# Patient Record
Sex: Female | Born: 1960 | Race: White | Hispanic: No | Smoking: Former smoker
Health system: Southern US, Community
[De-identification: ages and names within clinical notes are randomized; demographics above are authoritative.]

## PROBLEM LIST (undated history)

## (undated) DIAGNOSIS — R490 Dysphonia: Secondary | ICD-10-CM

## (undated) DIAGNOSIS — M1990 Unspecified osteoarthritis, unspecified site: Secondary | ICD-10-CM

## (undated) DIAGNOSIS — T7840XA Allergy, unspecified, initial encounter: Secondary | ICD-10-CM

## (undated) DIAGNOSIS — E119 Type 2 diabetes mellitus without complications: Secondary | ICD-10-CM

## (undated) DIAGNOSIS — I2510 Atherosclerotic heart disease of native coronary artery without angina pectoris: Secondary | ICD-10-CM

## (undated) DIAGNOSIS — C801 Malignant (primary) neoplasm, unspecified: Secondary | ICD-10-CM

## (undated) DIAGNOSIS — Z972 Presence of dental prosthetic device (complete) (partial): Secondary | ICD-10-CM

## (undated) DIAGNOSIS — M75111 Incomplete rotator cuff tear or rupture of right shoulder, not specified as traumatic: Secondary | ICD-10-CM

## (undated) DIAGNOSIS — I10 Essential (primary) hypertension: Secondary | ICD-10-CM

## (undated) DIAGNOSIS — H269 Unspecified cataract: Secondary | ICD-10-CM

## (undated) HISTORY — DX: Allergy, unspecified, initial encounter: T78.40XA

## (undated) HISTORY — PX: BREAST BIOPSY: SHX20

## (undated) HISTORY — DX: Type 2 diabetes mellitus without complications: E11.9

---
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
## 1979-11-02 HISTORY — PX: TUBAL LIGATION: SHX77

## 1992-11-01 HISTORY — PX: BUNIONECTOMY: SHX129

## 2002-09-26 ENCOUNTER — Other Ambulatory Visit: Admission: RE | Admit: 2002-09-26 | Discharge: 2002-09-26 | Payer: Self-pay | Admitting: Obstetrics and Gynecology

## 2002-11-01 HISTORY — PX: VAGINAL HYSTERECTOMY: SUR661

## 2003-03-05 ENCOUNTER — Observation Stay (HOSPITAL_COMMUNITY): Admission: RE | Admit: 2003-03-05 | Discharge: 2003-03-06 | Payer: Self-pay | Admitting: Obstetrics and Gynecology

## 2003-03-05 ENCOUNTER — Encounter (INDEPENDENT_AMBULATORY_CARE_PROVIDER_SITE_OTHER): Payer: Self-pay | Admitting: Specialist

## 2003-03-05 HISTORY — PX: TOTAL VAGINAL HYSTERECTOMY: SHX2548

## 2003-03-05 HISTORY — PX: PUBOVAGINAL SLING: SHX1035

## 2003-05-29 ENCOUNTER — Encounter: Admission: RE | Admit: 2003-05-29 | Discharge: 2003-05-29 | Payer: Self-pay | Admitting: General Surgery

## 2003-05-29 ENCOUNTER — Encounter (HOSPITAL_BASED_OUTPATIENT_CLINIC_OR_DEPARTMENT_OTHER): Payer: Self-pay | Admitting: General Surgery

## 2003-05-29 ENCOUNTER — Encounter (INDEPENDENT_AMBULATORY_CARE_PROVIDER_SITE_OTHER): Payer: Self-pay | Admitting: *Deleted

## 2006-07-21 ENCOUNTER — Other Ambulatory Visit: Admission: RE | Admit: 2006-07-21 | Discharge: 2006-07-21 | Payer: Self-pay | Admitting: Obstetrics and Gynecology

## 2008-05-22 ENCOUNTER — Ambulatory Visit (HOSPITAL_COMMUNITY): Admission: RE | Admit: 2008-05-22 | Discharge: 2008-05-22 | Payer: Self-pay | Admitting: Family Medicine

## 2008-11-08 ENCOUNTER — Ambulatory Visit (HOSPITAL_COMMUNITY): Admission: RE | Admit: 2008-11-08 | Discharge: 2008-11-08 | Payer: Self-pay | Admitting: Obstetrics and Gynecology

## 2009-09-02 ENCOUNTER — Emergency Department (HOSPITAL_COMMUNITY): Admission: EM | Admit: 2009-09-02 | Discharge: 2009-09-02 | Payer: Self-pay | Admitting: Family Medicine

## 2009-11-17 ENCOUNTER — Ambulatory Visit (HOSPITAL_COMMUNITY): Admission: RE | Admit: 2009-11-17 | Discharge: 2009-11-17 | Payer: Self-pay | Admitting: Obstetrics and Gynecology

## 2010-08-01 ENCOUNTER — Ambulatory Visit (HOSPITAL_COMMUNITY): Admission: RE | Admit: 2010-08-01 | Discharge: 2010-08-01 | Payer: Self-pay | Admitting: Family Medicine

## 2010-11-24 ENCOUNTER — Other Ambulatory Visit (HOSPITAL_COMMUNITY): Payer: Self-pay

## 2010-11-24 ENCOUNTER — Other Ambulatory Visit (HOSPITAL_COMMUNITY): Payer: Self-pay | Admitting: Family Medicine

## 2010-11-24 DIAGNOSIS — Z139 Encounter for screening, unspecified: Secondary | ICD-10-CM

## 2010-11-24 DIAGNOSIS — Z1231 Encounter for screening mammogram for malignant neoplasm of breast: Secondary | ICD-10-CM

## 2010-12-11 ENCOUNTER — Emergency Department (HOSPITAL_COMMUNITY)
Admission: EM | Admit: 2010-12-11 | Discharge: 2010-12-12 | Disposition: A | Discharge status: Home / Self Care | Payer: Self-pay | Attending: Emergency Medicine | Admitting: Emergency Medicine

## 2010-12-11 DIAGNOSIS — L0231 Cutaneous abscess of buttock: Secondary | ICD-10-CM | POA: Insufficient documentation

## 2010-12-11 DIAGNOSIS — I10 Essential (primary) hypertension: Secondary | ICD-10-CM | POA: Insufficient documentation

## 2010-12-11 DIAGNOSIS — M129 Arthropathy, unspecified: Secondary | ICD-10-CM | POA: Insufficient documentation

## 2010-12-16 ENCOUNTER — Ambulatory Visit (HOSPITAL_COMMUNITY)
Admission: RE | Admit: 2010-12-16 | Discharge: 2010-12-16 | Disposition: A | Discharge status: Home / Self Care | Payer: Self-pay | Source: Ambulatory Visit | Attending: Family Medicine | Admitting: Family Medicine

## 2010-12-16 DIAGNOSIS — Z1231 Encounter for screening mammogram for malignant neoplasm of breast: Secondary | ICD-10-CM | POA: Insufficient documentation

## 2011-03-19 NOTE — H&P (Signed)
NAME:  Sonya Walton, Sonya Walton                        ACCOUNT NO.:  [id number]   MEDICAL RECORD NO.:  [id number]                   PATIENT TYPE:  AMB   LOCATION:  SDC                                  FACILITY:  [facility]   PHYSICIAN:  Hal Morales, M.D.             DATE OF BIRTH:  04-May-1961   DATE OF ADMISSION:  03/05/2003  DATE OF DISCHARGE:                                HISTORY & PHYSICAL   HISTORY OF PRESENT ILLNESS:  The patient is a 50 year old Native American  female, para 2-0-0-2, with a history of uterine fibroids, who presents for a  total vaginal hysterectomy and placement of a pubovaginal sling because of  menorrhagia, cystocele, and stress urinary incontinence.  For over four  years, the patient states that her menstrual flow has range from 8-12 days  in which she uses a Super Plus Tampon every hour.  She admits to menstrual  cramping, which she rates as a 5/10 on a 10-point scale, but states this is  relieved quite well with Extra Strength Tylenol.  She denies any  dyspareunia, back pain, nausea, vomiting, diarrhea, or fever during these  episodes.  In November of 2003, the patient was found to have a normal TSH  and in December of 2003 a normal endometrial biopsy.  Pelvic ultrasound in  December of 2003 showed an increase in her fundal fibroid size to 4.3 x 4.6  x 5.1 cm.  Otherwise the scan was within normal limits.  In addition,  approximately three years ago the patient noticed leaking of urine whenever  she coughed, sneezed, or laughed.  Discussion was held regarding management  options for this condition to include bladder neck suspension.  The patient  was seen by a urologist, Lindaann Slough, M.D., on February 06, 2003, who  concurred with her diagnosis of a grade 2 cystocele and the probable benefit  from bladder neck suspension.  Consequently the patient has consented to  undergo total vaginal hysterectomy and bladder neck suspension using a  pubovaginal sling.   PAST  OBSTETRICAL HISTORY:  Gravida 2, para 2-0-0-2.   PAST GYNECOLOGICAL HISTORY:  Menarche at 50 years old.  For menstrual cycle,  see the history of present illness.  The patient uses bilateral tubal  ligation as a method of contraception.  The patient has a remote history of  Trichomonas and a remote history of an abnormal Pap smear which upon being  repeated has continued to be normal.  Her last normal Pap smear was in  November of 2003.  The patient had a mammogram in December of 2003 which  showed a left axillary node which appeared mildly reactive.  The patient is  scheduled for follow-up in six months.   PAST MEDICAL HISTORY:  Positive for anemia and bronchitis.   FAMILY HISTORY:  The patient had a sister who developed breast cancer in her  42s.  Asthma, lupus, hypertension, migraines, and noninsulin-dependent  diabetes.   PAST SURGICAL HISTORY:  1. The patient had removal of a right ear cyst in 1974.  2. Tonsillectomy in 1977.  3. Bilateral bunionectomy with removal of a bone spur in 1994.  4. Bilateral tubal ligation in 1981.   She denies any history of blood transfusions or difficulty with anesthesia.   SOCIAL HISTORY:  The patient is married and she works for [employer].   HABITS:  The patient smokes one packs of cigarettes per day.  She denies any  use of alcohol.   CURRENT MEDICATIONS:  None.   ALLERGIES:  None known, however, the patient reports that she is very  sensitive to [facility] which causes an anxiety reaction along with  palpitations.   REVIEW OF SYSTEMS:  As per history of present illness, otherwise negative.   PHYSICAL EXAMINATION:  VITAL SIGNS:  Blood pressure 120/80.  WEIGHT:  160 pounds.  HEIGHT:  5 feet 2 inches tall.  NECK:  Supple.  The patient does have what appears to be diffuse enlargement  of her thyroid without nodularity.  There is no cervical adenopathy.  HEART:  Regular rate and rhythm.  There is no murmur.  LUNGS:  Clear to  auscultation.  There are no wheezing, rhonchi, or rales.  BACK:  No CVA tenderness.  ABDOMEN:  Bowel sounds are present.  It is soft and nontender.  There is no  organomegaly.  EXTREMITIES:  Without cyanosis, clubbing, or edema.  PELVIC:  EG and BUS are within normal limits.  Vagina with a small  cystocele.  The cervix is without tenderness or lesions.  The uterus is 10-  12 weeks size without tenderness.  Adnexa with no tenderness or masses.  The  rectovaginal exam confirms.   IMPRESSION:  1. Fibroid uterus.  2. Menorrhagia.  3. Cystocele.  4. Stress urinary incontinence.   DISPOSITION:  A discussion was held with the patient regarding the medical  and surgical management options available for her presenting symptoms.  After review of these options, the patient has chosen definitive therapy for  her menorrhagia in the form of total vaginal hysterectomy.  She also wishes  to undergo the placement of a pubovaginal sling as a means of managing her  stress urinary incontinence.  The patient understands the implications for  her procedure along with it's risks, which include, but are not limited to  reaction to anesthesia, damage to adjacent organs, bleeding, infection, and  the possibility of having to remove her uterus through an abdominal  incision.  The patient has consented to undergo a total vaginal hysterectomy  with the possibility of a total abdominal hysterectomy along with placement  of a pubovaginal sling at [facility] on Mar 05, 2003, at  9:15 a.m.     Elmira J. Adline Peals.                    Hal Morales, M.D.    EJP/MEDQ  D:  02/28/2003  T:  02/28/2003  Job:  [id number]

## 2011-03-19 NOTE — Op Note (Signed)
NAMEMARALYN, Sonya Walton                        ACCOUNT NO.:  [id number]   MEDICAL RECORD NO.:  [id number]                   PATIENT TYPE:  OBV   LOCATION:  9326                                 FACILITY:  [facility]   PHYSICIAN:  Lindaann Slough, M.D.               DATE OF BIRTH:  Jul 02, 1961   DATE OF PROCEDURE:  03/05/2003  DATE OF DISCHARGE:                                 OPERATIVE REPORT   PREOPERATIVE DIAGNOSES:  Stress incontinence.   POSTOPERATIVE DIAGNOSES:  Stress incontinence.   PROCEDURE:  SPARC procedure.   SURGEON:  Lindaann Slough, M.D.   ANESTHESIA:  General.   INDICATIONS:  The patient is a 50 year old female who has been complaining  of stress urinary incontinence for the past three or four years.  The  incontinence has been worse in the past several months.  She was found on  examination to have a moderate cystocele.  She is scheduled for vaginal  hysterectomy and she was advised to have a pubovaginal sling.  The risks,  benefits, and alternatives to the procedure were discussed with the patient  and she understands and is agreeable.   PROCEDURE:  Under general anesthesia patient was prepped and draped and  placed in the dorsal lithotomy position.  A vaginal hysterectomy was  completed by Hal Morales, M.D..  Then, the vaginal mucosa was  infiltrated with 1% lidocaine with epinephrine.  A 2 cm incision was then  made about 1 cm from the urethral meatus.  The incision was carried down to  the subcutaneous tissues and the vaginal mucosa was separated from the  periurethral tissues.  Then, two small incisions were made in the suprapubic  area slightly above symphysis pubis.  Each incision was made about 2 cm from  the midline.  Then, the [facility] needle was passed through the right suprapubic  incision and the needle was passed behind the symphysis pubis while hugging  the symphysis into the vaginal incision.  The same procedure was done on the  left side.  Foley  catheter that was previously placed in the bladder was  then removed.  A number 22 cystoscope was inserted in the bladder.  The  bladder mucosa is normal.  There is no stone or tumor in the bladder.  The  ureteral orifices are in normal position and shape with clear efflux.  There  is no evidence of bladder injury with SPARC needles.  The cystoscope was  then removed.  The Foley catheter was then reinserted in the bladder and  mesh was attached to the [facility] needles and pulled through the suprapubic  incision.  The ends of mesh were then cut and the plastic sheath that was  covering the mesh was removed.  A needle holder was then passed between the  urethra in the mesh.  The Foley catheter was then removed.  A cystoscope was  then reinserted in the bladder.  There is no evidence of erosion of the mesh  into the bladder.  The cystoscope was then removed.  The mesh was then cut  at the skin level with the needle holder still in position between the  urethra and the mesh.  Then the vaginal incision was closed with number 2-0  Vicryl and the suprapubic incisions were  closed with 4-0 Monocryl.  The Foley catheter was then reinserted in the  bladder.  A vaginal packing with Estrace cream was placed in the vagina.   The patient tolerated procedure well and left the OR in satisfactory  condition to postanesthesia care unit.                                               Lindaann Slough, M.D.    MN/MEDQ  D:  03/05/2003  T:  03/05/2003  Job:  [id number]   cc:   Hal Morales, M.D.  [address], [address]  [place]  [place] [address]  Fax: [phone]

## 2011-03-19 NOTE — Op Note (Signed)
Sonya Walton, Sonya Walton                        ACCOUNT NO.:  [id number]   MEDICAL RECORD NO.:  [id number]                   PATIENT TYPE:  OBV   LOCATION:  9326                                 [facility]ILITY[facility]   PHYSICIAN:  Hal Morales, M.D.             DATE OF BIRTH:  July 15, 1961   DATE OF PROCEDURE:  03/05/2003  DATE OF DISCHARGE:                                 OPERATIVE REPORT   PREOPERATIVE DIAGNOSES:  1. Uterine fibroids.  2. Menorrhagia with resultant anemia.  3. Asymptomatic cystocele.  4. Stress urinary incontinence.   POSTOPERATIVE DIAGNOSES:  1. Uterine fibroids.  2. Menorrhagia with resultant anemia.  3. Asymptomatic cystocele.  4. Stress urinary incontinence.   OPERATION:  Total vaginal hysterectomy with placement of pubovaginal sling.   SURGEON:  Hal Morales, M.D.   FIRST ASSISTANT:  Elmira J. Lowell Guitar, P.A. for the total vaginal hysterectomy  and Lindaann Slough, M.D. for the pubovaginal sling.   ANESTHESIA:  General orotracheal.   ESTIMATED BLOOD LOSS:  250 mL for vaginal hysterectomy.   FINDINGS:  The uterus was enlarged to approximately 12 weeks size and  weighed 256 g.  There was a small cystocele.  The tubes and ovaries appeared  within normal limits except for interruption for tubal sterilization.   PROCEDURE:  The patient was taken to the operating room after appropriate  identification and placed on the operating table.  After the attainment of  adequate general anesthesia she was placed in the lithotomy position.  The  abdomen, perineum, and vagina were prepped with multiple layers of Betadine  and a Foley catheter inserted into the bladder and connected to straight  drainage.  The perineum was draped as a sterile field.  A weighted speculum  was placed in the posterior vagina and a Lahey tenaculum placed on the  anterior lip of the cervix and on the posterior lip of the cervix.  The  cervical vaginal mucosa was injected with a dilute  solution of Pitressin and  circumscribed.  The anterior vaginal mucosa was bluntly dissected off the  anterior cervix and the posterior peritoneum entered sharply.  The  uterosacral ligaments on the right and left side were clamped, cut, and  suture ligated.  The paracervical tissues and cardinal ligaments were  clamped, cut, and suture ligated.  The anterior peritoneum was entered.  Uterine arteries were clamped, cut, and suture ligated.  Morcellation of the  uterus was undertaken because of the inability to invert the uterus and this  was done by a coring technique with the removal of a large portion of the  myometrial tissue as well as a large uterine fibroid approximately 5 cm.  Once the morcellation had been completed the uterus was inverted and the  upper pedicles clamped, cut, and suture ligated in two separate portions on  the right and left side.  The upper portion of the uterus was then removed  from the operative field.  Hemostasis was achieved with figure-of-eight  sutures on the right and left side in the pelvic side walls.  A McCall  culdoplasty suture was then placed in the posterior cul-de-sac incorporating  the two uterosacral ligaments and the intervening posterior peritoneum with  that suture held.  The vaginal cuff was then closed using the uterosacral  ligament sutures which had been held as the angled sutures placing them  anteriorly and posteriorly at the vaginal angle on the right and left side  and then tying them down on either side.  The remainder of the vaginal cuff  was closed in a single layer using figure-of-eight sutures to incorporate  the anterior vaginal mucosa, anterior peritoneum, posterior peritoneum, and  posterior vaginal mucosa in a figure-of-eight fashion.  Once the entire  vaginal cuff had been closed hemostasis was noted to be adequate and the  McCall culdoplasty suture was tied down.  All sutures used in this procedure  were 0 Vicryl.  Once  hemostasis was noted Lindaann Slough, M.D. entered the  operating room to perform the pubovaginal sling and that will be dictated  under a separate operative report.  It is expected that the patient will  have a 2 inch packing moistened with Estrace cream once that procedure is  complete.   SPECIMENS:  Morcellated uterus and cervix.                                               Hal Morales, M.D.    VPH/MEDQ  D:  03/05/2003  T:  03/05/2003  Job:  [id number]

## 2011-06-16 ENCOUNTER — Other Ambulatory Visit (HOSPITAL_COMMUNITY): Payer: Self-pay | Admitting: Family Medicine

## 2011-06-16 ENCOUNTER — Ambulatory Visit (HOSPITAL_COMMUNITY)
Admission: RE | Admit: 2011-06-16 | Discharge: 2011-06-16 | Disposition: A | Discharge status: Home / Self Care | Payer: Self-pay | Source: Ambulatory Visit | Attending: Family Medicine | Admitting: Family Medicine

## 2011-06-16 DIAGNOSIS — R0602 Shortness of breath: Secondary | ICD-10-CM | POA: Insufficient documentation

## 2011-06-16 DIAGNOSIS — R52 Pain, unspecified: Secondary | ICD-10-CM

## 2011-06-16 DIAGNOSIS — F17200 Nicotine dependence, unspecified, uncomplicated: Secondary | ICD-10-CM | POA: Insufficient documentation

## 2011-06-16 DIAGNOSIS — R079 Chest pain, unspecified: Secondary | ICD-10-CM | POA: Insufficient documentation

## 2011-07-02 ENCOUNTER — Inpatient Hospital Stay (INDEPENDENT_AMBULATORY_CARE_PROVIDER_SITE_OTHER)
Admission: RE | Admit: 2011-07-02 | Discharge: 2011-07-02 | Disposition: A | Discharge status: Home / Self Care | Payer: Self-pay | Source: Ambulatory Visit | Attending: Family Medicine | Admitting: Family Medicine

## 2011-07-02 DIAGNOSIS — J40 Bronchitis, not specified as acute or chronic: Secondary | ICD-10-CM

## 2011-07-13 ENCOUNTER — Other Ambulatory Visit (HOSPITAL_COMMUNITY): Payer: Self-pay | Admitting: Family Medicine

## 2011-07-13 ENCOUNTER — Ambulatory Visit (HOSPITAL_COMMUNITY)
Admission: RE | Admit: 2011-07-13 | Discharge: 2011-07-13 | Disposition: A | Discharge status: Home / Self Care | Payer: Self-pay | Source: Ambulatory Visit | Attending: Family Medicine | Admitting: Family Medicine

## 2011-07-13 DIAGNOSIS — R52 Pain, unspecified: Secondary | ICD-10-CM

## 2011-07-13 DIAGNOSIS — M7730 Calcaneal spur, unspecified foot: Secondary | ICD-10-CM | POA: Insufficient documentation

## 2011-07-13 DIAGNOSIS — M25579 Pain in unspecified ankle and joints of unspecified foot: Secondary | ICD-10-CM | POA: Insufficient documentation

## 2011-10-15 ENCOUNTER — Telehealth (HOSPITAL_COMMUNITY): Payer: Self-pay | Admitting: *Deleted

## 2011-10-16 ENCOUNTER — Emergency Department (INDEPENDENT_AMBULATORY_CARE_PROVIDER_SITE_OTHER)
Admission: EM | Admit: 2011-10-16 | Discharge: 2011-10-16 | Disposition: A | Discharge status: Home / Self Care | Payer: Self-pay | Source: Home / Self Care | Attending: Family Medicine | Admitting: Family Medicine

## 2011-10-16 ENCOUNTER — Encounter: Payer: Self-pay | Admitting: Emergency Medicine

## 2011-10-16 DIAGNOSIS — J069 Acute upper respiratory infection, unspecified: Secondary | ICD-10-CM

## 2011-10-16 DIAGNOSIS — J0100 Acute maxillary sinusitis, unspecified: Secondary | ICD-10-CM

## 2011-10-16 HISTORY — DX: Unspecified osteoarthritis, unspecified site: M19.90

## 2011-10-16 HISTORY — DX: Essential (primary) hypertension: I10

## 2011-10-16 MED ORDER — AMOXICILLIN-POT CLAVULANATE 875-125 MG PO TABS: 1.0000 | ORAL_TABLET | Freq: Two times a day (BID) | ORAL | Status: AC

## 2011-10-16 MED ORDER — FEXOFENADINE-PSEUDOEPHED ER 180-240 MG PO TB24: 1.0000 | ORAL_TABLET | Freq: Every day | ORAL | Status: DC

## 2011-10-16 MED ORDER — FLUTICASONE PROPIONATE 50 MCG/ACT NA SUSP: 2.0000 | Freq: Every day | NASAL | Status: DC

## 2011-10-16 NOTE — ED Provider Notes (Signed)
History     CSN: [id number] Arrival date & time: 10/16/2011  9:30 AM   First MD Initiated Contact with Patient 10/16/11 1021      Chief Complaint  Patient presents with  . URI  . Sinusitis    (Consider location/radiation/quality/duration/timing/severity/associated sxs/prior treatment) Patient is a 50 y.o. female presenting with sinusitis. The history is provided by the patient.  Sinusitis  This is a new problem. The current episode started more than 2 days ago. The problem has been gradually worsening. There has been no fever. Associated symptoms include congestion, sinus pressure, sore throat and cough.    Past Medical History  Diagnosis Date  . Bronchitis   . Hypertension   . Arthritis     Past Surgical History  Procedure Date  . Partial hysterectomy     Family History  Problem Relation Age of Onset  . Hypertension Other   . Diabetes Other   . Asthma Other     History  Substance Use Topics  . Smoking status: Current Everyday Smoker  . Smokeless tobacco: Not on file  . Alcohol Use: No    OB History    Grav Para Term Preterm Abortions TAB SAB Ect Mult Living                  Review of Systems  Constitutional: Positive for fatigue. Negative for fever.  HENT: Positive for congestion, sore throat and sinus pressure.   Respiratory: Positive for cough.   Musculoskeletal: Positive for arthralgias. Negative for joint swelling.  All other systems reviewed and are negative.    Allergies  Review of patient's allergies indicates no known allergies.  Home Medications   Current Outpatient Rx  Name Route Sig Dispense Refill  . ATENOLOL-CHLORTHALIDONE 50-25 MG PO TABS Oral Take 1 tablet by mouth daily.      Marland Kitchen VITAMIN D 1000 UNITS PO TABS Oral Take 2,000 Units by mouth daily.        BP 127/83  Pulse 78  Temp(Src) 97.8 F (36.6 C) (Tympanic)  Resp 20  SpO2 98%  Physical Exam  Nursing note and vitals reviewed. Constitutional: She is oriented to person,  place, and time. She appears well-developed and well-nourished. No distress.  HENT:  Head: Normocephalic and atraumatic.  Right Ear: Tympanic membrane, external ear and ear canal normal.  Left Ear: Tympanic membrane, external ear and ear canal normal.  Nose: Mucosal edema and rhinorrhea present. Right sinus exhibits maxillary sinus tenderness. Left sinus exhibits maxillary sinus tenderness.  Mouth/Throat: Oropharynx is clear and moist. No oral lesions. No oropharyngeal exudate.  Eyes: Right eye exhibits no discharge. Left eye exhibits no discharge. No scleral icterus.  Neck: Neck supple.  Cardiovascular: Normal rate, regular rhythm and normal heart sounds.   Pulmonary/Chest: Effort normal and breath sounds normal. She has no wheezes. She has no rales.  Lymphadenopathy:    She has no cervical adenopathy.  Neurological: She is alert and oriented to person, place, and time.  Skin: Skin is warm and dry.    ED Course  Procedures (including critical care time)  Labs Reviewed - No data to display No results found.   No diagnosis found.    MDM          Hassan Rowan, MD 10/16/11 2127

## 2011-10-16 NOTE — ED Notes (Signed)
Pt here with uri/sinusitis sx that started Wednesday with sore throat,runny nose and has progressed with constant dry cough,and feeling fatigue.no fevers reported.pt ahs hx bronchitis.sats 98% r/a

## 2011-11-16 ENCOUNTER — Other Ambulatory Visit (HOSPITAL_COMMUNITY): Payer: Self-pay | Admitting: Physician Assistant

## 2011-11-16 DIAGNOSIS — Z1231 Encounter for screening mammogram for malignant neoplasm of breast: Secondary | ICD-10-CM

## 2011-12-20 ENCOUNTER — Ambulatory Visit (HOSPITAL_COMMUNITY): Payer: Self-pay

## 2011-12-31 ENCOUNTER — Encounter (HOSPITAL_COMMUNITY): Payer: Self-pay | Admitting: *Deleted

## 2011-12-31 ENCOUNTER — Other Ambulatory Visit: Payer: Self-pay

## 2011-12-31 ENCOUNTER — Observation Stay (HOSPITAL_COMMUNITY)
Admission: EM | Admit: 2011-12-31 | Discharge: 2012-01-01 | Disposition: A | Discharge status: Home / Self Care | Payer: Self-pay | Attending: Internal Medicine | Admitting: Internal Medicine

## 2011-12-31 ENCOUNTER — Emergency Department (HOSPITAL_COMMUNITY): Payer: Self-pay

## 2011-12-31 DIAGNOSIS — R7309 Other abnormal glucose: Secondary | ICD-10-CM | POA: Insufficient documentation

## 2011-12-31 DIAGNOSIS — R079 Chest pain, unspecified: Principal | ICD-10-CM | POA: Insufficient documentation

## 2011-12-31 DIAGNOSIS — M129 Arthropathy, unspecified: Secondary | ICD-10-CM | POA: Insufficient documentation

## 2011-12-31 DIAGNOSIS — Z87891 Personal history of nicotine dependence: Secondary | ICD-10-CM | POA: Diagnosis present

## 2011-12-31 DIAGNOSIS — E876 Hypokalemia: Secondary | ICD-10-CM | POA: Insufficient documentation

## 2011-12-31 DIAGNOSIS — F17200 Nicotine dependence, unspecified, uncomplicated: Secondary | ICD-10-CM | POA: Insufficient documentation

## 2011-12-31 DIAGNOSIS — M1990 Unspecified osteoarthritis, unspecified site: Secondary | ICD-10-CM | POA: Diagnosis present

## 2011-12-31 DIAGNOSIS — I10 Essential (primary) hypertension: Secondary | ICD-10-CM | POA: Insufficient documentation

## 2011-12-31 LAB — CARDIAC PANEL(CRET KIN+CKTOT+MB+TROPI)
CK, MB: 3.3 ng/mL (ref 0.3–4.0)
CK, MB: 3.1 ng/mL (ref 0.3–4.0)
Relative Index: 2.5 (ref 0.0–2.5)
Relative Index: 2.5 (ref 0.0–2.5)
Total CK: 132 U/L (ref 7–177)
Total CK: 122 U/L (ref 7–177)
Troponin I: <0.3 ng/mL (ref <0.30)
Troponin I: <0.3 ng/mL (ref <0.30)

## 2011-12-31 LAB — CBC
HCT: 45 % (ref 36.0–46.0)
HCT: 43.3 % (ref 36.0–46.0)
Hemoglobin: 16 g/dL — ABNORMAL HIGH (ref 12.0–15.0)
Hemoglobin: 15 g/dL (ref 12.0–15.0)
MCH: 30.7 pg (ref 26.0–34.0)
MCH: 30.1 pg (ref 26.0–34.0)
MCHC: 35.6 g/dL (ref 30.0–36.0)
MCHC: 34.6 g/dL (ref 30.0–36.0)
MCV: 86.2 fL (ref 78.0–100.0)
MCV: 86.8 fL (ref 78.0–100.0)
Platelets: 323 10*3/uL (ref 150–400)
Platelets: 324 10*3/uL (ref 150–400)
RBC: 5.22 MIL/uL — ABNORMAL HIGH (ref 3.87–5.11)
RBC: 4.99 MIL/uL (ref 3.87–5.11)
RDW: 14.5 % (ref 11.5–15.5)
RDW: 14.7 % (ref 11.5–15.5)
WBC: 10.7 10*3/uL — ABNORMAL HIGH (ref 4.0–10.5)
WBC: 10.6 10*3/uL — ABNORMAL HIGH (ref 4.0–10.5)

## 2011-12-31 LAB — POCT I-STAT, CHEM 8
BUN: 9 mg/dL (ref 6–23)
Calcium, Ion: 1.15 mmol/L (ref 1.12–1.32)
Chloride: 103 mEq/L (ref 96–112)
Creatinine, Ser: 0.8 mg/dL (ref 0.50–1.10)
Glucose, Bld: 138 mg/dL — ABNORMAL HIGH (ref 70–99)
HCT: 50 % — ABNORMAL HIGH (ref 36.0–46.0)
Hemoglobin: 17 g/dL — ABNORMAL HIGH (ref 12.0–15.0)
Potassium: 3.1 mEq/L — ABNORMAL LOW (ref 3.5–5.1)
Sodium: 141 mEq/L (ref 135–145)
TCO2: 26 mmol/L (ref 0–100)

## 2011-12-31 LAB — COMPREHENSIVE METABOLIC PANEL
ALT: 45 U/L — ABNORMAL HIGH (ref 0–35)
AST: 32 U/L (ref 0–37)
Albumin: 3.7 g/dL (ref 3.5–5.2)
Alkaline Phosphatase: 82 U/L (ref 39–117)
BUN: 10 mg/dL (ref 6–23)
CO2: 25 mEq/L (ref 19–32)
Calcium: 9.8 mg/dL (ref 8.4–10.5)
Chloride: 102 mEq/L (ref 96–112)
Creatinine, Ser: 0.67 mg/dL (ref 0.50–1.10)
GFR calc Af Amer: >90 mL/min (ref >90)
GFR calc non Af Amer: >90 mL/min (ref >90)
Glucose, Bld: 140 mg/dL — ABNORMAL HIGH (ref 70–99)
Potassium: 3.2 mEq/L — ABNORMAL LOW (ref 3.5–5.1)
Sodium: 138 mEq/L (ref 135–145)
Total Bilirubin: 0.3 mg/dL (ref 0.3–1.2)
Total Protein: 7.3 g/dL (ref 6.0–8.3)

## 2011-12-31 LAB — DIFFERENTIAL
Basophils Absolute: 0 10*3/uL (ref 0.0–0.1)
Basophils Relative: 0 % (ref 0–1)
Eosinophils Absolute: 0.2 10*3/uL (ref 0.0–0.7)
Eosinophils Relative: 2 % (ref 0–5)
Lymphocytes Relative: 32 % (ref 12–46)
Lymphs Abs: 3.4 10*3/uL (ref 0.7–4.0)
Monocytes Absolute: 0.8 10*3/uL (ref 0.1–1.0)
Monocytes Relative: 8 % (ref 3–12)
Neutro Abs: 6.3 10*3/uL (ref 1.7–7.7)
Neutrophils Relative %: 58 % (ref 43–77)

## 2011-12-31 LAB — CK TOTAL AND CKMB (NOT AT ARMC)
CK, MB: 3.6 ng/mL (ref 0.3–4.0)
Relative Index: 2.3 (ref 0.0–2.5)
Total CK: 155 U/L (ref 7–177)

## 2011-12-31 LAB — PROTIME-INR
INR: 0.96 (ref 0.00–1.49)
Prothrombin Time: 13 seconds (ref 11.6–15.2)

## 2011-12-31 LAB — CREATININE, SERUM
Creatinine, Ser: 0.71 mg/dL (ref 0.50–1.10)
GFR calc Af Amer: >90 mL/min (ref >90)
GFR calc non Af Amer: >90 mL/min (ref >90)

## 2011-12-31 LAB — APTT: aPTT: 31 seconds (ref 24–37)

## 2011-12-31 LAB — POCT I-STAT TROPONIN I: Troponin i, poc: 0 ng/mL (ref 0.00–0.08)

## 2011-12-31 MED ORDER — ASPIRIN EC 325 MG PO TBEC
325.0000 mg | DELAYED_RELEASE_TABLET | Freq: Every day | ORAL | Status: DC
  Administered 2011-12-31: 325 mg via ORAL
  Filled 2011-12-31 (×2): qty 1

## 2011-12-31 MED ORDER — ONDANSETRON HCL 4 MG/2ML IJ SOLN: 4.0000 mg | Freq: Four times a day (QID) | INTRAMUSCULAR | Status: DC | PRN

## 2011-12-31 MED ORDER — ONDANSETRON HCL 4 MG PO TABS: 4.0000 mg | ORAL_TABLET | Freq: Four times a day (QID) | ORAL | Status: DC | PRN

## 2011-12-31 MED ORDER — NITROGLYCERIN 0.4 MG SL SUBL
0.4000 mg | SUBLINGUAL_TABLET | SUBLINGUAL | Status: DC | PRN
  Administered 2011-12-31: 0.4 mg via SUBLINGUAL
  Filled 2011-12-31: qty 25

## 2011-12-31 MED ORDER — ACETAMINOPHEN 325 MG PO TABS
650.0000 mg | ORAL_TABLET | Freq: Four times a day (QID) | ORAL | Status: DC | PRN
  Administered 2011-12-31 – 2012-01-01 (×3): 650 mg via ORAL
  Filled 2011-12-31 (×3): qty 2

## 2011-12-31 MED ORDER — ENOXAPARIN SODIUM 40 MG/0.4ML ~~LOC~~ SOLN
40.0000 mg | SUBCUTANEOUS | Status: DC
  Administered 2011-12-31: 40 mg via SUBCUTANEOUS
  Filled 2011-12-31 (×2): qty 0.4

## 2011-12-31 MED ORDER — ASPIRIN 81 MG PO CHEW
324.0000 mg | CHEWABLE_TABLET | Freq: Once | ORAL | Status: AC
  Administered 2011-12-31: 324 mg via ORAL
  Filled 2011-12-31: qty 4

## 2011-12-31 MED ORDER — SODIUM CHLORIDE 0.9 % IJ SOLN
3.0000 mL | Freq: Two times a day (BID) | INTRAMUSCULAR | Status: DC
  Administered 2011-12-31: 3 mL via INTRAVENOUS

## 2011-12-31 MED ORDER — ADULT MULTIVITAMIN W/MINERALS CH
1.0000 | ORAL_TABLET | Freq: Every day | ORAL | Status: DC
  Administered 2011-12-31: 1 via ORAL
  Filled 2011-12-31 (×2): qty 1

## 2011-12-31 MED ORDER — PANTOPRAZOLE SODIUM 40 MG PO TBEC
40.0000 mg | DELAYED_RELEASE_TABLET | Freq: Every day | ORAL | Status: DC
  Filled 2011-12-31: qty 1

## 2011-12-31 MED ORDER — ATENOLOL-CHLORTHALIDONE 50-25 MG PO TABS: 1.0000 | ORAL_TABLET | Freq: Every day | ORAL | Status: DC

## 2011-12-31 MED ORDER — POTASSIUM CHLORIDE CRYS ER 20 MEQ PO TBCR
20.0000 meq | EXTENDED_RELEASE_TABLET | Freq: Two times a day (BID) | ORAL | Status: DC
  Administered 2011-12-31 (×2): 20 meq via ORAL
  Filled 2011-12-31 (×4): qty 1

## 2011-12-31 MED ORDER — HYDROCHLOROTHIAZIDE 25 MG PO TABS
25.0000 mg | ORAL_TABLET | Freq: Every day | ORAL | Status: DC
  Administered 2011-12-31: 25 mg via ORAL
  Filled 2011-12-31 (×2): qty 1

## 2011-12-31 MED ORDER — SODIUM CHLORIDE 0.9 % IV SOLN
Freq: Once | INTRAVENOUS | Status: AC
  Administered 2011-12-31: 04:00:00 via INTRAVENOUS

## 2011-12-31 MED ORDER — SODIUM CHLORIDE 0.9 % IJ SOLN: 3.0000 mL | Freq: Two times a day (BID) | INTRAMUSCULAR | Status: DC

## 2011-12-31 MED ORDER — ATENOLOL 50 MG PO TABS
50.0000 mg | ORAL_TABLET | Freq: Every day | ORAL | Status: DC
  Administered 2011-12-31: 50 mg via ORAL
  Filled 2011-12-31 (×2): qty 1

## 2011-12-31 MED ORDER — ACETAMINOPHEN 650 MG RE SUPP: 650.0000 mg | Freq: Four times a day (QID) | RECTAL | Status: DC | PRN

## 2011-12-31 NOTE — H&P (Signed)
Sonya Walton is an 51 y.o. female.   PCP - [facility]. Chief Complaint: Chest pain. HPI: Here for history of hypertension ongoing tobacco abuse and arthritis has experienced a sudden onset of chest pain last midnight while sleeping which woke her from sleep. Pain is retrosternal radiating a little to the left. Patient's chest pain improved after she received nitroglycerin sublingual. Patient denies any associated shortness of breath or exertional symptoms. She states she's been having recent of chest pain off and on for a few days now. Denies any cough phlegm or fever chills. The origins and 7... if it is/are negative EKG shows nonspecific changes with RBBB. Patient at this time is chest pain-free and will be admitted for further workup.  Past Medical History  Diagnosis Date  . Bronchitis   . Hypertension   . Arthritis     Past Surgical History  Procedure Date  . Partial hysterectomy   . Abdominal hysterectomy     Family History  Problem Relation Age of Onset  . Hypertension Other   . Diabetes Other   . Asthma Other   . Liver cancer Father   . Diabetes type II Sister   . Diabetes type II Brother    Social History:  reports that she has been smoking.  She does not have any smokeless tobacco history on file. She reports that she does not drink alcohol or use illicit drugs.  Allergies: No Known Allergies  Medications Prior to Admission  Medication Dose Route Frequency Provider Last Rate Last Dose  . 0.9 %  sodium chloride infusion   Intravenous Once Vida Roller, MD 75 mL/hr at 12/31/11 0407    . aspirin chewable tablet 324 mg  324 mg Oral Once Vida Roller, MD   324 mg at 12/31/11 0415  . nitroGLYCERIN (NITROSTAT) SL tablet 0.4 mg  0.4 mg Sublingual Q5 Min x 3 PRN Vida Roller, MD   0.4 mg at 12/31/11 0415   Medications Prior to Admission  Medication Sig Dispense Refill  . atenolol-chlorthalidone (TENORETIC) 50-25 MG per tablet Take 1 tablet by mouth daily.           Results for orders placed during the hospital encounter of 12/31/11 (from the past 48 hour(s))  CBC     Status: Abnormal   Collection Time   12/31/11  3:31 AM      Component Value Range Comment   WBC 10.7 (*) 4.0 - 10.5 (K/uL)    RBC 5.22 (*) 3.87 - 5.11 (MIL/uL)    Hemoglobin 16.0 (*) 12.0 - 15.0 (g/dL)    HCT 16.1  09.6 - 04.5 (%)    MCV 86.2  78.0 - 100.0 (fL)    MCH 30.7  26.0 - 34.0 (pg)    MCHC 35.6  30.0 - 36.0 (g/dL)    RDW 40.9  81.1 - 91.4 (%)    Platelets 323  150 - 400 (K/uL)   DIFFERENTIAL     Status: Normal   Collection Time   12/31/11  3:31 AM      Component Value Range Comment   Neutrophils Relative 58  43 - 77 (%)    Neutro Abs 6.3  1.7 - 7.7 (K/uL)    Lymphocytes Relative 32  12 - 46 (%)    Lymphs Abs 3.4  0.7 - 4.0 (K/uL)    Monocytes Relative 8  3 - 12 (%)    Monocytes Absolute 0.8  0.1 - 1.0 (K/uL)  Eosinophils Relative 2  0 - 5 (%)    Eosinophils Absolute 0.2  0.0 - 0.7 (K/uL)    Basophils Relative 0  0 - 1 (%)    Basophils Absolute 0.0  0.0 - 0.1 (K/uL)   COMPREHENSIVE METABOLIC PANEL     Status: Abnormal   Collection Time   12/31/11  3:31 AM      Component Value Range Comment   Sodium 138  135 - 145 (mEq/L)    Potassium 3.2 (*) 3.5 - 5.1 (mEq/L)    Chloride 102  96 - 112 (mEq/L)    CO2 25  19 - 32 (mEq/L)    Glucose, Bld 140 (*) 70 - 99 (mg/dL)    BUN 10  6 - 23 (mg/dL)    Creatinine, Ser 1.61  0.50 - 1.10 (mg/dL)    Calcium 9.8  8.4 - 10.5 (mg/dL)    Total Protein 7.3  6.0 - 8.3 (g/dL)    Albumin 3.7  3.5 - 5.2 (g/dL)    AST 32  0 - 37 (U/L)    ALT 45 (*) 0 - 35 (U/L)    Alkaline Phosphatase 82  39 - 117 (U/L)    Total Bilirubin 0.3  0.3 - 1.2 (mg/dL)    GFR calc non Af Amer >90  >90 (mL/min)    GFR calc Af Amer >90  >90 (mL/min)   CK TOTAL AND CKMB     Status: Normal   Collection Time   12/31/11  3:32 AM      Component Value Range Comment   Total CK 155  7 - 177 (U/L)    CK, MB 3.6  0.3 - 4.0 (ng/mL)    Relative Index 2.3  0.0 - 2.5     POCT I-STAT TROPONIN I     Status: Normal   Collection Time   12/31/11  3:47 AM      Component Value Range Comment   Troponin i, poc 0.00  0.00 - 0.08 (ng/mL)    Comment 3            APTT     Status: Normal   Collection Time   12/31/11  4:06 AM      Component Value Range Comment   aPTT 31  24 - 37 (seconds)   PROTIME-INR     Status: Normal   Collection Time   12/31/11  4:06 AM      Component Value Range Comment   Prothrombin Time 13.0  11.6 - 15.2 (seconds)    INR 0.96  0.00 - 1.49    POCT I-STAT, CHEM 8     Status: Abnormal   Collection Time   12/31/11  4:12 AM      Component Value Range Comment   Sodium 141  135 - 145 (mEq/L)    Potassium 3.1 (*) 3.5 - 5.1 (mEq/L)    Chloride 103  96 - 112 (mEq/L)    BUN 9  6 - 23 (mg/dL)    Creatinine, Ser 0.96  0.50 - 1.10 (mg/dL)    Glucose, Bld 045 (*) 70 - 99 (mg/dL)    Calcium, Ion 4.09  1.12 - 1.32 (mmol/L)    TCO2 26  0 - 100 (mmol/L)    Hemoglobin 17.0 (*) 12.0 - 15.0 (g/dL)    HCT 81.1 (*) 91.4 - 46.0 (%)    Dg Chest Port 1 View  12/31/2011  *RADIOLOGY REPORT*  Clinical Data: Left-sided chest pain and rib pain.  PORTABLE  CHEST - 1 VIEW  Comparison: Chest radiograph performed 06/16/2011  Findings: The lungs are well-aerated and clear.  There is no evidence of focal opacification, pleural effusion or pneumothorax.  The cardiomediastinal silhouette is within normal limits.  No acute osseous abnormalities are seen.  IMPRESSION: No acute cardiopulmonary process seen.  Original Report Authenticated By: Tonia Ghent, M.D.    Review of Systems  Constitutional: Negative.   HENT: Negative.   Eyes: Negative.   Respiratory: Negative.   Cardiovascular: Positive for chest pain.  Gastrointestinal: Negative.   Genitourinary: Negative.   Musculoskeletal: Negative.   Skin: Negative.   Neurological: Negative.   Endo/Heme/Allergies: Negative.   Psychiatric/Behavioral: Negative.     Blood pressure 116/81, pulse 70, temperature 97.7 F (36.5 C),  temperature source Oral, resp. rate 11, SpO2 97.00%. Physical Exam  Constitutional: She is oriented to person, place, and time. She appears well-developed and well-nourished. No distress.  HENT:  Head: Normocephalic and atraumatic.  Right Ear: External ear normal.  Left Ear: External ear normal.  Nose: Nose normal.  Mouth/Throat: Oropharynx is clear and moist. No oropharyngeal exudate.  Eyes: Conjunctivae are normal. Pupils are equal, round, and reactive to light. Right eye exhibits no discharge. Left eye exhibits no discharge. No scleral icterus.  Neck: Normal range of motion. Neck supple.  Cardiovascular: Normal rate, regular rhythm and normal heart sounds.   Respiratory: Effort normal and breath sounds normal. No respiratory distress. She has no wheezes. She has no rales.  GI: Soft. Bowel sounds are normal. She exhibits no distension. There is no tenderness. There is no rebound.  Musculoskeletal: Normal range of motion. She exhibits no edema and no tenderness.  Neurological: She is alert and oriented to person, place, and time.       Moves all extremities.  Skin: Skin is warm and dry. No rash noted. She is not diaphoretic. No erythema.  Psychiatric: Her behavior is normal.     Assessment/Plan #1. Chest pain to rule out ACS - cycle cardiac markers and continued aspirin and nitroglycerin. Check 2-D echo. #2. History of hypertension - continue present medications. #3. Ongoing tobacco abuse - tobacco cessation counseling requested. #4. Arthritis.  CODE STATUS - full code.  Eduard Clos. 12/31/2011, 6:08 AM

## 2011-12-31 NOTE — Progress Notes (Signed)
Utilization review complete 

## 2011-12-31 NOTE — Progress Notes (Signed)
Subjective:  Still with mild chest pain. Objective: Filed Vitals:   12/31/11 4098 12/31/11 0633 12/31/11 0719 12/31/11 1028  BP: 123/78 123/78 128/85   Pulse:   64   Temp: 97.8 F (36.6 C) 97.8 F (36.6 C) 97.8 F (36.6 C)   TempSrc: Oral Oral Oral   Resp: 15 14 18    Height:    5\' 2"  (1.575 m)  Weight:    86.7 kg (191 lb 2.2 oz)  SpO2: 98% 99% 96%    Weight change:  No intake or output data in the 24 hours ending 12/31/11 1325  General: Alert, awake, oriented x3, in no acute distress.  HEENT: No bruits, no goiter.  Heart: Regular rate and rhythm, without murmurs, rubs, gallops.  Lungs: good air movement CTA B/L. Tender to palpation on left side chest. Abdomen: Soft, nontender, nondistended, positive bowel sounds.  Neuro: Grossly intact, nonfocal.   Lab Results:  Basename 12/31/11 0827 12/31/11 0412 12/31/11 0331  NA -- 141 138  K -- 3.1* 3.2*  CL -- 103 102  CO2 -- -- 25  GLUCOSE -- 138* 140*  BUN -- 9 10  CREATININE 0.71 0.80 0.67  CALCIUM -- -- 9.8  MG -- -- --  PHOS -- -- --    Basename 12/31/11 0331  AST 32  ALT 45*  ALKPHOS 82  BILITOT 0.3  PROT 7.3  ALBUMIN 3.7   No results found for this basename: LIPASE:2,AMYLASE:2 in the last 72 hours  Basename 12/31/11 0827 12/31/11 0412 12/31/11 0331  WBC 10.6* -- 10.7*  NEUTROABS -- -- 6.3  HGB 15.0 17.0* --  HCT 43.3 50.0* --  MCV 86.8 -- 86.2  PLT 324 -- 323    Basename 12/31/11 0824 12/31/11 0332  CKTOTAL 132 155  CKMB 3.3 3.6  CKMBINDEX -- --  TROPONINI <0.30 --   No components found with this basename: POCBNP:3 No results found for this basename: DDIMER:2 in the last 72 hours No results found for this basename: HGBA1C:2 in the last 72 hours No results found for this basename: CHOL:2,HDL:2,LDLCALC:2,TRIG:2,CHOLHDL:2,LDLDIRECT:2 in the last 72 hours No results found for this basename: TSH,T4TOTAL,FREET3,T3FREE,THYROIDAB in the last 72 hours No results found for this basename:  VITAMINB12:2,FOLATE:2,FERRITIN:2,TIBC:2,IRON:2,RETICCTPCT:2 in the last 72 hours  Micro Results: No results found for this or any previous visit (from the past 240 hour(s)).  Studies/Results: Dg Chest Port 1 View  12/31/2011  *RADIOLOGY REPORT*  Clinical Data: Left-sided chest pain and rib pain.  PORTABLE CHEST - 1 VIEW  Comparison: Chest radiograph performed 06/16/2011  Findings: The lungs are well-aerated and clear.  There is no evidence of focal opacification, pleural effusion or pneumothorax.  The cardiomediastinal silhouette is within normal limits.  No acute osseous abnormalities are seen.  IMPRESSION: No acute cardiopulmonary process seen.  Original Report Authenticated By: Tonia Ghent, M.D.    Medications: I have reviewed the patient's current medications.  Assessment and plan: Principal Problem:  *Chest pain: Echo pending. One set of cardiac enzymes negative. ECG SR, incomplete RBBB, no T wave inversions. Home in am if cardiac markers continue to be negative stress test as an outpatient.  2. HTN (hypertension) Controlled continue current treatment.  3.Tobacco abuse Counseling.  4. Arthritis Stable.      LOS: 0 days   Marinda Elk M.D. Pager: [phone] [facility] 12/31/2011, 1:25 PM

## 2011-12-31 NOTE — ED Provider Notes (Signed)
History     CSN: [id number]  Arrival date & time 12/31/11  0303   First MD Initiated Contact with Patient 12/31/11 [phone]      Chief Complaint  Patient presents with  . Chest Pain    (Consider location/radiation/quality/duration/timing/severity/associated sxs/prior treatment) HPI Comments: 51 year old female with a history of chest pain that awoke her from sleep approximately 12:30 AM. She states that it is her left lower chest under her left breast, has a feeling of significant heaviness and pressure in her chest. She did have some burping earlier but currently is not having any but has persistent pressure. She denies nausea or shortness of breath. The symptoms are persistent, nothing makes this better or worse. She denies having any cardiac history but has never been worked up for heart disease. She does admit to having hypertension and does smoke cigarettes. She did not take any aspirin prior to arrival.  Patient is a 51 y.o. female presenting with chest pain. The history is provided by the patient.  Chest Pain     Past Medical History  Diagnosis Date  . Bronchitis   . Hypertension   . Arthritis     Past Surgical History  Procedure Date  . Partial hysterectomy     Family History  Problem Relation Age of Onset  . Hypertension Other   . Diabetes Other   . Asthma Other     History  Substance Use Topics  . Smoking status: Current Everyday Smoker  . Smokeless tobacco: Not on file  . Alcohol Use: No    OB History    Grav Para Term Preterm Abortions TAB SAB Ect Mult Living                  Review of Systems  Cardiovascular: Positive for chest pain.  All other systems reviewed and are negative.    Allergies  Review of patient's allergies indicates no known allergies.  Home Medications   Current Outpatient Rx  Name Route Sig Dispense Refill  . ATENOLOL-CHLORTHALIDONE 50-25 MG PO TABS Oral Take 1 tablet by mouth daily.      Marland Kitchen VITAMIN D3 2000 UNITS PO TABS  Oral Take 2,000 Units by mouth every morning.    Marland Kitchen DICLOFENAC SODIUM 75 MG PO TBEC Oral Take 75 mg by mouth 2 (two) times daily.    . ADULT MULTIVITAMIN W/MINERALS CH Oral Take 1 tablet by mouth daily.      BP 127/77  Pulse 73  Temp(Src) 98.1 F (36.7 C) (Oral)  Resp 16  SpO2 97%  Physical Exam  Nursing note and vitals reviewed. Constitutional: She appears well-developed and well-nourished. No distress.  HENT:  Head: Normocephalic and atraumatic.  Mouth/Throat: Oropharynx is clear and moist. No oropharyngeal exudate.  Eyes: Conjunctivae and EOM are normal. Pupils are equal, round, and reactive to light. Right eye exhibits no discharge. Left eye exhibits no discharge. No scleral icterus.  Neck: Normal range of motion. Neck supple. No JVD present. No thyromegaly present.  Cardiovascular: Normal rate, regular rhythm, normal heart sounds and intact distal pulses.  Exam reveals no gallop and no friction rub.   No murmur heard. Pulmonary/Chest: Effort normal and breath sounds normal. No respiratory distress. She has no wheezes. She has no rales.  Abdominal: Soft. Bowel sounds are normal. She exhibits no distension and no mass. There is no tenderness.  Musculoskeletal: Normal range of motion. She exhibits no edema and no tenderness.  Lymphadenopathy:    She has no cervical adenopathy.  Neurological: She is alert. Coordination normal.  Skin: Skin is warm and dry. No rash noted. No erythema.  Psychiatric: She has a normal mood and affect. Her behavior is normal.    ED Course  Procedures (including critical care time)    Labs Reviewed  CBC - Abnormal; Notable for the following:    WBC 10.7 (*)    RBC 5.22 (*)    Hemoglobin 16.0 (*)    All other components within normal limits  COMPREHENSIVE METABOLIC PANEL - Abnormal; Notable for the following:    Potassium 3.2 (*)    Glucose, Bld 140 (*)    ALT 45 (*)    All other components within normal limits  POCT I-STAT, CHEM 8 - Abnormal;  Notable for the following:    Potassium 3.1 (*)    Glucose, Bld 138 (*)    Hemoglobin 17.0 (*)    HCT 50.0 (*)    All other components within normal limits  DIFFERENTIAL  CK TOTAL AND CKMB  POCT I-STAT TROPONIN I  APTT  PROTIME-INR   No results found.   1. Chest pain       MDM  EKG was nonspecific abnormalities, patient has continuing pressure in her chest and has mild hypertension with 150/92. We'll proceed with laboratory workup, aspirin, chest x-ray.  ED ECG REPORT   Date: 12/31/2011   Rate: 79  Rhythm: normal sinus rhythm  QRS Axis: normal  Intervals: normal  ST/T Wave abnormalities: nonspecific ST changes  Conduction Disutrbances:nonspecific intraventricular conduction delay  Narrative Interpretation:   Old EKG Reviewed: none available  Patient reevaluated, states that her pain went completely away with nitroglycerin sublingual. Vital signs have improved, patient is now pain-free and laboratory evaluation reviewed.  This shows slightly hypokalemic, slight hyperglycemia, otherwise no significant findings.  Care discussed with the [facility] who will admit the patient.  ASA given on arrival  Vida Roller, MD 12/31/11 [phone]

## 2011-12-31 NOTE — ED Notes (Signed)
The pt woke up last pm with a sudden sharp pain inher lt lower chest just under her lt  Breast with lt sided radiation.  She has been burpig also.

## 2011-12-31 NOTE — Consult Note (Signed)
Pt is a 1 ppd smoker who is in action stage and wants to quit. Recommended 21 mg patch x 6 weeks, 14 mg patch x 2 weeks and 7 mg patch x 2 weeks. Discussed and wrote down patch use instructions for the pt. Referred to 1-800 quit now for f/u and support. Discussed oral fixation substitutes, second hand smoke and in home smoking policy. Reviewed and gave pt Written education/contact information.

## 2012-01-01 LAB — CARDIAC PANEL(CRET KIN+CKTOT+MB+TROPI)
CK, MB: 2.7 ng/mL (ref 0.3–4.0)
Relative Index: 2.7 — ABNORMAL HIGH (ref 0.0–2.5)
Total CK: 100 U/L (ref 7–177)
Troponin I: <0.3 ng/mL (ref <0.30)

## 2012-01-01 MED ORDER — PANTOPRAZOLE SODIUM 40 MG PO TBEC: 40.0000 mg | DELAYED_RELEASE_TABLET | Freq: Every day | ORAL | Status: DC

## 2012-01-01 NOTE — Consult Note (Signed)
Admit date: 12/31/2011 Referring Physician  Dr. Robb Matar Primary Physician Dow Adolph, MD, MD Primary Cardiologist  None Reason for Consultation  evaluation of chest pain  HPI: 52 year old female with obesity, tobacco use, prior history of hypertension, bronchitis with no prior history of cardiovascular disease, mother with coronary artery disease diagnosed in her 27s, who presented to the emergency room with left-sided underneath the left breast chest pain described as a sharp/pressure lasting approximately 1-1/2 hours. She has noted, she bends over for instance she has this sharp left-sided under the breast chest pain that is in similar location to what she is experiencing last night. She also felt as though this could be GERD related or gas related after she ate her dinner. Nonetheless, all of her cardiac markers are normal, chest x-ray is unremarkable, EKG shows incomplete right bundle branch block with no ST segment changes, and she is currently chest pain-free. She's not having any dyspnea on exertion other than mild degree that she has likely secondary to smoking and obesity. She is never had a stress test before.    PMH:   Past Medical History  Diagnosis Date  . Bronchitis   . Hypertension   . Arthritis     PSH:   Past Surgical History  Procedure Date  . Partial hysterectomy   . Abdominal hysterectomy    Allergies:  Review of patient's allergies indicates no known allergies. Prior to Admit Meds:   Prescriptions prior to admission  Medication Sig Dispense Refill  . atenolol-chlorthalidone (TENORETIC) 50-25 MG per tablet Take 1 tablet by mouth daily.        . Cholecalciferol (VITAMIN D3) 2000 UNITS TABS Take 2,000 Units by mouth every morning.      . diclofenac (VOLTAREN) 75 MG EC tablet Take 75 mg by mouth 2 (two) times daily.      . Multiple Vitamin (MULITIVITAMIN WITH MINERALS) TABS Take 1 tablet by mouth daily.       Fam HX:    Family History  Problem Relation Age of  Onset  . Hypertension Other   . Diabetes Other   . Asthma Other   . Liver cancer Father   . Diabetes type II Sister   . Diabetes type II Brother    Social HX:    History   Social History  . Marital Status: Legally Separated    Spouse Name: N/A    Number of Children: N/A  . Years of Education: N/A   Occupational History  . Not on file.   Social History Main Topics  . Smoking status: Current Everyday Smoker  . Smokeless tobacco: Not on file  . Alcohol Use: No  . Drug Use: No  . Sexually Active:    Other Topics Concern  . Not on file   Social History Narrative  . No narrative on file     ROS:  All 11 ROS were addressed and are negative except what is stated in the HPI  Physical Exam: Blood pressure 118/76, pulse 75, temperature 98.7 F (37.1 C), temperature source Oral, resp. rate 18, height 5\' 2"  (1.575 m), weight 86.7 kg (191 lb 2.2 oz), SpO2 97.00%.    General: Well developed, well nourished, in no acute distress Head: Eyes PERRLA, No xanthomas.   Normal cephalic and atramatic  Lungs:   Clear bilaterally to auscultation and percussion. Normal respiratory effort. No wheezes, no rales. Heart:   HRRR S1 S2 Pulses are 2+ & equal. No murmurs.  No carotid bruit. No JVD.  No abdominal bruits. No femoral bruits. Abdomen: Bowel sounds are positive, abdomen soft and non-tender without masses. No hepatosplenomegaly. Msk:  Back normal, normal gait. Normal strength and tone for age. Extremities:   No clubbing, cyanosis or edema.  DP +1 Neuro: Alert and oriented X 3, non-focal, MAE x 4 GU: Deferred Rectal: Deferred Psych:  Good affect, responds appropriately    Labs:   Lab Results  Component Value Date   WBC 10.6* 12/31/2011   HGB 15.0 12/31/2011   HCT 43.3 12/31/2011   MCV 86.8 12/31/2011   PLT 324 12/31/2011    Lab 12/31/11 0827 12/31/11 0412 12/31/11 0331  NA -- 141 --  K -- 3.1* --  CL -- 103 --  CO2 -- -- 25  BUN -- 9 --  CREATININE 0.71 -- --  CALCIUM  -- -- 9.8  PROT -- -- 7.3  BILITOT -- -- 0.3  ALKPHOS -- -- 82  ALT -- -- 45*  AST -- -- 32  GLUCOSE -- 138* --   No results found for this basename: PTT   Lab Results  Component Value Date   INR 0.96 12/31/2011   Lab Results  Component Value Date   CKTOTAL 100 12/31/2011   CKMB 2.7 12/31/2011   TROPONINI <0.30 12/31/2011        Radiology:  Dg Chest Port 1 View  12/31/2011  *RADIOLOGY REPORT*  Clinical Data: Left-sided chest pain and rib pain.  PORTABLE CHEST - 1 VIEW  Comparison: Chest radiograph performed 06/16/2011  Findings: The lungs are well-aerated and clear.  There is no evidence of focal opacification, pleural effusion or pneumothorax.  The cardiomediastinal silhouette is within normal limits.  No acute osseous abnormalities are seen.  IMPRESSION: No acute cardiopulmonary process seen.  Original Report Authenticated By: Tonia Ghent, M.D.   Personally viewed.  EKG:  IRBBB, NSR, No st changes Personally viewed.   ASSESSMENT/PLAN:   51 year old with obesity, tobacco use, mother with coronary artery disease in her 66s, hypertension with atypical chest pain.  Chest pain-atypical and likely musculoskeletal in origin or perhaps GERD. Nonetheless given her cardiac risk factors I do believe that further restrict occasion with a stress test is very reasonable. I will have our office call her to get this set up. This will likely be done at [facility]. EKG unremarkable, enzymes unremarkable. Overall reassuring. I feel comfortable allowing her to go home. If symptoms worsen or become more worrisome she knows to seek medical attention. Continue with aggressive risk factor modification including tobacco cessation, weight loss. Blood pressure control is of utmost importance.  Donato Schultz, MD  01/01/2012  8:51 AM

## 2012-01-01 NOTE — Discharge Summary (Signed)
Admit date: 12/31/2011 Discharge date: 01/01/2012  Primary Care Physician:  Dow Adolph, MD, MD   Discharge Diagnoses:   Active Hospital Problems  Diagnoses Date Noted   . Tobacco abuse 12/31/2011   . Arthritis 12/31/2011     Resolved Hospital Problems  Diagnoses Date Noted Date Resolved  . Chest pain 12/31/2011 01/01/2012  . HTN (hypertension) 12/31/2011 01/01/2012     DISCHARGE MEDICATION: Medication List  As of 01/01/2012  8:36 AM   TAKE these medications         atenolol-chlorthalidone 50-25 MG per tablet   Commonly known as: TENORETIC   Take 1 tablet by mouth daily.      diclofenac 75 MG EC tablet   Commonly known as: VOLTAREN   Take 75 mg by mouth 2 (two) times daily.      mulitivitamin with minerals Tabs   Take 1 tablet by mouth daily.      pantoprazole 40 MG tablet   Commonly known as: PROTONIX   Take 1 tablet (40 mg total) by mouth daily at 12 noon.      Vitamin D3 2000 UNITS Tabs   Take 2,000 Units by mouth every morning.              Consults:     SIGNIFICANT DIAGNOSTIC STUDIES:  Dg Chest Port 1 View  12/31/2011  *RADIOLOGY REPORT*  Clinical Data: Left-sided chest pain and rib pain.  PORTABLE CHEST - 1 VIEW  Comparison: Chest radiograph performed 06/16/2011  Findings: The lungs are well-aerated and clear.  There is no evidence of focal opacification, pleural effusion or pneumothorax.  The cardiomediastinal silhouette is within normal limits.  No acute osseous abnormalities are seen.  IMPRESSION: No acute cardiopulmonary process seen.  Original Report Authenticated By: Tonia Ghent, M.D.     No results found for this or any previous visit (from the past 240 hour(s)).  BRIEF ADMITTING H & P:  Here for history of hypertension ongoing tobacco abuse and arthritis has experienced a sudden onset of chest pain last midnight while sleeping which woke her from sleep. Pain is retrosternal radiating a little to the left. Patient's chest pain improved after  she received nitroglycerin sublingual. Patient denies any associated shortness of breath or exertional symptoms. She states she's been having recent of chest pain off and on for a few days now. Denies any cough phlegm or fever chills. The origins and 7... if it is/are negative EKG shows nonspecific changes with RBBB. Patient at this time is chest pain-free and will be admitted for further workup.  Active Hospital Problems  Diagnoses Date Noted   . Tobacco abuse: counseling 12/31/2011   . Arthritis: diclofenac 12/31/2011     Resolved Hospital Problems  Diagnoses Date Noted Date Resolved  . Chest pain: Resolved, tender to palpation. Cardiac markers negative times 3. ECK no sign of ischemia. Pain resolved. She is taking diclofenac. Started protonix empirically. No hisotry of GERD. [facility] test [facility] an outpatient with [facility]. 12/31/2011 01/01/2012  . HTN (hypertension): Controlled.  01/01/2012     Disposition and Follow-up:  -will be called with stress test as an outpatient. Discharge Orders    Future Appointments: Provider: Department: Dept Phone: Center:   01/11/2012 5:20 PM [facility] [phone]     Future Orders Please Complete By Expires   Diet - low sodium heart healthy      Increase activity slowly        Follow-up Information    Follow up with [facility],  MD in 2 weeks.          DISCHARGE EXAM:  General: Alert, awake, oriented x3, in no acute distress.  HEENT: No bruits, no goiter.  Heart: Regular rate and rhythm, without murmurs, rubs, gallops.  Lungs: good air movement CTA B/L. No tenderness.  Abdomen: Soft, nontender, nondistended, positive bowel sounds.  Neuro: Grossly intact, nonfocal.   Blood pressure 118/76, pulse 75, temperature 98.7 F (37.1 C), temperature source Oral, resp. rate 18, height 5\' 2"  (1.575 m), weight 86.7 kg (191 lb 2.2 oz), SpO2 97.00%.   Basename 12/31/11 0827 12/31/11 0412 12/31/11 0331  NA -- 141 138  K -- 3.1* 3.2*  CL  -- 103 102  CO2 -- -- 25  GLUCOSE -- 138* 140*  BUN -- 9 10  CREATININE 0.71 0.80 --  CALCIUM -- -- 9.8  MG -- -- --  PHOS -- -- --    Basename 12/31/11 0331  AST 32  ALT 45*  ALKPHOS 82  BILITOT 0.3  PROT 7.3  ALBUMIN 3.7   No results found for this basename: LIPASE:2,AMYLASE:2 in the last 72 hours  Basename 12/31/11 0827 12/31/11 0412 12/31/11 0331  WBC 10.6* -- 10.7*  NEUTROABS -- -- 6.3  HGB 15.0 17.0* --  HCT 43.3 50.0* --  MCV 86.8 -- 86.2  PLT 324 -- 323    Signed: Marinda Elk M.D. 01/01/2012, 8:36 AM

## 2012-01-01 NOTE — Progress Notes (Signed)
01/01/2012 0930 Nursing Note:  Discharge AVS form, called in RX and medications due today given and explained to patient and family. Follow up appointments discussed.  D/c iv . D/c tele. D/c home per orders.

## 2012-01-03 NOTE — Progress Notes (Signed)
   CARE MANAGEMENT NOTE 01/03/2012  Patient:  Sonya Walton, Sonya Walton   Account Number:  [id number]  Date Initiated:  01/03/2012  Documentation initiated by:  Letha Cape  Subjective/Objective Assessment:   dx chest pain     Action/Plan:   continue progression of care   Anticipated DC Date:  01/01/2012   Anticipated DC Plan:  HOME/SELF CARE      DC Planning Services  CM consult      Choice offered to / List presented to:             Status of service:  Completed, signed off Medicare Important Message given?   (If response is "NO", the following Medicare IM given date fields will be blank) Date Medicare IM given:   Date Additional Medicare IM given:    Discharge Disposition:  HOME/SELF CARE  Per UR Regulation:    Comments:

## 2012-01-06 ENCOUNTER — Other Ambulatory Visit (HOSPITAL_COMMUNITY): Payer: Self-pay | Admitting: Cardiology

## 2012-01-11 ENCOUNTER — Encounter (HOSPITAL_COMMUNITY)
Admission: RE | Admit: 2012-01-11 | Discharge: 2012-01-11 | Disposition: A | Discharge status: Home / Self Care | Payer: Self-pay | Source: Ambulatory Visit | Attending: Cardiology | Admitting: Cardiology

## 2012-01-11 ENCOUNTER — Ambulatory Visit (HOSPITAL_COMMUNITY)
Admission: RE | Admit: 2012-01-11 | Discharge: 2012-01-11 | Disposition: A | Discharge status: Home / Self Care | Payer: Self-pay | Source: Ambulatory Visit | Attending: Cardiology | Admitting: Cardiology

## 2012-01-11 ENCOUNTER — Ambulatory Visit (HOSPITAL_COMMUNITY)
Admission: RE | Admit: 2012-01-11 | Discharge: 2012-01-11 | Disposition: A | Discharge status: Home / Self Care | Payer: Self-pay | Source: Ambulatory Visit | Attending: Physician Assistant | Admitting: Physician Assistant

## 2012-01-11 DIAGNOSIS — Z1231 Encounter for screening mammogram for malignant neoplasm of breast: Secondary | ICD-10-CM | POA: Insufficient documentation

## 2012-01-11 DIAGNOSIS — R079 Chest pain, unspecified: Secondary | ICD-10-CM | POA: Insufficient documentation

## 2012-01-11 MED ORDER — REGADENOSON 0.4 MG/5ML IV SOLN
0.4000 mg | Freq: Once | INTRAVENOUS | Status: AC
  Administered 2012-01-11: 0.4 mg via INTRAVENOUS

## 2012-01-11 MED ORDER — REGADENOSON 0.4 MG/5ML IV SOLN
INTRAVENOUS | Status: AC
  Filled 2012-01-11: qty 5

## 2012-01-11 MED ORDER — TECHNETIUM TC 99M TETROFOSMIN IV KIT
10.0000 | PACK | Freq: Once | INTRAVENOUS | Status: AC | PRN
  Administered 2012-01-11: 10 via INTRAVENOUS

## 2012-01-11 MED ORDER — TECHNETIUM TC 99M TETROFOSMIN IV KIT
30.0000 | PACK | Freq: Once | INTRAVENOUS | Status: AC | PRN
  Administered 2012-01-11: 30 via INTRAVENOUS

## 2012-01-13 ENCOUNTER — Other Ambulatory Visit: Payer: Self-pay | Admitting: Physician Assistant

## 2012-01-13 DIAGNOSIS — R928 Other abnormal and inconclusive findings on diagnostic imaging of breast: Secondary | ICD-10-CM

## 2012-01-17 ENCOUNTER — Encounter (HOSPITAL_COMMUNITY): Payer: Self-pay | Admitting: Certified Registered"

## 2012-01-17 ENCOUNTER — Encounter (HOSPITAL_COMMUNITY): Payer: Self-pay

## 2012-01-17 ENCOUNTER — Emergency Department (HOSPITAL_COMMUNITY): Payer: Self-pay | Admitting: Certified Registered"

## 2012-01-17 ENCOUNTER — Ambulatory Visit (HOSPITAL_COMMUNITY)
Admission: EM | Admit: 2012-01-17 | Discharge: 2012-01-18 | Disposition: A | Discharge status: Home / Self Care | Payer: Self-pay | Attending: Surgery | Admitting: Surgery

## 2012-01-17 ENCOUNTER — Encounter (HOSPITAL_COMMUNITY): Payer: Self-pay | Admitting: *Deleted

## 2012-01-17 ENCOUNTER — Emergency Department (HOSPITAL_COMMUNITY): Payer: Self-pay

## 2012-01-17 ENCOUNTER — Encounter (HOSPITAL_COMMUNITY)
Admission: EM | Disposition: A | Discharge status: Home / Self Care | Payer: Self-pay | Source: Home / Self Care | Attending: Emergency Medicine

## 2012-01-17 DIAGNOSIS — K819 Cholecystitis, unspecified: Secondary | ICD-10-CM

## 2012-01-17 DIAGNOSIS — K8000 Calculus of gallbladder with acute cholecystitis without obstruction: Secondary | ICD-10-CM

## 2012-01-17 DIAGNOSIS — I10 Essential (primary) hypertension: Secondary | ICD-10-CM | POA: Insufficient documentation

## 2012-01-17 DIAGNOSIS — M129 Arthropathy, unspecified: Secondary | ICD-10-CM | POA: Insufficient documentation

## 2012-01-17 DIAGNOSIS — R1011 Right upper quadrant pain: Secondary | ICD-10-CM

## 2012-01-17 HISTORY — PX: CHOLECYSTECTOMY: SHX55

## 2012-01-17 LAB — LIPASE, BLOOD: Lipase: 31 U/L (ref 11–59)

## 2012-01-17 LAB — COMPREHENSIVE METABOLIC PANEL
ALT: 71 U/L — ABNORMAL HIGH (ref 0–35)
AST: 34 U/L (ref 0–37)
Albumin: 4.1 g/dL (ref 3.5–5.2)
Alkaline Phosphatase: 92 U/L (ref 39–117)
BUN: 13 mg/dL (ref 6–23)
CO2: 29 mEq/L (ref 19–32)
Calcium: 9.7 mg/dL (ref 8.4–10.5)
Chloride: 100 mEq/L (ref 96–112)
Creatinine, Ser: 0.62 mg/dL (ref 0.50–1.10)
GFR calc Af Amer: >90 mL/min (ref >90)
GFR calc non Af Amer: >90 mL/min (ref >90)
Glucose, Bld: 123 mg/dL — ABNORMAL HIGH (ref 70–99)
Potassium: 3.5 mEq/L (ref 3.5–5.1)
Sodium: 139 mEq/L (ref 135–145)
Total Bilirubin: 0.4 mg/dL (ref 0.3–1.2)
Total Protein: 7.8 g/dL (ref 6.0–8.3)

## 2012-01-17 LAB — DIFFERENTIAL
Basophils Absolute: 0 10*3/uL (ref 0.0–0.1)
Basophils Relative: 0 % (ref 0–1)
Eosinophils Absolute: 0 10*3/uL (ref 0.0–0.7)
Eosinophils Relative: 0 % (ref 0–5)
Lymphocytes Relative: 15 % (ref 12–46)
Lymphs Abs: 2.5 10*3/uL (ref 0.7–4.0)
Monocytes Absolute: 1 10*3/uL (ref 0.1–1.0)
Monocytes Relative: 6 % (ref 3–12)
Neutro Abs: 13.3 10*3/uL — ABNORMAL HIGH (ref 1.7–7.7)
Neutrophils Relative %: 79 % — ABNORMAL HIGH (ref 43–77)

## 2012-01-17 LAB — CBC
HCT: 45.4 % (ref 36.0–46.0)
Hemoglobin: 15.9 g/dL — ABNORMAL HIGH (ref 12.0–15.0)
MCH: 30.1 pg (ref 26.0–34.0)
MCHC: 35 g/dL (ref 30.0–36.0)
MCV: 85.8 fL (ref 78.0–100.0)
Platelets: 364 10*3/uL (ref 150–400)
RBC: 5.29 MIL/uL — ABNORMAL HIGH (ref 3.87–5.11)
RDW: 14.3 % (ref 11.5–15.5)
WBC: 17 10*3/uL — ABNORMAL HIGH (ref 4.0–10.5)

## 2012-01-17 SURGERY — LAPAROSCOPIC CHOLECYSTECTOMY
Anesthesia: General | Site: Abdomen | Wound class: Clean Contaminated

## 2012-01-17 MED ORDER — ONDANSETRON HCL 4 MG/2ML IJ SOLN
4.0000 mg | Freq: Four times a day (QID) | INTRAMUSCULAR | Status: DC | PRN
  Administered 2012-01-18: 4 mg via INTRAVENOUS
  Filled 2012-01-17: qty 2

## 2012-01-17 MED ORDER — HYDROMORPHONE HCL PF 1 MG/ML IJ SOLN
1.0000 mg | Freq: Once | INTRAMUSCULAR | Status: AC
  Administered 2012-01-17: 1 mg via INTRAVENOUS
  Filled 2012-01-17: qty 1

## 2012-01-17 MED ORDER — FENTANYL CITRATE 0.05 MG/ML IJ SOLN
50.0000 ug | Freq: Once | INTRAMUSCULAR | Status: AC
  Administered 2012-01-17: 50 ug via INTRAVENOUS
  Filled 2012-01-17: qty 2

## 2012-01-17 MED ORDER — LACTATED RINGERS IV SOLN
INTRAVENOUS | Status: DC
  Administered 2012-01-17: 14:00:00 via INTRAVENOUS

## 2012-01-17 MED ORDER — MORPHINE SULFATE 2 MG/ML IJ SOLN: 0.0500 mg/kg | INTRAMUSCULAR | Status: DC | PRN

## 2012-01-17 MED ORDER — HYDROMORPHONE HCL PF 1 MG/ML IJ SOLN
2.0000 mg | INTRAMUSCULAR | Status: DC | PRN
  Administered 2012-01-17: 0.5 mg via INTRAVENOUS
  Administered 2012-01-18: 2 mg via INTRAVENOUS
  Administered 2012-01-18: 1 mg via INTRAVENOUS
  Filled 2012-01-17: qty 2
  Filled 2012-01-17: qty 1

## 2012-01-17 MED ORDER — ATENOLOL-CHLORTHALIDONE 50-25 MG PO TABS: 1.0000 | ORAL_TABLET | Freq: Every day | ORAL | Status: DC

## 2012-01-17 MED ORDER — IBUPROFEN 600 MG PO TABS
600.0000 mg | ORAL_TABLET | Freq: Four times a day (QID) | ORAL | Status: DC | PRN
  Filled 2012-01-17: qty 1

## 2012-01-17 MED ORDER — ENOXAPARIN SODIUM 40 MG/0.4ML ~~LOC~~ SOLN
40.0000 mg | SUBCUTANEOUS | Status: DC
  Filled 2012-01-17: qty 0.4

## 2012-01-17 MED ORDER — CHLORTHALIDONE 25 MG PO TABS
25.0000 mg | ORAL_TABLET | Freq: Every day | ORAL | Status: DC
  Administered 2012-01-18: 25 mg via ORAL
  Filled 2012-01-17: qty 1

## 2012-01-17 MED ORDER — SODIUM CHLORIDE 0.9 % IV SOLN
1.0000 g | INTRAVENOUS | Status: DC
  Filled 2012-01-17: qty 1

## 2012-01-17 MED ORDER — SODIUM CHLORIDE 0.9 % IV BOLUS (SEPSIS)
1000.0000 mL | Freq: Once | INTRAVENOUS | Status: AC
  Administered 2012-01-17: 1000 mL via INTRAVENOUS

## 2012-01-17 MED ORDER — PROPOFOL 10 MG/ML IV EMUL
INTRAVENOUS | Status: DC | PRN
  Administered 2012-01-17: 130 mg via INTRAVENOUS

## 2012-01-17 MED ORDER — ERTAPENEM SODIUM 1 G IJ SOLR
1.0000 g | Freq: Once | INTRAMUSCULAR | Status: DC
  Filled 2012-01-17: qty 1

## 2012-01-17 MED ORDER — SUCCINYLCHOLINE CHLORIDE 20 MG/ML IJ SOLN
INTRAMUSCULAR | Status: DC | PRN
  Administered 2012-01-17: 100 mg via INTRAVENOUS

## 2012-01-17 MED ORDER — ATENOLOL 50 MG PO TABS
50.0000 mg | ORAL_TABLET | Freq: Every day | ORAL | Status: DC
  Administered 2012-01-18: 50 mg via ORAL
  Filled 2012-01-17: qty 1

## 2012-01-17 MED ORDER — SODIUM CHLORIDE 0.9 % IV SOLN: 1.0000 g | INTRAVENOUS | Status: DC

## 2012-01-17 MED ORDER — OXYCODONE-ACETAMINOPHEN 5-325 MG PO TABS
1.0000 | ORAL_TABLET | ORAL | Status: DC | PRN
  Filled 2012-01-17: qty 2

## 2012-01-17 MED ORDER — HYDROMORPHONE HCL PF 1 MG/ML IJ SOLN
0.2500 mg | INTRAMUSCULAR | Status: DC | PRN
  Administered 2012-01-17 (×2): 0.5 mg via INTRAVENOUS

## 2012-01-17 MED ORDER — BUPIVACAINE-EPINEPHRINE 0.25% -1:200000 IJ SOLN
INTRAMUSCULAR | Status: DC | PRN
  Administered 2012-01-17: 20 mL

## 2012-01-17 MED ORDER — POTASSIUM CHLORIDE IN NACL 20-0.9 MEQ/L-% IV SOLN
INTRAVENOUS | Status: DC
  Administered 2012-01-17 – 2012-01-18 (×2): via INTRAVENOUS
  Filled 2012-01-17 (×6): qty 1000

## 2012-01-17 MED ORDER — NEOSTIGMINE METHYLSULFATE 1 MG/ML IJ SOLN
INTRAMUSCULAR | Status: DC | PRN
  Administered 2012-01-17: 3 mg via INTRAVENOUS

## 2012-01-17 MED ORDER — ONDANSETRON HCL 4 MG/2ML IJ SOLN
4.0000 mg | Freq: Once | INTRAMUSCULAR | Status: AC
  Administered 2012-01-17: 4 mg via INTRAVENOUS
  Filled 2012-01-17: qty 2

## 2012-01-17 MED ORDER — SODIUM CHLORIDE 0.9 % IR SOLN
Status: DC | PRN
  Administered 2012-01-17: 1000 mL

## 2012-01-17 MED ORDER — SODIUM CHLORIDE 0.9 % IV SOLN
1.0000 g | INTRAVENOUS | Status: DC | PRN
  Administered 2012-01-17: 1 g via INTRAVENOUS

## 2012-01-17 MED ORDER — FENTANYL CITRATE 0.05 MG/ML IJ SOLN
INTRAMUSCULAR | Status: DC | PRN
  Administered 2012-01-17: 150 ug via INTRAVENOUS
  Administered 2012-01-17: 100 ug via INTRAVENOUS

## 2012-01-17 MED ORDER — ONDANSETRON HCL 4 MG PO TABS: 4.0000 mg | ORAL_TABLET | Freq: Four times a day (QID) | ORAL | Status: DC | PRN

## 2012-01-17 MED ORDER — GLYCOPYRROLATE 0.2 MG/ML IJ SOLN
INTRAMUSCULAR | Status: DC | PRN
  Administered 2012-01-17: .5 mg via INTRAVENOUS

## 2012-01-17 MED ORDER — MIDAZOLAM HCL 5 MG/5ML IJ SOLN
INTRAMUSCULAR | Status: DC | PRN
  Administered 2012-01-17: 2 mg via INTRAVENOUS

## 2012-01-17 MED ORDER — ROCURONIUM BROMIDE 100 MG/10ML IV SOLN
INTRAVENOUS | Status: DC | PRN
  Administered 2012-01-17: 25 mg via INTRAVENOUS

## 2012-01-17 MED ORDER — ONDANSETRON HCL 4 MG/2ML IJ SOLN: 4.0000 mg | Freq: Once | INTRAMUSCULAR | Status: DC | PRN

## 2012-01-17 MED ORDER — 0.9 % SODIUM CHLORIDE (POUR BTL) OPTIME
TOPICAL | Status: DC | PRN
  Administered 2012-01-17: 1000 mL

## 2012-01-17 MED ORDER — HYDROMORPHONE HCL PF 1 MG/ML IJ SOLN
INTRAMUSCULAR | Status: AC
  Administered 2012-01-17: 0.5 mg via INTRAVENOUS
  Filled 2012-01-17: qty 1

## 2012-01-17 MED ORDER — ONDANSETRON HCL 4 MG/2ML IJ SOLN
INTRAMUSCULAR | Status: DC | PRN
  Administered 2012-01-17: 4 mg via INTRAVENOUS

## 2012-01-17 SURGICAL SUPPLY — 42 items
APL SKNCLS STERI-STRIP NONHPOA (GAUZE/BANDAGES/DRESSINGS) ×2
APPLIER CLIP 5 13 M/L LIGAMAX5 (MISCELLANEOUS) ×3
APR CLP MED LRG 5 ANG JAW (MISCELLANEOUS) ×2
BAG SPEC RTRVL LRG 6X4 10 (ENDOMECHANICALS) ×2
BANDAGE ADHESIVE 1X3 (GAUZE/BANDAGES/DRESSINGS) ×12 IMPLANT
BENZOIN TINCTURE PRP APPL 2/3 (GAUZE/BANDAGES/DRESSINGS) ×3 IMPLANT
CANISTER SUCTION 2500CC (MISCELLANEOUS) ×3 IMPLANT
CHLORAPREP W/TINT 26ML (MISCELLANEOUS) ×3 IMPLANT
CLIP APPLIE 5 13 M/L LIGAMAX5 (MISCELLANEOUS) ×2 IMPLANT
CLOTH BEACON ORANGE TIMEOUT ST (SAFETY) ×3 IMPLANT
COVER MAYO STAND STRL (DRAPES) IMPLANT
COVER SURGICAL LIGHT HANDLE (MISCELLANEOUS) ×3 IMPLANT
DECANTER SPIKE VIAL GLASS SM (MISCELLANEOUS) ×3 IMPLANT
DRAPE C-ARM 42X72 X-RAY (DRAPES) IMPLANT
ELECT REM PT RETURN 9FT ADLT (ELECTROSURGICAL) ×3
ELECTRODE REM PT RTRN 9FT ADLT (ELECTROSURGICAL) ×2 IMPLANT
GLOVE BIO SURGEON STRL SZ7.5 (GLOVE) ×2 IMPLANT
GLOVE BIOGEL PI IND STRL 7.5 (GLOVE) ×2 IMPLANT
GLOVE BIOGEL PI IND STRL 8 (GLOVE) ×1 IMPLANT
GLOVE BIOGEL PI INDICATOR 7.5 (GLOVE) ×2
GLOVE BIOGEL PI INDICATOR 8 (GLOVE) ×1
GLOVE ECLIPSE 7.0 STRL STRAW (GLOVE) ×2 IMPLANT
GLOVE ECLIPSE 7.5 STRL STRAW (GLOVE) ×2 IMPLANT
GLOVE SURG SIGNA 7.5 PF LTX (GLOVE) ×3 IMPLANT
GOWN PREVENTION PLUS XLARGE (GOWN DISPOSABLE) ×3 IMPLANT
GOWN STRL NON-REIN LRG LVL3 (GOWN DISPOSABLE) ×7 IMPLANT
KIT BASIN OR (CUSTOM PROCEDURE TRAY) ×3 IMPLANT
KIT ROOM TURNOVER OR (KITS) ×3 IMPLANT
NS IRRIG 1000ML POUR BTL (IV SOLUTION) ×3 IMPLANT
PAD ARMBOARD 7.5X6 YLW CONV (MISCELLANEOUS) ×3 IMPLANT
POUCH SPECIMEN RETRIEVAL 10MM (ENDOMECHANICALS) ×2 IMPLANT
SCISSORS LAP 5X35 DISP (ENDOMECHANICALS) ×2 IMPLANT
SET CHOLANGIOGRAPH 5 50 .035 (SET/KITS/TRAYS/PACK) IMPLANT
SET IRRIG TUBING LAPAROSCOPIC (IRRIGATION / IRRIGATOR) ×3 IMPLANT
SLEEVE ENDOPATH XCEL 5M (ENDOMECHANICALS) ×6 IMPLANT
SPECIMEN JAR SMALL (MISCELLANEOUS) ×3 IMPLANT
SUT MON AB 4-0 PC3 18 (SUTURE) ×3 IMPLANT
TOWEL OR 17X24 6PK STRL BLUE (TOWEL DISPOSABLE) ×3 IMPLANT
TOWEL OR 17X26 10 PK STRL BLUE (TOWEL DISPOSABLE) ×3 IMPLANT
TRAY LAPAROSCOPIC (CUSTOM PROCEDURE TRAY) ×3 IMPLANT
TROCAR XCEL BLUNT TIP 100MML (ENDOMECHANICALS) ×3 IMPLANT
TROCAR XCEL NON-BLD 5MMX100MML (ENDOMECHANICALS) ×3 IMPLANT

## 2012-01-17 NOTE — ED Provider Notes (Signed)
Medical screening examination/treatment/procedure(s) were conducted as a shared visit with non-physician practitioner(s) and myself.  I personally evaluated the patient during the encounter  Flint Melter, MD 01/17/12 1723

## 2012-01-17 NOTE — H&P (Signed)
Sonya Walton is an 51 y.o. female.   Chief Complaint: right upper quadrant abdominal pain nausea and vomiting HPI: this is a 51 year old female who presents with a one-day history of right upper quadrant abdominal pain, nausea, and vomiting. The pain is described as sharp and severe. It radiates through to the back. She is otherwise without complaints. She has had mild intermittent attacks in the past. She currently has no other complaints.  Past Medical History  Diagnosis Date  . Bronchitis   . Hypertension   . Arthritis     Past Surgical History  Procedure Date  . Partial hysterectomy   . Abdominal hysterectomy     Family History  Problem Relation Age of Onset  . Hypertension Other   . Diabetes Other   . Asthma Other   . Liver cancer Father   . Diabetes type II Sister   . Diabetes type II Brother    Social History:  reports that she has been smoking.  She does not have any smokeless tobacco history on file. She reports that she does not drink alcohol or use illicit drugs.  Allergies: No Known Allergies  Medications Prior to Admission  Medication Dose Route Frequency Provider Last Rate Last Dose  . fentaNYL (SUBLIMAZE) injection 50 mcg  50 mcg Intravenous Once Rise Patience, PA   50 mcg at 01/17/12 1610  . fentaNYL (SUBLIMAZE) injection 50 mcg  50 mcg Intravenous Once Rise Patience, PA   50 mcg at 01/17/12 1057  . HYDROmorphone (DILAUDID) injection 1 mg  1 mg Intravenous Once Rise Patience, PA   1 mg at 01/17/12 1304  . ondansetron (ZOFRAN) injection 4 mg  4 mg Intravenous Once Rise Patience, PA   4 mg at 01/17/12 0850  . sodium chloride 0.9 % bolus 1,000 mL  1,000 mL Intravenous Once Rise Patience, PA   1,000 mL at 01/17/12 0847  . DISCONTD: regadenoson (LEXISCAN) 0.4 MG/5ML injection SOLN            Medications Prior to Admission  Medication Sig Dispense Refill  . atenolol-chlorthalidone (TENORETIC) 50-25 MG per tablet Take 1 tablet by mouth daily.        . Cholecalciferol  (VITAMIN D3) 2000 UNITS TABS Take 2,000 Units by mouth every morning.      . diclofenac (VOLTAREN) 75 MG EC tablet Take 75 mg by mouth 2 (two) times daily.      . Multiple Vitamin (MULITIVITAMIN WITH MINERALS) TABS Take 1 tablet by mouth daily.        Results for orders placed during the hospital encounter of 01/17/12 (from the past 48 hour(s))  CBC     Status: Abnormal   Collection Time   01/17/12  8:25 AM      Component Value Range Comment   WBC 17.0 (*) 4.0 - 10.5 (K/uL)    RBC 5.29 (*) 3.87 - 5.11 (MIL/uL)    Hemoglobin 15.9 (*) 12.0 - 15.0 (g/dL)    HCT 96.0  45.4 - 09.8 (%)    MCV 85.8  78.0 - 100.0 (fL)    MCH 30.1  26.0 - 34.0 (pg)    MCHC 35.0  30.0 - 36.0 (g/dL)    RDW 11.9  14.7 - 82.9 (%)    Platelets 364  150 - 400 (K/uL)   DIFFERENTIAL     Status: Abnormal   Collection Time   01/17/12  8:25 AM      Component Value Range Comment  Neutrophils Relative 79 (*) 43 - 77 (%)    Neutro Abs 13.3 (*) 1.7 - 7.7 (K/uL)    Lymphocytes Relative 15  12 - 46 (%)    Lymphs Abs 2.5  0.7 - 4.0 (K/uL)    Monocytes Relative 6  3 - 12 (%)    Monocytes Absolute 1.0  0.1 - 1.0 (K/uL)    Eosinophils Relative 0  0 - 5 (%)    Eosinophils Absolute 0.0  0.0 - 0.7 (K/uL)    Basophils Relative 0  0 - 1 (%)    Basophils Absolute 0.0  0.0 - 0.1 (K/uL)   COMPREHENSIVE METABOLIC PANEL     Status: Abnormal   Collection Time   01/17/12  8:25 AM      Component Value Range Comment   Sodium 139  135 - 145 (mEq/L)    Potassium 3.5  3.5 - 5.1 (mEq/L)    Chloride 100  96 - 112 (mEq/L)    CO2 29  19 - 32 (mEq/L)    Glucose, Bld 123 (*) 70 - 99 (mg/dL)    BUN 13  6 - 23 (mg/dL)    Creatinine, Ser 1.61  0.50 - 1.10 (mg/dL)    Calcium 9.7  8.4 - 10.5 (mg/dL)    Total Protein 7.8  6.0 - 8.3 (g/dL)    Albumin 4.1  3.5 - 5.2 (g/dL)    AST 34  0 - 37 (U/L)    ALT 71 (*) 0 - 35 (U/L)    Alkaline Phosphatase 92  39 - 117 (U/L)    Total Bilirubin 0.4  0.3 - 1.2 (mg/dL)    GFR calc non Af Amer >90  >90  (mL/min)    GFR calc Af Amer >90  >90 (mL/min)   LIPASE, BLOOD     Status: Normal   Collection Time   01/17/12  8:25 AM      Component Value Range Comment   Lipase 31  11 - 59 (U/L)    US Abdomen Complete  01/17/2012  *RADIOLOGY REPORT*  Clinical Data:  Upper abdominal pain, nausea/vomiting  COMPLETE ABDOMINAL ULTRASOUND  Comparison:  [facility] [place] dated 05/23/2007  Findings:  Gallbladder:  Cholelithiasis, including a 1.3 cm gallstone lodged in the gallbladder neck.  Associated irregular gallbladder wall thickening/pericholecystic fluid.  Negative sonographic Murphy's sign.  Common bile duct:  Measures 5 mm.  Liver:  Hyperechoic hepatic parenchyma, suggesting hepatic steatosis.  IVC:  Appears normal.  Pancreas:  Incompletely visualized but grossly unremarkable.  Spleen:  Measures 6.8 cm.  Right Kidney:  Measures 12.0 cm.  No mass or hydronephrosis.  Left Kidney:  Measures 11.7 cm.  No mass or hydronephrosis.  Abdominal aorta:  No aneurysm identified.  IMPRESSION: Cholelithiasis with irregular gallbladder wall thickening/pericholecystic fluid.  Despite the absence of a sonographic Murphy's sign, this appearance is suspicious for acute cholecystitis.  Original Report Authenticated By: Charline Bills, M.D.    Review of Systems  Constitutional: Negative.   HENT: Negative.   Eyes: Negative.   Respiratory: Negative.   Cardiovascular: Negative.   Gastrointestinal: Positive for nausea, vomiting and abdominal pain.  Genitourinary: Negative.   Musculoskeletal: Negative.   Skin: Negative.   Neurological: Negative.   Endo/Heme/Allergies: Negative.   Psychiatric/Behavioral: Negative.     Blood pressure 141/87, pulse 88, temperature 98.3 F (36.8 C), temperature source Oral, resp. rate 20, height 5\' 2"  (1.575 m), weight 180 lb (81.647 kg), SpO2 96.00%. Physical Exam  Constitutional: She is oriented to  person, place, and time. She appears well-developed and well-nourished. No distress.  HENT:    Head: Normocephalic and atraumatic.  Right Ear: External ear normal.  Left Ear: External ear normal.  Nose: Nose normal.  Mouth/Throat: Oropharynx is clear and moist. No oropharyngeal exudate.  Eyes: Conjunctivae and EOM are normal. Pupils are equal, round, and reactive to light. No scleral icterus.  Neck: Normal range of motion. Neck supple. No tracheal deviation present. No thyromegaly present.  Cardiovascular: Normal rate, regular rhythm, normal heart sounds and intact distal pulses.   No murmur heard. Respiratory: Effort normal and breath sounds normal. No respiratory distress. She has no wheezes. She has no rales.  GI: Soft. Bowel sounds are normal. There is tenderness. There is guarding.       Right upper quadrant tenderness with guarding  Musculoskeletal: Normal range of motion. She exhibits no edema and no tenderness.  Lymphadenopathy:    She has no cervical adenopathy.  Neurological: She is alert and oriented to person, place, and time.  Skin: Skin is warm and dry. No erythema. No pallor.  Psychiatric: Her behavior is normal. Judgment normal.     Assessment/Plan Acute cholecystitis with cholelithiasis  Plan will be to admit the patient to the hospital and proceed to the operating room for laparoscopic cholecystectomy and possible cholangiogram. I discussed this with the patient in detail. I discussed the risks of surgery which includes but is not limited to bleeding, infection, bile duct injury, bile leak, injury to other structures, need to convert to an open procedure, etc. She understands and wishes to proceed. Surgery will both be scheduled. Preoperative IV antibiotics will be given. Likelihood of success is good  Charna Neeb A 01/17/2012, 1:30 PM

## 2012-01-17 NOTE — Anesthesia Preprocedure Evaluation (Addendum)
Anesthesia Evaluation  Patient identified by MRN, date of birth, ID band Patient awake    Reviewed: Allergy & Precautions, H&P , NPO status , Patient's Chart, lab work & pertinent test results  Airway Mallampati: II  Neck ROM: Full    Dental  (+) Edentulous Upper and Teeth Intact   Pulmonary          Cardiovascular hypertension, Pt. on medications     Neuro/Psych    GI/Hepatic   Endo/Other    Renal/GU      Musculoskeletal   Abdominal   Peds  Hematology   Anesthesia Other Findings   Reproductive/Obstetrics                         Anesthesia Physical Anesthesia Plan  ASA: II  Anesthesia Plan: General   Post-op Pain Management:    Induction: Intravenous  Airway Management Planned: Oral ETT  Additional Equipment:   Intra-op Plan:   Post-operative Plan: Extubation in OR  Informed Consent: I have reviewed the patients History and Physical, chart, labs and discussed the procedure including the risks, benefits and alternatives for the proposed anesthesia with the patient or authorized representative who has indicated his/her understanding and acceptance.   Dental advisory given  Plan Discussed with: CRNA  Anesthesia Plan Comments:        Anesthesia Quick Evaluation

## 2012-01-17 NOTE — ED Notes (Signed)
CALLED OR TO GIVE REPORT. SPOKE WITH CARRIE

## 2012-01-17 NOTE — ED Notes (Signed)
Complains of epigastric pain onset last ngiht, vomiting up bile, sts feels like a spasm.

## 2012-01-17 NOTE — ED Notes (Signed)
Patient transported to Ultrasound 

## 2012-01-17 NOTE — ED Notes (Signed)
Pt wanting something to drink but is still having pains in her stomach area and feeling "a little" nauseated; informed Irving Burton, PA and notified pt that she cannot have anything to drink at the moment; now pt wanting pain medicine; Irving Burton, [place] notified

## 2012-01-17 NOTE — ED Provider Notes (Signed)
History     CSN: [id number]  Arrival date & time 01/17/12  0750   First MD Initiated Contact with Patient 01/17/12 [phone]      Chief Complaint  Patient presents with  . Abdominal Pain    (Consider location/radiation/quality/duration/timing/severity/associated sxs/prior treatment) HPI Comments: Patient reports she began having upper abdominal pain that began at 11:30pm, around 2:00am patient began vomiting.  Emesis was initially contents of her stomach and then has become yellow.  Abdominal pain is described as "a spasm."  Has vomited a total of 4-5 times. States she ate dinner at 4-5pm and a few pork skins at 8pm.  Denies eating anything out of the ordinary, denies sick contacts.  Denies fevers, diarrhea.  Last BM was yesterday morning and was normal.  No hx abdominal surgeries.  Denies CP, SOB, cough, lightheadedness, weakness.    Patient is a 51 y.o. female presenting with abdominal pain. The history is provided by the patient.  Abdominal Pain The primary symptoms of the illness include abdominal pain, nausea and vomiting. The primary symptoms of the illness do not include fever, shortness of breath, diarrhea, hematemesis or dysuria.    Past Medical History  Diagnosis Date  . Bronchitis   . Hypertension   . Arthritis     Past Surgical History  Procedure Date  . Partial hysterectomy   . Abdominal hysterectomy     Family History  Problem Relation Age of Onset  . Hypertension Other   . Diabetes Other   . Asthma Other   . Liver cancer Father   . Diabetes type II Sister   . Diabetes type II Brother     History  Substance Use Topics  . Smoking status: Current Everyday Smoker  . Smokeless tobacco: Not on file  . Alcohol Use: No    OB History    Grav Para Term Preterm Abortions TAB SAB Ect Mult Living                  Review of Systems  Constitutional: Negative for fever.  Respiratory: Negative for shortness of breath.   Gastrointestinal: Positive for nausea,  vomiting and abdominal pain. Negative for diarrhea and hematemesis.  Genitourinary: Negative for dysuria.  All other systems reviewed and are negative.    Allergies  Review of patient's allergies indicates no known allergies.  Home Medications   Current Outpatient Rx  Name Route Sig Dispense Refill  . ATENOLOL-CHLORTHALIDONE 50-25 MG PO TABS Oral Take 1 tablet by mouth daily.      Marland Kitchen VITAMIN D3 2000 UNITS PO TABS Oral Take 2,000 Units by mouth every morning.    Marland Kitchen DICLOFENAC SODIUM 75 MG PO TBEC Oral Take 75 mg by mouth 2 (two) times daily.    . ADULT MULTIVITAMIN W/MINERALS CH Oral Take 1 tablet by mouth daily.    Marland Kitchen PANTOPRAZOLE SODIUM 40 MG PO TBEC Oral Take 1 tablet (40 mg total) by mouth daily at 12 noon. 30 tablet 0    BP 145/92  Pulse 88  Temp 97.6 F (36.4 C)  Resp 20  Ht 5\' 2"  (1.575 m)  Wt 180 lb (81.647 kg)  BMI 32.92 kg/m2  SpO2 99%  Physical Exam  Nursing note and vitals reviewed. Constitutional: She is oriented to person, place, and time. She appears well-developed and well-nourished.  HENT:  Head: Normocephalic and atraumatic.  Neck: Neck supple.  Cardiovascular: Normal rate, regular rhythm and normal heart sounds.   Pulmonary/Chest: Breath sounds normal. No respiratory distress. She  has no wheezes. She has no rales. She exhibits no tenderness.  Abdominal: Soft. Bowel sounds are normal. She exhibits no distension and no mass. There is tenderness. There is no rebound, no guarding and no CVA tenderness.    Neurological: She is alert and oriented to person, place, and time.    ED Course  Procedures (including critical care time)  Labs Reviewed  CBC - Abnormal; Notable for the following:    WBC 17.0 (*)    RBC 5.29 (*)    Hemoglobin 15.9 (*)    All other components within normal limits  DIFFERENTIAL - Abnormal; Notable for the following:    Neutrophils Relative 79 (*)    Neutro Abs 13.3 (*)    All other components within normal limits  COMPREHENSIVE  METABOLIC PANEL - Abnormal; Notable for the following:    Glucose, Bld 123 (*)    ALT 71 (*)    All other components within normal limits  LIPASE, BLOOD   US Abdomen Complete  01/17/2012  *RADIOLOGY REPORT*  Clinical Data:  Upper abdominal pain, nausea/vomiting  COMPLETE ABDOMINAL ULTRASOUND  Comparison:  [facility] [place] dated 05/23/2007  Findings:  Gallbladder:  Cholelithiasis, including a 1.3 cm gallstone lodged in the gallbladder neck.  Associated irregular gallbladder wall thickening/pericholecystic fluid.  Negative sonographic Murphy's sign.  Common bile duct:  Measures 5 mm.  Liver:  Hyperechoic hepatic parenchyma, suggesting hepatic steatosis.  IVC:  Appears normal.  Pancreas:  Incompletely visualized but grossly unremarkable.  Spleen:  Measures 6.8 cm.  Right Kidney:  Measures 12.0 cm.  No mass or hydronephrosis.  Left Kidney:  Measures 11.7 cm.  No mass or hydronephrosis.  Abdominal aorta:  No aneurysm identified.  IMPRESSION: Cholelithiasis with irregular gallbladder wall thickening/pericholecystic fluid.  Despite the absence of a sonographic Murphy's sign, this appearance is suspicious for acute cholecystitis.  Original Report Authenticated By: Charline Bills, M.D.    8:24 AM Patient seen and examined, labs, IVF, zofran ordered.   12:43 PM Patient continues to have pain (7/10), nausea resolved.  Discussed with Dr Effie Shy.  Patient has WBC count 17, cholelithiasis with gallbladder wall thickening and pericholecystic fluid.  Dr Effie Shy to consult surgery.   Pt states she attempted to drink water and sprite this morning but vomited, last food intake was last night around 8pm.  No diagnosis found.  Cholelithiasis with cholecystitis.      MDM  Patient with upper abdominal pain, N/V.  Patient found to have WBC count 17, US showing cholelithiasis with cholecystitis.  Dr Effie Shy discussed case with surgery who will admit the patient.         Dillard Cannon [place], [place] 01/17/12 1615

## 2012-01-17 NOTE — Anesthesia Postprocedure Evaluation (Signed)
Anesthesia Post Note  Patient: Sonya Walton  Procedure(s) Performed: Procedure(s) (LRB): LAPAROSCOPIC CHOLECYSTECTOMY (N/A)  Anesthesia type: general  Patient location: PACU  Post pain: Pain level controlled  Post assessment: Patient's Cardiovascular Status Stable  Last Vitals:  Filed Vitals:   01/17/12 1600  BP: 154/81  Pulse: 74  Temp:   Resp: 24    Post vital signs: Reviewed and stable  Level of consciousness: sedated  Complications: No apparent anesthesia complications

## 2012-01-17 NOTE — ED Notes (Addendum)
Transported patient to CDU 3.

## 2012-01-17 NOTE — ED Provider Notes (Signed)
Medical screening examination/treatment/procedure(s) were conducted as a shared visit with non-physician practitioner(s) and myself.  I personally evaluated the patient during the encounter  Ill since yesterday with upper abdominal radiating to back pain. Evaluation in ED is consistent with acute cholecystitis and gall stones, with persistent pain  she'll need to be addressed with likely urgent cholecystectomy. No evidence for gallstone pancreatitis, sepsis, metabolic instability.   Plan: Admit- General Surgery  Flint Melter, MD 01/17/12 [phone]

## 2012-01-17 NOTE — Transfer of Care (Signed)
Immediate Anesthesia Transfer of Care Note  Patient: Sonya Walton  Procedure(s) Performed: Procedure(s) (LRB): LAPAROSCOPIC CHOLECYSTECTOMY (N/A)  Patient Location: PACU  Anesthesia Type: General  Level of Consciousness: awake  Airway & Oxygen Therapy: Patient Spontanous Breathing and Patient connected to nasal cannula oxygen  Post-op Assessment: Report given to PACU RN, Post -op Vital signs reviewed and stable and Patient moving all extremities  Post vital signs: Reviewed and stable  Complications: No apparent anesthesia complications

## 2012-01-17 NOTE — ED Notes (Signed)
Pt changing into a gown; family at bedside

## 2012-01-17 NOTE — Op Note (Signed)
Laparoscopic Cholecystectomy Procedure Note  Indications: This patient presents with symptomatic gallbladder disease and will undergo laparoscopic cholecystectomy.  Pre-operative Diagnosis: Calculus of gallbladder with acute cholecystitis, without mention of obstruction  Post-operative Diagnosis: Same  Surgeon: Shelly Rubenstein   Assistants: Jettie Pagan, PA  Anesthesia: General endotracheal anesthesia  ASA Class: 2  Procedure Details  The patient was seen again in the Holding Room. The risks, benefits, complications, treatment options, and expected outcomes were discussed with the patient. The possibilities of reaction to medication, pulmonary aspiration, perforation of viscus, bleeding, recurrent infection, finding a normal gallbladder, the need for additional procedures, failure to diagnose a condition, the possible need to convert to an open procedure, and creating a complication requiring transfusion or operation were discussed with the patient. The likelihood of improving the patient's symptoms with return to their baseline status is good.  The patient and/or family concurred with the proposed plan, giving informed consent. The site of surgery properly noted. The patient was taken to Operating Room, identified as Sonya Walton and the procedure verified as Laparoscopic Cholecystectomy with Intraoperative Cholangiogram. A Time Out was held and the above information confirmed.  Prior to the induction of general anesthesia, antibiotic prophylaxis was administered. General endotracheal anesthesia was then administered and tolerated well. After the induction, the abdomen was prepped with Chloraprep and draped in sterile fashion. The patient was positioned in the supine position.  Local anesthetic agent was injected into the skin near the umbilicus and an incision made. We dissected down to the abdominal fascia with blunt dissection.  The fascia was incised vertically and we entered the  peritoneal cavity bluntly.  A pursestring suture of 0-Vicryl was placed around the fascial opening.  The Hasson cannula was inserted and secured with the stay suture.  Pneumoperitoneum was then created with CO2 and tolerated well without any adverse changes in the patient's vital signs. An 11-mm port was placed in the subxiphoid position.  Two 5-mm ports were placed in the right upper quadrant. All skin incisions were infiltrated with a local anesthetic agent before making the incision and placing the trocars.   We positioned the patient in reverse Trendelenburg, tilted slightly to the patient's left.  The gallbladder was identified, the fundus grasped and retracted cephalad. Adhesions were lysed bluntly and with the electrocautery where indicated, taking care not to injure any adjacent organs or viscus. The infundibulum was grasped and retracted laterally, exposing the peritoneum overlying the triangle of Calot. This was then divided and exposed in a blunt fashion. The cystic duct was clearly identified and bluntly dissected circumferentially. A critical view of the cystic duct and cystic artery was obtained.  The cystic duct was then ligated with clips and divided. The cystic artery was, dissected free, ligated with clips and divided as well.   The gallbladder was dissected from the liver bed in retrograde fashion with the electrocautery. The gallbladder was removed and placed in an Endocatch sac. The liver bed was irrigated and inspected. Hemostasis was achieved with the electrocautery. Copious irrigation was utilized and was repeatedly aspirated until clear.  The gallbladder and Endocatch sac were then removed through the umbilical port site.  The pursestring suture was used to close the umbilical fascia.    We again inspected the right upper quadrant for hemostasis.  Pneumoperitoneum was released as we removed the trocars.  4-0 Monocryl was used to close the skin.   Benzoin, steri-strips, and clean  dressings were applied. The patient was then extubated and brought to  the recovery room in stable condition. Instrument, sponge, and needle counts were correct at closure and at the conclusion of the case.   Findings: Cholecystitis with Cholelithiasis  Estimated Blood Loss: Minimal         Drains: 0         Specimens: Gallbladder           Complications: None; patient tolerated the procedure well.         Disposition: PACU - hemodynamically stable.         Condition: stable

## 2012-01-17 NOTE — Preoperative (Signed)
Beta Blockers   Reason not to administer Beta Blockers:Not Applicable 

## 2012-01-18 ENCOUNTER — Encounter (HOSPITAL_COMMUNITY): Payer: Self-pay | Admitting: Surgery

## 2012-01-18 ENCOUNTER — Ambulatory Visit: Payer: Self-pay

## 2012-01-18 MED ORDER — PROMETHAZINE HCL 12.5 MG PO TABS: 12.5000 mg | ORAL_TABLET | Freq: Four times a day (QID) | ORAL | Status: AC | PRN

## 2012-01-18 MED ORDER — HYDROCODONE-ACETAMINOPHEN 5-325 MG PO TABS: 1.0000 | ORAL_TABLET | ORAL | Status: AC | PRN

## 2012-01-18 MED ORDER — ALUM & MAG HYDROXIDE-SIMETH 200-200-20 MG/5ML PO SUSP
30.0000 mL | ORAL | Status: DC | PRN
  Administered 2012-01-18: 30 mL via ORAL
  Filled 2012-01-18: qty 30

## 2012-01-18 MED ORDER — HYDROCODONE-ACETAMINOPHEN 5-325 MG PO TABS
1.0000 | ORAL_TABLET | ORAL | Status: DC | PRN
  Administered 2012-01-18: 1 via ORAL
  Filled 2012-01-18: qty 2

## 2012-01-18 MED FILL — Hydromorphone HCl Inj 1 MG/ML: INTRAMUSCULAR | Qty: 1 | Status: AC

## 2012-01-18 NOTE — Progress Notes (Signed)
Patient discharged to home in care of spouse. Medications and instructions reviewed with patient with no questions. IV d/c'd with cath intact. Assessment unchanged from this am. Patient is to follow up with Dr. Magnus Ivan in 3 weeks.

## 2012-01-18 NOTE — Progress Notes (Signed)
Pt complaining of pain and RN brought Percocet to the room.  Pt stated that she had Percocet a few years ago and did not tolerate it well.  Also, the Dilaudid made the patient nauseated.  Notified MD, Dr. Carolynne Edouard, to see if she could have another prn PO medication to prepare her for discharge.  MD aggravated at RN for calling and said that the issue could be taken care of in the morning.

## 2012-01-18 NOTE — Discharge Summary (Signed)
I have seen and examined the patient and agree with the assessment and plans.  Mialee Weyman A. Armine Rizzolo  MD, FACS  

## 2012-01-18 NOTE — Discharge Summary (Signed)
Patient ID: Sonya Walton MRN: [id number] DOB/AGE: 12-10-1960 51 y.o.  Admit date: 01/17/2012 Discharge date: 01/18/2012  Procedures: laparoscopic cholecystectomy  Consults: None  Reason for Admission: This is a 51 yo female who presented to [facility] yesterday secondary to RUQ abdominal pain.  She was found to have acute cholecystitis.  Admission Diagnoses:  1. Acute cholecystitis  Hospital Course: The patient was admitted and taken to the operating room where she underwent a cholecystectomy.  She tolerated the procedure well.  She did have some nausea on POD# 1 after taking a narcotic on an empty stomach.  This was fixed and later in the day her diet was advanced as tolerated.  She was then stable for discharge home after eating well and feeling better after lunch.  Discharge Diagnoses:  Active Problems:  Cholecystitis, acute with cholelithiasis s/p lap chole  Discharge Medications: Medication List  As of 01/18/2012 10:08 AM   STOP taking these medications         acetaminophen 500 MG tablet      pantoprazole 40 MG tablet         TAKE these medications         atenolol-chlorthalidone 50-25 MG per tablet   Commonly known as: TENORETIC   Take 1 tablet by mouth daily.      diclofenac 75 MG EC tablet   Commonly known as: VOLTAREN   Take 75 mg by mouth 2 (two) times daily.      HYDROcodone-acetaminophen 5-325 MG per tablet   Commonly known as: NORCO   Take 1-2 tablets by mouth every 4 (four) hours as needed.      mulitivitamin with minerals Tabs   Take 1 tablet by mouth daily.      promethazine 12.5 MG tablet   Commonly known as: PHENERGAN   Take 1-2 tablets (12.5-25 mg total) by mouth every 6 (six) hours as needed for nausea.      Vitamin D3 2000 UNITS Tabs   Take 2,000 Units by mouth every morning.            Discharge Instructions: Follow-up Information    Follow up with [facility], MD.      Follow up with [facility], MD. Schedule an  appointment as soon as possible for a visit in 3 weeks.   Contact information:   [employer] [address] [place] [address] [phone]          Signed: Letha Cape 01/18/2012, 10:08 AM

## 2012-01-18 NOTE — Discharge Instructions (Signed)
[facility] ______[facility] °LAPAROSCOPIC SURGERY: POST OP INSTRUCTIONS °Always review your discharge instruction sheet given to you by the facility where your surgery was performed. °IF YOU HAVE DISABILITY OR FAMILY LEAVE FORMS, YOU MUST BRING THEM TO THE OFFICE FOR PROCESSING.   °DO NOT GIVE THEM TO YOUR DOCTOR. ° °1. A prescription for pain medication may be given to you upon discharge.  Take your pain medication as prescribed, if needed.  If narcotic pain medicine is not needed, then you may take acetaminophen (Tylenol) or ibuprofen (Advil) as needed. °2. Take your usually prescribed medications unless otherwise directed. °3. If you need a refill on your pain medication, please contact your pharmacy.  They will contact our office to request authorization. Prescriptions will not be filled after 5pm or on week-ends. °4. You should follow a light diet the first few days after arrival home, such as soup and crackers, etc.  Be sure to include lots of fluids daily. °5. Most patients will experience some swelling and bruising in the area of the incisions.  Ice packs will help.  Swelling and bruising can take several days to resolve.  °6. It is common to experience some constipation if taking pain medication after surgery.  Increasing fluid intake and taking a stool softener (such as Colace) will usually help or prevent this problem from occurring.  A mild laxative (Milk of Magnesia or Miralax) should be taken according to package instructions if there are no bowel movements after 48 hours. °7. Unless discharge instructions indicate otherwise, you may remove your bandages 24-48 hours after surgery, and you may shower at that time.  You may have steri-strips (small skin tapes) in place directly over the incision.  These strips should be left on the skin for 7-10 days.  If your surgeon used skin glue on the incision, you may shower in 24 hours.  The glue will flake off over the next 2-3 weeks.  Any sutures or  staples will be removed at the office during your follow-up visit. °8. ACTIVITIES:  You may resume regular (light) daily activities beginning the next day--such as daily self-care, walking, climbing stairs--gradually increasing activities as tolerated.  You may have sexual intercourse when it is comfortable.  Refrain from any heavy lifting or straining until approved by your doctor. °a. You may drive when you are no longer taking prescription pain medication, you can comfortably wear a seatbelt, and you can safely maneuver your car and apply brakes. °b. RETURN TO WORK:  __________________________________________________________ °9. You should see your doctor in the office for a follow-up appointment approximately 2-3 weeks after your surgery.  Make sure that you call for this appointment within a day or two after you arrive home to insure a convenient appointment time. °10. OTHER INSTRUCTIONS: __________________________________________________________________________________________________________________________ __________________________________________________________________________________________________________________________ °WHEN TO CALL YOUR DOCTOR: °1. Fever over 101.0 °2. Inability to urinate °3. Continued bleeding from incision. °4. Increased pain, redness, or drainage from the incision. °5. Increasing abdominal pain ° °The clinic staff is available to answer your questions during regular business hours.  Please don’t hesitate to call and ask to speak to one of the nurses for clinical concerns.  If you have a medical emergency, go to the nearest emergency room or call 911.  A surgeon from [facility] is always on call at the hospital. °[address], [address], [place], [place]  [address] ? [address] [address], [place], [place]   [address] °[phone] ? [phone] ? FAX [phone] °Web site:   [email or web address] °

## 2012-01-24 ENCOUNTER — Ambulatory Visit
Admission: RE | Admit: 2012-01-24 | Discharge: 2012-01-24 | Disposition: A | Discharge status: Home / Self Care | Payer: Self-pay | Source: Ambulatory Visit | Attending: Physician Assistant | Admitting: Physician Assistant

## 2012-01-24 DIAGNOSIS — R928 Other abnormal and inconclusive findings on diagnostic imaging of breast: Secondary | ICD-10-CM

## 2012-02-04 ENCOUNTER — Encounter (INDEPENDENT_AMBULATORY_CARE_PROVIDER_SITE_OTHER): Payer: Self-pay | Admitting: Surgery

## 2012-02-08 ENCOUNTER — Ambulatory Visit (INDEPENDENT_AMBULATORY_CARE_PROVIDER_SITE_OTHER): Payer: PRIVATE HEALTH INSURANCE | Admitting: Surgery

## 2012-02-08 ENCOUNTER — Encounter (INDEPENDENT_AMBULATORY_CARE_PROVIDER_SITE_OTHER): Payer: Self-pay | Admitting: Surgery

## 2012-02-08 VITALS — BP 116/78 | HR 68 | Temp 97.0°F | Resp 16 | Ht 62.0 in | Wt 184.2 lb

## 2012-02-08 DIAGNOSIS — Z09 Encounter for follow-up examination after completed treatment for conditions other than malignant neoplasm: Secondary | ICD-10-CM

## 2012-02-08 NOTE — Progress Notes (Signed)
Subjective:     Patient ID: Sonya Walton, female   DOB: 1961-01-26, 51 y.o.   MRN: [id number]  HPI She is here for her first postoperative visit status post laparoscopic cholecystectomy. She is doing well and has no complaints  Review of Systems     Objective:   Physical Exam Her incisions are well-healed. The pathology showed acute and chronic cholecystitis with gallstones    Assessment:     Patient did well status post laparoscopic cholecystectomy    Plan:     She may return to normal activity. I will see her back as needed

## 2012-06-13 ENCOUNTER — Emergency Department (INDEPENDENT_AMBULATORY_CARE_PROVIDER_SITE_OTHER)
Admission: EM | Admit: 2012-06-13 | Discharge: 2012-06-13 | Disposition: A | Discharge status: Home / Self Care | Payer: Self-pay | Source: Home / Self Care | Attending: Emergency Medicine | Admitting: Emergency Medicine

## 2012-06-13 ENCOUNTER — Encounter (HOSPITAL_COMMUNITY): Payer: Self-pay | Admitting: Emergency Medicine

## 2012-06-13 DIAGNOSIS — M1990 Unspecified osteoarthritis, unspecified site: Secondary | ICD-10-CM

## 2012-06-13 DIAGNOSIS — M129 Arthropathy, unspecified: Secondary | ICD-10-CM

## 2012-06-13 NOTE — ED Notes (Signed)
Requesting note for a job reporting no limitations or restrictions.  [facility] is a patient at [facility] and no one available to write a work related note for a job.  No issues

## 2012-06-13 NOTE — ED Provider Notes (Signed)
Chief Complaint  Patient presents with  . Arthritis    History of Present Illness:   Mrs. Sonya Walton comes in today to get a note for new employment. 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 She has been diagnosed with arthritis at [facility], but unfortunate now they're closed. Her joint symptoms include her right elbow and right shoulder. These were diagnosed about 2 years ago. Symptoms come and go and she's been taking diclofenac. Right now her symptoms are minimal. She denies any swelling or limited range of motion. She has no pain with movement. All the other joints are okay including no pain in her neck, lower back, wrists, hands, left shoulder elbow, hips, knees, ankles, or feet.  Review of Systems:  Other than noted above, the patient denies any of the following symptoms: Systemic:  No fevers, chills, sweats, or aches.  No fatigue or tiredness. Musculoskeletal:  No joint pain, arthritis, bursitis, swelling, back pain, or neck pain. Neurological:  No muscular weakness, paresthesias, headache, or trouble with speech or coordination.  No dizziness.   PMFSH:  Past medical history, family history, social history, meds, and allergies were reviewed.  Physical Exam:   Vital signs:  BP 134/88  Pulse 72  Temp 99.3 F (37.4 C) (Oral)  Resp 16  SpO2 94% Gen:  Alert and oriented times 3.  In no distress. Musculoskeletal: A complete shunt survey was done. All of her joints have a full range of motion without any pain. There's no pain to palpation or deformity. Particularly, her right shoulder and right elbow have no pain to palpation, no swelling, and full range of motion with no pain or Otherwise, all joints had a full a ROM with no swelling, bruising or deformity.  No edema, pulses full. Extremities were warm and pink.  Capillary refill was brisk.  Skin:  Clear, warm and dry.  No rash. Neuro:  Alert and  oriented times 3.  Muscle strength was normal.  Sensation was intact to light touch.   Assessment:  The encounter diagnosis was Arthritis. This is the first time that I have seen her and I have no access to her previous records. Right now however she seems to be asymptomatic and all of her joints have a full range of motion with no pain. I can see no reason why she should not get the job. And there is no limitation as to her lifting.  Plan:   1.  The following meds were prescribed:   New Prescriptions   No medications on file   2.  The patient was instructed in symptomatic care, including rest and activity, elevation, application of ice and compression.  Appropriate handouts were given. 3.  The patient was told to return if becoming worse in any way, if no better in 3 or 4 days, and given some red flag symptoms that would indicate earlier return.   4.  The patient was told to follow up with a primary care physician as soon as possible.   Reuben Likes, MD 06/13/12 [phone]

## 2012-09-13 ENCOUNTER — Emergency Department (INDEPENDENT_AMBULATORY_CARE_PROVIDER_SITE_OTHER)
Admission: EM | Admit: 2012-09-13 | Discharge: 2012-09-13 | Disposition: A | Discharge status: Home / Self Care | Payer: BC Managed Care – PPO | Source: Home / Self Care | Attending: Emergency Medicine | Admitting: Emergency Medicine

## 2012-09-13 ENCOUNTER — Encounter (HOSPITAL_COMMUNITY): Payer: Self-pay

## 2012-09-13 DIAGNOSIS — I10 Essential (primary) hypertension: Secondary | ICD-10-CM

## 2012-09-13 DIAGNOSIS — Z760 Encounter for issue of repeat prescription: Secondary | ICD-10-CM

## 2012-09-13 MED ORDER — ATENOLOL-CHLORTHALIDONE 50-25 MG PO TABS: 1.0000 | ORAL_TABLET | Freq: Every day | ORAL | Status: DC

## 2012-09-13 NOTE — ED Provider Notes (Signed)
History     CSN: [id number]  Arrival date & time 09/13/12  1533   First MD Initiated Contact with Patient 09/13/12 1601      Chief Complaint  Patient presents with  . Medication Refill    (Consider location/radiation/quality/duration/timing/severity/associated sxs/prior treatment) HPI Comments: Patient presents urgent care requesting a refill of her blood pressure medicine that she is running out of and only has 3 pills left. She denies any symptomatology but is getting over a recent cold. Denies any fevers shortness of breath has a lingering cough and congestion.  The history is provided by the patient.    Past Medical History  Diagnosis Date  . Bronchitis   . Hypertension   . Arthritis     Past Surgical History  Procedure Date  . Partial hysterectomy   . Abdominal hysterectomy   . Tubal ligation 1981  . Cholecystectomy 01/17/2012    Procedure: LAPAROSCOPIC CHOLECYSTECTOMY;  Surgeon: Shelly Rubenstein, MD;  Location: [facility] OR;  Service: General;  Laterality: N/A;    Family History  Problem Relation Age of Onset  . Hypertension Other   . Diabetes Other   . Asthma Other   . Liver cancer Father   . Cancer Father     lung and liver  . Diabetes type II Sister   . Diabetes type II Brother   . Heart disease Mother   . Cancer Paternal Aunt     lung    History  Substance Use Topics  . Smoking status: Current Every Day Smoker -- 1.0 packs/day for 31 years    Types: Cigarettes  . Smokeless tobacco: Not on file     Comment: has smoking cessation info from last admission  . Alcohol Use: No     Comment: once or twice per year    OB History    Grav Para Term Preterm Abortions TAB SAB Ect Mult Living                  Review of Systems  Constitutional: Negative for chills, diaphoresis, activity change and appetite change.  HENT: Negative for neck pain and neck stiffness.   Eyes: Negative for redness and visual disturbance.  Respiratory: Negative for cough and  shortness of breath.   Cardiovascular: Negative for chest pain, palpitations and leg swelling.  Genitourinary: Negative for dysuria.  Skin: Negative for color change and rash.  Neurological: Negative for dizziness and headaches.    Allergies  Review of patient's allergies indicates no known allergies.  Home Medications   Current Outpatient Rx  Name  Route  Sig  Dispense  Refill  . ATENOLOL-CHLORTHALIDONE 50-25 MG PO TABS   Oral   Take 1 tablet by mouth daily.   30 tablet   1   . VITAMIN D3 2000 UNITS PO TABS   Oral   Take 2,000 Units by mouth every morning.         Marland Kitchen DICLOFENAC SODIUM 75 MG PO TBEC   Oral   Take 75 mg by mouth 2 (two) times daily.         . ADULT MULTIVITAMIN W/MINERALS CH   Oral   Take 1 tablet by mouth daily.           BP 126/81  Pulse 75  Temp 98.5 F (36.9 C) (Oral)  Resp 20  SpO2 98%  Physical Exam  Nursing note and vitals reviewed. Constitutional: She is oriented to person, place, and time. Vital signs are normal. She appears well-developed  and well-nourished.  Non-toxic appearance. She does not have a sickly appearance. She does not appear ill. No distress.  HENT:  Head: Normocephalic.  Eyes: Pupils are equal, round, and reactive to light.  Cardiovascular: Normal rate, regular rhythm, normal heart sounds and normal pulses.  Exam reveals no gallop and no friction rub.   No murmur heard. Pulmonary/Chest: Effort normal and breath sounds normal.  Musculoskeletal: She exhibits no edema.  Neurological: She is alert and oriented to person, place, and time.  Skin: No rash noted. No erythema.    ED Course  Procedures (including critical care time)  Labs Reviewed - No data to display No results found.   1. Encounter for medication refill   2. Hypertension       MDM  Request for refill for a antihypertensive medicine. Patient has already scheduled appointment to be established with family practice for continuity of care. We have  provided her with a refill of her Tenoretic. Patient is asymptomatic during today's urgent care visit.        Jimmie Molly, MD 09/13/12 [phone]

## 2012-09-13 NOTE — ED Notes (Signed)
Needs refills on her BP pills ; pt of [facility]; also has cold

## 2012-09-25 ENCOUNTER — Encounter: Payer: Self-pay | Admitting: Family Medicine

## 2012-10-05 ENCOUNTER — Encounter: Payer: Self-pay | Admitting: Family Medicine

## 2012-10-05 ENCOUNTER — Ambulatory Visit (INDEPENDENT_AMBULATORY_CARE_PROVIDER_SITE_OTHER): Payer: BC Managed Care – PPO | Admitting: Family Medicine

## 2012-10-05 VITALS — BP 109/74 | HR 71 | Ht 63.0 in | Wt 184.7 lb

## 2012-10-05 DIAGNOSIS — N644 Mastodynia: Secondary | ICD-10-CM

## 2012-10-05 DIAGNOSIS — I10 Essential (primary) hypertension: Secondary | ICD-10-CM

## 2012-10-05 LAB — LIPID PANEL
Cholesterol: 164 mg/dL (ref 0–200)
Glucose: 120
HDL: 31 mg/dL — AB (ref 35–70)
LDL (calc): 95
Triglycerides: 192

## 2012-10-05 LAB — CBC: Vit D, 25-Hydroxy: 46

## 2012-10-05 NOTE — Assessment & Plan Note (Addendum)
Well controlled.  Will check BMET at f/u in 3 months. Continue atenolol/chlorthalidone 50/25.

## 2012-10-05 NOTE — Progress Notes (Signed)
S: Pt comes in today for new patient visit.  Other issues include: HTN- currently controlled L breast mass- originally biopsied/partially removed in 2004- not a cancer, thinks it was something like a fibrocystic change; has always had ok mammograms until 12/2011, when [facility]- did an U/S and diagnostic mammogram and was told that everything was fine; next mammogram should be 12/2012; no new lumps/bumps, no skin changes, no drainage; occasionally with have some pain in her breast most frequently with sleeping on her left side; pain is usually a little sharp, has it a few times per week, lasts for a few seconds then goes away on its own    ROS: Per HPI  History  Smoking status  . Current Every Day Smoker -- 1.0 packs/day for 31 years  . Types: Cigarettes  Smokeless tobacco  . Not on file    Comment: has smoking cessation info from last admission    O:  Filed Vitals:   10/05/12 1615  BP: 109/74  Pulse: 71    Gen: NAD CV: RRR, no murmur Pulm: CTA bilat, no wheezes or crackles Breast: bilateral breasts without masses; no axillary LAD, no obvious deformities; nipples normal without d/c, breasts symmetric    A/P: 51 y.o. female p/w NP visit, breast pain, HTN -See problem list -f/u in 3 months

## 2012-10-05 NOTE — Patient Instructions (Addendum)
It was nice to meet you today.  I'm not completely sure what's causing the pain in your breast, but there was nothing concerning on your exam.  Keep an eye on it and let me know if it happens more frequently.  Your blood pressure looks great today- we will plan on getting labs when you follow up to check your kidneys.  Come back to see me in about 3 months for your blood pressure, sooner for any issues or concerns.

## 2012-10-05 NOTE — Assessment & Plan Note (Signed)
Unclear etiology, no red flags on history or exam. May just be breast sensitivity.  Will monitor for increase in occurrence. Next mammogram due 12/2012.

## 2012-10-16 ENCOUNTER — Other Ambulatory Visit: Payer: Self-pay | Admitting: Family Medicine

## 2012-10-16 MED ORDER — ATENOLOL-CHLORTHALIDONE 50-25 MG PO TABS: 1.0000 | ORAL_TABLET | Freq: Every day | ORAL | Status: DC

## 2012-10-16 MED ORDER — DICLOFENAC SODIUM 75 MG PO TBEC: 75.0000 mg | DELAYED_RELEASE_TABLET | Freq: Two times a day (BID) | ORAL | Status: DC

## 2012-10-16 NOTE — Telephone Encounter (Signed)
Fwd. To Dr.McGill for refills. Lorenda Hatchet, Renato Battles

## 2012-10-16 NOTE — Telephone Encounter (Signed)
Refilled to [facility]. Thanks!

## 2012-10-16 NOTE — Telephone Encounter (Signed)
Needed to get refill on her diclofenac and atenolol - [facility] hasn't refill these yet, so pharmacy can't send request  [facility]- [place], [place]

## 2012-11-20 ENCOUNTER — Other Ambulatory Visit: Payer: Self-pay | Admitting: Family Medicine

## 2012-11-20 MED ORDER — ATENOLOL-CHLORTHALIDONE 50-25 MG PO TABS: 1.0000 | ORAL_TABLET | Freq: Every day | ORAL | Status: DC

## 2012-11-20 NOTE — Telephone Encounter (Signed)
Patient is calling because [facility] in [place] was to send over a refill request on atenolol-chlorthalidone on Friday and they have not heard back from the doctor on it yet.  They gave her some loners for the weekend, but she is now out of this medication.

## 2012-11-20 NOTE — Telephone Encounter (Signed)
Waiting for call back. Please tell pt: Her medication were sent to the [facility] on [address] in [place]. Pt can have the Rx transferred to her pharmacy of choice. Rx also has 5 additional refills on it. Thank you. Lorenda Hatchet, Renato Battles

## 2013-01-12 ENCOUNTER — Other Ambulatory Visit: Payer: Self-pay | Admitting: Family Medicine

## 2013-01-12 DIAGNOSIS — Z1231 Encounter for screening mammogram for malignant neoplasm of breast: Secondary | ICD-10-CM

## 2013-01-23 ENCOUNTER — Ambulatory Visit: Payer: BC Managed Care – PPO | Admitting: Family Medicine

## 2013-01-23 ENCOUNTER — Ambulatory Visit (HOSPITAL_COMMUNITY)
Admission: RE | Admit: 2013-01-23 | Discharge: 2013-01-23 | Disposition: A | Discharge status: Home / Self Care | Payer: BC Managed Care – PPO | Source: Ambulatory Visit | Attending: Family Medicine | Admitting: Family Medicine

## 2013-01-23 DIAGNOSIS — Z1231 Encounter for screening mammogram for malignant neoplasm of breast: Secondary | ICD-10-CM | POA: Insufficient documentation

## 2013-01-30 ENCOUNTER — Ambulatory Visit: Payer: BC Managed Care – PPO | Admitting: Family Medicine

## 2013-02-23 ENCOUNTER — Ambulatory Visit (INDEPENDENT_AMBULATORY_CARE_PROVIDER_SITE_OTHER): Payer: BC Managed Care – PPO | Admitting: Family Medicine

## 2013-02-23 ENCOUNTER — Encounter: Payer: Self-pay | Admitting: Family Medicine

## 2013-02-23 VITALS — BP 109/72 | HR 79 | Ht 63.0 in | Wt 188.0 lb

## 2013-02-23 DIAGNOSIS — I10 Essential (primary) hypertension: Secondary | ICD-10-CM

## 2013-02-23 DIAGNOSIS — Z23 Encounter for immunization: Secondary | ICD-10-CM

## 2013-02-23 DIAGNOSIS — N644 Mastodynia: Secondary | ICD-10-CM

## 2013-02-23 DIAGNOSIS — F17200 Nicotine dependence, unspecified, uncomplicated: Secondary | ICD-10-CM

## 2013-02-23 DIAGNOSIS — Z720 Tobacco use: Secondary | ICD-10-CM

## 2013-02-23 MED ORDER — ATENOLOL-CHLORTHALIDONE 50-25 MG PO TABS: 1.0000 | ORAL_TABLET | Freq: Every day | ORAL | Status: DC

## 2013-02-23 NOTE — Progress Notes (Signed)
S: Pt comes in today for follow up.  HYPERTENSION BP: 109/72 Meds: atenolol/chlorthalidone 50/25 Taking meds: Yes     # of doses missed/week: 0 Symptoms: Headache: No Dizziness: No Vision changes: No SOB:  No Chest pain: No LE swelling: No Tobacco use: Yes-- 1ppd, still in contemplative state, not ready to make changes yet    L BREAST PAIN Has intermittent L breast pain, normal mammo in 12/2012.  Has has biopsy in 2004 in 6 o'clock position of L breast- diagnosed with benign fibroadenoma; seem on 2013 mammogram, same position; not noted on 2014.  Sister was diagnosed with breast cancer when she was 14- doing well now. Pain is sporadic-- sometimes when she bends over, usually achy.  Sometimes comes out of no where.  Doesn't take anything for it.  Usually a few times per week, sometimes will be every day; only lasts for a few minutes.  Is decreasing her caffeine intake.  No pain with palpation. No chest pain or SOB, no palpitations, no reflux symptoms (no nausea, no burping).  Is perimenopausal (no periods since 2005 when she had hysterectomy for fibroids and heavy bleeding).  Pain has not changed or gotten worse, it's "just there."  Not bad enough to need to come in to be seen for it specifically, doesn't need any pain medicines.      ROS: Per HPI  History  Smoking status  . Current Every Day Smoker -- 1.00 packs/day for 31 years  . Types: Cigarettes  Smokeless tobacco  . Never Used    Comment: has smoking cessation info from last admission    O:  Filed Vitals:   02/23/13 1555  BP: 109/72  Pulse: 79    Gen: NAD CV: RRR, no murmur Pulm: CTA bilat, no wheezes or crackles Ext: Warm, no edema L breast: normal in appearance, no overlying skin changes, no redness, no masses appreciated    A/P: 52 y.o. female p/w HTN, L breast pain -See problem list -f/u in 6 months or PRN

## 2013-02-23 NOTE — Assessment & Plan Note (Signed)
Still unclear etiology.  Mammogram normal last month.  Encouraged her to keep diary of pain.  Could try reflux medication in the future, but pt reports it does not bother her enough o do anything or start a medicine at this time.  She is most aware of it because her sister had breast cancer at age 53.

## 2013-02-23 NOTE — Assessment & Plan Note (Signed)
still in contemplative state, not ready to make changes yet; aware that we are here to support her when she is ready to make a change.

## 2013-02-23 NOTE — Assessment & Plan Note (Signed)
Very well controlled, no hypotensive episodes. Continue atenolol/chlorthalidone 50/25. 1 year supply given. F/u 3-6 months, could check BMET at that visit since last was 12/2011.

## 2013-02-23 NOTE — Patient Instructions (Addendum)
It was good to see you today!  Your blood pressure is perfect.  I will send in a year's worth of blood pressure medicine.  We will check your kidneys the next time you come.  If you want, you can keep a journal of how often you are having breast pain, how bad the pain is, what is feels like, what you are doing when it starts, and how long it lasts for.  Your mammogram was 100% normal last month.  If you want, we can try a reflux medicine in the future to see if that helps.  If you don't want to take the diclofenac, you don't have to-- this is to help with your arthritis pain, but if you aren't having much pain, you don't need to take the medicine.  We gave you your tetanus shot today.  Come back in 3-6 months.

## 2013-05-11 ENCOUNTER — Other Ambulatory Visit: Payer: BC Managed Care – PPO

## 2013-05-28 ENCOUNTER — Ambulatory Visit: Payer: BC Managed Care – PPO | Admitting: Family Medicine

## 2013-06-19 ENCOUNTER — Other Ambulatory Visit: Payer: Self-pay | Admitting: Family Medicine

## 2013-06-29 ENCOUNTER — Ambulatory Visit: Payer: BC Managed Care – PPO | Admitting: Family Medicine

## 2013-07-25 ENCOUNTER — Ambulatory Visit: Payer: BC Managed Care – PPO | Admitting: Family Medicine

## 2013-08-17 ENCOUNTER — Ambulatory Visit (INDEPENDENT_AMBULATORY_CARE_PROVIDER_SITE_OTHER): Payer: BC Managed Care – PPO | Admitting: Family Medicine

## 2013-08-17 ENCOUNTER — Encounter: Payer: Self-pay | Admitting: Family Medicine

## 2013-08-17 VITALS — BP 135/86 | HR 91 | Ht 63.0 in | Wt 180.5 lb

## 2013-08-17 DIAGNOSIS — M1990 Unspecified osteoarthritis, unspecified site: Secondary | ICD-10-CM

## 2013-08-17 DIAGNOSIS — M542 Cervicalgia: Secondary | ICD-10-CM | POA: Insufficient documentation

## 2013-08-17 DIAGNOSIS — F17200 Nicotine dependence, unspecified, uncomplicated: Secondary | ICD-10-CM

## 2013-08-17 DIAGNOSIS — M129 Arthropathy, unspecified: Secondary | ICD-10-CM

## 2013-08-17 DIAGNOSIS — I10 Essential (primary) hypertension: Secondary | ICD-10-CM

## 2013-08-17 DIAGNOSIS — Z720 Tobacco use: Secondary | ICD-10-CM

## 2013-08-17 LAB — POCT GLYCOSYLATED HEMOGLOBIN (HGB A1C): Hemoglobin A1C: 5.6

## 2013-08-17 NOTE — Assessment & Plan Note (Signed)
1 week of neck pain, tenderness of pec muscle next to clavicle - rec ice/heat, take her diclofenac - if not improved in 3 weeks instructed pt to return

## 2013-08-17 NOTE — Progress Notes (Signed)
  Subjective:    Patient ID: Sonya Walton, female    DOB: 01/02/1961, 52 y.o.   MRN: [id number]  HPI Patient presents for follow up of hypertension.  CHRONIC HYPERTENSION  Disease Monitoring  Blood pressure range: 130s/80s  Chest pain: no   Dyspnea: no   Claudication: no   Medication compliance: yes  Medication Side Effects  Lightheadedness: no   Urinary frequency: no   Edema: no   Impotence: no   Preventitive Healthcare:  Exercise: yes   Diet Pattern: some junk food, trying to increase veggies  Salt Restriction: no     Review of Systems  Respiratory: Negative for cough, chest tightness, shortness of breath and wheezing.   Cardiovascular: Negative for chest pain, palpitations and leg swelling.  Musculoskeletal: Positive for arthralgias, back pain and neck pain. Negative for gait problem, joint swelling, myalgias and neck stiffness.  All other systems reviewed and are negative.       Objective:   Physical Exam  Constitutional: She is oriented to person, place, and time. She appears well-developed and well-nourished. No distress.  HENT:  Head: Normocephalic and atraumatic.  Right Ear: External ear normal.  Left Ear: External ear normal.  Nose: Nose normal.  Eyes: Conjunctivae and EOM are normal. Right eye exhibits no discharge. Left eye exhibits no discharge. No scleral icterus.  Neck: Normal range of motion. Neck supple. No JVD present. No tracheal deviation present. No thyromegaly present.  Cardiovascular: Normal rate, regular rhythm and normal heart sounds.   No murmur heard. Pulmonary/Chest: Effort normal and breath sounds normal. No stridor. No respiratory distress. She has no wheezes. She has no rales. She exhibits no tenderness.  Abdominal: Soft. Bowel sounds are normal. She exhibits no distension. There is no tenderness.  Musculoskeletal: Normal range of motion. She exhibits tenderness. She exhibits no edema.  Mild tenderness of pectoralis muscle next to  left clavicle, no swelling, no bony tenderness  Lymphadenopathy:    She has no cervical adenopathy.  Neurological: She is alert and oriented to person, place, and time. No cranial nerve deficit.  Skin: Skin is warm and dry. She is not diaphoretic. No erythema.  Psychiatric: She has a normal mood and affect. Her behavior is normal. Judgment and thought content normal.          Assessment & Plan:

## 2013-08-17 NOTE — Assessment & Plan Note (Signed)
Pt still contemplative - encouraged her to quit to prevent heart attack/stroke - counseled re: available resources/our willingness to help her

## 2013-08-17 NOTE — Assessment & Plan Note (Signed)
More pain in back and neck after work, patient has not been taking diclofenac bid - rec add second dose of diclofenac as prescribed - ice and heating pads helpful - can try pt in future if continues

## 2013-08-17 NOTE — Patient Instructions (Addendum)
It was great meeting you today and thanks for coming in.   Your blood pressure is well controlled, please keep taking your medication as you have been.  I think your back and neck pain is related to your arthritis and possibly a muscle strain from work. You can try ice or a heating pad as well as taking your diclofenac in the evenings. If it is does not resolve in the next three weeks or these are unsuccessful please come back and we can reevaluate and possibly try physical therapy.  We will get some labs to check you electrolytes and cholesterol.   Thank you,  Dr. Richarda Blade

## 2013-08-17 NOTE — Addendum Note (Signed)
Addended by: [place], Braelon Sprung on: 08/17/2013 03:30 PM   Modules accepted: Orders

## 2013-08-17 NOTE — Assessment & Plan Note (Signed)
Well controlled, 135/86 today - continue atenolol/chlorthalidone - check bmet, lipids and A1C today

## 2013-08-18 LAB — BASIC METABOLIC PANEL
BUN: 15 mg/dL (ref 6–23)
CO2: 27 mEq/L (ref 19–32)
Calcium: 9.3 mg/dL (ref 8.4–10.5)
Chloride: 107 mEq/L (ref 96–112)
Creat: 0.82 mg/dL (ref 0.50–1.10)
Glucose, Bld: 84 mg/dL (ref 70–99)
Potassium: 3.4 mEq/L — ABNORMAL LOW (ref 3.5–5.3)
Sodium: 142 mEq/L (ref 135–145)

## 2013-08-18 LAB — LIPID PANEL
Cholesterol: 162 mg/dL (ref 0–200)
HDL: 39 mg/dL — ABNORMAL LOW (ref >39)
LDL Cholesterol: 90 mg/dL (ref 0–99)
Total CHOL/HDL Ratio: 4.2 Ratio
Triglycerides: 165 mg/dL — ABNORMAL HIGH (ref <150)
VLDL: 33 mg/dL (ref 0–40)

## 2013-08-19 ENCOUNTER — Encounter: Payer: Self-pay | Admitting: Family Medicine

## 2013-08-19 DIAGNOSIS — E876 Hypokalemia: Secondary | ICD-10-CM

## 2013-08-19 MED ORDER — POTASSIUM CHLORIDE CRYS ER 20 MEQ PO TBCR: EXTENDED_RELEASE_TABLET | ORAL | Status: DC

## 2013-08-23 ENCOUNTER — Telehealth: Payer: Self-pay | Admitting: Family Medicine

## 2013-08-23 NOTE — Telephone Encounter (Signed)
Pt called and would like someone to call her about the lab results. JW

## 2013-08-24 NOTE — Telephone Encounter (Signed)
Will fwd to Md.  Denaja Verhoeven L, CMA  

## 2013-08-24 NOTE — Telephone Encounter (Signed)
Patient calls again, inquiring lab results. Please call patient.

## 2013-10-11 ENCOUNTER — Ambulatory Visit: Payer: BC Managed Care – PPO

## 2013-10-11 ENCOUNTER — Ambulatory Visit (INDEPENDENT_AMBULATORY_CARE_PROVIDER_SITE_OTHER): Payer: BC Managed Care – PPO | Admitting: Family Medicine

## 2013-10-11 ENCOUNTER — Encounter: Payer: Self-pay | Admitting: Family Medicine

## 2013-10-11 VITALS — BP 125/82 | HR 70 | Temp 99.0°F | Ht 63.0 in | Wt 188.5 lb

## 2013-10-11 DIAGNOSIS — R109 Unspecified abdominal pain: Secondary | ICD-10-CM

## 2013-10-11 DIAGNOSIS — R1032 Left lower quadrant pain: Secondary | ICD-10-CM

## 2013-10-11 LAB — POCT URINALYSIS DIPSTICK
Bilirubin, UA: NEGATIVE
Glucose, UA: NEGATIVE
Ketones, UA: NEGATIVE
Leukocytes, UA: NEGATIVE
Nitrite, UA: NEGATIVE
Protein, UA: NEGATIVE
Spec Grav, UA: 1.015
Urobilinogen, UA: 0.2
pH, UA: 7

## 2013-10-11 LAB — POCT URINE PREGNANCY: Preg Test, Ur: NEGATIVE

## 2013-10-11 LAB — POCT UA - MICROSCOPIC ONLY

## 2013-10-11 NOTE — Assessment & Plan Note (Signed)
Pt c/o pain in the LLQ/L inguinal crease that began around 5-6 PM last night, sharp onset, no inciting event, she has never had this before, non radiating into her back or into her vaginal area or down her leg, described as sharp with movement, improved with heating pad and tylenol last night, that was originally a 10/10 and improved to 5/10 currently.  She denies any nausea, vomiting, diarrhea, blood in her stool, she is sexually active, unprotected sex with one female partner over the last year, denies any vaginal discharge or vaginal bleeding, she denies any fever, chills, sweats, weight loss, fatigue.  She has had previously TAH and cholecystectomy but not an appendectomy.  She denies any urinary complaints, and denies any lower back or CVA complaints.  She does still endorse occasional hot flashes and hormone related symptoms, and the location of this is concerning for possible ovarian cyst.  She has no red flags on exam, or history, her vital signs are normal, her UA does not show UTI, her abdominal exam is completely benign and all appendiceal testing was negative, she has not rebound/guarding/rigidity, and her L hip MSK exam was normal.  At this point, probably ovarian cyst rupture or L ovarian cyst irritation as improving with analgesia and rest.  Since conservative management is working well and her exam is benign, will continue with this course.  If continues, could consider bimanual exam and transvaginal US to evaluate for free fluid and could consider lab work.  Will have her continue with NSAID (Voltaren at home), and had previous colorectal screening in 2008 with one polyp removal and repeat scheduled for 2018 with no family history of colorectal CA.

## 2013-10-11 NOTE — Progress Notes (Signed)
Sonya Walton is a 52 y.o. female who presents today for left lower abdominal pain/inguinal pain.  Pt c/o pain in the LLQ/L inguinal crease that began around 5-6 PM last night, sharp onset, no inciting event, she has never had this before, non radiating into her back or into her vaginal area or down her leg, described as sharp with movement, improved with heating pad and tylenol last night, that was originally a 10/10 and improved to 5/10 currently.  She denies any nausea, vomiting, diarrhea, blood in her stool, she is sexually active, unprotected sex with one female partner over the last year, denies any vaginal discharge or vaginal bleeding, she denies any fever, chills, sweats, weight loss, fatigue.  She has had previously TAH and cholecystectomy but not an appendectomy.  She denies any urinary complaints, and denies any lower back or CVA complaints.  She does still endorse occasional hot flashes and hormone related symptoms.  Past Medical History  Diagnosis Date  . Bronchitis   . Hypertension   . Arthritis   . Left breast mass 2004    s/p biopsy, all normal mammograms until 12/2011    History  Smoking status  . Current Every Day Smoker -- 1.00 packs/day for 31 years  . Types: Cigarettes  Smokeless tobacco  . Never Used    Comment: has smoking cessation info from last admission    Family History  Problem Relation Age of Onset  . Hypertension Other   . Diabetes Other   . Asthma Other   . Liver cancer Father   . Cancer Father     lung and liver  . Diabetes type II Sister   . Diabetes type II Brother   . Heart disease Mother   . Cancer Paternal Aunt     lung  . Hyperlipidemia Mother   . Hypertension Mother   . Asthma Sister   . Diabetes Sister   . Hypertension Sister   . Early death Sister     premature twins   . Hypertension Brother   . Diabetes Brother   . Asthma Brother   . Cancer      lung  . Heart failure Mother   . Breast cancer Sister 16  . COPD Sister      Current Outpatient Prescriptions on File Prior to Visit  Medication Sig Dispense Refill  . atenolol-chlorthalidone (TENORETIC) 50-25 MG per tablet Take 1 tablet by mouth daily.  30 tablet  11  . Cholecalciferol (VITAMIN D3) 2000 UNITS TABS Take 2,000 Units by mouth every morning.      . diclofenac (VOLTAREN) 75 MG EC tablet TAKE ONE TABLET BY MOUTH TWICE DAILY  60 tablet  0  . Multiple Vitamin (MULITIVITAMIN WITH MINERALS) TABS Take 1 tablet by mouth daily.      . potassium chloride SA (K-DUR,KLOR-CON) 20 MEQ tablet Please take 1 tablet by mouth every two weeks  10 tablet  0  . [DISCONTINUED] pantoprazole (PROTONIX) 40 MG tablet Take 1 tablet (40 mg total) by mouth daily at 12 noon.  30 tablet  0   No current facility-administered medications on file prior to visit.    ROS: Per HPI.  All other systems reviewed and are negative.   Physical Exam Filed Vitals:   10/11/13 0941  BP: 125/82  Pulse: 70  Temp: 99 F (37.2 C)    Physical Examination: General appearance - alert, well appearing, and in no distress Chest - clear to auscultation, no wheezes, rales or rhonchi,  symmetric air entry Heart - normal rate and regular rhythm, no murmurs noted Abdomen - Soft, TTP with deep palpation LLQ/Inguinal Crease, NABS, no rebound/guarding/rigidity, negative rovsing, negative mcburney's, negative psoas, negative obturator, Non distended, no HSM Musculoskeletal - L hip nml, nml ROM, nml MS, no pain with movement

## 2013-10-11 NOTE — Patient Instructions (Signed)
Sonya Walton, it was nice seeing you today.  We believe you probably have a cyst that is contributing to your pain currently.  Please take your voltaren tablets two times per day and if your pain worsens or does not improve over the next 5-7 days, please give [place] a call back.    Thanks, Dr. Paulina Fusi and Dr. Remo Lipps

## 2013-10-22 ENCOUNTER — Ambulatory Visit: Payer: BC Managed Care – PPO | Admitting: Family Medicine

## 2013-12-10 ENCOUNTER — Ambulatory Visit: Payer: BC Managed Care – PPO | Admitting: Family Medicine

## 2013-12-28 ENCOUNTER — Encounter: Payer: Self-pay | Admitting: Family Medicine

## 2013-12-28 ENCOUNTER — Ambulatory Visit (INDEPENDENT_AMBULATORY_CARE_PROVIDER_SITE_OTHER): Payer: BC Managed Care – PPO | Admitting: Family Medicine

## 2013-12-28 VITALS — BP 119/79 | HR 81 | Temp 98.0°F | Ht 63.0 in | Wt 193.1 lb

## 2013-12-28 DIAGNOSIS — R079 Chest pain, unspecified: Secondary | ICD-10-CM

## 2013-12-28 DIAGNOSIS — I10 Essential (primary) hypertension: Secondary | ICD-10-CM

## 2013-12-28 NOTE — Assessment & Plan Note (Signed)
Well controlled, 119/79 today - continue atenolol/chlorthalidone - f/u in 3 months

## 2013-12-28 NOTE — Assessment & Plan Note (Signed)
Single episode chest/epigastric/LUQ pain, most consistent with GERD, had negative EKG and EMS eval at time of episode, resolved with sitting upright - patient will try behavoral modifications first, avoid insighting foods, especially in evening, remain upright for at least 2 hours after eating, tums prn, will return if this continues to be an issue for PPI and/or further CP eval if warranted

## 2013-12-28 NOTE — Progress Notes (Signed)
   Subjective:    Patient ID: Sonya Walton, female    DOB: [birth year], 53 y.o.   MRN: [id number]  Hypertension Associated symptoms include chest pain. Pertinent negatives include no headaches or shortness of breath.   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  Respiratory: Negative for shortness of breath.   Cardiovascular: Positive for chest pain. Negative for leg swelling.  Gastrointestinal: Positive for abdominal pain. Negative for nausea, vomiting and diarrhea.  Neurological: Negative for dizziness and headaches.  All other systems reviewed and are negative.       Objective:   Physical Exam  Nursing note and vitals reviewed. Constitutional: She is oriented to person, place, and time. She appears well-developed and well-nourished. No distress.  HENT:  Head: Normocephalic and atraumatic.  Eyes: Conjunctivae are normal. Right eye exhibits no discharge. Left eye exhibits no discharge. No scleral icterus.  Neck: Normal range of motion. Neck supple.  Cardiovascular: Normal rate, regular rhythm and normal heart sounds.   No murmur heard. Pulmonary/Chest: Effort normal and breath sounds normal. No respiratory distress. She has no wheezes.  Abdominal: Soft. She exhibits no distension and no mass. There is no tenderness. There is no rebound and no guarding.  Musculoskeletal: Normal range of motion. She exhibits no  edema and no tenderness.  Neurological: She is alert and oriented to person, place, and time.  Skin: Skin is warm and dry. She is not diaphoretic.  Psychiatric: She has a normal mood and affect. Her behavior is normal.          Assessment & Plan:

## 2013-12-28 NOTE — Patient Instructions (Signed)
Gastroesophageal Reflux Disease, Adult  Gastroesophageal reflux disease (GERD) happens when acid from your stomach flows up into the esophagus. When acid comes in contact with the esophagus, the acid causes soreness (inflammation) in the esophagus. Over time, GERD may create small holes (ulcers) in the lining of the esophagus.  CAUSES   · Increased body weight. This puts pressure on the stomach, making acid rise from the stomach into the esophagus.  · Smoking. This increases acid production in the stomach.  · Drinking alcohol. This causes decreased pressure in the lower esophageal sphincter (valve or ring of muscle between the esophagus and stomach), allowing acid from the stomach into the esophagus.  · Late evening meals and a full stomach. This increases pressure and acid production in the stomach.  · A malformed lower esophageal sphincter.  Sometimes, no cause is found.  SYMPTOMS   · Burning pain in the lower part of the mid-chest behind the breastbone and in the mid-stomach area. This may occur twice a week or more often.  · Trouble swallowing.  · Sore throat.  · Dry cough.  · Asthma-like symptoms including chest tightness, shortness of breath, or wheezing.  DIAGNOSIS   Your caregiver may be able to diagnose GERD based on your symptoms. In some cases, X-rays and other tests may be done to check for complications or to check the condition of your stomach and esophagus.  TREATMENT   Your caregiver may recommend over-the-counter or prescription medicines to help decrease acid production. Ask your caregiver before starting or adding any new medicines.   HOME CARE INSTRUCTIONS   · Change the factors that you can control. Ask your caregiver for guidance concerning weight loss, quitting smoking, and alcohol consumption.  · Avoid foods and drinks that make your symptoms worse, such as:  · Caffeine or alcoholic drinks.  · Chocolate.  · Peppermint or mint flavorings.  · Garlic and onions.  · Spicy foods.  · Citrus fruits,  such as oranges, lemons, or limes.  · Tomato-based foods such as sauce, chili, salsa, and pizza.  · Fried and fatty foods.  · Avoid lying down for the 3 hours prior to your bedtime or prior to taking a nap.  · Eat small, frequent meals instead of large meals.  · Wear loose-fitting clothing. Do not wear anything tight around your waist that causes pressure on your stomach.  · Raise the head of your bed 6 to 8 inches with wood blocks to help you sleep. Extra pillows will not help.  · Only take over-the-counter or prescription medicines for pain, discomfort, or fever as directed by your caregiver.  · Do not take aspirin, ibuprofen, or other nonsteroidal anti-inflammatory drugs (NSAIDs).  SEEK IMMEDIATE MEDICAL CARE IF:   · You have pain in your arms, neck, jaw, teeth, or back.  · Your pain increases or changes in intensity or duration.  · You develop nausea, vomiting, or sweating (diaphoresis).  · You develop shortness of breath, or you faint.  · Your vomit is green, yellow, black, or looks like coffee grounds or blood.  · Your stool is red, bloody, or black.  These symptoms could be signs of other problems, such as heart disease, gastric bleeding, or esophageal bleeding.  MAKE SURE YOU:   · Understand these instructions.  · Will watch your condition.  · Will get help right away if you are not doing well or get worse.  Document Released: 07/28/2005 Document Revised: 01/10/2012 Document Reviewed: 05/07/2011  [employer]® Patient   Information ©2014 [employer].

## 2014-01-09 ENCOUNTER — Other Ambulatory Visit: Payer: Self-pay | Admitting: Family Medicine

## 2014-01-09 DIAGNOSIS — Z1231 Encounter for screening mammogram for malignant neoplasm of breast: Secondary | ICD-10-CM

## 2014-02-04 ENCOUNTER — Ambulatory Visit (HOSPITAL_COMMUNITY)
Admission: RE | Admit: 2014-02-04 | Discharge: 2014-02-04 | Disposition: A | Discharge status: Home / Self Care | Payer: BC Managed Care – PPO | Source: Ambulatory Visit | Attending: Family Medicine | Admitting: Family Medicine

## 2014-02-04 DIAGNOSIS — Z1231 Encounter for screening mammogram for malignant neoplasm of breast: Secondary | ICD-10-CM | POA: Insufficient documentation

## 2014-02-21 ENCOUNTER — Other Ambulatory Visit: Payer: Self-pay | Admitting: *Deleted

## 2014-02-21 MED ORDER — DICLOFENAC SODIUM 75 MG PO TBEC: DELAYED_RELEASE_TABLET | ORAL | Status: DC

## 2014-03-28 ENCOUNTER — Other Ambulatory Visit: Payer: Self-pay | Admitting: Family Medicine

## 2014-04-25 ENCOUNTER — Telehealth: Payer: Self-pay | Admitting: Family Medicine

## 2014-04-25 NOTE — Telephone Encounter (Signed)
Patient requesting refill for the atenolol and diclofenac.  Call patient if need to come in before refills given.

## 2014-04-26 MED ORDER — ATENOLOL-CHLORTHALIDONE 50-25 MG PO TABS: ORAL_TABLET | ORAL | Status: DC

## 2014-04-26 MED ORDER — DICLOFENAC SODIUM 75 MG PO TBEC: DELAYED_RELEASE_TABLET | ORAL | Status: DC

## 2014-04-26 NOTE — Telephone Encounter (Signed)
Refills sent to pharmacy. Please inform patient.

## 2014-04-26 NOTE — Telephone Encounter (Signed)
Left message on voicemail informing that rx's have been sent in.

## 2014-09-02 ENCOUNTER — Encounter: Payer: Self-pay | Admitting: Family Medicine

## 2014-12-25 ENCOUNTER — Ambulatory Visit (HOSPITAL_COMMUNITY)
Admission: RE | Admit: 2014-12-25 | Discharge: 2014-12-25 | Disposition: A | Discharge status: Home / Self Care | Payer: BLUE CROSS/BLUE SHIELD | Source: Ambulatory Visit | Attending: Family Medicine | Admitting: Family Medicine

## 2014-12-25 ENCOUNTER — Ambulatory Visit (INDEPENDENT_AMBULATORY_CARE_PROVIDER_SITE_OTHER): Payer: BLUE CROSS/BLUE SHIELD | Admitting: Family Medicine

## 2014-12-25 ENCOUNTER — Encounter: Payer: Self-pay | Admitting: Family Medicine

## 2014-12-25 VITALS — BP 125/81 | HR 76 | Temp 98.8°F | Ht 63.0 in | Wt 194.0 lb

## 2014-12-25 DIAGNOSIS — Z720 Tobacco use: Secondary | ICD-10-CM

## 2014-12-25 DIAGNOSIS — I10 Essential (primary) hypertension: Secondary | ICD-10-CM

## 2014-12-25 DIAGNOSIS — Z113 Encounter for screening for infections with a predominantly sexual mode of transmission: Secondary | ICD-10-CM

## 2014-12-25 DIAGNOSIS — R079 Chest pain, unspecified: Secondary | ICD-10-CM | POA: Diagnosis not present

## 2014-12-25 DIAGNOSIS — M1990 Unspecified osteoarthritis, unspecified site: Secondary | ICD-10-CM

## 2014-12-25 LAB — BASIC METABOLIC PANEL
BUN: 15 mg/dL (ref 6–23)
CO2: 28 mEq/L (ref 19–32)
Calcium: 9.2 mg/dL (ref 8.4–10.5)
Chloride: 102 mEq/L (ref 96–112)
Creat: 0.76 mg/dL (ref 0.50–1.10)
Glucose, Bld: 89 mg/dL (ref 70–99)
Potassium: 3.4 mEq/L — ABNORMAL LOW (ref 3.5–5.3)
Sodium: 139 mEq/L (ref 135–145)

## 2014-12-25 LAB — LIPID PANEL
Cholesterol: 149 mg/dL (ref 0–200)
HDL: 38 mg/dL — ABNORMAL LOW (ref >=46)
LDL Cholesterol: 87 mg/dL (ref 0–99)
Total CHOL/HDL Ratio: 3.9 Ratio
Triglycerides: 120 mg/dL (ref <150)
VLDL: 24 mg/dL (ref 0–40)

## 2014-12-25 MED ORDER — DICLOFENAC SODIUM 75 MG PO TBEC: DELAYED_RELEASE_TABLET | ORAL | Status: DC

## 2014-12-25 MED ORDER — NICOTINE 21 MG/24HR TD PT24: 21.0000 mg | MEDICATED_PATCH | Freq: Every day | TRANSDERMAL | Status: DC

## 2014-12-25 NOTE — Assessment & Plan Note (Signed)
Well controlled, 125/81 - continue atenolol/chlorthalidone - obtain bmet and lipid panel today

## 2014-12-25 NOTE — Assessment & Plan Note (Addendum)
Intermittent with inspiration. Pinching/sharp, likely costochondritis - voltaren - ekg unchanged from prior

## 2014-12-25 NOTE — Assessment & Plan Note (Signed)
Wants to quit, asking about patches - refer to quitline, rx for patches - f/u in 6 weeks (will plan to step down to 14mg  at that time)

## 2014-12-25 NOTE — Patient Instructions (Signed)
I think that your chest and back pain is being caused by inflamation of your rib cage. I have refilled your voltaren which you can also use for this pain since it is anti-inflamatory.   I am very excited to hear that you want to quit smoking. This is the most important thing you can do to improve your health at this point. I sent a prescription for nicotine patches to your drug store. Please follow-up with me in 6 weeks to see how things are going and   Smoking Cessation, Tips for Success If you are ready to quit smoking, congratulations! You have chosen to help yourself be healthier. Cigarettes bring nicotine, tar, carbon monoxide, and other irritants into your body. Your lungs, heart, and blood vessels will be able to work better without these poisons. There are many different ways to quit smoking. Nicotine gum, nicotine patches, a nicotine inhaler, or nicotine nasal spray can help with physical craving. Hypnosis, support groups, and medicines help break the habit of smoking. WHAT THINGS CAN I DO TO MAKE QUITTING EASIER?  Here are some tips to help you quit for good:  Pick a date when you will quit smoking completely. Tell all of your friends and family about your plan to quit on that date.  Do not try to slowly cut down on the number of cigarettes you are smoking. Pick a quit date and quit smoking completely starting on that day.  Throw away all cigarettes.   Clean and remove all ashtrays from your home, work, and car.  On a card, write down your reasons for quitting. Carry the card with you and read it when you get the urge to smoke.  Cleanse your body of nicotine. Drink enough water and fluids to keep your urine clear or pale yellow. Do this after quitting to flush the nicotine from your body.  Learn to predict your moods. Do not let a bad situation be your excuse to have a cigarette. Some situations in your life might tempt you into wanting a cigarette.  Never have "just one" cigarette.  It leads to wanting another and another. Remind yourself of your decision to quit.  Change habits associated with smoking. If you smoked while driving or when feeling stressed, try other activities to replace smoking. Stand up when drinking your coffee. Brush your teeth after eating. Sit in a different chair when you read the paper. Avoid alcohol while trying to quit, and try to drink fewer caffeinated beverages. Alcohol and caffeine may urge you to smoke.  Avoid foods and drinks that can trigger a desire to smoke, such as sugary or spicy foods and alcohol.  Ask people who smoke not to smoke around you.  Have something planned to do right after eating or having a cup of coffee. For example, plan to take a walk or exercise.  Try a relaxation exercise to calm you down and decrease your stress. Remember, you may be tense and nervous for the first 2 weeks after you quit, but this will pass.  Find new activities to keep your hands busy. Play with a pen, coin, or rubber band. Doodle or draw things on paper.  Brush your teeth right after eating. This will help cut down on the craving for the taste of tobacco after meals. You can also try mouthwash.   Use oral substitutes in place of cigarettes. Try using lemon drops, carrots, cinnamon sticks, or chewing gum. Keep them handy so they are available when you have  the urge to smoke.  When you have the urge to smoke, try deep breathing.  Designate your home as a nonsmoking area.  If you are a heavy smoker, ask your health care provider about a prescription for nicotine chewing gum. It can ease your withdrawal from nicotine.  Reward yourself. Set aside the cigarette money you save and buy yourself something nice.  Look for support from others. Join a support group or smoking cessation program. Ask someone at home or at work to help you with your plan to quit smoking.  Always ask yourself, "Do I need this cigarette or is this just a reflex?" Tell  yourself, "Today, I choose not to smoke," or "I do not want to smoke." You are reminding yourself of your decision to quit.  Do not replace cigarette smoking with electronic cigarettes (commonly called e-cigarettes). The safety of e-cigarettes is unknown, and some may contain harmful chemicals.  If you relapse, do not give up! Plan ahead and think about what you will do the next time you get the urge to smoke. HOW WILL I FEEL WHEN I QUIT SMOKING? You may have symptoms of withdrawal because your body is used to nicotine (the addictive substance in cigarettes). You may crave cigarettes, be irritable, feel very hungry, cough often, get headaches, or have difficulty concentrating. The withdrawal symptoms are only temporary. They are strongest when you first quit but will go away within 10-14 days. When withdrawal symptoms occur, stay in control. Think about your reasons for quitting. Remind yourself that these are signs that your body is healing and getting used to being without cigarettes. Remember that withdrawal symptoms are easier to treat than the major diseases that smoking can cause.  Even after the withdrawal is over, expect periodic urges to smoke. However, these cravings are generally short lived and will go away whether you smoke or not. Do not smoke! WHAT RESOURCES ARE AVAILABLE TO HELP ME QUIT SMOKING? Your health care provider can direct you to community resources or hospitals for support, which may include:  Group support.  Education.  Hypnosis.  Therapy. Document Released: 07/16/2004 Document Revised: 03/04/2014 Document Reviewed: 04/05/2013 [facility] Patient Information 2015 [place], [place]. This information is not intended to replace advice given to you by your health care provider. Make sure you discuss any questions you have with your health care provider.

## 2014-12-25 NOTE — Progress Notes (Signed)
   Subjective:    Patient ID: Sonya Walton, female    DOB: 06-08-1961, 54 y.o.   MRN: [id number]  HPI Pt presents for chest pain. Reports pain is sharp and left sided. Brought on by inspiration. Not exertion. Some associated SOB. Negative stress test in 2013. No cough or fever. 1 ppd smoker without known copd.   Review of Systems See HPI    Objective:   Physical Exam  Constitutional: She is oriented to person, place, and time. She appears well-developed and well-nourished. No distress.  HENT:  Head: Normocephalic and atraumatic.  Eyes: Conjunctivae are normal. Right eye exhibits no discharge. Left eye exhibits no discharge. No scleral icterus.  Cardiovascular: Normal rate, regular rhythm and normal heart sounds.   No murmur heard. Pulmonary/Chest: Effort normal. No respiratory distress. She has no wheezes.  Prolonged expiratory phase  Abdominal: She exhibits no distension.  Musculoskeletal: She exhibits no edema.  Neurological: She is alert and oriented to person, place, and time.  Skin: Skin is warm and dry. She is not diaphoretic.  Psychiatric: She has a normal mood and affect. Her behavior is normal.  Nursing note and vitals reviewed.         Assessment & Plan:

## 2014-12-26 ENCOUNTER — Telehealth: Payer: Self-pay | Admitting: *Deleted

## 2014-12-26 LAB — HIV ANTIBODY (ROUTINE TESTING W REFLEX): HIV 1&2 Ab, 4th Generation: NONREACTIVE

## 2014-12-26 NOTE — Telephone Encounter (Signed)
-----   Message from Frazier Richards, MD sent at 12/26/2014 10:03 AM EST ----- Please let patient know that all of her lab results were normal

## 2014-12-26 NOTE — Telephone Encounter (Signed)
Pt informed. Terrance Lanahan, CMA  

## 2015-02-18 ENCOUNTER — Other Ambulatory Visit: Payer: Self-pay | Admitting: Family Medicine

## 2015-02-18 DIAGNOSIS — Z1231 Encounter for screening mammogram for malignant neoplasm of breast: Secondary | ICD-10-CM

## 2015-02-19 ENCOUNTER — Ambulatory Visit: Payer: BLUE CROSS/BLUE SHIELD | Admitting: Family Medicine

## 2015-03-03 ENCOUNTER — Ambulatory Visit (HOSPITAL_COMMUNITY): Payer: BLUE CROSS/BLUE SHIELD

## 2015-03-03 ENCOUNTER — Ambulatory Visit: Payer: BLUE CROSS/BLUE SHIELD | Admitting: Family Medicine

## 2015-03-24 ENCOUNTER — Other Ambulatory Visit: Payer: Self-pay | Admitting: Family Medicine

## 2015-03-24 ENCOUNTER — Ambulatory Visit (HOSPITAL_COMMUNITY)
Admission: RE | Admit: 2015-03-24 | Discharge: 2015-03-24 | Disposition: A | Discharge status: Home / Self Care | Payer: BLUE CROSS/BLUE SHIELD | Source: Ambulatory Visit | Attending: Family Medicine | Admitting: Family Medicine

## 2015-03-24 DIAGNOSIS — Z1231 Encounter for screening mammogram for malignant neoplasm of breast: Secondary | ICD-10-CM

## 2015-03-24 DIAGNOSIS — R928 Other abnormal and inconclusive findings on diagnostic imaging of breast: Secondary | ICD-10-CM

## 2015-03-27 ENCOUNTER — Other Ambulatory Visit: Payer: Self-pay | Admitting: Family Medicine

## 2015-03-27 ENCOUNTER — Other Ambulatory Visit: Payer: Self-pay

## 2015-03-27 DIAGNOSIS — R928 Other abnormal and inconclusive findings on diagnostic imaging of breast: Secondary | ICD-10-CM

## 2015-03-28 ENCOUNTER — Ambulatory Visit
Admission: RE | Admit: 2015-03-28 | Discharge: 2015-03-28 | Disposition: A | Discharge status: Home / Self Care | Payer: BLUE CROSS/BLUE SHIELD | Source: Ambulatory Visit | Attending: Family Medicine | Admitting: Family Medicine

## 2015-03-28 ENCOUNTER — Encounter (INDEPENDENT_AMBULATORY_CARE_PROVIDER_SITE_OTHER): Payer: Self-pay

## 2015-03-28 DIAGNOSIS — R928 Other abnormal and inconclusive findings on diagnostic imaging of breast: Secondary | ICD-10-CM

## 2015-04-04 ENCOUNTER — Encounter: Payer: Self-pay | Admitting: Family Medicine

## 2015-04-04 ENCOUNTER — Ambulatory Visit (INDEPENDENT_AMBULATORY_CARE_PROVIDER_SITE_OTHER): Payer: BLUE CROSS/BLUE SHIELD | Admitting: Family Medicine

## 2015-04-04 VITALS — BP 131/76 | HR 69 | Temp 98.1°F | Wt 204.0 lb

## 2015-04-04 DIAGNOSIS — M545 Low back pain, unspecified: Secondary | ICD-10-CM

## 2015-04-04 MED ORDER — CYCLOBENZAPRINE HCL 10 MG PO TABS: 10.0000 mg | ORAL_TABLET | Freq: Three times a day (TID) | ORAL | Status: DC | PRN

## 2015-04-04 NOTE — Patient Instructions (Signed)
Back Pain Treatment - you should: use heat on the area, take your diclofenac as prescribed, use the flexeril You should be better in: in 1-2 weeks Call us or go to the ER if you lose control of your bowels or bladder or are weak in one leg, develop fever, or numbness Call us if your pain gets worse Come back to see [place] in 1-2 weeks if not improving.

## 2015-04-04 NOTE — Progress Notes (Signed)
Patient ID: [name], female   DOB: [birth year], 54 y.o.   MRN: [id number]  Tommi Rumps, MD Phone: [phone]  ABBIGAILE ROCKMAN is a 54 y.o. female who presents today for same day appointment.  Lumbar back pain: onset 2 days ago. Notes onset when she got up out of bed. Radiation to hip, though not down legs. Is worse with moving and bending. Feels like a spasm and deep ache. Has had 2-3 flares of back pain in the past 3 years. Advil has not helped. Denies fever, incontinence, saddle anesthesia, weakness, numbness, and history of cancer.  PMH: smoker.   ROS: Per HPI   Physical Exam Filed Vitals:   04/04/15 1340  BP: 131/76  Pulse: 69  Temp: 98.1 F (36.7 C)    Gen: Well NAD HEENT: PERRL,  MMM Lungs: CTABL Nl WOB Heart: RRR, no murmur appreciated  MSK: no midline spine tenderness, no muscular tenderness, has discomfort on rotation to the left, no issues flexing or extending spine, no swelling in back, negative straight leg raise bilaterally  Neuro: 5/5 strength in bilateral quads, hamstrings, plantar and dorsiflexion, sensation to light touch intact in bilateral LE, normal gait, 2+ patellar reflexes Exts: Non edematous BL  LE, warm and well perfused.    Assessment/Plan: Please see individual problem list.  Tommi Rumps, MD [place] PGY-3

## 2015-04-06 DIAGNOSIS — M545 Low back pain, unspecified: Secondary | ICD-10-CM | POA: Insufficient documentation

## 2015-04-06 NOTE — Assessment & Plan Note (Signed)
Patient with likely muscular strain vs SI joint dysfunction. No red flags. Neurologically intact. Will treat with patients home diclofenac and add flexeril. Advised on activity as tolerated. Given return precautions.

## 2015-04-07 ENCOUNTER — Other Ambulatory Visit: Payer: Self-pay | Admitting: *Deleted

## 2015-04-07 DIAGNOSIS — I10 Essential (primary) hypertension: Secondary | ICD-10-CM

## 2015-04-08 MED ORDER — ATENOLOL-CHLORTHALIDONE 50-25 MG PO TABS: ORAL_TABLET | ORAL | Status: DC

## 2015-04-21 ENCOUNTER — Other Ambulatory Visit: Payer: Self-pay | Admitting: *Deleted

## 2015-04-21 DIAGNOSIS — I10 Essential (primary) hypertension: Secondary | ICD-10-CM

## 2015-04-21 MED ORDER — ATENOLOL-CHLORTHALIDONE 50-25 MG PO TABS: ORAL_TABLET | ORAL | Status: DC

## 2015-05-02 ENCOUNTER — Ambulatory Visit (INDEPENDENT_AMBULATORY_CARE_PROVIDER_SITE_OTHER): Payer: BLUE CROSS/BLUE SHIELD | Admitting: Family Medicine

## 2015-05-02 ENCOUNTER — Ambulatory Visit: Payer: BLUE CROSS/BLUE SHIELD | Admitting: Family Medicine

## 2015-05-02 ENCOUNTER — Encounter: Payer: Self-pay | Admitting: Family Medicine

## 2015-05-02 VITALS — BP 111/56 | HR 81 | Temp 98.2°F | Ht 63.0 in | Wt 204.7 lb

## 2015-05-02 DIAGNOSIS — M75111 Incomplete rotator cuff tear or rupture of right shoulder, not specified as traumatic: Secondary | ICD-10-CM | POA: Insufficient documentation

## 2015-05-02 DIAGNOSIS — M25511 Pain in right shoulder: Secondary | ICD-10-CM

## 2015-05-02 DIAGNOSIS — Z720 Tobacco use: Secondary | ICD-10-CM | POA: Diagnosis not present

## 2015-05-02 NOTE — Assessment & Plan Note (Signed)
Quit for past 4 months. Remains smoke free. Off NRT - Congratulated and provided continued smoking cessation advice

## 2015-05-02 NOTE — Assessment & Plan Note (Signed)
Chronic Right shoulder/upper arm pain worsening over past 2-4 months. 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 Consider biceps tendonitis based on location of pain in biceps groove, otherwise rotator cuff and impingement testing negative, full active ROM, reassuring exam. Unlikely rotator cuff / labral tear, but consider in differential (with +nighttime worsening pain, awakenings), may have some bursitis but less likely active problem, exam not suggestive of C-spine abnormality, h/o mild C-spine DJD could be contributing.  Plan: 1. Take Diclofenac '75mg'$  PO BID scheduled x 2 weeks, then PRN 2. Use heat, ice PRN 3. Relative rest at work - declined note for off work. Strongly advised reduce repetitive activities, vacuuming etc. Goal to see if trial rest improves symptoms. 4. Discussed possible X-ray imaging, steroid injection - declined at this time. Want to try conservative therapy first. 5. RTC 1 month for re-evaluation. If worsening, consider X-ray (eval arthritis), and injection (if resolves pain, likely bursitis, otherwise likely biceps vs rotator cuff tendinopathy). Consider referral to Ortho vs SM in future

## 2015-05-02 NOTE — Progress Notes (Signed)
   Subjective:    Patient ID: Sonya Walton, female    DOB: July 04, 1961, 54 y.o.   MRN: [id number]  HPI  RIGHT ARM/SHOULDER PAIN: - Follow-up today for reported symptoms started about 4 months ago, gradual worsening and then significant worsening over last 2 months. No prior surgeries, injections. Denies any sudden injury or trauma, fall. Similar symptoms but not nearly as severe on Left shoulder (only once every few weeks). 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 Significant worsening at night-time, difficulty laying on Right shoulder, often wakes up with pain. - Known history bilateral shoulder OA. No prior rotator cuff injury. No recent X-rays. - Taking Diclofenac '75mg'$  PO BID PRN (usually taking 1x daily in morning), Flexeril '10mg'$  PO TID PRN (didn't take night often, not sure if relief). Has not tried heating pad or ice for shoulder. - Admits Right hand numbness, tingling in AM, quickly resolves - Denies any fever/chills, weakness, numbness, joint swelling other joint pain, rash  TOBACCO ABUSE: - Remained smoke free for past 4 months, last nicotine patch 02/02/15. Feels a lot better. - No longer needing NRT.  I have reviewed and updated the following as appropriate: allergies and current medications  Social Hx: - Works at [employer] living  Review of Systems  See above HPI    Objective:   Physical Exam  BP 111/56 mmHg  Pulse 81  Temp(Src) 98.2 F (36.8 C) (Oral)  Ht '5\' 3"'$  (1.6 m)  Wt 204 lb 11.2 oz (92.851 kg)  BMI 36.27 kg/m2  Gen - well-appearing, NAD Neck - supple, non-tender paraspinal c-spine, negative Spurling's without radiculopathy MSK - Right Shoulder: no deformity compared to  left, full active ROM forward flexion above head to ceiling, abduction, int/ext rotation without pain. Supraspinatus / rotator cuff muscle str testing 5/5 b/l full can and 5/5 empty can without pain, Hawkin's test with negative impingement. Speed's biceps test negative for pain but some palpable pain over biceps groove anterior shoulder. Ext - Otherwise non-tender, no edema, peripheral pulses intact +2 b/l Skin - warm, dry, no rashes Neuro - intact distal sensation to light touch     Assessment & Plan:   See specific A&P problem list for details.

## 2015-05-02 NOTE — Patient Instructions (Signed)
Dear Sonya Walton, Thank you for coming in to clinic today. It was good to see you!  1. Your Right Shoulder Pain seems consistent with an overuse injury, worsened by arthritis and likely some bursitis as well. You have good muscle strength and Range of Motion, all of this is reassuring and is less likely to have a tear in your rotator cuff or fracture in bone. This seems like a chronic injury, and needs some rest and reduce the repetitive strain on your Right shoulder / arm. 2. Try to reduce amount of repetitive activity you do, reduce vacuuming and straining your arm. 3. Take Diclofenac TWO times daily with food every day for 2 weeks, then only as needed. 4. May add Tylenol Extra Str as well '500mg'$  take 1-2 tablets every 6 hours as needed for pain. Also try heating pad / ice on your shoulder. 5. May try Flexeril at night for sleep.  Some important numbers from today's visit:  BP 111/56 mmHg  Pulse 81  Temp(Src) 98.2 F (36.8 C) (Oral)  Ht '5\' 3"'$  (1.6 m)  Wt 204 lb 11.2 oz (92.851 kg)  BMI 36.27 kg/m2  Your BP is normal. [facility] has refills for 1 year.  Results -  Please schedule a follow-up appointment with Dr. Sherril Cong in 1 month to follow-up Right Shoulder Pain - can consider X-ray and Injection at that time. Otherwise, may need referral to Sports Med / Ortho.  If you have any other questions or concerns, please feel free to call the clinic to contact me. You may also schedule an earlier appointment if necessary.  However, if your symptoms get significantly worse, please go to the Emergency Department to seek immediate medical attention.  [name], [place]

## 2015-05-23 ENCOUNTER — Ambulatory Visit (INDEPENDENT_AMBULATORY_CARE_PROVIDER_SITE_OTHER): Payer: BLUE CROSS/BLUE SHIELD | Admitting: Family Medicine

## 2015-05-23 ENCOUNTER — Encounter: Payer: Self-pay | Admitting: Family Medicine

## 2015-05-23 VITALS — BP 135/77 | HR 66 | Temp 98.0°F | Ht 63.0 in | Wt 207.0 lb

## 2015-05-23 DIAGNOSIS — M25511 Pain in right shoulder: Secondary | ICD-10-CM

## 2015-05-23 NOTE — Assessment & Plan Note (Addendum)
Persistent chronic R-shoulder pain >5 months, persistent without significant worsening. 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 Again, unlikely rotator cuff or labral tear given intact muscle str and mostly preserved ROM, worse with overhead. Consider biceps tendinitis based on location of palpation pain anteriorly over biceps groove.  Plan: 1. Trial R-shoulder subacromial steroid injection today - diagnostic and to see if therapeutic, if significant improvement more likely bursitis and less likely tendinopathy. Alternatively, if not improved would warrant further imaging with [place] vs MRI in future. 2. Ordered R-shoulder X-ray, eval for underlying OA 3. Referral to Sports Medicine for follow-up eval, after injection and X-ray 4. Continue Diclofenac '75mg'$  PO BID PRN, try add Tylenol PRN 5. Heating pad, especially on R-trapezius muscle, may continue Flexeril PRN 6. RTC 1 month if not followed up by [facility] sooner.

## 2015-05-23 NOTE — Patient Instructions (Signed)
Dear Sonya Walton, Thank you for coming in to clinic today. It was good to see you!  1. Your Right Shoulder Pain seems consistent with an overuse injury, worsened by arthritis and likely some bursitis as well. You have good muscle strength and Range of Motion, all of this is reassuring and is less likely to have a tear in your rotator cuff or fracture in bone. This seems like a chronic injury, and needs some rest and reduce the repetitive strain on your Right shoulder / arm. - Injection done today with steroid and lidocaine in Right shoulder joint. This may cause your joint to ache more today, numbing medicine wears off within few hours, and steroid takes at least 1 day to work. If successful this will help reduce pain in shoulder for next several weeks to months. Everyone reacts differently. Try to avoid overuse if does feel better to avoid re-injury. - Shoulder X-ray ordered - [facility] to [facility], Radiology Department at any time, Monday is fine  2. Try to reduce amount of repetitive activity you do, reduce vacuuming and straining your arm. 3. Take Diclofenac TWO times daily with food every day for 2 weeks, then only as needed. 4. May add Tylenol Extra Str as well '500mg'$  take 1-2 tablets every 6 hours as needed for pain. Also try heating pad / ice on your shoulder. 5. Continue Flexeril  Some important numbers from today's visit:  BP 135/77 mmHg  Pulse 66  Temp(Src) 98 F (36.7 C) (Oral)  Ht '5\' 3"'$  (1.6 m)  Wt 207 lb (93.895 kg)  BMI 36.68 kg/m2  Referral made to [facility] here at [facility]. You should get an appointment within next few weeks.  You may call our office to check on the status.  If needed you can follow-up with [place] here again within 1 month.  If you have any other questions or concerns, please feel free to call the clinic to contact me. You may also schedule an earlier appointment if necessary.  However, if your symptoms get significantly worse, please go to  the Emergency Department to seek immediate medical attention.  [name], [place]

## 2015-05-23 NOTE — Progress Notes (Signed)
   Subjective:    Patient ID: Sonya Walton, female    DOB: 1961-04-01, 54 y.o.   MRN: [id number]  HPI  RIGHT ARM/SHOULDER PAIN: - Last visit 05/02/15 for same complaint, right shoulder pain, >4 months R-shoulder pain worsening. No known injury or trauma. No prior surgeries, injections. Similar pain on Left (since resolved). Known history of OA. No prior rotator cuff injury. - Reports she has been taking the Diclofenac '75mg'$  BID scheduled as advised with mild relief, tried Flexeril '10mg'$  few doses with mild relief, tried heating pad few times week with relief. - Continued to work, seems that rest helped significantly but when returned to work she tried to do more with left arm but then unable to complete job so switched back to right hand - Currently reports daily constant nagging pain persistent at 2-3/10, worse with overhead activities and wiping mirrors also with repetitive vacuuming. Pain worse at night, does sleep on Right side with worsening pain, wakes her up - Denies any fever/chills, weakness, numbness, joint swelling other joint pain, rash  I have reviewed and updated the following as appropriate: allergies and current medications  Social Hx: - Works at [employer], doing cleaning work, which involves vacuuming and cleaning surfaces, admits to worsening her R-shoulder pain with frequent use  Review of Systems  See above HPI    Objective:   Physical Exam  BP 135/77 mmHg  Pulse 66  Temp(Src) 98 F (36.7 C) (Oral)  Ht '5\' 3"'$  (1.6 m)  Wt 207 lb (93.895 kg)  BMI 36.68 kg/m2  Gen - well-appearing, comfortable, NAD Neck - supple, non-tender MSK: - Right Shoulder: no deformity compared to left, full active ROM forward flexion above head to ceiling, abduction, limited internal rotation with pain. Stable unchanged Supraspinatus / rotator cuff muscle str testing 5/5 b/l full can and 5/5 empty can without pain, Hawkin's test with mild-moderate pain positive for  impingement. Speed's biceps test negative for pain but some palpable pain over biceps groove anterior shoulder. Ext - Otherwise non-tender, no edema, peripheral pulses intact +2 b/l Skin - warm, dry, no rashes Neuro - intact distal sensation to light touch  Procedure: Right Subacromial Bursa (shoulder) injection Verbal consent obtained, written consent signed and scanned into record. Medication:  1 cc Depo-medrol '40mg'$  and 4 cc Lidocaine 1% without epi, total 5cc volume Time Out taken  Landmarks identified. Area cleansed with alcohol wipes, cold spray applied prior to injection.Using 21 gauge 1/2 length needle, Right subacromial bursa space was injected (with above listed medication).Sterile bandage placed.Patient tolerated procedure well without bleeding or paresthesias.No complications.     Assessment & Plan:   See specific A&P problem list for details.

## 2015-05-26 ENCOUNTER — Ambulatory Visit (HOSPITAL_COMMUNITY)
Admission: RE | Admit: 2015-05-26 | Discharge: 2015-05-26 | Disposition: A | Discharge status: Home / Self Care | Payer: BLUE CROSS/BLUE SHIELD | Source: Ambulatory Visit | Attending: Family Medicine | Admitting: Family Medicine

## 2015-05-26 DIAGNOSIS — M25511 Pain in right shoulder: Secondary | ICD-10-CM | POA: Diagnosis present

## 2015-05-27 ENCOUNTER — Telehealth: Payer: Self-pay | Admitting: Family Medicine

## 2015-05-27 NOTE — Telephone Encounter (Signed)
Pt is returning the doctors call about her results. jw

## 2015-05-27 NOTE — Telephone Encounter (Addendum)
Reviewed chart, last OV 05/23/15 for follow-up R-shoulder pain, at that visit performed R-subacromial steroid injection and also ordered R-shoulder complete series X-rays.  05/26/15 Right Shoulder X-ray FINDINGS: Mild spurring in the right AC joint and glenohumeral joint. No acute bony abnormality. Specifically, no fracture, subluxation, or dislocation. Soft tissues are intact.  IMPRESSION: Mild degenerative changes. No acute bony abnormality.  Unable to reach patient. LVM with above X-ray results, confirming previously presumed arthritis in R-shoulder joint. Would like to find out how patient is feeling with respect to pain after R-subacromial shoulder injection to see if improved or not. Otherwise, I also advised her that she has an upcoming Sports Medicine new patient apt with Dr. Nori Riis on Mon 06/09/15 at 2:00pm. Advised her to call [facility] back with further questions regarding these results, or to update me on her status after injection.  [name], [place], PGY-3

## 2015-05-27 NOTE — Telephone Encounter (Signed)
Disregard below message. I was able to call Mrs. Sonya Walton and discuss her results. All questions answered. She does not need to be called back regarding x-ray results. She is aware of next follow-up with Dr. Nori Riis at [facility] on 8/8. Additionally her pain is "much better" in her Right shoulder following the injection. Advised to continue current treatment plan, provided reassurance.  [name], [place], PGY-3

## 2015-05-27 NOTE — Telephone Encounter (Signed)
See telephone note from earlier today 7/26 for details.  Could you please call patient back, inform her of X-ray results, and ask about how her pain is doing since the injection? Also, update her on the scheduled Sports Medicine apt time, 06/09/15 with Dr. Nori Riis at 2:00pm (arrive 20 min early).  Thanks, I will try to call her again after work today if she still has questions after the update. I did try to leave most of her info in the voice mail I left her.  [name], [place], PGY-3

## 2015-06-09 ENCOUNTER — Encounter (INDEPENDENT_AMBULATORY_CARE_PROVIDER_SITE_OTHER): Payer: Self-pay

## 2015-06-09 ENCOUNTER — Encounter: Payer: Self-pay | Admitting: Family Medicine

## 2015-06-09 ENCOUNTER — Ambulatory Visit (INDEPENDENT_AMBULATORY_CARE_PROVIDER_SITE_OTHER): Payer: BLUE CROSS/BLUE SHIELD | Admitting: Family Medicine

## 2015-06-09 VITALS — BP 130/75 | Ht 62.0 in | Wt 200.0 lb

## 2015-06-09 DIAGNOSIS — M25511 Pain in right shoulder: Secondary | ICD-10-CM | POA: Diagnosis not present

## 2015-06-09 MED ORDER — NITROGLYCERIN 0.2 MG/HR TD PT24: MEDICATED_PATCH | TRANSDERMAL | Status: DC

## 2015-06-09 NOTE — Assessment & Plan Note (Signed)
Symptoms most likely 2/2 to Rotator cuff pathology and OA.  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  - will try nitro patch  - given home modalities. She will work with internal and external rotation for two weeks. She will then graduate to supraspinatus training if she noticed improvement in her pain. Given thera band to assist with rehab  - advised to try performing work related tasks with left arm (right arm dominant).  - she will f/u in 6 weeks. If still symptomatic may consider repeat injection or formal PT

## 2015-06-09 NOTE — Progress Notes (Signed)
  [name] - 54 y.o. female MRN [id number]  Date of birth: 01-25-1961   Sonya Walton is a 54 y.o. female who presents today for right shoulder pain. The pain started about two months ago. The pain is occuring on the lateral aspect and anterior portion of her right shoulder. 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 Her pain is worsened by these activities.  She received an subacromial injection two weeks ago that have improved her symptoms. She is currently taking Voltaren and tylenol with minimal improvement of her symptoms.   PMHx - reviewed.  Contributory factors include: Former smoker (quit in March 2016), HTN, OA  Medications - voltaren, APAP,    ROS Per HPI   Exam:  Filed Vitals:   06/09/15 1415  BP: 130/75   Gen: NAD Cardiorespiratory - Normal respiratory effort/rate.  Shoulder: Laterality: right Appearance: symmetric, no erythema or ecchymosis  Tenderness: none Range of Motion: normal flexion and abduction  Maneuvers: Empty can: normal  Internal rotation: normal  External rotation: normal  Hawkin's test: neg No painful arc and no drop arm sign. Strength:  Bicep: 5/5 Grip: 5/5 Neurovascularly intact   Imaging:  Right shoulder x-ray: mild arthritic changes.  Limited US: Right shoulder Hypoechoic changes at insertion of subscapularis. This suggests a mild partial tear.  No edema observed in [facility] joint.  Findings suggestive rotator cuff tendinopathy.

## 2015-06-10 NOTE — Progress Notes (Signed)
Patient ID: [name], female   DOB: [birth year], 54 y.o.   MRN: [id number] [place] Attending Note: I have seen and examined this patient. I have discussed this patient with the resident and reviewed the assessment and plan as documented above. I agree with the resident's findings and plan. Chronic rotator cuff arthropathy. Long discussion with her regarding treatment. We'll start nitroglycerin protocol although I'm not sure is going to have a huge impact on her but I think it will help her progress through home exercise program. I did internal and external rotation exercises for her over the next 4-6 weeks and when I see her back I'll add supraspinatus strengthening although I don't want her to go to the full range of motion on that because I think she'll aggravate symptoms.

## 2015-08-07 ENCOUNTER — Other Ambulatory Visit: Payer: Self-pay | Admitting: Family Medicine

## 2015-08-07 DIAGNOSIS — M8949 Other hypertrophic osteoarthropathy, multiple sites: Secondary | ICD-10-CM

## 2015-08-07 DIAGNOSIS — M150 Primary generalized (osteo)arthritis: Principal | ICD-10-CM

## 2015-08-07 DIAGNOSIS — M159 Polyosteoarthritis, unspecified: Secondary | ICD-10-CM

## 2015-08-07 MED ORDER — DICLOFENAC SODIUM 75 MG PO TBEC: 75.0000 mg | DELAYED_RELEASE_TABLET | Freq: Two times a day (BID) | ORAL | Status: DC | PRN

## 2015-08-07 NOTE — Telephone Encounter (Signed)
Pt called and would like a refill on her Diclofenac called in. jw

## 2015-08-25 ENCOUNTER — Encounter: Payer: Self-pay | Admitting: Family Medicine

## 2015-08-25 ENCOUNTER — Ambulatory Visit (INDEPENDENT_AMBULATORY_CARE_PROVIDER_SITE_OTHER): Payer: BLUE CROSS/BLUE SHIELD | Admitting: Family Medicine

## 2015-08-25 VITALS — BP 128/59 | HR 66 | Ht 62.0 in | Wt 204.0 lb

## 2015-08-25 DIAGNOSIS — M25511 Pain in right shoulder: Secondary | ICD-10-CM

## 2015-08-25 NOTE — Assessment & Plan Note (Addendum)
She did not try the nitroglycerin patch, she was fairly consistent with the physical therapy. She does not want to consider a subacromial bursa injection. She would like to proceed with MRI which we have set up and I will contact her after get the results back. I did discuss with her that the ultimate treatment may still be corticosteroid injection, nitroglycerin therapy or formal physical therapy. There may not be a surgery that would be beneficial to her but the only do know for sure a think is to proceed with this MRI. Greater than 50% of our 25 minute office visit was spent in counseling and education regarding these issues.

## 2015-08-25 NOTE — Progress Notes (Signed)
   Subjective:    Patient ID: Sonya Walton, female    DOB: [birth year], 54 y.o.   MRN: [id number]  HPI Follow-up right shoulder pain. Last office visit we had given her prescription for the nitroglycerin patch but when she got home she became concerned about the mechanism of action so she did not try that. She has been pretty consistent with her home exercise program. She's had no improvement in her pain. In fact she thinks overhead and lateral motions such as putting on her sure, cause more pain now than they did. 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 She's having pain with certain activities and also pain at night. Rest is somewhat helpful but over-the-counter medications have not been useful.   Review of Systems Denies numbness or tingling in the right upper strandy. She's had no unusual joint swelling the right upper extremity. No fever, sweats, chills.    Objective:   Physical Exam  Vital signs are reviewed GEN.: Well-developed female no acute distress Shoulder: Bilaterally symmetrical. Right shoulder has full range of passive motion, she can abduct 210, forward flexion to 110, both limited by pain. Internal rotation limited also by pain she can place her hand at the lateral portion of her hip area biceps strength is normal. The strength in her rotator cuff is intact in all planes.  Ultrasound from last offices it was reviewed showing a questionable partial thickness tear in the supraspinatus.      Assessment & Plan:

## 2015-09-15 ENCOUNTER — Ambulatory Visit
Admission: RE | Admit: 2015-09-15 | Discharge: 2015-09-15 | Disposition: A | Discharge status: Home / Self Care | Payer: BLUE CROSS/BLUE SHIELD | Source: Ambulatory Visit | Attending: Family Medicine | Admitting: Family Medicine

## 2015-09-15 DIAGNOSIS — M25511 Pain in right shoulder: Secondary | ICD-10-CM

## 2015-09-22 ENCOUNTER — Encounter: Payer: Self-pay | Admitting: *Deleted

## 2015-09-22 DIAGNOSIS — M25511 Pain in right shoulder: Secondary | ICD-10-CM

## 2015-09-22 NOTE — Patient Instructions (Signed)
[facility] Malena Catholic, MD Wednesday November 23rd at 2:15pm [address], [place], [place] [address] Phone: [phone]

## 2015-12-11 ENCOUNTER — Other Ambulatory Visit: Payer: Self-pay | Admitting: Orthopedic Surgery

## 2015-12-22 ENCOUNTER — Telehealth: Payer: Self-pay | Admitting: Family Medicine

## 2015-12-22 DIAGNOSIS — M12811 Other specific arthropathies, not elsewhere classified, right shoulder: Secondary | ICD-10-CM

## 2015-12-22 NOTE — Telephone Encounter (Signed)
Patient asks PCP to complete Disability Form. Please, follow up.

## 2015-12-22 NOTE — Telephone Encounter (Signed)
Form placed in PCP box 

## 2015-12-24 NOTE — Telephone Encounter (Signed)
Patient called regarding disability forms.  Per Dr. Sherril Cong patient will need her surgeon to complete disability forms.  Per patient she is having surgery on her shoulder 01/26/16.  The surgeon has forms to complete and the first doctor that saw her need to complete these forms which was Dr. Parks Ranger.  Forms placed in Dr. Parks Ranger box for review.  Derl Barrow, RN

## 2015-12-30 ENCOUNTER — Other Ambulatory Visit: Payer: Self-pay | Admitting: Family Medicine

## 2015-12-30 DIAGNOSIS — M12819 Other specific arthropathies, not elsewhere classified, unspecified shoulder: Secondary | ICD-10-CM | POA: Insufficient documentation

## 2015-12-30 NOTE — Telephone Encounter (Addendum)
Called patient, discussed forms with Shatina Lockler today 12/30/15. She assisted in completing out the Short Term Disability Forms by [employer] as requested. See note below, this is in reference to Right should chronic pain first evaluated by me on 05/02/2015.  Summary of key parts of forms: Date first visit of current condition - 05/02/15 Did you advise your patient to stop working? - No Treated for similar condition in past? - No Is condition due to injury or illness involving employment? No Diagnosis: Partial tear of Right Rotator Cuff (ICD-10: M75.111) Secondary diagnosis: Chronic Right Rotator Cuff Arthropathy (ICD-10: M12.519) Date of last examination: 08/25/15 (Sports Medicine), has seen ortho since then Date of next examination: 01/26/16 (surgery, ortho) Symptoms - Right shoulder pain, some reduced Right shoulder ROM Diagnostic tests? - R shoulder X-ray (05/26/15), Right shoulder MRI (09/15/15) showing partial infraspinatus tear Treatment - S/p R shoulder steroid injection, NSAIDs (Diclofenac), Muscle relaxers (flexeril), PT, now Orthopedics for upcoming shoulder surgery When Expect to return to work? - Anticipate within 1 month following future R-shoulder arthroscopic surgery (on 01/26/16), has post-op follow-up on 02/02/16 with Dr Tamera Punt (Ortho) Patient no longer under my care - Referrals - Referred to: Dorcas Mcmurray, MD (Sports Med) and Tania Ade, MD [facility]) Functional capacity - all 100% continuously except Pushing/Pulling R-arm 34-66%, Reach above shoulder 34-66%, Climb / operate heavy machinery 0% (never), Lift up to 11-20 lbs 34-66%, not >20 lbs Work restrictions - no lifting weight > 20 lbs (subjective to chagne post-op, 01/26/16 follow-up with Dr Artemio Aly  Form placed up front at [facility] office for pick-up later today by patient to finish completing it with her information, 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  Copy of form placed in to be scanned pile at  [facility].  [name], [place], PGY-3

## 2015-12-31 DIAGNOSIS — M1990 Unspecified osteoarthritis, unspecified site: Secondary | ICD-10-CM

## 2015-12-31 DIAGNOSIS — M75111 Incomplete rotator cuff tear or rupture of right shoulder, not specified as traumatic: Secondary | ICD-10-CM

## 2015-12-31 HISTORY — DX: Unspecified osteoarthritis, unspecified site: M19.90

## 2015-12-31 HISTORY — DX: Incomplete rotator cuff tear or rupture of right shoulder, not specified as traumatic: M75.111

## 2016-01-19 ENCOUNTER — Encounter (HOSPITAL_BASED_OUTPATIENT_CLINIC_OR_DEPARTMENT_OTHER): Payer: Self-pay | Admitting: *Deleted

## 2016-01-19 DIAGNOSIS — R490 Dysphonia: Secondary | ICD-10-CM

## 2016-01-19 HISTORY — DX: Dysphonia: R49.0

## 2016-01-19 NOTE — Pre-Procedure Instructions (Signed)
To come for BMET and EKG 

## 2016-01-20 ENCOUNTER — Other Ambulatory Visit: Payer: Self-pay

## 2016-01-20 ENCOUNTER — Encounter (HOSPITAL_BASED_OUTPATIENT_CLINIC_OR_DEPARTMENT_OTHER)
Admission: RE | Admit: 2016-01-20 | Discharge: 2016-01-20 | Disposition: A | Discharge status: Home / Self Care | Payer: BLUE CROSS/BLUE SHIELD | Source: Ambulatory Visit | Attending: Orthopedic Surgery | Admitting: Orthopedic Surgery

## 2016-01-20 DIAGNOSIS — M25511 Pain in right shoulder: Secondary | ICD-10-CM | POA: Diagnosis present

## 2016-01-20 LAB — BASIC METABOLIC PANEL
Anion gap: 13 (ref 5–15)
BUN: 8 mg/dL (ref 6–20)
CO2: 24 mmol/L (ref 22–32)
Calcium: 9.5 mg/dL (ref 8.9–10.3)
Chloride: 103 mmol/L (ref 101–111)
Creatinine, Ser: 0.66 mg/dL (ref 0.44–1.00)
GFR calc Af Amer: >60 mL/min (ref >60)
GFR calc non Af Amer: >60 mL/min (ref >60)
Glucose, Bld: 160 mg/dL — ABNORMAL HIGH (ref 65–99)
Potassium: 3.1 mmol/L — ABNORMAL LOW (ref 3.5–5.1)
Sodium: 140 mmol/L (ref 135–145)

## 2016-01-20 NOTE — Progress Notes (Signed)
3.1 potassium cleared by Dr Al Corpus

## 2016-01-26 ENCOUNTER — Ambulatory Visit (HOSPITAL_BASED_OUTPATIENT_CLINIC_OR_DEPARTMENT_OTHER)
Admission: RE | Admit: 2016-01-26 | Discharge: 2016-01-26 | Disposition: A | Discharge status: Home / Self Care | Payer: BLUE CROSS/BLUE SHIELD | Source: Ambulatory Visit | Attending: Orthopedic Surgery | Admitting: Orthopedic Surgery

## 2016-01-26 ENCOUNTER — Encounter (HOSPITAL_BASED_OUTPATIENT_CLINIC_OR_DEPARTMENT_OTHER)
Admission: RE | Disposition: A | Discharge status: Home / Self Care | Payer: Self-pay | Source: Ambulatory Visit | Attending: Orthopedic Surgery

## 2016-01-26 ENCOUNTER — Ambulatory Visit (HOSPITAL_BASED_OUTPATIENT_CLINIC_OR_DEPARTMENT_OTHER): Payer: BLUE CROSS/BLUE SHIELD | Admitting: Anesthesiology

## 2016-01-26 ENCOUNTER — Encounter (HOSPITAL_BASED_OUTPATIENT_CLINIC_OR_DEPARTMENT_OTHER): Payer: Self-pay | Admitting: Anesthesiology

## 2016-01-26 DIAGNOSIS — M75111 Incomplete rotator cuff tear or rupture of right shoulder, not specified as traumatic: Secondary | ICD-10-CM | POA: Insufficient documentation

## 2016-01-26 DIAGNOSIS — I10 Essential (primary) hypertension: Secondary | ICD-10-CM | POA: Insufficient documentation

## 2016-01-26 DIAGNOSIS — M19011 Primary osteoarthritis, right shoulder: Secondary | ICD-10-CM | POA: Insufficient documentation

## 2016-01-26 DIAGNOSIS — F17210 Nicotine dependence, cigarettes, uncomplicated: Secondary | ICD-10-CM | POA: Insufficient documentation

## 2016-01-26 HISTORY — DX: Presence of dental prosthetic device (complete) (partial): Z97.2

## 2016-01-26 HISTORY — DX: Unspecified osteoarthritis, unspecified site: M19.90

## 2016-01-26 HISTORY — DX: Incomplete rotator cuff tear or rupture of right shoulder, not specified as traumatic: M75.111

## 2016-01-26 HISTORY — PX: SHOULDER ARTHROSCOPY WITH DISTAL CLAVICLE RESECTION: SHX5675

## 2016-01-26 HISTORY — DX: Unspecified cataract: H26.9

## 2016-01-26 HISTORY — DX: Dysphonia: R49.0

## 2016-01-26 HISTORY — PX: SHOULDER ARTHROSCOPY WITH SUBACROMIAL DECOMPRESSION: SHX5684

## 2016-01-26 SURGERY — SHOULDER ARTHROSCOPY WITH DISTAL CLAVICLE RESECTION
Anesthesia: General | Site: Shoulder | Laterality: Right

## 2016-01-26 MED ORDER — PROPOFOL 10 MG/ML IV BOLUS
INTRAVENOUS | Status: AC
  Filled 2016-01-26: qty 40

## 2016-01-26 MED ORDER — FENTANYL CITRATE (PF) 100 MCG/2ML IJ SOLN
INTRAMUSCULAR | Status: AC
  Filled 2016-01-26: qty 2

## 2016-01-26 MED ORDER — BUPIVACAINE-EPINEPHRINE (PF) 0.5% -1:200000 IJ SOLN
INTRAMUSCULAR | Status: DC | PRN
  Administered 2016-01-26: 30 mL

## 2016-01-26 MED ORDER — DEXAMETHASONE SODIUM PHOSPHATE 10 MG/ML IJ SOLN
INTRAMUSCULAR | Status: AC
  Filled 2016-01-26: qty 1

## 2016-01-26 MED ORDER — DEXAMETHASONE SODIUM PHOSPHATE 4 MG/ML IJ SOLN
INTRAMUSCULAR | Status: DC | PRN
  Administered 2016-01-26: 10 mg via INTRAVENOUS

## 2016-01-26 MED ORDER — ONDANSETRON HCL 4 MG/2ML IJ SOLN
INTRAMUSCULAR | Status: AC
  Filled 2016-01-26: qty 2

## 2016-01-26 MED ORDER — MIDAZOLAM HCL 5 MG/5ML IJ SOLN
INTRAMUSCULAR | Status: DC | PRN
  Administered 2016-01-26 (×2): 1 mg via INTRAVENOUS

## 2016-01-26 MED ORDER — MIDAZOLAM HCL 2 MG/2ML IJ SOLN
INTRAMUSCULAR | Status: AC
  Filled 2016-01-26: qty 2

## 2016-01-26 MED ORDER — FENTANYL CITRATE (PF) 100 MCG/2ML IJ SOLN
50.0000 ug | INTRAMUSCULAR | Status: DC | PRN
Start: 2016-01-26 — End: 2016-01-26
  Administered 2016-01-26: 50 ug via INTRAVENOUS

## 2016-01-26 MED ORDER — POVIDONE-IODINE 7.5 % EX SOLN: Freq: Once | CUTANEOUS | Status: DC

## 2016-01-26 MED ORDER — ONDANSETRON HCL 4 MG/2ML IJ SOLN: 4.0000 mg | Freq: Once | INTRAMUSCULAR | Status: DC | PRN

## 2016-01-26 MED ORDER — PHENYLEPHRINE HCL 10 MG/ML IJ SOLN
INTRAMUSCULAR | Status: DC | PRN
  Administered 2016-01-26: 80 ug via INTRAVENOUS

## 2016-01-26 MED ORDER — DOCUSATE SODIUM 100 MG PO CAPS: 100.0000 mg | ORAL_CAPSULE | Freq: Three times a day (TID) | ORAL | Status: DC | PRN

## 2016-01-26 MED ORDER — SODIUM CHLORIDE 0.9 % IR SOLN
Status: DC | PRN
  Administered 2016-01-26: 3000 mL

## 2016-01-26 MED ORDER — FENTANYL CITRATE (PF) 100 MCG/2ML IJ SOLN: 25.0000 ug | INTRAMUSCULAR | Status: DC | PRN

## 2016-01-26 MED ORDER — GLYCOPYRROLATE 0.2 MG/ML IJ SOLN: 0.2000 mg | Freq: Once | INTRAMUSCULAR | Status: DC | PRN

## 2016-01-26 MED ORDER — ONDANSETRON HCL 4 MG/2ML IJ SOLN
INTRAMUSCULAR | Status: DC | PRN
  Administered 2016-01-26: 4 mg via INTRAVENOUS

## 2016-01-26 MED ORDER — CEFAZOLIN SODIUM 1 G IJ SOLR
2.0000 g | INTRAMUSCULAR | Status: AC
  Administered 2016-01-26: 2 g via INTRAVENOUS

## 2016-01-26 MED ORDER — MIDAZOLAM HCL 2 MG/2ML IJ SOLN
1.0000 mg | INTRAMUSCULAR | Status: DC | PRN
Start: 2016-01-26 — End: 2016-01-26
  Administered 2016-01-26: 1 mg via INTRAVENOUS

## 2016-01-26 MED ORDER — OXYCODONE-ACETAMINOPHEN 5-325 MG PO TABS: 1.0000 | ORAL_TABLET | ORAL | Status: DC | PRN

## 2016-01-26 MED ORDER — SUCCINYLCHOLINE CHLORIDE 20 MG/ML IJ SOLN
INTRAMUSCULAR | Status: DC | PRN
  Administered 2016-01-26: 50 mg via INTRAVENOUS

## 2016-01-26 MED ORDER — ALBUTEROL SULFATE HFA 108 (90 BASE) MCG/ACT IN AERS
INHALATION_SPRAY | RESPIRATORY_TRACT | Status: AC
  Filled 2016-01-26: qty 6.7

## 2016-01-26 MED ORDER — CEFAZOLIN SODIUM-DEXTROSE 2-4 GM/100ML-% IV SOLN
INTRAVENOUS | Status: AC
Start: 2016-01-26 — End: 2016-01-26
  Filled 2016-01-26: qty 100

## 2016-01-26 MED ORDER — PROPOFOL 10 MG/ML IV BOLUS
INTRAVENOUS | Status: DC | PRN
  Administered 2016-01-26: 150 mg via INTRAVENOUS

## 2016-01-26 MED ORDER — FENTANYL CITRATE (PF) 100 MCG/2ML IJ SOLN
INTRAMUSCULAR | Status: DC | PRN
  Administered 2016-01-26: 50 ug via INTRAVENOUS

## 2016-01-26 MED ORDER — SCOPOLAMINE 1 MG/3DAYS TD PT72: 1.0000 | MEDICATED_PATCH | Freq: Once | TRANSDERMAL | Status: DC | PRN

## 2016-01-26 MED ORDER — LIDOCAINE HCL (CARDIAC) 20 MG/ML IV SOLN
INTRAVENOUS | Status: AC
  Filled 2016-01-26: qty 5

## 2016-01-26 MED ORDER — LACTATED RINGERS IV SOLN
INTRAVENOUS | Status: DC
  Administered 2016-01-26 (×2): via INTRAVENOUS

## 2016-01-26 MED ORDER — LIDOCAINE HCL (CARDIAC) 20 MG/ML IV SOLN
INTRAVENOUS | Status: DC | PRN
  Administered 2016-01-26: 50 mg via INTRAVENOUS

## 2016-01-26 SURGICAL SUPPLY — 82 items
APL SKNCLS STERI-STRIP NONHPOA (GAUZE/BANDAGES/DRESSINGS)
BENZOIN TINCTURE PRP APPL 2/3 (GAUZE/BANDAGES/DRESSINGS) IMPLANT
BLADE CLIPPER SURG (BLADE) IMPLANT
BLADE SURG 15 STRL LF DISP TIS (BLADE) IMPLANT
BLADE SURG 15 STRL SS (BLADE)
BUR OVAL 4.0 (BURR) ×2 IMPLANT
CANNULA 5.75X71 LONG (CANNULA) ×2 IMPLANT
CANNULA TWIST IN 8.25X7CM (CANNULA) IMPLANT
CHLORAPREP W/TINT 26ML (MISCELLANEOUS) ×2 IMPLANT
DECANTER SPIKE VIAL GLASS SM (MISCELLANEOUS) IMPLANT
DRAPE IMP U-DRAPE 54X76 (DRAPES) ×2 IMPLANT
DRAPE INCISE IOBAN 66X45 STRL (DRAPES) ×2 IMPLANT
DRAPE STERI 35X30 U-POUCH (DRAPES) ×2 IMPLANT
DRAPE SURG 17X23 STRL (DRAPES) ×2 IMPLANT
DRAPE U-SHAPE 47X51 STRL (DRAPES) ×2 IMPLANT
DRAPE U-SHAPE 76X120 STRL (DRAPES) ×4 IMPLANT
DRSG PAD ABDOMINAL 8X10 ST (GAUZE/BANDAGES/DRESSINGS) ×2 IMPLANT
ELECT REM PT RETURN 9FT ADLT (ELECTROSURGICAL) ×2
ELECTRODE REM PT RTRN 9FT ADLT (ELECTROSURGICAL) ×1 IMPLANT
GAUZE SPONGE 4X4 12PLY STRL (GAUZE/BANDAGES/DRESSINGS) ×2 IMPLANT
GAUZE SPONGE 4X4 16PLY XRAY LF (GAUZE/BANDAGES/DRESSINGS) IMPLANT
GAUZE XEROFORM 1X8 LF (GAUZE/BANDAGES/DRESSINGS) ×2 IMPLANT
GLOVE BIO SURGEON STRL SZ 6.5 (GLOVE) ×1 IMPLANT
GLOVE BIO SURGEON STRL SZ7 (GLOVE) ×2 IMPLANT
GLOVE BIO SURGEON STRL SZ7.5 (GLOVE) ×2 IMPLANT
GLOVE BIOGEL PI IND STRL 7.0 (GLOVE) ×1 IMPLANT
GLOVE BIOGEL PI IND STRL 8 (GLOVE) ×1 IMPLANT
GLOVE BIOGEL PI INDICATOR 7.0 (GLOVE) ×3
GLOVE BIOGEL PI INDICATOR 8 (GLOVE) ×1
GOWN STRL REUS W/ TWL LRG LVL3 (GOWN DISPOSABLE) ×2 IMPLANT
GOWN STRL REUS W/ TWL XL LVL3 (GOWN DISPOSABLE) ×1 IMPLANT
GOWN STRL REUS W/TWL LRG LVL3 (GOWN DISPOSABLE) ×6
GOWN STRL REUS W/TWL XL LVL3 (GOWN DISPOSABLE) ×2
LASSO CRESCENT QUICKPASS (SUTURE) IMPLANT
LIQUID BAND (GAUZE/BANDAGES/DRESSINGS) IMPLANT
MANIFOLD NEPTUNE II (INSTRUMENTS) ×2 IMPLANT
NDL 1/2 CIR CATGUT .05X1.09 (NEEDLE) IMPLANT
NDL SCORPION MULTI FIRE (NEEDLE) IMPLANT
NDL SUT 6 .5 CRC .975X.05 MAYO (NEEDLE) IMPLANT
NEEDLE 1/2 CIR CATGUT .05X1.09 (NEEDLE) IMPLANT
NEEDLE MAYO TAPER (NEEDLE)
NEEDLE SCORPION MULTI FIRE (NEEDLE) IMPLANT
NS IRRIG 1000ML POUR BTL (IV SOLUTION) IMPLANT
PACK ARTHROSCOPY DSU (CUSTOM PROCEDURE TRAY) ×2 IMPLANT
PACK BASIN DAY SURGERY FS (CUSTOM PROCEDURE TRAY) ×2 IMPLANT
PENCIL BUTTON HOLSTER BLD 10FT (ELECTRODE) IMPLANT
RESECTOR FULL RADIUS 4.2MM (BLADE) ×2 IMPLANT
SHEET MEDIUM DRAPE 40X70 STRL (DRAPES) IMPLANT
SLEEVE SCD COMPRESS KNEE MED (MISCELLANEOUS) ×2 IMPLANT
SLING ARM FOAM STRAP LRG (SOFTGOODS) ×1 IMPLANT
SLING ARM IMMOBILIZER MED (SOFTGOODS) IMPLANT
SLING ARM MED ADULT FOAM STRAP (SOFTGOODS) IMPLANT
SLING ARM XL FOAM STRAP (SOFTGOODS) IMPLANT
SPONGE LAP 4X18 X RAY DECT (DISPOSABLE) IMPLANT
STRIP CLOSURE SKIN 1/2X4 (GAUZE/BANDAGES/DRESSINGS) IMPLANT
SUCTION FRAZIER HANDLE 10FR (MISCELLANEOUS)
SUCTION TUBE FRAZIER 10FR DISP (MISCELLANEOUS) IMPLANT
SUPPORT WRAP ARM LG (MISCELLANEOUS) IMPLANT
SUT BONE WAX W31G (SUTURE) IMPLANT
SUT ETHIBOND 2 OS 4 DA (SUTURE) IMPLANT
SUT ETHILON 3 0 PS 1 (SUTURE) ×2 IMPLANT
SUT ETHILON 4 0 PS 2 18 (SUTURE) IMPLANT
SUT FIBERWIRE #2 38 T-5 BLUE (SUTURE)
SUT MNCRL AB 3-0 PS2 18 (SUTURE) IMPLANT
SUT MNCRL AB 4-0 PS2 18 (SUTURE) IMPLANT
SUT PDS AB 0 CT 36 (SUTURE) IMPLANT
SUT PROLENE 3 0 PS 2 (SUTURE) IMPLANT
SUT TIGER TAPE 7 IN WHITE (SUTURE) IMPLANT
SUT VIC AB 0 CT1 27 (SUTURE)
SUT VIC AB 0 CT1 27XBRD ANBCTR (SUTURE) IMPLANT
SUT VIC AB 2-0 SH 27 (SUTURE)
SUT VIC AB 2-0 SH 27XBRD (SUTURE) IMPLANT
SUTURE FIBERWR #2 38 T-5 BLUE (SUTURE) IMPLANT
SYR BULB 3OZ (MISCELLANEOUS) IMPLANT
TAPE FIBER 2MM 7IN #2 BLUE (SUTURE) IMPLANT
TOWEL OR 17X24 6PK STRL BLUE (TOWEL DISPOSABLE) ×2 IMPLANT
TOWEL OR NON WOVEN STRL DISP B (DISPOSABLE) ×2 IMPLANT
TUBE CONNECTING 20X1/4 (TUBING) ×2 IMPLANT
TUBING ARTHROSCOPY IRRIG 16FT (MISCELLANEOUS) ×2 IMPLANT
WAND STAR VAC 90 (SURGICAL WAND) ×2 IMPLANT
WATER STERILE IRR 1000ML POUR (IV SOLUTION) ×2 IMPLANT
YANKAUER SUCT BULB TIP NO VENT (SUCTIONS) IMPLANT

## 2016-01-26 NOTE — Anesthesia Procedure Notes (Addendum)
Anesthesia Regional Block:  Interscalene brachial plexus block  Pre-Anesthetic Checklist: ,, timeout performed, Correct Patient, Correct Site, Correct Laterality, Correct Procedure, Correct Position, site marked, Risks and benefits discussed,  Surgical consent,  Pre-op evaluation,  At surgeon's request and post-op pain management  Laterality: Right  Prep: Maximum Sterile Barrier Precautions used and chloraprep       Needles:  Injection technique: Single-shot  Needle Type: Echogenic Stimulator Needle     Needle Length: 10cm 10 cm Needle Gauge: 21 and 21 G    Additional Needles:  Procedures: ultrasound guided (picture in chart) and nerve stimulator Interscalene brachial plexus block Narrative:  Injection made incrementally with aspirations every 5 mL.  Performed by: Personally  Anesthesiologist: Lauretta Grill  Additional Notes: Patient tolerated the procedure well without complications   Procedure Name: Intubation Date/Time: 01/26/2016 11:37 AM Performed by: Toula Moos L Pre-anesthesia Checklist: Patient identified, Emergency Drugs available, Suction available, Patient being monitored and Timeout performed Patient Re-evaluated:Patient Re-evaluated prior to inductionOxygen Delivery Method: Circle System Utilized Preoxygenation: Pre-oxygenation with 100% oxygen Intubation Type: IV induction Ventilation: Mask ventilation without difficulty Laryngoscope Size: Miller and 3 Grade View: Grade II Tube type: Oral Tube size: 7.0 mm Number of attempts: 1 Airway Equipment and Method: Stylet and Oral airway Placement Confirmation: ETT inserted through vocal cords under direct vision,  positive ETCO2 and breath sounds checked- equal and bilateral Secured at: 21 cm Tube secured with: Tape Dental Injury: Teeth and Oropharynx as per pre-operative assessment

## 2016-01-26 NOTE — H&P (Signed)
Sonya Walton is an 55 y.o. female.   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  Past Medical History  Diagnosis Date  . Arthritis   . Hypertension     states under control with med., has been on med. since 2011  . Partial tear of right rotator cuff 12/2015  . Immature cataract   . Wears dentures     upper  . Hoarse voice quality 01/19/2016  . Osteoarthritis 12/2015    right AC joint    Past Surgical History  Procedure Laterality Date  . Tubal ligation  1981  . Cholecystectomy  01/17/2012    Procedure: LAPAROSCOPIC CHOLECYSTECTOMY;  Surgeon: Harl Bowie, MD;  Location: [facility];  Service: General;  Laterality: N/A;  . Bunionectomy Bilateral 1994  . Tonsillectomy  1975  . Total vaginal hysterectomy  03/05/2003  . Pubovaginal sling  03/05/2003    Family History  Problem Relation Age of Onset  . Hypertension Other   . Diabetes Other   . Asthma Other   . Liver cancer Father   . Cancer Father     lung and liver  . Diabetes type II Sister   . Diabetes type II Brother   . Heart disease Mother   . Cancer Paternal Aunt     lung  . Hyperlipidemia Mother   . Hypertension Mother   . Asthma Sister   . Diabetes Sister   . Hypertension Sister   . Early death Sister     premature twins   . Hypertension Brother   . Diabetes Brother   . Asthma Brother   . Cancer      lung  . Heart failure Mother   . Breast cancer Sister 68  . COPD Sister    Social History:  reports that she has been smoking Cigarettes.  She has a 34 pack-year smoking history. She has never used smokeless tobacco. She reports that she drinks alcohol. She reports that she does not use illicit drugs.  Allergies: No Known Allergies  Medications Prior to Admission  Medication Sig Dispense Refill  . atenolol-chlorthalidone (TENORETIC) 50-25 MG per tablet TAKE ONE TABLET BY MOUTH ONCE DAILY 90 tablet 3  . Cholecalciferol (VITAMIN D3)  2000 UNITS TABS Take 2,000 Units by mouth every morning.    . diclofenac (VOLTAREN) 75 MG EC tablet Take 1 tablet (75 mg total) by mouth 2 (two) times daily as needed. for pain 60 tablet 3    No results found for this or any previous visit (from the past 48 hour(s)). No results found.  Review of Systems  All other systems reviewed and are negative.   Blood pressure 127/92, pulse 90, temperature 98.9 F (37.2 C), temperature source Oral, resp. rate 16, height '5\' 2"'$  (1.575 m), weight 88.451 kg (195 lb), SpO2 97 %. Physical Exam  Constitutional: She is oriented to person, place, and time. She appears well-developed and well-nourished.  HENT:  Head: Atraumatic.  Eyes: EOM are normal.  Cardiovascular: Intact distal pulses.   Respiratory: Effort normal.  Musculoskeletal:  R shoulder pain with RC testing, TTP over [facility] joint  Neurological: She is alert and oriented to person, place, and time.  Skin: Skin is warm and dry.  Psychiatric: She has a normal mood and affect.     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 Plan R arth RC debridement vs RCR, SAD, DCR Risks /  benefits of surgery discussed Consent on chart  NPO for OR Preop antibiotics   Nita Sells, MD 01/26/2016, 11:19 AM

## 2016-01-26 NOTE — Transfer of Care (Signed)
Immediate Anesthesia Transfer of Care Note  Patient: Sonya Walton  Procedure(s) Performed: Procedure(s): RIGHT SHOULDER ARTHROSCOPY DEBRIDEMENT VS ROTATOR CUFF REPAIR, DISTAL CLAVICLE EXCISION AND SUBACROMIAL DECOMPRESSION (Right)  Patient Location: PACU  Anesthesia Type:GA combined with regional for post-op pain  Level of Consciousness: sedated  Airway & Oxygen Therapy: Patient Spontanous Breathing and Patient connected to face mask oxygen  Post-op Assessment: Report given to RN and Post -op Vital signs reviewed and stable  Post vital signs: Reviewed and stable  Last Vitals:  Filed Vitals:   01/26/16 1045 01/26/16 1100  BP: 128/86 127/92  Pulse: 87 90  Temp:    Resp: 16 16    Complications: No apparent anesthesia complications

## 2016-01-26 NOTE — Anesthesia Postprocedure Evaluation (Signed)
Anesthesia Post Note  Patient: Sonya Walton  Procedure(s) Performed: Procedure(s) (LRB): SHOULDER ARTHROSCOPY WITH DISTAL CLAVICLE RESECTION (Right) SHOULDER ARTHROSCOPY WITH SUBACROMIAL DECOMPRESSION DEBRIDEMENT (Right)  Patient location during evaluation: PACU Anesthesia Type: General Level of consciousness: awake and alert Pain management: pain level controlled Vital Signs Assessment: post-procedure vital signs reviewed and stable Respiratory status: spontaneous breathing, nonlabored ventilation, respiratory function stable and patient connected to nasal cannula oxygen Cardiovascular status: blood pressure returned to baseline and stable Postop Assessment: no signs of nausea or vomiting Anesthetic complications: no    Last Vitals:  Filed Vitals:   01/26/16 1315 01/26/16 1330  BP: 120/107 127/76  Pulse: 73 72  Temp:    Resp: 18 18    Last Pain:  Filed Vitals:   01/26/16 1333  PainSc: 1                  Ansen Sayegh JENNETTE

## 2016-01-26 NOTE — Anesthesia Preprocedure Evaluation (Signed)
Anesthesia Evaluation  Patient identified by MRN, date of birth, ID band Patient awake    Reviewed: Allergy & Precautions, NPO status , Patient's Chart, lab work & pertinent test results  History of Anesthesia Complications Negative for: history of anesthetic complications  Airway Mallampati: II  TM Distance: >3 FB Neck ROM: Full    Dental no notable dental hx. (+) Dental Advisory Given   Pulmonary Current Smoker,    Pulmonary exam normal breath sounds clear to auscultation       Cardiovascular hypertension, Pt. on medications Normal cardiovascular exam Rhythm:Regular Rate:Normal     Neuro/Psych negative neurological ROS  negative psych ROS   GI/Hepatic negative GI ROS, Neg liver ROS,   Endo/Other  obesity  Renal/GU negative Renal ROS  negative genitourinary   Musculoskeletal  (+) Arthritis ,   Abdominal   Peds negative pediatric ROS (+)  Hematology negative hematology ROS (+)   Anesthesia Other Findings   Reproductive/Obstetrics negative OB ROS                             Anesthesia Physical Anesthesia Plan  ASA: II  Anesthesia Plan: General   Post-op Pain Management: GA combined w/ Regional for post-op pain   Induction: Intravenous  Airway Management Planned: Oral ETT  Additional Equipment:   Intra-op Plan:   Post-operative Plan: Extubation in OR  Informed Consent: I have reviewed the patients History and Physical, chart, labs and discussed the procedure including the risks, benefits and alternatives for the proposed anesthesia with the patient or authorized representative who has indicated his/her understanding and acceptance.   Dental advisory given  Plan Discussed with: CRNA  Anesthesia Plan Comments:         Anesthesia Quick Evaluation

## 2016-01-26 NOTE — Progress Notes (Signed)
Assisted Dr. Lauretta Grill with right, ultrasound guided, interscalene  block. Side rails up, monitors on throughout procedure. See vital signs in flow sheet. Tolerated Procedure well.

## 2016-01-26 NOTE — Op Note (Signed)
Procedure(s):   SHAWNIKA PEPIN female 55 y.o. 01/26/2016  Procedure(s) and Anesthesia Type:  #1 right shoulder arthroscopic extensive debridement partial-thickness articular sided supraspinatus tear and subscapularis tear with debridement type I SLAP tear  #2 right shoulder arthroscopic subacromial decompression #3 right shoulder arthroscopic distal clavicle excision  Surgeon(s) and Role:    * Tania Ade, MD - Primary     Surgeon: Nita Sells   Assistants: Jeanmarie Hubert PA-C (Danielle was present and scrubbed throughout the procedure and was essential in positioning, assisting with the camera and instrumentation,, and closure)  Anesthesia: General endotracheal anesthesia with preoperative interscalene block given by the attending anesthesiologist      Procedure Detail   Estimated Blood Loss: Min         Drains: none  Blood Given: none         Specimens: none        Complications:  * No complications entered in OR log *         Disposition: PACU - hemodynamically stable.         Condition: stable    Procedure:   INDICATIONS FOR SURGERY: The patient is 55 y.o. female who has a long history of right shoulder pain which has failed extensive conservative management. She was found on MRI to have extensive partial tearing of her rotator cuff as well as degenerative changes at the before meals joint which were symptomatic. Ultimately she wished to go forward with surgical management to try and decrease pain and restore function. She understood risks benefits alternatives the procedure and wished to go forward with surgery.  OPERATIVE FINDINGS: Examination under anesthesia: No stiffness or instability.   DESCRIPTION OF PROCEDURE: The patient was identified in preoperative  holding area where I personally marked the operative site after  verifying site, side, and procedure with the patient. An interscalene block was given by the attending  anesthesiologist the holding area.  The patient was taken back to the operating room where general anesthesia was induced without complication and was placed in the beach-chair position with the back  elevated about 60 degrees and all extremities and head and neck carefully padded and  positioned.   The right upper extremity was then prepped and  draped in a standard sterile fashion. The appropriate time-out  procedure was carried out. The patient did receive IV antibiotics  within 30 minutes of incision.   A small posterior portal incision was made and the arthroscope was introduced into the joint. An anterior portal was then established above the subscapularis using needle localization. Small cannula was placed anteriorly. Diagnostic arthroscopy was then carried out.  She was noted to have partial articular sided tearing of the subscapularis involving about 10% of the articular sided tendon. This was debrided extensively down to stable healthy bleeding tendon. The superior rolled border was intact. Glenohumeral joint surfaces were intact. She was noted to have type I tearing of the superior labrum which was debrided back to healthy labrum. The proximal biceps tendon was not extensively involved. The undersurface of supraspinatus was noted to have extensive partial tearing extending into the infraspinatus. This involved about 20% of the thickness of the articular side. This was debrided back to healthy bleeding tendon. This was not felt to represent high-grade tear greater than 50% there is no penetration to full-thickness.  The arthroscope was then introduced into the subacromial space a standard lateral portal was established with needle localization. The shaver was used through the lateral portal to perform  extensive bursectomy. Coracoacromial ligament was examined and found to be frayed and erythematous indicating chronic impingement.  The bursal side of the rotator cuff was carefully examined  and probed and found to be completely intact.  The coracoacromial ligament was taken down off the anterior acromion with the ArthroCare exposing a moderate hooked anterior acromial spur. A high-speed bur was then used through the lateral portal to take down the anterior acromial spur from lateral to medial in a standard acromioplasty.  The acromioplasty was also viewed from the lateral portal and the bur was used as necessary to ensure that the acromion was completely flat from posterior to anterior.  The distal clavicle was exposed arthroscopically and the bur was used to take off the undersurface for approximately 8 mm from the lateral portal. The bur was then moved to an anterior portal position to complete the distal clavicle excision resecting about 8 mm of the distal clavicle and a smooth even fashion. This was viewed from anterior and lateral portals and felt to be complete.  The arthroscopic equipment was removed from the joint and the portals were closed with 3-0 nylon in an interrupted fashion. Sterile dressings were then applied including Xeroform 4 x 4's ABDs and tape. The patient was then allowed to awaken from general anesthesia, placed in a sling, transferred to the stretcher and taken to the recovery room in stable condition.   POSTOPERATIVE PLAN: The patient will be discharged home today and will followup in one week for suture removal and wound check.  We will get her into therapy right away.

## 2016-01-26 NOTE — Discharge Instructions (Signed)
Discharge Instructions after Arthroscopic Shoulder Surgery   A sling has been provided for you. You may remove the sling after 72 hours. The sling may be worn for your protection, if you are in a crowd.  Use ice on the shoulder intermittently over the first 48 hours after surgery.  Pain medication has been prescribed for you.  Use your medication liberally over the first 48 hours, and then begin to taper your use. You may take Extra Strength Tylenol or Tylenol only in place of the pain pills. DO NOT take ANY nonsteroidal anti-inflammatory pain medications: Advil, Motrin, Ibuprofen, Aleve, Naproxen, or Naprosyn.  You may remove your dressing after two days.  You may shower 5 days after surgery. The incision CANNOT get wet prior to 5 days. Simply allow the water to wash over the site and then pat dry. Do not rub the incision. Make sure your axilla (armpit) is completely dry after showering.  Take one aspirin a day for 2 weeks after surgery, unless you have an aspirin sensitivity/allergy or asthma.  Three to 5 times each day you should perform assisted overhead reaching and external rotation (outward turning) exercises with the operative arm. Both exercises should be done with the non-operative arm used as the "therapist arm" while the operative arm remains relaxed. Ten of each exercise should be done three to five times each day.    Overhead reach is helping to lift your stiff arm up as high as it will go. To stretch your overhead reach, lie flat on your back, relax, and grasp the wrist of the tight shoulder with your opposite hand. Using the power in your opposite arm, bring the stiff arm up as far as it is comfortable. Start holding it for ten seconds and then work up to where you can hold it for a count of 30. Breathe slowly and deeply while the arm is moved. Repeat this stretch ten times, trying to help the arm up a little higher each time.       External rotation is turning the arm out to  the side while your elbow stays close to your body. External rotation is best stretched while you are lying on your back. Hold a cane, yardstick, broom handle, or dowel in both hands. Bend both elbows to a right angle. Use steady, gentle force from your normal arm to rotate the hand of the stiff shoulder out away from your body. Continue the rotation as far as it will go comfortably, holding it there for a count of 10. Repeat this exercise ten times.     Please call [phone] during normal business hours or [phone] after hours for any problems. Including the following:  - excessive redness of the incisions - drainage for more than 4 days - fever of more than 101.5 F  *Please note that pain medications will not be refilled after hours or on weekends.    Post Anesthesia Home Care Instructions  Activity: Get plenty of rest for the remainder of the day. A responsible adult should stay with you for 24 hours following the procedure.  For the next 24 hours, DO NOT: -Drive a car -Paediatric nurse -Drink alcoholic beverages -Take any medication unless instructed by your physician -Make any legal decisions or sign important papers.  Meals: Start with liquid foods such as gelatin or soup. Progress to regular foods as tolerated. Avoid greasy, spicy, heavy foods. If nausea and/or vomiting occur, drink only clear liquids until the nausea and/or vomiting subsides. Call  your physician if vomiting continues.  Special Instructions/Symptoms: Your throat may feel dry or sore from the anesthesia or the breathing tube placed in your throat during surgery. If this causes discomfort, gargle with warm salt water. The discomfort should disappear within 24 hours.  If you had a scopolamine patch placed behind your ear for the management of post- operative nausea and/or vomiting:  1. The medication in the patch is effective for 72 hours, after which it should be removed.  Wrap patch in a tissue and  discard in the trash. Wash hands thoroughly with soap and water. 2. You may remove the patch earlier than 72 hours if you experience unpleasant side effects which may include dry mouth, dizziness or visual disturbances. 3. Avoid touching the patch. Wash your hands with soap and water after contact with the patch.     Regional Anesthesia Blocks  1. Numbness or the inability to move the "blocked" extremity may last from 3-48 hours after placement. The length of time depends on the medication injected and your individual response to the medication. If the numbness is not going away after 48 hours, call your surgeon.  2. The extremity that is blocked will need to be protected until the numbness is gone and the  Strength has returned. Because you cannot feel it, you will need to take extra care to avoid injury. Because it may be weak, you may have difficulty moving it or using it. You may not know what position it is in without looking at it while the block is in effect.  3. For blocks in the legs and feet, returning to weight bearing and walking needs to be done carefully. You will need to wait until the numbness is entirely gone and the strength has returned. You should be able to move your leg and foot normally before you try and bear weight or walk. You will need someone to be with you when you first try to ensure you do not fall and possibly risk injury.  4. Bruising and tenderness at the needle site are common side effects and will resolve in a few days.  5. Persistent numbness or new problems with movement should be communicated to the surgeon or the [place] [phone] [place] [phone]).

## 2016-01-27 ENCOUNTER — Encounter (HOSPITAL_BASED_OUTPATIENT_CLINIC_OR_DEPARTMENT_OTHER): Payer: Self-pay | Admitting: Orthopedic Surgery

## 2016-02-06 ENCOUNTER — Encounter (HOSPITAL_BASED_OUTPATIENT_CLINIC_OR_DEPARTMENT_OTHER): Payer: Self-pay | Admitting: Orthopedic Surgery

## 2016-02-20 ENCOUNTER — Ambulatory Visit: Payer: BLUE CROSS/BLUE SHIELD | Admitting: Family Medicine

## 2016-03-22 ENCOUNTER — Ambulatory Visit: Payer: BLUE CROSS/BLUE SHIELD | Admitting: Family Medicine

## 2016-04-05 ENCOUNTER — Ambulatory Visit: Payer: BLUE CROSS/BLUE SHIELD | Admitting: Family Medicine

## 2016-04-12 ENCOUNTER — Ambulatory Visit (INDEPENDENT_AMBULATORY_CARE_PROVIDER_SITE_OTHER): Payer: BLUE CROSS/BLUE SHIELD | Admitting: Family Medicine

## 2016-04-12 ENCOUNTER — Encounter: Payer: Self-pay | Admitting: Family Medicine

## 2016-04-12 VITALS — BP 137/77 | HR 74 | Temp 98.2°F | Ht 62.0 in | Wt 204.0 lb

## 2016-04-12 DIAGNOSIS — R739 Hyperglycemia, unspecified: Secondary | ICD-10-CM | POA: Diagnosis not present

## 2016-04-12 DIAGNOSIS — Z1159 Encounter for screening for other viral diseases: Secondary | ICD-10-CM

## 2016-04-12 DIAGNOSIS — M25512 Pain in left shoulder: Secondary | ICD-10-CM | POA: Diagnosis not present

## 2016-04-12 DIAGNOSIS — E1165 Type 2 diabetes mellitus with hyperglycemia: Secondary | ICD-10-CM | POA: Insufficient documentation

## 2016-04-12 DIAGNOSIS — I10 Essential (primary) hypertension: Secondary | ICD-10-CM | POA: Diagnosis not present

## 2016-04-12 DIAGNOSIS — E119 Type 2 diabetes mellitus without complications: Secondary | ICD-10-CM | POA: Insufficient documentation

## 2016-04-12 DIAGNOSIS — M25519 Pain in unspecified shoulder: Secondary | ICD-10-CM | POA: Insufficient documentation

## 2016-04-12 DIAGNOSIS — R7303 Prediabetes: Secondary | ICD-10-CM

## 2016-04-12 LAB — BASIC METABOLIC PANEL WITH GFR
BUN: 8 mg/dL (ref 7–25)
CO2: 27 mmol/L (ref 20–31)
Calcium: 9.3 mg/dL (ref 8.6–10.4)
Chloride: 103 mmol/L (ref 98–110)
Creat: 0.69 mg/dL (ref 0.50–1.05)
GFR, Est African American: >89 mL/min (ref >=60)
GFR, Est Non African American: >89 mL/min (ref >=60)
Glucose, Bld: 95 mg/dL (ref 65–99)
Potassium: 3.4 mmol/L — ABNORMAL LOW (ref 3.5–5.3)
Sodium: 139 mmol/L (ref 135–146)

## 2016-04-12 LAB — POCT GLYCOSYLATED HEMOGLOBIN (HGB A1C): Hemoglobin A1C: 6.1

## 2016-04-12 LAB — CBC
HCT: 47.3 % — ABNORMAL HIGH (ref 35.0–45.0)
Hemoglobin: 15.8 g/dL — ABNORMAL HIGH (ref 11.7–15.5)
MCH: 29.8 pg (ref 27.0–33.0)
MCHC: 33.4 g/dL (ref 32.0–36.0)
MCV: 89.2 fL (ref 80.0–100.0)
MPV: 9.7 fL (ref 7.5–12.5)
Platelets: 330 10*3/uL (ref 140–400)
RBC: 5.3 MIL/uL — ABNORMAL HIGH (ref 3.80–5.10)
RDW: 15 % (ref 11.0–15.0)
WBC: 8.6 10*3/uL (ref 3.8–10.8)

## 2016-04-12 LAB — LIPID PANEL
Cholesterol: 151 mg/dL (ref 125–200)
HDL: 42 mg/dL — ABNORMAL LOW (ref >=46)
LDL Cholesterol: 85 mg/dL (ref <130)
Total CHOL/HDL Ratio: 3.6 Ratio (ref <=5.0)
Triglycerides: 121 mg/dL (ref <150)
VLDL: 24 mg/dL (ref <30)

## 2016-04-12 MED ORDER — BACLOFEN 10 MG PO TABS: 10.0000 mg | ORAL_TABLET | Freq: Three times a day (TID) | ORAL | Status: DC | PRN

## 2016-04-12 MED ORDER — ATENOLOL 50 MG PO TABS: 50.0000 mg | ORAL_TABLET | Freq: Every day | ORAL | Status: DC

## 2016-04-12 MED ORDER — CHLORTHALIDONE 25 MG PO TABS: 25.0000 mg | ORAL_TABLET | Freq: Every day | ORAL | Status: DC

## 2016-04-12 NOTE — Progress Notes (Signed)
   Subjective:   LATERA MCLIN is a 55 y.o. female with a history of htn, R rotator cuff tear s/p recent repair here for left shoulder pain  EXTREMITY PAIN  Location: left shoulder, under (deep to) shoulder blade Pain started: 6 weeks ago Pain is: achy, spasm Severity: moderate Medications tried: tylenol, diclofenac help some Recent trauma: no, has been in rehab for other arm so may have been favoring Similar pain previously: no, different type of pain from her right shoulder  Symptoms Redness:no Swelling:no Fever: no Weakness: no Weight loss: no Rash: no   Review of Symptoms - see HPI PMH - Smoking status noted.     Objective:  BP 137/77 mmHg  Pulse 74  Temp(Src) 98.2 F (36.8 C) (Oral)  Ht '5\' 2"'$  (1.575 m)  Wt 204 lb (92.534 kg)  BMI 37.30 kg/m2  Gen:  55 y.o. female in NAD HEENT: NCAT, MMM, anicteric sclerae CV: RRR, no MRG Resp: Non-labored, CTAB, no wheezes noted Abd: Soft, NTND, BS present, no guarding or organomegaly Ext: WWP, no edema MSK:  Shoulder: Inspection reveals no abnormalities, atrophy or asymmetry. Palpation with tenderness over muscle between scapula and c-spine, no tenderness over AC joint or bicipital groove. ROM is full in all planes but worsening pain with extension behind back Rotator cuff strength normal throughout. No signs of impingement with negative Neer and Hawkin's tests, empty can sign. No labral pathology noted with negative Obrien's, negative clunk and good stability. Normal scapular function observed. No painful arc and no drop arm sign. No apprehension sign Neuro: Alert and oriented, speech normal    Assessment & Plan:     KHALIE WINCE is a 55 y.o. female here for shoulder pain  Pain in joint, shoulder region Pain under L shoulder blade, stiff, worsened by putting arm behind back, no pain with rotation or abduction, tender/palpable spasm of paraspinous muscles on exam - continue diclofenac - add baclofen for  spasm - gave gentle stretches - f/u in 2 weeks if not improving  Hypertension Well controlled on atenolol/chlorthalidone for years but on pharmacy back order - will send components separately, pt to call if needs other med change due to shortage      Beverlyn Roux, MD, MPH [place] [facility] PGY-3 04/12/2016 10:51 AM

## 2016-04-12 NOTE — Assessment & Plan Note (Signed)
Pain under L shoulder blade, stiff, worsened by putting arm behind back, no pain with rotation or abduction, tender/palpable spasm of paraspinous muscles on exam - continue diclofenac - add baclofen for spasm - gave gentle stretches - f/u in 2 weeks if not improving

## 2016-04-12 NOTE — Assessment & Plan Note (Signed)
Well controlled on atenolol/chlorthalidone for years but on pharmacy back order - will send components separately, pt to call if needs other med change due to shortage

## 2016-04-12 NOTE — Patient Instructions (Signed)
Shoulder Range of Motion Exercises Shoulder range of motion (ROM) exercises are designed to keep the shoulder moving freely. They are often recommended for people who have shoulder pain. MOVEMENT EXERCISE When you are able, do this exercise 5-6 days per week, or as told by your health care provider. Work toward doing 2 sets of 10 swings. Pendulum Exercise How To Do This Exercise Lying Down 1. Lie face-down on a bed with your abdomen close to the side of the bed. 2. Let your arm hang over the side of the bed. 3. Relax your shoulder, arm, and hand. 4. Slowly and gently swing your arm forward and back. Do not use your neck muscles to swing your arm. They should be relaxed. If you are struggling to swing your arm, have someone gently swing it for you. When you do this exercise for the first time, swing your arm at a 15 degree angle for 15 seconds, or swing your arm 10 times. As pain lessens over time, increase the angle of the swing to 30-45 degrees. 5. Repeat steps 1-4 with the other arm. How To Do This Exercise While Standing 1. Stand next to a sturdy chair or table and hold on to it with your hand.  Bend forward at the waist.  Bend your knees slightly.  Relax your other arm and let it hang limp.  Relax the shoulder blade of the arm that is hanging and let it drop.  While keeping your shoulder relaxed, use body motion to swing your arm in small circles. The first time you do this exercise, swing your arm for about 30 seconds or 10 times. When you do it next time, swing your arm for a little longer.  Stand up tall and relax.  Repeat steps 1-7, this time changing the direction of the circles. 2. Repeat steps 1-8 with the other arm. STRETCHING EXERCISES Do these exercises 3-4 times per day on 5-6 days per week or as told by your health care provider. Work toward holding the stretch for 20 seconds. Stretching Exercise 1 1. Lift your arm straight out in front of you. 2. Bend your arm 90  degrees at the elbow (right angle) so your forearm goes across your body and looks like the letter "L." 3. Use your other arm to gently pull the elbow forward and across your body. 4. Repeat steps 1-3 with the other arm. Stretching Exercise 2 You will need a towel or rope for this exercise. 1. Bend one arm behind your back with the palm facing outward. 2. Hold a towel with your other hand. 3. Reach the arm that holds the towel above your head, and bend that arm at the elbow. Your wrist should be behind your neck. 4. Use your free hand to grab the free end of the towel. 5. With the higher hand, gently pull the towel up behind you. 6. With the lower hand, pull the towel down behind you. 7. Repeat steps 1-6 with the other arm. STRENGTHENING EXERCISES Do each of these exercises at four different times of day (sessions) every day or as told by your health care provider. To begin with, repeat each exercise 5 times (repetitions). Work toward doing 3 sets of 12 repetitions or as told by your health care provider. Strengthening Exercise 1 You will need a light weight for this activity. As you grow stronger, you may use a heavier weight. 1. Standing with a weight in your hand, lift your arm straight out to the side  until it is at the same height as your shoulder. 2. Bend your arm at 90 degrees so that your fingers are pointing to the ceiling. 3. Slowly raise your hand until your arm is straight up in the air. 4. Repeat steps 1-3 with the other arm. Strengthening Exercise 2 You will need a light weight for this activity. As you grow stronger, you may use a heavier weight. 1. Standing with a weight in your hand, gradually move your straight arm in an arc, starting at your side, then out in front of you, then straight up over your head. 2. Gradually move your other arm in an arc, starting at your side, then out in front of you, then straight up over your head. 3. Repeat steps 1-2 with the other  arm. Strengthening Exercise 3 You will need an elastic band for this activity. As you grow stronger, gradually increase the size of the bands or increase the number of bands that you use at one time. 1. While standing, hold an elastic band in one hand and raise that arm up in the air. 2. With your other hand, pull down the band until that hand is by your side. 3. Repeat steps 1-2 with the other arm.   This information is not intended to replace advice given to you by your health care provider. Make sure you discuss any questions you have with your health care provider.   Document Released: 07/17/2003 Document Revised: 03/04/2015 Document Reviewed: 10/14/2014 Elsevier Interactive Patient Education [employer].

## 2016-04-12 NOTE — Assessment & Plan Note (Signed)
New diagnosis, asymptomatic - will need lifestyle counseling at next visit, discussion of possible meds

## 2016-04-13 LAB — HEPATITIS C ANTIBODY: HCV Ab: NEGATIVE

## 2016-04-13 NOTE — Progress Notes (Signed)
Quick Note:  Please inform patient, all labs normal except she is prediabetic. Please have her make appt with Dr. Jenne Campus or me to discuss diet and exercise changes to avoid progression to diabetes. Thanks! ______

## 2016-04-16 ENCOUNTER — Telehealth: Payer: Self-pay | Admitting: Family Medicine

## 2016-04-16 NOTE — Telephone Encounter (Signed)
Patient informed of message from MD, told her she would be hearing from our nutritionist about an appointment.

## 2016-04-16 NOTE — Telephone Encounter (Signed)
Would like lab results for OV w/ Dr. Sherril Cong on 04/12/16. Please call patient.

## 2016-04-16 NOTE — Telephone Encounter (Signed)
-----   Message from Frazier Richards, MD sent at 04/13/2016  7:27 AM EDT ----- Please inform patient, all labs normal except she is prediabetic. Please have her make appt with Dr. Jenne Campus or me to discuss diet and exercise changes to avoid progression to diabetes. Thanks!

## 2016-04-20 ENCOUNTER — Other Ambulatory Visit: Payer: Self-pay | Admitting: Family Medicine

## 2016-04-20 DIAGNOSIS — I10 Essential (primary) hypertension: Secondary | ICD-10-CM

## 2016-04-20 DIAGNOSIS — R7303 Prediabetes: Secondary | ICD-10-CM

## 2016-05-03 ENCOUNTER — Other Ambulatory Visit: Payer: Self-pay | Admitting: Family Medicine

## 2016-05-03 DIAGNOSIS — Z1231 Encounter for screening mammogram for malignant neoplasm of breast: Secondary | ICD-10-CM

## 2016-05-10 ENCOUNTER — Ambulatory Visit (INDEPENDENT_AMBULATORY_CARE_PROVIDER_SITE_OTHER): Payer: BLUE CROSS/BLUE SHIELD | Admitting: Family Medicine

## 2016-05-10 ENCOUNTER — Ambulatory Visit: Payer: PRIVATE HEALTH INSURANCE

## 2016-05-10 ENCOUNTER — Encounter: Payer: Self-pay | Admitting: Family Medicine

## 2016-05-10 VITALS — Ht 62.0 in | Wt 201.4 lb

## 2016-05-10 DIAGNOSIS — R7309 Other abnormal glucose: Secondary | ICD-10-CM | POA: Diagnosis not present

## 2016-05-10 NOTE — Patient Instructions (Addendum)
-   Sweet tea: Try ordering half-sweetened, half unsweet tea mixed together.    - Next step: Order unsweet tea, and sweeten yourself with a small amount of sugar or sweetener.    - The key to changing a taste preference is CONSISTENCY.   - Juice: If you absolutely want some juice, mix it with half seltzer water.   - Snacks:  UNsalted nuts and seeds (~2 tablespoons = 1 serving; yogurt; fruit, especially berries; 1/2 sandwich; cheese & whole-grain crackers.   - TASTE PREFERENCES ARE LEARNED.  This means that it will get easier to choose foods you know are good for you if you are exposed to them enough.  Remember what this process was like for you when you stopped drinking [facility]!  - Your Hemoglobin A1C was 6.1 in March.  This is a measure of how high your blood sugar has been in the previous 3 months.     Diet Recommendations for Diabetes   Starchy (carb) foods: Bread, rice, pasta, potatoes, corn, cereal, grits, crackers, bagels, muffins, all baked goods.  (Fruits, milk, and yogurt also have carbohydrate, but most of these foods will not spike your blood sugar as the starchy foods will.)  A few fruits do cause high blood sugars; use small portions of bananas (limit to 1/2 at a time), grapes, watermelon, oranges, and most tropical fruits.  Fruit JUICE is NOT equal to whole fruit.  Juices of any kind raise blood sugar!   Protein foods: Meat, fish, poultry, eggs, dairy foods, and beans such as pinto and kidney beans (beans also provide carbohydrate).   1. Eat at least 3 meals and 1-2 snacks per day. Never go more than 4-5 hours without eating while awake. Eat breakfast within the first hour of getting up.   2. Limit starchy foods to TWO per meal and ONE per snack. ONE portion of a starchy  food is equal to the following:   - ONE slice of bread (or its equivalent, such as half of a hamburger bun).   - 1/2 cup of a "scoopable" starchy food such as potatoes or rice.   - 15 grams of carbohydrate as  shown on food label.  3. Include at every meal: a protein food, a carb food, and vegetables and/or fruit.   - Obtain twice the volume of veg's as protein or carbohydrate foods for both lunch and dinner.   - Fresh or frozen veg's are best.   - Keep frozen veg's on hand for a quick vegetable serving.      - AT YOUR FOLLOW-UP APPT, LET'S TALK ABOUT PHYSICAL ACTIVITY.

## 2016-05-10 NOTE — Progress Notes (Signed)
Medical Nutrition Therapy:  Appt start time: 1130 end time:  1230.  Assessment:  Primary concerns today: Weight management and Blood sugar control.   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  She said she is too tired to exercise after work each day.  She has cut down on sugar since dx of pre-DM in March; now using artificial sweetener in coffee, and has been eating fewer sweets and carb's in general.    Usual eating pattern includes 2-3 meals and 1-2 snacks per day. Frequent foods and beverages include 1-2 c coffee, with sweetener, sweet tea, diet Pepsi, inst oatmeal, chx, potatoes, pasta, vegetables, fruit.  Avoided foods include beets.   Usual physical activity includes housekeeping, which includes much walking, etc.  24-hr recall: (Up at 7 AM; had 1 c coffee with 2 Splenda & 1.5 tsp Hazelnut creamer (25 kcal)) B (9:30 AM)-   1 sausage biscuit, water Snk ( AM)-   --- L (4 PM)-  6-in cold-cut & chs sub w/ mayo, water  Snk ( PM)-  --- D (7 PM)-  1 Mayflower fried platter: 3/4 c shrimp, 2 oz whitefish, 2 hush puppies, 1/2 baked pot, 2 T butter, 2 T sour cream, 1/2 c coleslaw, diet Pepsi Snk ( PM)-  Couple ranch chips Typical day? Yes.   except that eating out is usually only about twice a week.    Progress Towards Goal(s):  In progress.   Nutritional Diagnosis:  NI-5.8.2 Excessive carbohydrate intake As related to optimal nutriitonal balance.  As evidenced by excessive carb at 2 of 3 meals yesterday.    Intervention:  Education.  Handouts given during visit include:  AVS  Demonstrated degree of understanding via:  Teach Back  Barriers to learning/adherence to lifestyle change: longstanding food practices.  Monitoring/Evaluation:  Dietary intake, exercise, and body weight in 3 week(s).

## 2016-05-31 ENCOUNTER — Ambulatory Visit: Payer: PRIVATE HEALTH INSURANCE

## 2016-05-31 ENCOUNTER — Ambulatory Visit: Payer: PRIVATE HEALTH INSURANCE | Admitting: Family Medicine

## 2016-06-07 ENCOUNTER — Ambulatory Visit: Payer: PRIVATE HEALTH INSURANCE

## 2016-06-14 ENCOUNTER — Ambulatory Visit
Admission: RE | Admit: 2016-06-14 | Discharge: 2016-06-14 | Disposition: A | Discharge status: Home / Self Care | Payer: BLUE CROSS/BLUE SHIELD | Source: Ambulatory Visit | Attending: Family Medicine | Admitting: Family Medicine

## 2016-06-14 DIAGNOSIS — Z1231 Encounter for screening mammogram for malignant neoplasm of breast: Secondary | ICD-10-CM

## 2016-08-09 ENCOUNTER — Ambulatory Visit: Payer: PRIVATE HEALTH INSURANCE | Admitting: Family Medicine

## 2016-08-30 ENCOUNTER — Other Ambulatory Visit: Payer: Self-pay | Admitting: *Deleted

## 2016-08-30 DIAGNOSIS — M159 Polyosteoarthritis, unspecified: Secondary | ICD-10-CM

## 2016-08-30 DIAGNOSIS — M8949 Other hypertrophic osteoarthropathy, multiple sites: Secondary | ICD-10-CM

## 2016-08-30 DIAGNOSIS — M150 Primary generalized (osteo)arthritis: Principal | ICD-10-CM

## 2016-08-30 MED ORDER — DICLOFENAC SODIUM 75 MG PO TBEC: 75.0000 mg | DELAYED_RELEASE_TABLET | Freq: Two times a day (BID) | ORAL | 3 refills | Status: DC | PRN

## 2016-10-11 ENCOUNTER — Ambulatory Visit: Payer: BLUE CROSS/BLUE SHIELD | Admitting: Family Medicine

## 2016-11-22 ENCOUNTER — Ambulatory Visit (INDEPENDENT_AMBULATORY_CARE_PROVIDER_SITE_OTHER): Payer: BLUE CROSS/BLUE SHIELD | Admitting: Internal Medicine

## 2016-11-22 ENCOUNTER — Encounter: Payer: Self-pay | Admitting: Internal Medicine

## 2016-11-22 VITALS — BP 110/66 | HR 70 | Temp 98.3°F | Ht 62.0 in | Wt 194.4 lb

## 2016-11-22 DIAGNOSIS — L089 Local infection of the skin and subcutaneous tissue, unspecified: Secondary | ICD-10-CM

## 2016-11-22 MED ORDER — DOXYCYCLINE HYCLATE 100 MG PO CAPS: 100.0000 mg | ORAL_CAPSULE | Freq: Two times a day (BID) | ORAL | 0 refills | Status: DC

## 2016-11-22 NOTE — Progress Notes (Signed)
   [facility] Kerrin Mo, MD Phone: [phone]  Reason For Visit: Pimple on right breast   # Noticed it about 1 week ago while taking a shower, it was slightly bigger early in the week and has actually decreased in size.   Denies any pain associated with pimple  Has improved since when first noticed No fevers no chills   Past Medical History Reviewed problem list.  Medications- reviewed and updated No additions to family history  Objective: BP 110/66   Pulse 70   Temp 98.3 F (36.8 C) (Oral)   Ht '5\' 2"'$  (1.575 m)   Wt 194 lb 6.4 oz (88.2 kg)   SpO2 95%   BMI 35.56 kg/m  Gen: NAD, alert, cooperative with exam Skin: slightly erythematous 1cm pustule, no drain ble source, two small open pores with tiny amount of pus obtained on squeezing    Assessment/Plan: See problem based a/p  Skin pustule Consistent with a folliculitis.  - Warm compresses - Doxycycline 100 mg BID for 7 days

## 2016-11-22 NOTE — Patient Instructions (Signed)
Can take 7 days of doxycycline twice daily

## 2016-11-24 DIAGNOSIS — L089 Local infection of the skin and subcutaneous tissue, unspecified: Secondary | ICD-10-CM | POA: Insufficient documentation

## 2016-11-24 NOTE — Assessment & Plan Note (Signed)
Consistent with a folliculitis.  - Warm compresses - Doxycycline 100 mg BID for 7 days

## 2017-01-20 ENCOUNTER — Other Ambulatory Visit: Payer: Self-pay | Admitting: *Deleted

## 2017-01-20 DIAGNOSIS — I10 Essential (primary) hypertension: Secondary | ICD-10-CM

## 2017-01-20 MED ORDER — CHLORTHALIDONE 25 MG PO TABS: 25.0000 mg | ORAL_TABLET | Freq: Every day | ORAL | 3 refills | Status: DC

## 2017-01-24 ENCOUNTER — Other Ambulatory Visit: Payer: Self-pay | Admitting: Family Medicine

## 2017-01-24 DIAGNOSIS — I10 Essential (primary) hypertension: Secondary | ICD-10-CM

## 2017-01-24 MED ORDER — ATENOLOL 50 MG PO TABS: 50.0000 mg | ORAL_TABLET | Freq: Every day | ORAL | 3 refills | Status: DC

## 2017-01-24 NOTE — Telephone Encounter (Signed)
Patient calling to request refill of:  Name of Medication(s):  atenolol  Last date of OV:  11/24/16 [facility]acy:  [facility] [address]  Will route refill request to Clinic RN.  Discussed with patient policy to call pharmacy for future refills.  Also, discussed refills may take up to 48 hours to approve or deny.  [place]

## 2017-03-10 ENCOUNTER — Encounter (HOSPITAL_COMMUNITY): Payer: Self-pay | Admitting: Emergency Medicine

## 2017-03-10 ENCOUNTER — Ambulatory Visit (HOSPITAL_COMMUNITY)
Admission: EM | Admit: 2017-03-10 | Discharge: 2017-03-10 | Disposition: A | Discharge status: Home / Self Care | Payer: BLUE CROSS/BLUE SHIELD | Attending: Internal Medicine | Admitting: Internal Medicine

## 2017-03-10 DIAGNOSIS — J069 Acute upper respiratory infection, unspecified: Secondary | ICD-10-CM

## 2017-03-10 MED ORDER — BENZONATATE 100 MG PO CAPS
100.0000 mg | ORAL_CAPSULE | Freq: Three times a day (TID) | ORAL | 0 refills | Status: DC
Start: 2017-03-10 — End: 2017-11-24

## 2017-03-10 MED ORDER — IPRATROPIUM BROMIDE 0.06 % NA SOLN: 2.0000 | Freq: Four times a day (QID) | NASAL | 0 refills | Status: DC

## 2017-03-10 NOTE — ED Triage Notes (Signed)
Pt. Stated, I've had a cough runny nose and both my ears are hurting. This started 2 days ago.

## 2017-03-10 NOTE — ED Provider Notes (Signed)
CSN: [id number]     Arrival date & time 03/10/17  1615 History   First MD Initiated Contact with Patient 03/10/17 1818     Chief Complaint  Patient presents with  . Nasal Congestion  . Cough   (Consider location/radiation/quality/duration/timing/severity/associated sxs/prior Treatment) The history is provided by the patient.  Cough  Cough characteristics:  Non-productive, dry and hacking Sputum characteristics:  Clear Severity:  Moderate Onset quality:  Gradual Duration:  3 days Timing:  Constant Progression:  Unchanged Chronicity:  New Smoker: yes   Context: upper respiratory infection   Relieved by:  None tried Worsened by:  Nothing Ineffective treatments:  None tried Associated symptoms: chills, ear fullness, rhinorrhea and sinus congestion   Associated symptoms: no chest pain, no ear pain, no fever, no shortness of breath, no sore throat and no wheezing     Past Medical History:  Diagnosis Date  . Arthritis   . Hoarse voice quality 01/19/2016  . Hypertension    states under control with med., has been on med. since 2011  . Immature cataract   . 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 Partial tear of right rotator cuff 12/2015  . Wears dentures    upper   Past Surgical History:  Procedure Laterality Date  . BUNIONECTOMY Bilateral 1994  . CHOLECYSTECTOMY  01/17/2012   Procedure: LAPAROSCOPIC CHOLECYSTECTOMY;  Surgeon: Harl Bowie, MD;  Location: [place];  Service: General;  Laterality: N/A;  . PUBOVAGINAL SLING  03/05/2003  . SHOULDER ARTHROSCOPY WITH DISTAL CLAVICLE RESECTION Right 01/26/2016   Procedure: SHOULDER ARTHROSCOPY WITH DISTAL CLAVICLE RESECTION;  Surgeon: Tania Ade, MD;  Location: [place];  Service: Orthopedics;  Laterality: Right;  . SHOULDER ARTHROSCOPY WITH SUBACROMIAL DECOMPRESSION Right 01/26/2016   Procedure: SHOULDER ARTHROSCOPY WITH SUBACROMIAL DECOMPRESSION DEBRIDEMENT;  Surgeon: Tania Ade, MD;  Location: [place];  Service: Orthopedics;  Laterality: Right;  . TONSILLECTOMY  1975  . TOTAL VAGINAL HYSTERECTOMY  03/05/2003  . TUBAL LIGATION  1981   Family History  Problem Relation Age of Onset  . Liver cancer Father   . Cancer Father        lung and liver  . Heart disease Mother   . Hyperlipidemia Mother   . Hypertension Mother   . Heart failure Mother   . Hypertension Other   . Diabetes Other   . Asthma Other   . Diabetes type II Sister   . Diabetes type II Brother   . Cancer Paternal Aunt        lung  . Asthma Sister   . Diabetes Sister   . Hypertension Sister   . Early death Sister        premature twins   . Hypertension Brother   . Diabetes Brother   . Asthma Brother   . Cancer Unknown        lung  . Breast cancer Sister 38  . COPD Sister    Social History  Substance Use Topics  . Smoking status: Current Every Day Smoker    Packs/day: 1.00    Years: 34.00    Types: Cigarettes  . Smokeless tobacco: Never Used  . Alcohol use 0.0 oz/week     Comment: rarely   OB History    Gravida Para Term Preterm AB Living   '2 2 2     2   '$ SAB TAB Ectopic Multiple Live Births  Review of Systems  Constitutional: Positive for chills. Negative for fever.  HENT: Positive for congestion and rhinorrhea. Negative for ear pain, sinus pain, sinus pressure, sore throat and trouble swallowing.   Respiratory: Positive for cough. Negative for shortness of breath and wheezing.   Cardiovascular: Negative for chest pain and palpitations.  Gastrointestinal: Positive for diarrhea. Negative for abdominal pain, nausea and vomiting.  Musculoskeletal: Negative.   Skin: Negative.   Neurological: Negative.     Allergies  Patient has no known allergies.  Home Medications   Prior to Admission medications   Medication Sig Start Date End Date Taking? Authorizing Provider  atenolol (TENORMIN) 50 MG tablet Take 1 tablet (50 mg total) by mouth daily. 01/24/17   McKeag, Marylynn Pearson, MD  atenolol-chlorthalidone (TENORETIC) 50-25 MG per tablet TAKE ONE TABLET BY MOUTH ONCE DAILY 04/21/15   Frazier Richards, MD  benzonatate (TESSALON) 100 MG capsule Take 1 capsule (100 mg total) by mouth every 8 (eight) hours. 03/10/17   Barnet Glasgow, NP  chlorthalidone (HYGROTON) 25 MG tablet Take 1 tablet (25 mg total) by mouth daily. 01/20/17   McKeag, Marylynn Pearson, MD  Cholecalciferol (VITAMIN D3) 2000 UNITS TABS Take 2,000 Units by mouth every morning.    [provider]  diclofenac (VOLTAREN) 75 MG EC tablet Take 1 tablet (75 mg total) by mouth 2 (two) times daily as needed. for pain 08/30/16   McKeag, Marylynn Pearson, MD  docusate sodium (COLACE) 100 MG capsule Take 1 capsule (100 mg total) by mouth 3 (three) times daily as needed. Patient not taking: Reported on 05/10/2016 01/26/16   Grier Mitts, PA-C  doxycycline (VIBRAMYCIN) 100 MG capsule Take 1 capsule (100 mg total) by mouth 2 (two) times daily. 11/22/16   Mikell, Jeani Sow, MD  ipratropium (ATROVENT) 0.06 % nasal spray Place 2 sprays into both nostrils 4 (four) times daily. 03/10/17   Barnet Glasgow, NP   Meds Ordered and Administered this Visit  Medications - No data to display  BP 127/75   Pulse 76   Temp 98.5 F (36.9 C) (Oral)   Resp 17   Ht '5\' 1"'$  (1.549 m)   Wt 190 lb (86.2 kg)   SpO2 97%   BMI 35.90 kg/m  No data found.   Physical Exam  Constitutional: She is oriented to person, place, and time. She appears well-developed and well-nourished. She does not have a sickly appearance. She does not appear ill. No distress.  HENT:  Head: Normocephalic and atraumatic.  Right Ear: Tympanic membrane and external ear normal.  Left Ear: Tympanic membrane and external ear normal.  Nose: Nose normal. Right sinus exhibits no maxillary sinus tenderness and no frontal sinus tenderness. Left sinus exhibits no maxillary sinus tenderness and no frontal sinus tenderness.  Mouth/Throat: Uvula is midline and oropharynx is clear  and moist. No oropharyngeal exudate.  Eyes: Conjunctivae are normal.  Neck: Normal range of motion. Neck supple. No JVD present.  Cardiovascular: Normal rate and regular rhythm.   Pulmonary/Chest: Effort normal and breath sounds normal. No respiratory distress. She has no wheezes.  Abdominal: Soft. Bowel sounds are normal. She exhibits no distension. There is no tenderness. There is no guarding.  Lymphadenopathy:       Head (right side): Submandibular adenopathy present. No submental and no tonsillar adenopathy present.       Head (left side): Submandibular adenopathy present. No submental and no tonsillar adenopathy present.    She has no cervical adenopathy.  Neurological: She is  alert and oriented to person, place, and time.  Skin: Skin is warm and dry. Capillary refill takes less than 2 seconds. She is not diaphoretic.  Psychiatric: She has a normal mood and affect.  Nursing note and vitals reviewed.   Urgent Care Course     Procedures (including critical care time)  Labs Review Labs Reviewed - No data to display  Imaging Review No results found.      MDM   1. Viral upper respiratory tract infection    Viral URI, given Tessalon, ipratropium, provided counseling on over-the-counter management of symptoms. Follow-up with primary care in 1 week, return to clinic as necessary.     Barnet Glasgow, NP 03/10/17 [id number]

## 2017-03-10 NOTE — Discharge Instructions (Signed)
You most likely have a viral URI, I advise rest, plenty of fluids and management of symptoms with over the counter medicines. For symptoms you may take Tylenol as needed every 4-6 hours for body aches or fever, not to exceed 4,000 mg a day, Take mucinex or mucinex DM ever 12 hours with a full glass of water, you may use an inhaled steroid such as Flonase, 2 sprays each nostril once a day for congestion, or an antihistamine such as Claritin or Zyrtec once a day. For cough, I have prescribed a medication called Tessalon. Take 1 tablet every 8 hours as needed for your cough. For runny nose and congestion, I have prescribed ipratropium nasal spray, due to sprays each nostril up to 4 times a day. Should your symptoms worsen or fail to resolve, follow up with your primary care provider or return to clinic.

## 2017-03-16 ENCOUNTER — Other Ambulatory Visit: Payer: Self-pay | Admitting: Family Medicine

## 2017-03-16 DIAGNOSIS — M159 Polyosteoarthritis, unspecified: Secondary | ICD-10-CM

## 2017-03-16 DIAGNOSIS — M8949 Other hypertrophic osteoarthropathy, multiple sites: Secondary | ICD-10-CM

## 2017-03-16 DIAGNOSIS — M150 Primary generalized (osteo)arthritis: Principal | ICD-10-CM

## 2017-05-27 ENCOUNTER — Other Ambulatory Visit: Payer: Self-pay | Admitting: *Deleted

## 2017-05-27 DIAGNOSIS — M8949 Other hypertrophic osteoarthropathy, multiple sites: Secondary | ICD-10-CM

## 2017-05-27 DIAGNOSIS — M159 Polyosteoarthritis, unspecified: Secondary | ICD-10-CM

## 2017-05-27 DIAGNOSIS — M150 Primary generalized (osteo)arthritis: Principal | ICD-10-CM

## 2017-05-27 MED ORDER — DICLOFENAC SODIUM 75 MG PO TBEC: DELAYED_RELEASE_TABLET | ORAL | 0 refills | Status: DC

## 2017-05-27 NOTE — Telephone Encounter (Signed)
Patient calling to request refill of:  Name of Medication(s):  Diclofenac Last date of OV: 11/22/2016 [facility] [address]  Patient states that her insurance runs out at the end of this month and needs a refill on this medication before it does. Will route refill request to Clinic RN.  Discussed with patient policy to call pharmacy for future refills.  Also, discussed refills may take up to 48 hours to approve or deny.  Jeffren Dombek,  Ashleen Demma

## 2017-08-15 ENCOUNTER — Other Ambulatory Visit: Payer: Self-pay | Admitting: Family Medicine

## 2017-08-15 DIAGNOSIS — Z1231 Encounter for screening mammogram for malignant neoplasm of breast: Secondary | ICD-10-CM

## 2017-09-01 ENCOUNTER — Ambulatory Visit: Payer: BLUE CROSS/BLUE SHIELD

## 2017-09-30 ENCOUNTER — Ambulatory Visit
Admission: RE | Admit: 2017-09-30 | Discharge: 2017-09-30 | Disposition: A | Discharge status: Home / Self Care | Payer: BLUE CROSS/BLUE SHIELD | Source: Ambulatory Visit | Attending: Family Medicine | Admitting: Family Medicine

## 2017-09-30 DIAGNOSIS — Z1231 Encounter for screening mammogram for malignant neoplasm of breast: Secondary | ICD-10-CM

## 2017-11-24 ENCOUNTER — Ambulatory Visit (INDEPENDENT_AMBULATORY_CARE_PROVIDER_SITE_OTHER): Payer: BLUE CROSS/BLUE SHIELD | Admitting: Family Medicine

## 2017-11-24 ENCOUNTER — Other Ambulatory Visit: Payer: Self-pay

## 2017-11-24 ENCOUNTER — Encounter: Payer: Self-pay | Admitting: Family Medicine

## 2017-11-24 VITALS — BP 104/58 | HR 82 | Temp 99.1°F | Ht 62.0 in | Wt 183.2 lb

## 2017-11-24 DIAGNOSIS — R7309 Other abnormal glucose: Secondary | ICD-10-CM | POA: Diagnosis not present

## 2017-11-24 DIAGNOSIS — M722 Plantar fascial fibromatosis: Secondary | ICD-10-CM | POA: Insufficient documentation

## 2017-11-24 DIAGNOSIS — I10 Essential (primary) hypertension: Secondary | ICD-10-CM

## 2017-11-24 DIAGNOSIS — M1990 Unspecified osteoarthritis, unspecified site: Secondary | ICD-10-CM | POA: Diagnosis not present

## 2017-11-24 DIAGNOSIS — Z720 Tobacco use: Secondary | ICD-10-CM | POA: Diagnosis not present

## 2017-11-24 LAB — POCT GLYCOSYLATED HEMOGLOBIN (HGB A1C): Hemoglobin A1C: 6

## 2017-11-24 MED ORDER — ATENOLOL-CHLORTHALIDONE 50-25 MG PO TABS: ORAL_TABLET | ORAL | 3 refills | Status: DC

## 2017-11-24 NOTE — Assessment & Plan Note (Signed)
Counseled patient extensively on prolonged NSAID use and risk of gastritis versus ulcers versus GI bleed.  She would like to stop Voltaren at this time, and reassess during acute flares for possible need for steroid injection or short course of NSAIDs.  Continue Tylenol and topical agents.  She uses a rice sock at home which is also helpful.

## 2017-11-24 NOTE — Progress Notes (Signed)
CC: ankle pain, med refills  HPI Ankle pain -pain over the medial aspect of the calcaneus in her right foot.  Reports that she was previously diagnosed with plantar fasciitis and given steroid injections at the podiatrist, which did not help her.  The pain is worse in the morning and at the end of her shift.  She [facility] on her feet [facility] during her working days.  Tries Tylenol for this.  Med refills -would like to combine her 2 blood pressure medicines into 1 pill due to cost.  Additionally, would like a refill on Voltaren.  HTN- been on meds for years. No symptoms of hypotension.   Smoking - 35 years, smokes 1ppd. Wants to quit, but not very motivated. Doesn't want to try chantix, friend with a bad experience.   Arthritis - takes voltaren. Hx of rotator cuff surgery on R shoulder (scope). Pain in bilateral arms and knees.  Tylenol seems to be providing enough pain relief at present.  Has occasionally tried Biofreeze topically on her knees with relief for a few hours.  ROS: Denies chest pain, shortness of breath, abdominal pain, dysuria, changes in bowel movement.  CC, SH/smoking status, and VS noted  Objective: BP (!) 104/58   Pulse 82   Temp 99.1 F (37.3 C) (Oral)   Ht 5\' 2"  (1.575 m)   Wt 183 lb 3.2 oz (83.1 kg)   SpO2 94%   BMI 33.51 kg/m  Gen: NAD, alert, cooperative, and pleasant. HEENT: NCAT, EOMI, PERRL CV: RRR, no murmur Resp: Coarse breath sounds throughout bilaterally, scattered end expiratory wheezes, non-labored. Ext: No edema, warm Neuro: Alert and oriented, Speech clear, No gross deficits  Assessment and plan:  Hypertension Continue medications at current doses, represcribed as combined tablet at patient request.  If this is expensive, she will call back and we will seek alternatives.  Arthritis Counseled patient extensively on prolonged NSAID use and risk of gastritis versus ulcers versus GI bleed.  She would like to stop Voltaren at this  time, and reassess during acute flares for possible need for steroid injection or short course of NSAIDs.  Continue Tylenol and topical agents.  She uses a rice sock at home which is also helpful.  Tobacco abuse Patient with greater than 35-pack-year history, and persistent cough along with hoarseness.  Concerned based on physical exam for possible COPD, which she reports she has not been diagnosed within the past.  Referred to Dr. Everitt Amber for formal PFTs.  Anticipate the need for inhaler therapy.  Would also consider low-dose screening CT in the future.  Patient reports that she quit smoking for 6 months several years ago after her birthday, because she was "bound and determined."  She thinks she may be ready to attempt cessation again around her birthday this year, which is in March.  She states that the nicotine patch helped her and completely alleviated her cravings, however she had trouble keeping it on due to sweaty skin.  Suggested that she return to discuss alternative nicotine delivery mechanisms versus discussing smoking cessation with Dr. Valentina Lucks at her upcoming appointment.   Plantar fasciitis of right foot Patient describes symptoms consistent with plantar fasciitis in her right foot.  Wears minimally supportive shoes with over-the-counter insoles to work.  Referred to sports medicine for possible heel lifts versus custom orthotics.  Given home therapy exercises.   Orders Placed This Encounter  Procedures  . Basic metabolic panel  . CBC  . Ambulatory referral to  Sports Medicine    Referral Priority:   Routine    Referral Type:   Consultation    Number of Visits Requested:   1  . POCT glycosylated hemoglobin (Hb A1C)    Meds ordered this encounter  Medications  . atenolol-chlorthalidone (TENORETIC) 50-25 MG tablet    Sig: TAKE ONE TABLET BY MOUTH ONCE DAILY    Dispense:  90 tablet    Refill:  3     Ralene Ok, MD, PGY2 11/24/2017 8:38 PM

## 2017-11-24 NOTE — Assessment & Plan Note (Addendum)
Patient with greater than 35-pack-year history, and persistent cough along with hoarseness.  Concerned based on physical exam for possible COPD, which she reports she has not been diagnosed within the past.  Referred to Dr. Everitt Amber for formal PFTs.  Anticipate the need for inhaler therapy.  Would also consider low-dose screening CT in the future.  Patient reports that she quit smoking for 6 months several years ago after her birthday, because she was "bound and determined."  She thinks she may be ready to attempt cessation again around her birthday this year, which is in March.  She states that the nicotine patch helped her and completely alleviated her cravings, however she had trouble keeping it on due to sweaty skin.  Suggested that she return to discuss alternative nicotine delivery mechanisms versus discussing smoking cessation with Dr. Valentina Lucks at her upcoming appointment.

## 2017-11-24 NOTE — Patient Instructions (Addendum)
It was a pleasure to see you today! Thank you for choosing [facility] for your primary care. Sonya Walton was seen for med refills, ankle pain, cough.   Our plans for today were:  Please make an appt (ask at the front desk) with Dr. Valentina Lucks for PFTs (lung testing) and smoking cessation.   I will send a referral to sports medicine.   Call if you have cost issues with the BP medicines.   Take tylenol for your arthritis pain, call if you would like [place] to see you and see if you need a shot or alternative medicine.    You should return to our clinic to see Dr. Lindell Noe in 3 months for blood pressure, smoking cessation.   Best,  Dr. Lindell Noe   Plantar Fasciitis Plantar fasciitis is a painful foot condition that affects the heel. It occurs when the band of tissue that connects the toes to the heel bone (plantar fascia) becomes irritated. This can happen after exercising too much or doing other repetitive activities (overuse injury). The pain from plantar fasciitis can range from mild irritation to severe pain that makes it difficult for you to walk or move. The pain is usually worse in the morning or after you have been sitting or lying down for a while. What are the causes? This condition may be caused by:  Standing for long periods of time.  Wearing shoes that do not fit.  Doing high-impact activities, including running, aerobics, and ballet.  Being overweight.  Having an abnormal way of walking (gait).  Having tight calf muscles.  Having high arches in your feet.  Starting a new athletic activity.  What are the signs or symptoms? The main symptom of this condition is heel pain. Other symptoms include:  Pain that gets worse after activity or exercise.  Pain that is worse in the morning or after resting.  Pain that goes away after you walk for a few minutes.  How is this diagnosed? This condition may be diagnosed based on your signs and symptoms. Your  health care provider will also do a physical exam to check for:  A tender area on the bottom of your foot.  A high arch in your foot.  Pain when you move your foot.  Difficulty moving your foot.  You may also need to have imaging studies to confirm the diagnosis. These can include:  X-rays.  Ultrasound.  MRI.  How is this treated? Treatment for plantar fasciitis depends on the severity of the condition. Your treatment may include:  Rest, ice, and over-the-counter pain medicines to manage your pain.  Exercises to stretch your calves and your plantar fascia.  A splint that holds your foot in a stretched, upward position while you sleep (night splint).  Physical therapy to relieve symptoms and prevent problems in the future.  Cortisone injections to relieve severe pain.  Extracorporeal shock wave therapy (ESWT) to stimulate damaged plantar fascia with electrical impulses. It is often used as a last resort before surgery.  Surgery, if other treatments have not worked after 12 months.  Follow these instructions at home:  Take medicines only as directed by your health care provider.  Avoid activities that cause pain.  Roll the bottom of your foot over a bag of ice or a bottle of cold water. Do this for 20 minutes, 3-4 times a day.  Perform simple stretches as directed by your health care provider.  Try wearing athletic shoes with air-sole or gel-sole  cushions or soft shoe inserts.  Wear a night splint while sleeping, if directed by your health care provider.  Keep all follow-up appointments with your health care provider. How is this prevented?  Do not perform exercises or activities that cause heel pain.  Consider finding low-impact activities if you continue to have problems.  Lose weight if you need to. The best way to prevent plantar fasciitis is to avoid the activities that aggravate your plantar fascia. Contact a health care provider if:  Your symptoms do  not go away after treatment with home care measures.  Your pain gets worse.  Your pain affects your ability to move or do your daily activities. This information is not intended to replace advice given to you by your health care provider. Make sure you discuss any questions you have with your health care provider. Document Released: 07/13/2001 Document Revised: 03/22/2016 Document Reviewed: 08/28/2014 Elsevier Interactive Patient Education  2018 [place].   Plantar Fasciitis Rehab Ask your health care provider which exercises are safe for you. Do exercises exactly as told by your health care provider and adjust them as directed. It is normal to feel mild stretching, pulling, tightness, or discomfort as you do these exercises, but you should stop right away if you feel sudden pain or your pain gets worse. Do not begin these exercises until told by your health care provider. Stretching and range of motion exercises These exercises warm up your muscles and joints and improve the movement and flexibility of your foot. These exercises also help to relieve pain. Exercise A: Plantar fascia stretch  1. Sit with your left / right leg crossed over your opposite knee. 2. Hold your heel with one hand with that thumb near your arch. With your other hand, hold your toes and gently pull them back toward the top of your foot. You should feel a stretch on the bottom of your toes or your foot or both. 3. Hold this stretch for____10__ seconds. 4. Slowly release your toes and return to the starting position. Repeat _____5___ times. Complete this exercise ___2_______ times a day. Exercise B: Gastroc, standing  1. Stand with your hands against a wall. 2. Extend your left / right leg behind you, and bend your front knee slightly. 3. Keeping your heels on the floor and keeping your back knee straight, shift your weight toward the wall without arching your back. You should feel a gentle stretch in your left /  right calf. 4. Hold this position for _______10___ seconds. Repeat ____5______ times. Complete this exercise ______2___ times a day. Exercise C: Soleus, standing 1. Stand with your hands against a wall. 2. Extend your left / right leg behind you, and bend your front knee slightly. 3. Keeping your heels on the floor, bend your back knee and slightly shift your weight over the back leg. You should feel a gentle stretch deep in your calf. 4. Hold this position for _______10___ seconds. Repeat ___3_______ times. Complete this exercise __2________ times a day. Exercise D: Gastrocsoleus, standing 1. Stand with the ball of your left / right foot on a step. The ball of your foot is on the walking surface, right under your toes. 2. Keep your other foot firmly on the same step. 3. Hold onto the wall or a railing for balance. 4. Slowly lift your other foot, allowing your body weight to press your heel down over the edge of the step. You should feel a stretch in your left / right calf.  5. Hold this position for ______10____ seconds. 6. Return both feet to the step. 7. Repeat this exercise with a slight bend in your left / right knee. Repeat ____2______ times with your left / right knee straight and ______2____ times with your left / right knee bent. Complete this exercise ______2____ times a day. Balance exercise This exercise builds your balance and strength control of your arch to help take pressure off your plantar fascia. Exercise E: Single leg stand 1. Without shoes, stand near a railing or in a doorway. You may hold onto the railing or door frame as needed. 2. Stand on your left / right foot. Keep your big toe down on the floor and try to keep your arch lifted. Do not let your foot roll inward. 3. Hold this position for _____10_____ seconds. 4. If this exercise is too easy, you can try it with your eyes closed or while standing on a pillow. Repeat _____5_____ times. Complete this exercise  _______2___ times a day. This information is not intended to replace advice given to you by your health care provider. Make sure you discuss any questions you have with your health care provider. Document Released: 10/18/2005 Document Revised: 06/22/2016 Document Reviewed: 09/01/2015 Elsevier Interactive Patient Education  2018 [employer].

## 2017-11-24 NOTE — Assessment & Plan Note (Signed)
Patient describes symptoms consistent with plantar fasciitis in her right foot.  Wears minimally supportive shoes with over-the-counter insoles to work.  Referred to sports medicine for possible heel lifts versus custom orthotics.  Given home therapy exercises.

## 2017-11-24 NOTE — Assessment & Plan Note (Signed)
Continue medications at current doses, represcribed as combined tablet at patient request.  If this is expensive, she will call back and we will seek alternatives.

## 2017-11-25 LAB — CBC
Hematocrit: 45.8 % (ref 34.0–46.6)
Hemoglobin: 15.4 g/dL (ref 11.1–15.9)
MCH: 29.8 pg (ref 26.6–33.0)
MCHC: 33.6 g/dL (ref 31.5–35.7)
MCV: 89 fL (ref 79–97)
Platelets: 330 10*3/uL (ref 150–379)
RBC: 5.16 x10E6/uL (ref 3.77–5.28)
RDW: 14.7 % (ref 12.3–15.4)
WBC: 9 10*3/uL (ref 3.4–10.8)

## 2017-11-25 LAB — BASIC METABOLIC PANEL
BUN/Creatinine Ratio: 18 (ref 9–23)
BUN: 13 mg/dL (ref 6–24)
CO2: 27 mmol/L (ref 20–29)
Calcium: 10 mg/dL (ref 8.7–10.2)
Chloride: 103 mmol/L (ref 96–106)
Creatinine, Ser: 0.71 mg/dL (ref 0.57–1.00)
GFR calc Af Amer: 109 mL/min/{1.73_m2} (ref >59)
GFR calc non Af Amer: 95 mL/min/{1.73_m2} (ref >59)
Glucose: 91 mg/dL (ref 65–99)
Potassium: 3.5 mmol/L (ref 3.5–5.2)
Sodium: 145 mmol/L — ABNORMAL HIGH (ref 134–144)

## 2017-11-28 ENCOUNTER — Ambulatory Visit: Payer: BLUE CROSS/BLUE SHIELD | Admitting: Sports Medicine

## 2017-11-28 ENCOUNTER — Encounter: Payer: Self-pay | Admitting: Sports Medicine

## 2017-11-28 VITALS — BP 114/72 | Ht 62.0 in | Wt 183.0 lb

## 2017-11-28 DIAGNOSIS — M25579 Pain in unspecified ankle and joints of unspecified foot: Secondary | ICD-10-CM

## 2017-11-28 NOTE — Progress Notes (Signed)
   CC: Right foot pain.  HPI:  Ms.Sonya Walton is a 57 y.o. came to [place] with complaint of right foot pain for little over 2 weeks.  Patient started right foot pain around the medial malleolus little over 2 weeks ago.  She cannot remember any inciting event.  She does notice intermittent swelling, she was raising her leg to help with the swelling and stating that her swelling is better than before.  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  According to her her pain and swelling get worse at the end of the day.  She denies any pain while resting at night.  She occasionally experience mild stiffness in the morning, otherwise remained pretty asymptomatic after resting.  Occasionally her pain radiates to the right heel.  She was using Tylenol with minimum relief.  According to patient she cannot use NSAID as it makes her stomach upset.  Patient has an history of similar symptoms a few years ago, when she was treated with some injection, according to her that helped just for 2 weeks, but her symptoms resolved on its own after some time.  She also has an history of surgeries in that foot, one for a spur removal and the other for bunion in 1994.  Past Medical History:  Diagnosis Date  . Arthritis   . Hoarse voice quality 01/19/2016  . Hypertension    states under control with med., has been on med. since 2011  . Immature cataract   . 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 Partial tear of right rotator cuff 12/2015  . Wears dentures    upper   Review of Systems: Negative except mentioned in HPI.  Physical Exam:  Vitals:   11/28/17 1132  BP: 114/72  Weight: 183 lb (83 kg)  Height: 5\' 2"  (1.575 m)   Gen. well-developed, well-nourished lady, in no acute distress. Musculoskeletal.  Mild to moderate edema around right medial malleolus, marked tenderness at bony prominence of the medial malleolus.  Right foot [place].  Limited ultrasound  was performed.  Fluid collection around anterior tibialis tendon.  A big spur on medial malleolus.    Assessment & Plan:   Medial malleolus stress reaction.  Her symptoms and exam was more consistent with stress reaction. -She was provided with Cam boot, which she will need to wear at during the day whenever on her feet. -She was asked to use ice twice daily, as NSAIDs use make her stomach upset. -She can continue using Tylenol. -Reevaluate in 2 weeks.  Patient seen and evaluated with the resident. I agree with the above plan of care. Patient's point of maximum tenderness is directly over the medial malleolus. She has some mild swelling here as well. Ultrasound shows some soft tissue swelling in this area and some irregularity consistent with probable degenerative changes although a stress reaction cannot be ruled out. We will proceed with treatment as above and she will follow-up with me in 2 weeks. If symptoms improve then we will get her into some green sports insoles with scaphoid pads. If pain persists then I would consider further diagnostic imaging initially in the form of x-ray and possibly proceeding with MRI thereafter. Patient will call with questions or concerns prior to her follow-up visit.

## 2017-11-28 NOTE — Patient Instructions (Signed)
  I think you may have a small stress reaction in one of the bones in your ankle  Wear the walking boot for 2 weeks but do not sleep in it and you cannot wear when driving  Put ice on your ankle 2-3 times a day for 10 minutes at a time  Follow-up with me in 2 weeks. If no better, I'm going to get an x-ray. If your pain has improved we will put you in insert

## 2017-12-09 ENCOUNTER — Telehealth: Payer: Self-pay

## 2017-12-09 NOTE — Telephone Encounter (Signed)
Patient left message on nurse line on 12/08/17 stating she was returning a call, possibly about her lab work. Unable to locate a message in chart. Danley Danker, RN [facility] RN)

## 2017-12-12 ENCOUNTER — Encounter: Payer: Self-pay | Admitting: Pharmacist

## 2017-12-12 ENCOUNTER — Ambulatory Visit: Payer: BLUE CROSS/BLUE SHIELD | Admitting: Pharmacist

## 2017-12-12 ENCOUNTER — Encounter: Payer: Self-pay | Admitting: Sports Medicine

## 2017-12-12 ENCOUNTER — Ambulatory Visit: Payer: BLUE CROSS/BLUE SHIELD | Admitting: Sports Medicine

## 2017-12-12 ENCOUNTER — Ambulatory Visit
Admission: RE | Admit: 2017-12-12 | Discharge: 2017-12-12 | Disposition: A | Discharge status: Home / Self Care | Payer: BLUE CROSS/BLUE SHIELD | Source: Ambulatory Visit | Attending: Sports Medicine | Admitting: Sports Medicine

## 2017-12-12 VITALS — BP 114/66 | HR 66 | Ht 62.0 in | Wt 183.0 lb

## 2017-12-12 DIAGNOSIS — Z720 Tobacco use: Secondary | ICD-10-CM | POA: Diagnosis not present

## 2017-12-12 DIAGNOSIS — M25571 Pain in right ankle and joints of right foot: Secondary | ICD-10-CM

## 2017-12-12 MED ORDER — BUPROPION HCL ER (XL) 150 MG PO TB24: 150.0000 mg | ORAL_TABLET | Freq: Every day | ORAL | 2 refills | Status: DC

## 2017-12-12 NOTE — Progress Notes (Signed)
Patient ID: Sonya Walton, female   DOB: 03/13/1961, 57 y.o.   MRN: [id number] Reviewed: agree with Dr. Graylin Shiver documentation and management.

## 2017-12-12 NOTE — Patient Instructions (Signed)
Go get X-ray of right ankle after your appointment today. Try to transition from boot to shoes with green inserts as pain allows. You may need to wear the boot for part of the day until you are used to the inserts.  Follow up in clinic in 2 weeks for reevaluation.  If you are not continuing to get better, we may get an MRI to rule out a stress fracture in your ankle.

## 2017-12-12 NOTE — Patient Instructions (Addendum)
1.) Start bupropion XL 150 mg once daily, beginning today.   2.) Return to pharmacy clinic in about one month, right before your birthday.  3.) Will begin nicotine patches at next pharmacy clinic visit.

## 2017-12-12 NOTE — Progress Notes (Signed)
   S:  Patient arrives in good spirits ambulating without assistance. Patient arrives for evaluation/assistance with tobacco dependence.  Patient was referred on 11/24/17 by Primary Care Provider Dr. Lindell Noe. Patient with 35 pack year history reports smoking began at age 57. Notes previous success for 6 months using nicotine patches (well tolerated) which helped alleviate cravings but complained of patches not sticking when perspiring.   Endorses using twizzlers to keep her hands busy. Reports smoking relapsed during a beach trip with daughter who also smokes. Patient reports high stress currently with relocating mother and father to assisted living. Endorses wanting to quit but unsure if ready today based on current familial circumstances. Reports quitting on her birthday was helpful in previous quit attempt.   Number of Cigarettes per day 1 pack per day. Brand smoked Marlboro Light 100s.  Smokes first cigarette 30 minutes after waking. Denies waking to smoke / Smokes times per night.     Longest time ever been tobacco free: 6 months.  Medications (NRT, bupropion, varenicline) used in prior in past cessation efforts include: Nicotine patches   Most common triggers to use tobacco include; stress, after meals  A/P: Moderate Nicotine Dependence of 35 years duration in a patient who is fair candidate for success b/c of moderate dependence, previous quit history, and hx of response to NRT. Initiated bupropion XL tx 150 mg daily. Denies history of seizures. Patient counseled on purpose, proper use, and potential adverse effects, including insomnia, and potential change in mood. Patient states goal of reducing from 1 pack to 3/4 pack a day by next Rx visit. Initiate nicotine patch 14 or 21 mg depending on nicotine intake at next Rx clinic visit. Established quit date of 01/09/18 (patient birthday). Consider getting baseline PFTs post quit date.   Written information provided.  F/U Rx Clinic Visit in  3-4 weeks. Total time in face-to-face counseling 35 minutes. Patient seen with Onnie Boer, PharmD Candidate and Leroy Libman, PharmD, Dennie Fetters, MD.

## 2017-12-12 NOTE — Assessment & Plan Note (Signed)
Moderate Nicotine Dependence of 35 years duration in a patient who is fair candidate for success b/c of moderate dependence, previous quit history, and hx of response to NRT. Initiated bupropion XL tx 150 mg daily. Denies history of seizures. Patient counseled on purpose, proper use, and potential adverse effects, including insomnia, and potential change in mood. Patient states goal of reducing from 1 pack to 3/4 pack a day by next Rx visit. Initiate nicotine patch 14 or 21 mg depending on nicotine intake at next Rx clinic visit. Established quit date of 01/09/18 (patient birthday). Consider getting baseline PFTs post quit date.

## 2017-12-12 NOTE — Progress Notes (Signed)
   Subjective:    Patient ID: Sonya Walton, female    DOB: 23-Sep-1961, 57 y.o.   MRN: [id number]  HPI Sonya Walton is a 57 year old female who presents for follow up of her right ankle pain. She was evaluated 2 weeks ago and diagnosed with a possible stress reaction of her medial malleolus at that time. She also has a significant bone spur present at the medial malleolus. She was placed in a CAM boot. On presentation today she is feeling improved compared to 2 weeks ago, but still has some pain, especially when she is not in the boot. She denies any swelling or erythema of the ankle. Denies numbness or tingling. No new symptoms at this time.    Review of Systems Negative except as stated above    Objective:   Physical Exam General: No acute distress Right ankle: No swelling or erythema. Full ROM with mild pain. Tender to palpation of medial malleolus, no other foot or ankle tenderness.       Assessment & Plan:  57 year old female presents for follow up of right medial malleolus pain. Symptoms have improved with CAM boot but not resolved. Given her remaining tenderness to direct palpation, an XR will be obtained today. She will also be given green sports insoles with scaphoid pads today, and is instructed that she can start to transition from her boot to shoes with inserts as long as she does not have increased pain. She will follow up in clinic in 2 weeks. If she does not continue to progress, will consider MRI to further evaluate for possible stress fracture. Call with x-ray results when available.

## 2017-12-14 ENCOUNTER — Telehealth: Payer: Self-pay

## 2017-12-14 NOTE — Telephone Encounter (Signed)
Patient left message on nurse line stating she has not received lab results from 11/24/17. Danley Danker, RN [facility] RN)

## 2017-12-15 ENCOUNTER — Other Ambulatory Visit: Payer: Self-pay

## 2017-12-15 ENCOUNTER — Telehealth: Payer: Self-pay | Admitting: Sports Medicine

## 2017-12-15 DIAGNOSIS — M25571 Pain in right ankle and joints of right foot: Secondary | ICD-10-CM

## 2017-12-15 NOTE — Telephone Encounter (Signed)
  I tried multiple times to reach this patient via telephone to discuss x-ray findings of her right ankle. She has a rather large bony fragment adjacent to the medial malleolus which may be her pain generator. I would like to refer the patient to Dr. Sharol Given for his input. I will continue to have my office try to contact the patient. She does have a follow-up scheduled for 2 weeks from now.

## 2017-12-15 NOTE — Telephone Encounter (Signed)
Called patient, left VM that labs were all normal.

## 2017-12-26 ENCOUNTER — Ambulatory Visit (INDEPENDENT_AMBULATORY_CARE_PROVIDER_SITE_OTHER): Payer: BLUE CROSS/BLUE SHIELD | Admitting: Sports Medicine

## 2017-12-26 ENCOUNTER — Encounter (INDEPENDENT_AMBULATORY_CARE_PROVIDER_SITE_OTHER): Payer: Self-pay | Admitting: Orthopedic Surgery

## 2017-12-26 ENCOUNTER — Ambulatory Visit (INDEPENDENT_AMBULATORY_CARE_PROVIDER_SITE_OTHER): Payer: BLUE CROSS/BLUE SHIELD | Admitting: Orthopedic Surgery

## 2017-12-26 ENCOUNTER — Ambulatory Visit (INDEPENDENT_AMBULATORY_CARE_PROVIDER_SITE_OTHER): Payer: Self-pay | Admitting: Orthopedic Surgery

## 2017-12-26 VITALS — Ht 62.0 in | Wt 183.0 lb

## 2017-12-26 DIAGNOSIS — M24071 Loose body in right ankle: Secondary | ICD-10-CM | POA: Diagnosis not present

## 2017-12-26 DIAGNOSIS — M25871 Other specified joint disorders, right ankle and foot: Secondary | ICD-10-CM | POA: Diagnosis not present

## 2017-12-26 DIAGNOSIS — M25571 Pain in right ankle and joints of right foot: Secondary | ICD-10-CM

## 2017-12-26 MED ORDER — LIDOCAINE HCL 1 % IJ SOLN
2.0000 mL | INTRAMUSCULAR | Status: AC | PRN
  Administered 2017-12-26: 2 mL

## 2017-12-26 MED ORDER — METHYLPREDNISOLONE ACETATE 40 MG/ML IJ SUSP
40.0000 mg | INTRAMUSCULAR | Status: AC | PRN
  Administered 2017-12-26: 40 mg via INTRA_ARTICULAR

## 2017-12-26 NOTE — Progress Notes (Signed)
Office Visit Note   Patient: Sonya Walton           Date of Birth: [birth year]           MRN: [id number] Visit Date: 12/26/2017              Requested by: Sela Hilding, MD [address] [place], [place] [address] PCP: Sela Hilding, MD  Chief Complaint  Patient presents with  . Right Ankle - Pain    Discuss possible surgery for large bony fragment       HPI: Patient is a 57 year old woman who complains of acute pain in her right ankle medial malleolus.  Patient denies any specific trauma that she can remember.  She states her pain is primarily over the medial aspect of the ankle joint.  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  She has a over 30-year history of tobacco use.  The importance of smoking cessation was discussed.  Assessment & Plan: Visit Diagnoses:  1. Impingement of right ankle joint   2. Loose body in right ankle     Plan: The right ankle was injected from the anterior medial portal will reevaluate at follow-up in 3 weeks.  Depending on the results from the injection will determine our care patient may require arthroscopic debridement of the ankle joint may require excision of the medial malleolar fragment nonunion.  Follow-Up Instructions: Return in about 3 weeks (around 01/16/2018).   Ortho Exam  Patient is alert, oriented, no adenopathy, well-dressed, normal affect, normal respiratory effort. Examination patient is a good dorsalis pedis pulse the peroneal and posterior tibial tendon are nontender to palpation.  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  The bone is well corticated and a chronic finding.  Imaging: No results found. No images are attached to the encounter.  Labs: Lab Results  Component Value Date   HGBA1C 6.0 11/24/2017   HGBA1C 6.1  04/12/2016   HGBA1C 5.6 08/17/2013    @LABSALLVALUES (HGBA1)@  Body mass index is 33.47 kg/m.  Orders:  Orders Placed This Encounter  Procedures  . Medium Joint Inj   No orders of the defined types were placed in this encounter.    Procedures: Medium Joint Inj: R ankle on 12/26/2017 2:32 PM Indications: pain and diagnostic evaluation Details: 22 G 1.5 in needle, anteromedial approach Medications: 2 mL lidocaine 1 %; 40 mg methylPREDNISolone acetate 40 MG/ML Outcome: tolerated well, no immediate complications Procedure, treatment alternatives, risks and benefits explained, specific risks discussed. Consent was given by the patient. Immediately prior to procedure a time out was called to verify the correct patient, procedure, equipment, support staff and site/side marked as required. Patient was prepped and draped in the usual sterile fashion.      Clinical Data: No additional findings.  ROS:  All other systems negative, except as noted in the HPI. Review of Systems  Objective: Vital Signs: Ht 5\' 2"  (1.575 m)   Wt 183 lb (83 kg)   BMI 33.47 kg/m   Specialty Comments:  No specialty comments available.  PMFS History: Patient Active Problem List   Diagnosis Date Noted  . Plantar fasciitis of right foot 11/24/2017  . Skin pustule 11/24/2016  . Pain in joint, shoulder region 04/12/2016  . Prediabetes 04/12/2016  . Rotator cuff arthropathy 12/30/2015  . Partial  tear of right rotator cuff 05/02/2015  . Low back pain 04/06/2015  . Hypertension 10/05/2012  . Tobacco abuse 12/31/2011  . Arthritis 12/31/2011   Past Medical History:  Diagnosis Date  . Arthritis   . Hoarse voice quality 01/19/2016  . Hypertension    states under control with med., has been on med. since 2011  . Immature cataract   . 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 Partial tear of right rotator cuff 12/2015  . Wears dentures    upper    Family History  Problem Relation Age of Onset  .  Liver cancer Father   . Cancer Father        lung and liver  . Heart disease Mother   . Hyperlipidemia Mother   . Hypertension Mother   . Heart failure Mother   . Hypertension Other   . Diabetes Other   . Asthma Other   . Diabetes type II Sister   . Diabetes type II Brother   . Cancer Paternal Aunt        lung  . Asthma Sister   . Diabetes Sister   . Hypertension Sister   . Early death Sister        premature twins   . Hypertension Brother   . Diabetes Brother   . Asthma Brother   . Cancer Unknown        lung  . Breast cancer Sister 100  . COPD Sister     Past Surgical History:  Procedure Laterality Date  . BREAST BIOPSY Left   . BUNIONECTOMY Bilateral 1994  . CHOLECYSTECTOMY  01/17/2012   Procedure: LAPAROSCOPIC CHOLECYSTECTOMY;  Surgeon: Harl Bowie, MD;  Location: [facility];  Service: General;  Laterality: N/A;  . PUBOVAGINAL SLING  03/05/2003  . SHOULDER ARTHROSCOPY WITH DISTAL CLAVICLE RESECTION Right 01/26/2016   Procedure: SHOULDER ARTHROSCOPY WITH DISTAL CLAVICLE RESECTION;  Surgeon: Tania Ade, MD;  Location: [facility];  Service: Orthopedics;  Laterality: Right;  . SHOULDER ARTHROSCOPY WITH SUBACROMIAL DECOMPRESSION Right 01/26/2016   Procedure: SHOULDER ARTHROSCOPY WITH SUBACROMIAL DECOMPRESSION DEBRIDEMENT;  Surgeon: Tania Ade, MD;  Location: [facility];  Service: Orthopedics;  Laterality: Right;  . TONSILLECTOMY  1975  . TOTAL VAGINAL HYSTERECTOMY  03/05/2003  . TUBAL LIGATION  1981   Social History   Occupational History  . Not on file  Tobacco Use  . Smoking status: Current Every Day Smoker    Packs/day: 1.00    Years: 34.00    Pack years: 34.00    Types: Cigarettes  . Smokeless tobacco: Never Used  Substance and Sexual Activity  . Alcohol use: Yes    Alcohol/week: 0.0 oz    Comment: rarely  . Drug use: No  . Sexual activity: Yes    Partners: Male

## 2017-12-26 NOTE — Progress Notes (Signed)
Patient stopped in briefly today. I reviewed her x-ray with her. She has an appointment with Dr. Sharol Given later this afternoon. She has a rather sizable bony fragment just distal to the medial malleolus. It may need to be surgically excised. She is continuing to have pain and swelling. She will see Dr. Sharol Given as scheduled with further workup and treatment per his discretion.

## 2018-01-02 ENCOUNTER — Encounter: Payer: Self-pay | Admitting: Pharmacist

## 2018-01-02 ENCOUNTER — Ambulatory Visit: Payer: BLUE CROSS/BLUE SHIELD | Admitting: Pharmacist

## 2018-01-02 DIAGNOSIS — Z720 Tobacco use: Secondary | ICD-10-CM | POA: Diagnosis not present

## 2018-01-02 MED ORDER — NICOTINE 21 MG/24HR TD PT24: 21.0000 mg | MEDICATED_PATCH | Freq: Every day | TRANSDERMAL | 1 refills | Status: DC

## 2018-01-02 NOTE — Progress Notes (Signed)
   S:  Patient arrives in good spirits, ambulating without assistance.   Patient arrives for evaluation/assistance with tobacco dependence.  Patient was referred by Dr. Lindell Noe following PCP visit 11/24/2017.   Age when started using tobacco on a daily basis 89 yoa. Number of Cigarettes per day 20 (similar to previous visit). Estimated Nicotine Content per Cigarette (mg) 1.  Estimated Nicotine intake per day 20mg .   Smokes first cigarette 20-30 minutes after waking. Denies waking to smoke. Estimated Fagerstrom Score 4-5/10.   Past quit attempt using patches was successful for ~ 6 months.   Medications (NRT, bupropion, varenicline) used in prior in past cessation efforts include: patch - successful.   Rates IMPORTANCE of quitting tobacco on 1-10 scale of 10. Rates CONFIDENCE of quitting tobacco on 1-10 scale of 10.  Most common triggers to use tobacco include; habit, with coffee, stress.    Motivation to quit: health   A/P: moderate Nicotine Dependence of 35 years duration in a patient who is good candidate for success b/c of previous success with prescribed therapy and current level of motivation.  Initiated nicotine replacement - patch 21mg  daily. Patient counseled on purpose, proper use, and potential adverse effects, including topic irritation.  Continue bupropion tx at 150mg  Denies history of seizures. Patient counseled on purpose, proper use, and potential adverse effects, denies insomnia.  Quit date and patch start date is 1 week - on patient's birthday. 01/09/2018.  Plan phone call follow up at that time.  Written information provided. Provided information on 1 800-QUIT NOW support program.    F/U phone call 3/12.  F/U Rx Clinic Visit mid-later April when I plan to perform PFTs.   Total time in face-to-face counseling 35 minutes.

## 2018-01-02 NOTE — Assessment & Plan Note (Signed)
moderate Nicotine Dependence of 35 years duration in a patient who is good candidate for success b/c of previous success with prescribed therapy and current level of motivation.  Initiated nicotine replacement - patch 21mg  daily. Patient counseled on purpose, proper use, and potential adverse effects, including topic irritation.  Continue bupropion tx at 150mg  Denies history of seizures. Patient counseled on purpose, proper use, and potential adverse effects, denies insomnia.  Quit date and patch start date is 1 week - on patient's birthday. 01/09/2019.  Plan phone call follow up at that time.  Written information provided. Provided information on 1 800-QUIT NOW support program.

## 2018-01-02 NOTE — Patient Instructions (Signed)
Great seeing you again today.   Continue bupropion 150mg  once daily in the morning.   Start Nicotine patch 21mg  once daily on 3/11 - Happy Birthday!  I will plan to call you next week.   Plan to return to pharmacy clinic for lung function test in mid-later April.

## 2018-01-11 ENCOUNTER — Telehealth: Payer: Self-pay | Admitting: Pharmacist

## 2018-01-11 NOTE — Telephone Encounter (Signed)
Patient retuned call after I had left her a voice mail earlier today requesting call back.  She shared that she started patches on 3/12 (yesterday) the day after her birthday and she reports complete abstinence from smoking since.  She denies side effects from the patch.    She remains confident in her potential to stay quit long-term.  Options for dealing with her most difficult trigger, drinking coffee in the AM, were discussed. Encouraged continued abstinence.  She welcomed a call back in 1 week.

## 2018-01-16 ENCOUNTER — Encounter (INDEPENDENT_AMBULATORY_CARE_PROVIDER_SITE_OTHER): Payer: Self-pay | Admitting: Orthopedic Surgery

## 2018-01-16 ENCOUNTER — Ambulatory Visit (INDEPENDENT_AMBULATORY_CARE_PROVIDER_SITE_OTHER): Payer: BLUE CROSS/BLUE SHIELD | Admitting: Orthopedic Surgery

## 2018-01-16 VITALS — Ht 62.0 in | Wt 186.0 lb

## 2018-01-16 DIAGNOSIS — M25871 Other specified joint disorders, right ankle and foot: Secondary | ICD-10-CM | POA: Diagnosis not present

## 2018-01-16 NOTE — Progress Notes (Signed)
Office Visit Note   Patient: Sonya Walton           Date of Birth: 12/31/1960           MRN: [id number] Visit Date: 01/16/2018              Requested by: Sela Hilding, MD [address] [place], [place] [address] PCP: Sela Hilding, MD  Chief Complaint  Patient presents with  . Right Ankle - Pain    S/p cortisone injection 12/26/17.      HPI: Patient is a 57 year old woman who presents follow-up status post steroid injection for her right ankle for impingement symptoms.  Patient states she is doing a lot better.  She states she had a few days of increased pain and then things got better.  Assessment & Plan: Visit Diagnoses:  1. Impingement of right ankle joint     Plan: Recommend continued with conservative care discussed that we could proceed with up to a total of 3 injections into the right ankle.  Discussed that if she does not obtain sufficient relief with the steroid injections we could consider arthroscopic debridement as an outpatient surgery.  Follow-Up Instructions: Return if symptoms worsen or fail to improve.   Ortho Exam  Patient is alert, oriented, no adenopathy, well-dressed, normal affect, normal respiratory effort. Examination patient has a normal gait.  She has good pulses there is no pain with passive range of motion of the ankle there is no redness.  The skin wrinkles well no swelling.  Imaging: No results found. No images are attached to the encounter.  Labs: Lab Results  Component Value Date   HGBA1C 6.0 11/24/2017   HGBA1C 6.1 04/12/2016   HGBA1C 5.6 08/17/2013    @LABSALLVALUES (HGBA1)@  Body mass index is 34.02 kg/m.  Orders:  No orders of the defined types were placed in this encounter.  No orders of the defined types were placed in this encounter.    Procedures: No procedures performed  Clinical Data: No additional findings.  ROS:  All other systems negative, except as noted in the HPI. Review of  Systems  Objective: Vital Signs: Ht 5\' 2"  (1.575 m)   Wt 186 lb (84.4 kg)   BMI 34.02 kg/m   Specialty Comments:  No specialty comments available.  PMFS History: Patient Active Problem List   Diagnosis Date Noted  . Plantar fasciitis of right foot 11/24/2017  . Skin pustule 11/24/2016  . Pain in joint, shoulder region 04/12/2016  . Prediabetes 04/12/2016  . Rotator cuff arthropathy 12/30/2015  . Partial tear of right rotator cuff 05/02/2015  . Low back pain 04/06/2015  . Hypertension 10/05/2012  . Tobacco abuse 12/31/2011  . Arthritis 12/31/2011   Past Medical History:  Diagnosis Date  . Arthritis   . Hoarse voice quality 01/19/2016  . Hypertension    states under control with med., has been on med. since 2011  . Immature cataract   . 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 Partial tear of right rotator cuff 12/2015  . Wears dentures    upper    Family History  Problem Relation Age of Onset  . Liver cancer Father   . Cancer Father        lung and liver  . Heart disease Mother   . Hyperlipidemia Mother   . Hypertension Mother   . Heart failure Mother   . Hypertension Other   . Diabetes Other   . Asthma Other   .  Diabetes type II Sister   . Diabetes type II Brother   . Cancer Paternal Aunt        lung  . Asthma Sister   . Diabetes Sister   . Hypertension Sister   . Early death Sister        premature twins   . Hypertension Brother   . Diabetes Brother   . Asthma Brother   . Cancer Unknown        lung  . Breast cancer Sister 69  . COPD Sister     Past Surgical History:  Procedure Laterality Date  . BREAST BIOPSY Left   . BUNIONECTOMY Bilateral 1994  . CHOLECYSTECTOMY  01/17/2012   Procedure: LAPAROSCOPIC CHOLECYSTECTOMY;  Surgeon: Harl Bowie, MD;  Location: [facility];  Service: General;  Laterality: N/A;  . PUBOVAGINAL SLING  03/05/2003  . SHOULDER ARTHROSCOPY WITH DISTAL CLAVICLE RESECTION Right 01/26/2016   Procedure: SHOULDER ARTHROSCOPY WITH  DISTAL CLAVICLE RESECTION;  Surgeon: Tania Ade, MD;  Location: [facility];  Service: Orthopedics;  Laterality: Right;  . SHOULDER ARTHROSCOPY WITH SUBACROMIAL DECOMPRESSION Right 01/26/2016   Procedure: SHOULDER ARTHROSCOPY WITH SUBACROMIAL DECOMPRESSION DEBRIDEMENT;  Surgeon: Tania Ade, MD;  Location: [facility];  Service: Orthopedics;  Laterality: Right;  . TONSILLECTOMY  1975  . TOTAL VAGINAL HYSTERECTOMY  03/05/2003  . TUBAL LIGATION  1981   Social History   Occupational History  . Not on file  Tobacco Use  . Smoking status: Current Every Day Smoker    Packs/day: 1.00    Years: 34.00    Pack years: 34.00    Types: Cigarettes    Start date: 11/01/1981  . Smokeless tobacco: Never Used  . Tobacco comment: 1 ppd max use.   Substance and Sexual Activity  . Alcohol use: Yes    Alcohol/week: 0.0 oz    Comment: rarely  . Drug use: No  . Sexual activity: Yes    Partners: Male

## 2018-01-18 ENCOUNTER — Telehealth: Payer: Self-pay | Admitting: Pharmacist

## 2018-01-18 NOTE — Telephone Encounter (Signed)
Contacted in f/U tobacco cessation.  Reports complete abstinence from tobacco since 3/12.  Encouraged her to continue plan.  Follow up in office planned for 2 weeks in Rx Clinic.   Patient willing to continue current therapy.  Discussed staying on current therapy until her cravings are minimal. Still has daily cravings around the time of coffee, work breaks and lunch.   F/U planned in ~ 2 weeks. Patient agreed with plan and appreciated follow-up.

## 2018-02-14 ENCOUNTER — Ambulatory Visit: Payer: BLUE CROSS/BLUE SHIELD | Admitting: Internal Medicine

## 2018-02-20 ENCOUNTER — Other Ambulatory Visit: Payer: Self-pay | Admitting: Family Medicine

## 2018-02-20 DIAGNOSIS — Z720 Tobacco use: Secondary | ICD-10-CM

## 2018-02-21 ENCOUNTER — Telehealth: Payer: Self-pay | Admitting: Pharmacist

## 2018-02-21 MED ORDER — NICOTINE 14 MG/24HR TD PT24: 14.0000 mg | MEDICATED_PATCH | Freq: Every day | TRANSDERMAL | 0 refills | Status: DC

## 2018-02-21 NOTE — Telephone Encounter (Signed)
Sent in new script for NRT patch 14 mg (Step 2) to pharmacy per patient request.  Called patient to f/u on smoking cessation therapy however was unable to reach her. Left VM detailing a new script was sent to her pharmacy and recommended for her to call back to the clinic if she has any questions or concerns.

## 2018-03-11 ENCOUNTER — Other Ambulatory Visit: Payer: Self-pay | Admitting: Family Medicine

## 2018-03-11 DIAGNOSIS — Z720 Tobacco use: Secondary | ICD-10-CM

## 2018-03-13 NOTE — Telephone Encounter (Signed)
Called patient to check on smoking cessation efforts as buproprion refill was requested. No answer, left generic VM to call us back. Will refill.

## 2018-05-24 ENCOUNTER — Telehealth: Payer: Self-pay | Admitting: Family Medicine

## 2018-05-24 NOTE — Telephone Encounter (Signed)
Routing to PCP to see if by chance they may have tried to contact pt. It may be the automated system reminding her that she is due for a colonoscopy.

## 2018-05-24 NOTE — Telephone Encounter (Signed)
Pt called and said she missed a call from our office number. I did not see any encounters of anyone calling or upcoming appointments. I told her I would note that she was returning a call.

## 2018-05-24 NOTE — Telephone Encounter (Signed)
I did not call. 

## 2018-06-19 ENCOUNTER — Encounter (INDEPENDENT_AMBULATORY_CARE_PROVIDER_SITE_OTHER): Payer: Self-pay | Admitting: Orthopedic Surgery

## 2018-06-19 ENCOUNTER — Ambulatory Visit (INDEPENDENT_AMBULATORY_CARE_PROVIDER_SITE_OTHER): Payer: BLUE CROSS/BLUE SHIELD | Admitting: Orthopedic Surgery

## 2018-06-19 DIAGNOSIS — M6701 Short Achilles tendon (acquired), right ankle: Secondary | ICD-10-CM

## 2018-06-19 DIAGNOSIS — M6702 Short Achilles tendon (acquired), left ankle: Secondary | ICD-10-CM

## 2018-06-19 DIAGNOSIS — M25871 Other specified joint disorders, right ankle and foot: Secondary | ICD-10-CM

## 2018-06-19 DIAGNOSIS — I87323 Chronic venous hypertension (idiopathic) with inflammation of bilateral lower extremity: Secondary | ICD-10-CM | POA: Diagnosis not present

## 2018-06-19 MED ORDER — LIDOCAINE HCL 1 % IJ SOLN
2.0000 mL | INTRAMUSCULAR | Status: AC | PRN
  Administered 2018-06-19: 2 mL

## 2018-06-19 MED ORDER — METHYLPREDNISOLONE ACETATE 40 MG/ML IJ SUSP
40.0000 mg | INTRAMUSCULAR | Status: AC | PRN
  Administered 2018-06-19: 40 mg via INTRA_ARTICULAR

## 2018-06-19 NOTE — Progress Notes (Signed)
Office Visit Note   Patient: Sonya Walton           Date of Birth: 1961/03/28           MRN: [id number] Visit Date: 06/19/2018              Requested by: Sela Hilding, MD [address] [place], [place] [address] PCP: Sela Hilding, MD  Chief Complaint  Patient presents with  . Right Foot - Pain      HPI: Patient is a 57 year old woman who presents in follow-up for impingement of the right ankle.  Patient states she did have good temporary relief with the previous injection.  Patient states she is also starting to develop some pain over the plantar aspect of the plantar fascia.  Patient states she is been having swelling in the lower extremities as well.  Patient states she is just stopped smoking.  Assessment & Plan: Visit Diagnoses:  1. Impingement of right ankle joint   2. Idiopathic chronic venous hypertension of both lower extremities with inflammation   3. Achilles tendon contracture, bilateral     Plan: Ankle was injected without complications patient was given instructions for Achilles stretching to do 5 times a day a minute at a time this was demonstrated to her she will get a pair of medical compression stockings.  Follow-Up Instructions: Return in about 4 weeks (around 07/17/2018).   Ortho Exam  Patient is alert, oriented, no adenopathy, well-dressed, normal affect, normal respiratory effort. Examination patient has good pulses bilaterally.  She does have heel cord tightness with dorsiflexion only to neutral with his knee extended.  She is tender to palpation over the plantar fascia on the right she has good ankle good subtalar motion she is point tender to palpation of the anterior joint line of the right ankle there is swelling and venous stasis swelling of both lower extremities with pitting edema but no ulcers.  Imaging: No results found. No images are attached to the encounter.  Labs: Lab Results  Component Value Date   HGBA1C 6.0 11/24/2017     HGBA1C 6.1 04/12/2016   HGBA1C 5.6 08/17/2013     Lab Results  Component Value Date   ALBUMIN 4.1 01/17/2012   ALBUMIN 3.7 12/31/2011    There is no height or weight on file to calculate BMI.  Orders:  No orders of the defined types were placed in this encounter.  No orders of the defined types were placed in this encounter.    Procedures: Medium Joint Inj: R ankle on 06/19/2018 3:59 PM Indications: pain and diagnostic evaluation Details: 22 G 1.5 in needle, anteromedial approach Medications: 2 mL lidocaine 1 %; 40 mg methylPREDNISolone acetate 40 MG/ML Outcome: tolerated well, no immediate complications Procedure, treatment alternatives, risks and benefits explained, specific risks discussed. Consent was given by the patient. Immediately prior to procedure a time out was called to verify the correct patient, procedure, equipment, support staff and site/side marked as required. Patient was prepped and draped in the usual sterile fashion.      Clinical Data: No additional findings.  ROS:  All other systems negative, except as noted in the HPI. Review of Systems  Objective: Vital Signs: There were no vitals taken for this visit.  Specialty Comments:  No specialty comments available.  PMFS History: Patient Active Problem List   Diagnosis Date Noted  . Plantar fasciitis of right foot 11/24/2017  . Skin pustule 11/24/2016  . Pain in joint, shoulder region  04/12/2016  . Prediabetes 04/12/2016  . Rotator cuff arthropathy 12/30/2015  . Partial tear of right rotator cuff 05/02/2015  . Low back pain 04/06/2015  . Hypertension 10/05/2012  . Tobacco abuse 12/31/2011  . Arthritis 12/31/2011   Past Medical History:  Diagnosis Date  . Arthritis   . Hoarse voice quality 01/19/2016  . Hypertension    states under control with med., has been on med. since 2011  . Immature cataract   . 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 Partial tear of right rotator cuff 12/2015   . Wears dentures    upper    Family History  Problem Relation Age of Onset  . Liver cancer Father   . Cancer Father        lung and liver  . Heart disease Mother   . Hyperlipidemia Mother   . Hypertension Mother   . Heart failure Mother   . Hypertension Other   . Diabetes Other   . Asthma Other   . Diabetes type II Sister   . Diabetes type II Brother   . Cancer Paternal Aunt        lung  . Asthma Sister   . Diabetes Sister   . Hypertension Sister   . Early death Sister        premature twins   . Hypertension Brother   . Diabetes Brother   . Asthma Brother   . Cancer Unknown        lung  . Breast cancer Sister 9  . COPD Sister     Past Surgical History:  Procedure Laterality Date  . BREAST BIOPSY Left   . BUNIONECTOMY Bilateral 1994  . CHOLECYSTECTOMY  01/17/2012   Procedure: LAPAROSCOPIC CHOLECYSTECTOMY;  Surgeon: Harl Bowie, MD;  Location: [facility];  Service: General;  Laterality: N/A;  . PUBOVAGINAL SLING  03/05/2003  . SHOULDER ARTHROSCOPY WITH DISTAL CLAVICLE RESECTION Right 01/26/2016   Procedure: SHOULDER ARTHROSCOPY WITH DISTAL CLAVICLE RESECTION;  Surgeon: Tania Ade, MD;  Location: [facility];  Service: Orthopedics;  Laterality: Right;  . SHOULDER ARTHROSCOPY WITH SUBACROMIAL DECOMPRESSION Right 01/26/2016   Procedure: SHOULDER ARTHROSCOPY WITH SUBACROMIAL DECOMPRESSION DEBRIDEMENT;  Surgeon: Tania Ade, MD;  Location: [facility];  Service: Orthopedics;  Laterality: Right;  . TONSILLECTOMY  1975  . TOTAL VAGINAL HYSTERECTOMY  03/05/2003  . TUBAL LIGATION  1981   Social History   Occupational History  . Not on file  Tobacco Use  . Smoking status: Current Every Day Smoker    Packs/day: 1.00    Years: 34.00    Pack years: 34.00    Types: Cigarettes    Start date: 11/01/1981  . Smokeless tobacco: Never Used  . Tobacco comment: 1 ppd max use.   Substance and Sexual Activity  . Alcohol use: Yes    Alcohol/week:  0.0 standard drinks    Comment: rarely  . Drug use: No  . Sexual activity: Yes    Partners: Male

## 2018-07-05 ENCOUNTER — Other Ambulatory Visit: Payer: Self-pay

## 2018-07-05 ENCOUNTER — Ambulatory Visit: Payer: PRIVATE HEALTH INSURANCE | Admitting: Family Medicine

## 2018-07-05 ENCOUNTER — Other Ambulatory Visit: Payer: Self-pay | Admitting: Family Medicine

## 2018-07-05 VITALS — BP 110/80 | HR 85 | Temp 99.5°F | Wt 210.0 lb

## 2018-07-05 DIAGNOSIS — N6320 Unspecified lump in the left breast, unspecified quadrant: Secondary | ICD-10-CM

## 2018-07-05 NOTE — Progress Notes (Signed)
   Subjective:    Patient ID: Sonya Walton, female    DOB: 10-02-1961, 57 y.o.   MRN: [id number]   CC: breast lump  HPI:  L Breast Lump: Patient noticed area on breast couple of days ago.  Patient reports that it is not painful.  Patient reports that she has had no discharge.  She reports that there is some redness around the area.  Patient reports that she has a family history of breast cancer.  Reports that her oldest sister had breast cancer that required lumpectomy and chemotherapy at age 41.  Patient reports that her last mammogram was in November 2018 and was normal.  After exam, patient reports that she has previously had to have diagnostic mammogram and biopsy of her breast for previous sites.   Smoking status reviewed  ROS: 10 point ROS is otherwise negative, except as mentioned in HPI  Patient Active Problem List   Diagnosis Date Noted  . Left breast mass 07/07/2018  . Plantar fasciitis of right foot 11/24/2017  . Skin pustule 11/24/2016  . Pain in joint, shoulder region 04/12/2016  . Prediabetes 04/12/2016  . Rotator cuff arthropathy 12/30/2015  . Partial tear of right rotator cuff 05/02/2015  . Low back pain 04/06/2015  . Hypertension 10/05/2012  . Tobacco abuse 12/31/2011  . Arthritis 12/31/2011     Objective:  BP 110/80 (BP Location: Left Arm, Patient Position: Sitting, Cuff Size: Normal)   Pulse 85   Temp 99.5 F (37.5 C) (Oral)   Wt 210 lb (95.3 kg)   SpO2 95%   BMI 38.41 kg/m  Vitals and nursing note reviewed  General: NAD, pleasant Breasts: right breast normal without mass, skin or nipple changes or axillary nodes; left breast normal with 1 cm diameter palpable mass in 11 o'clock position that is immobile with some surrounding erythema and no induration, no skin or nipple changes or axillary nodes noted. Skin: warm and dry, no rashes noted Neuro: alert and oriented, no focal deficits Psych: normal affect  Assessment & Plan:    Left breast  mass Does not appear to be abscess, could be fibrotic changes.  Given patient's history of having a primary relative with breast cancer and previously requiring biopsy, as well as mass noting to be immobile on exam.  Patient to have diagnostic left breast mammogram with possible ultrasound imaging of left breast and axilla scheduled for 09/06.  Martinique Vong Garringer, DO Family Medicine Resident PGY-2

## 2018-07-05 NOTE — Patient Instructions (Signed)
Thank you for coming to see me today. It was a pleasure! Today we talked about:   Your breast tissue. I would like for you to have an ultrasound and mammogram of the area given your history to ensure that the area is nothing worrisome.   We will call you with the results.   Please follow-up with your PCP as needed.  If you have any questions or concerns, please do not hesitate to call the office at [phone].  Take Care,   [place] Jeanne Terrance, DO

## 2018-07-07 ENCOUNTER — Ambulatory Visit: Payer: PRIVATE HEALTH INSURANCE

## 2018-07-07 ENCOUNTER — Ambulatory Visit
Admission: RE | Admit: 2018-07-07 | Discharge: 2018-07-07 | Disposition: A | Discharge status: Home / Self Care | Payer: PRIVATE HEALTH INSURANCE | Source: Ambulatory Visit | Attending: Family Medicine | Admitting: Family Medicine

## 2018-07-07 DIAGNOSIS — N6320 Unspecified lump in the left breast, unspecified quadrant: Secondary | ICD-10-CM

## 2018-07-07 NOTE — Assessment & Plan Note (Addendum)
Does not appear to be abscess, could be fibrotic changes.  Given patient's history of having a primary relative with breast cancer and previously requiring biopsy, as well as mass noting to be immobile on exam.  Patient to have diagnostic left breast mammogram with possible ultrasound imaging of left breast and axilla scheduled for 09/06.

## 2018-07-17 ENCOUNTER — Ambulatory Visit (INDEPENDENT_AMBULATORY_CARE_PROVIDER_SITE_OTHER): Payer: BLUE CROSS/BLUE SHIELD | Admitting: Orthopedic Surgery

## 2018-09-15 ENCOUNTER — Other Ambulatory Visit: Payer: Self-pay | Admitting: Family Medicine

## 2018-09-15 DIAGNOSIS — Z1231 Encounter for screening mammogram for malignant neoplasm of breast: Secondary | ICD-10-CM

## 2018-10-04 ENCOUNTER — Ambulatory Visit: Payer: PRIVATE HEALTH INSURANCE | Admitting: Family Medicine

## 2018-10-26 ENCOUNTER — Ambulatory Visit: Payer: PRIVATE HEALTH INSURANCE | Admitting: Family Medicine

## 2018-10-26 NOTE — Progress Notes (Deleted)
   CC: ***  HPI  ROS: ***Denies CP, SOB, abdominal pain, dysuria, changes in BMs.   CC, SH/smoking status, and VS noted  Objective: There were no vitals taken for this visit. Gen: NAD, alert, cooperative, and pleasant.*** HEENT: NCAT, EOMI, PERRL CV: RRR, no murmur Resp: CTAB, no wheezes, non-labored Abd: SNTND, BS present, no guarding or organomegaly Ext: No edema, warm Neuro: Alert and oriented, Speech clear, No gross deficits  Assessment and plan:  No problem-specific Assessment & Plan notes found for this encounter.   No orders of the defined types were placed in this encounter.   No orders of the defined types were placed in this encounter.   Health Maintenance reviewed - {health maintenance:315237}.  Ralene Ok, MD, PGY3 10/26/2018 8:10 AM

## 2018-10-27 ENCOUNTER — Ambulatory Visit
Admission: RE | Admit: 2018-10-27 | Discharge: 2018-10-27 | Disposition: A | Discharge status: Home / Self Care | Payer: PRIVATE HEALTH INSURANCE | Source: Ambulatory Visit | Attending: Family Medicine | Admitting: Family Medicine

## 2018-10-27 DIAGNOSIS — Z1231 Encounter for screening mammogram for malignant neoplasm of breast: Secondary | ICD-10-CM

## 2018-11-14 ENCOUNTER — Other Ambulatory Visit: Payer: Self-pay | Admitting: Family Medicine

## 2018-11-14 DIAGNOSIS — I10 Essential (primary) hypertension: Secondary | ICD-10-CM

## 2018-12-04 ENCOUNTER — Other Ambulatory Visit: Payer: Self-pay

## 2018-12-04 ENCOUNTER — Encounter: Payer: Self-pay | Admitting: Family Medicine

## 2018-12-04 ENCOUNTER — Ambulatory Visit: Payer: PRIVATE HEALTH INSURANCE | Admitting: Family Medicine

## 2018-12-04 VITALS — BP 124/68 | HR 68 | Temp 98.2°F | Ht 62.0 in | Wt 214.0 lb

## 2018-12-04 DIAGNOSIS — I10 Essential (primary) hypertension: Secondary | ICD-10-CM

## 2018-12-04 DIAGNOSIS — Z1211 Encounter for screening for malignant neoplasm of colon: Secondary | ICD-10-CM | POA: Diagnosis not present

## 2018-12-04 DIAGNOSIS — Z720 Tobacco use: Secondary | ICD-10-CM | POA: Diagnosis not present

## 2018-12-04 NOTE — Patient Instructions (Signed)
It was a pleasure to see you today! Thank you for choosing [facility] for your primary care. Sonya Walton was seen for cough, blood pressure.   Our plans for today were:  Come back and see Dr. Valentina Lucks for your lung tests.   They should call you for the colonoscopy.   I am so impressed that you stopped smoking, that's incredible!  The cough should get better over time.   Best,  Dr. Lindell Noe

## 2018-12-04 NOTE — Progress Notes (Signed)
   CC: HTN, cough, HM   HPI  Quit smoking on her birthday last year, used patches. Doesn't miss it at all. Easier than the first time. Has gained a little bit of weight, but feels like she can breath much better.   Still coughing and wheezing after UC visit 1/15. No inhalers at home. Completed antibiotic as prescribed. No DOE. Missed her PFT appt.   HM - wants colonoscopy and mammo orders.   ROS: Denies CP, SOB, abdominal pain, dysuria, changes in BMs.   CC, SH/smoking status, and VS noted  Objective: BP 124/68   Pulse 68   Temp 98.2 F (36.8 C) (Oral)   Ht 5\' 2"  (1.575 m)   Wt 214 lb (97.1 kg)   SpO2 96%   BMI 39.14 kg/m  Gen: NAD, alert, cooperative, and pleasant. HEENT: NCAT, EOMI, PERRL CV: RRR, no murmur Resp: CTAB, no wheezes, non-labored Ext: No edema, warm Neuro: Alert and oriented, Speech clear, No gross deficits  Assessment and plan:  Tobacco abuse She has now completely quit, which is amazing. Congratulated her.   Hypertension Well controlled, check BMP today.   Cough - possibly post viral, no wheezing on exam today. Still think PFTs would be of benefit. Patient will monitor her cough and call us if continued concern, she will schedule PFTs.   Orders Placed This Encounter  Procedures  . CBC  . Basic metabolic panel  . Lipid panel  . Ambulatory referral to Gastroenterology    Referral Priority:   Routine    Referral Type:   Consultation    Referral Reason:   Specialty Services Required    Number of Visits Requested:   1    No orders of the defined types were placed in this encounter.  Health Maintenance reviewed - patient asked to schedule her mammogram.  Ralene Ok, MD, PGY3 12/06/2018 9:25 AM

## 2018-12-05 ENCOUNTER — Telehealth: Payer: Self-pay

## 2018-12-05 LAB — BASIC METABOLIC PANEL
BUN/Creatinine Ratio: 15 (ref 9–23)
BUN: 10 mg/dL (ref 6–24)
CO2: 22 mmol/L (ref 20–29)
Calcium: 9.6 mg/dL (ref 8.7–10.2)
Chloride: 97 mmol/L (ref 96–106)
Creatinine, Ser: 0.66 mg/dL (ref 0.57–1.00)
GFR calc Af Amer: 113 mL/min/{1.73_m2} (ref >59)
GFR calc non Af Amer: 98 mL/min/{1.73_m2} (ref >59)
Glucose: 164 mg/dL — ABNORMAL HIGH (ref 65–99)
Potassium: 3.9 mmol/L (ref 3.5–5.2)
Sodium: 137 mmol/L (ref 134–144)

## 2018-12-05 LAB — LIPID PANEL
Chol/HDL Ratio: 3 ratio (ref 0.0–4.4)
Cholesterol, Total: 165 mg/dL (ref 100–199)
HDL: 55 mg/dL (ref >39)
LDL Calculated: 87 mg/dL (ref 0–99)
Triglycerides: 115 mg/dL (ref 0–149)
VLDL Cholesterol Cal: 23 mg/dL (ref 5–40)

## 2018-12-05 LAB — CBC
Hematocrit: 46 % (ref 34.0–46.6)
Hemoglobin: 15.4 g/dL (ref 11.1–15.9)
MCH: 29.8 pg (ref 26.6–33.0)
MCHC: 33.5 g/dL (ref 31.5–35.7)
MCV: 89 fL (ref 79–97)
Platelets: 322 10*3/uL (ref 150–450)
RBC: 5.17 x10E6/uL (ref 3.77–5.28)
RDW: 13.9 % (ref 11.7–15.4)
WBC: 10 10*3/uL (ref 3.4–10.8)

## 2018-12-05 NOTE — Telephone Encounter (Signed)
Called pt. VM not set up. If pt calls, please let her know her labs look excellent (see Dr. Rosario Jacks note below). Ottis Stain, CMA

## 2018-12-05 NOTE — Telephone Encounter (Signed)
-----   Message from Sela Hilding, MD sent at 12/05/2018  9:46 AM EST ----- Please let patient know her labs look excellent.

## 2018-12-05 NOTE — Telephone Encounter (Signed)
2nd attempt to contact pt VM not set up, please inform her of message below if she calls back. Katharina Caper, Hernan Turnage D

## 2018-12-06 NOTE — Assessment & Plan Note (Signed)
She has now completely quit, which is amazing. Congratulated her.

## 2018-12-06 NOTE — Assessment & Plan Note (Signed)
Well controlled, check BMP today.

## 2018-12-11 NOTE — Telephone Encounter (Signed)
Pt called back, informed of results. Petro Talent, Salome Spotted, CMA

## 2018-12-22 ENCOUNTER — Ambulatory Visit: Payer: PRIVATE HEALTH INSURANCE | Admitting: Pharmacist

## 2018-12-22 ENCOUNTER — Telehealth: Payer: Self-pay | Admitting: Family Medicine

## 2018-12-22 NOTE — Telephone Encounter (Signed)
Checked in referral and it stated it was sent to [facility], gave pt the number to the office so she could call and try to get this scheduled.  Told her if she had any problems to give [place] a call back. Katharina Caper, Milo Schreier D, [place]

## 2018-12-22 NOTE — Telephone Encounter (Signed)
Pt called and said she saw Dr. Lindell Noe on 12/04/2018. The Pt said that Dr. Lindell Noe order an Colonoscopy procedure done but the Pt never got a call from the colonoscopy center. ad

## 2019-01-08 ENCOUNTER — Ambulatory Visit: Payer: PRIVATE HEALTH INSURANCE | Admitting: Pharmacist

## 2019-01-08 ENCOUNTER — Encounter: Payer: Self-pay | Admitting: Pharmacist

## 2019-01-08 DIAGNOSIS — Z87891 Personal history of nicotine dependence: Secondary | ICD-10-CM

## 2019-01-08 NOTE — Progress Notes (Signed)
Patient ID: Sonya Walton, female   DOB: January 15, 1961, 58 y.o.   MRN: [id number] Reviewed: Agree with Dr. Graylin Shiver documentation and management.

## 2019-01-08 NOTE — Assessment & Plan Note (Signed)
Patient has been experiencing some shortness of breath with exertion after quitting smoking one year ago. Spirometry evaluation reveals Moderate restrictive lung disease. Post nebulized albuterol tx revealed no significant improvement.  mMRC score of 1 for recent upper respiratory infection (two total within the last year) and a CAT score of 11 significant for cough, phlegm in chest, and reporting no energy at the end of the day after work. -no change in medications at this time. Established new goal of walking 3 times per week and weight loss short-term goal to ~180 lbs -May consider adding on Incruse in the future if breathing is not optimized by exercise alone. -Reviewed results of pulmonary function tests.  Pt verbalized understanding of results and education.     35 lb weight gain since September 2019 secondary to sedentary lifestyle.

## 2019-01-08 NOTE — Progress Notes (Signed)
   S:    Patient arrives ambulating without assistance and in good spirits.  Presents for first time lung function evaluation.   Patient was referred by PCP, Dr. Lindell Noe at last visit on 12/04/2018. Patient reports breathing has been much improved since quitting smoking 1 year ago. She reports a history of bronchitis first diagnosed in 2010/2011.   Patient reports adherence to medications Patient reports last dose of COPD medications was n/a Patient exacerbation hx: respiratory infection in January 2020. Has since completed antibiotics and steroids for this. Reports two total exacerbations like this within the last year and a half.  O: Physical Exam Vitals signs reviewed.  Pulmonary:     Effort: Pulmonary effort is normal.  Psychiatric:        Mood and Affect: Mood normal.      Review of Systems  Respiratory: Positive for shortness of breath (on exertion).   All other systems reviewed and are negative.   Vitals:   01/08/19 1044  BP: 132/74  Pulse: 72  SpO2: 97%    mMRC score= 1 CAT score= 11 See Documentation Flowsheet - CAT/COPD for complete symptom scoring.  See "scanned report" or Documentation Flowsheet (discrete results - PFTs) for Spirometry results. Patient provided good effort while attempting spirometry.   Lung Age = 89 Albuterol Neb  Lot# [id number]     Exp. 07/2020  A/P: Patient has been experiencing some shortness of breath with exertion after quitting smoking one year ago. Spirometry evaluation reveals Moderate restrictive lung disease. Post nebulized albuterol tx revealed no significant improvement.  mMRC score of 1 for recent upper respiratory infection (two total within the last year) and a CAT score of 11 significant for cough, phlegm in chest, and reporting no energy at the end of the day after work. -no change in medications at this time. Established new goal of walking 3 times per week and weight loss short-term goal to ~180 lbs -May consider adding on  Incruse in the future if breathing is not optimized by exercise alone. -Reviewed results of pulmonary function tests.  Pt verbalized understanding of results and education.     35 lb weight gain since September 2019 secondary to sedentary lifestyle.  Written pt instructions provided.  F/U Clinic visit with PCP in 2-3 months.    Total time in face to face counseling 45 minutes.  Patient seen with Laretta Bolster PharmD Candidate and Vertis Kelch, PharmD, PGY-1 resident.

## 2019-01-08 NOTE — Patient Instructions (Signed)
Thank you for coming today!  CONGRATULATIONS! You are 1 YEAR smoke free!! Keep up the great work!  Your lung function test today showed possible restriction and did not show much improvement after taking medication.  I think exercise would be the best option to help improve your lung function. We will discuss with Dr. Lindell Noe about possibly adding a new medicine, Incruse, to help with your breathing.  Please start walking 3 days a week to meet your new exercise goal.  We can repeat the lung function test in about 2-3 years.  Please schedule a follow up visit with Dr. Lindell Noe in about 2-3 months to discuss if medicines may help you after you start exercising regularly.

## 2019-01-11 NOTE — Addendum Note (Signed)
Addended by: Leavy Cella on: 01/11/2019 11:02 AM   Modules accepted: Orders

## 2019-02-12 ENCOUNTER — Other Ambulatory Visit: Payer: Self-pay | Admitting: *Deleted

## 2019-02-12 DIAGNOSIS — I10 Essential (primary) hypertension: Secondary | ICD-10-CM

## 2019-02-12 MED ORDER — ATENOLOL-CHLORTHALIDONE 50-25 MG PO TABS: ORAL_TABLET | ORAL | 0 refills | Status: DC

## 2019-02-20 ENCOUNTER — Telehealth: Payer: Self-pay | Admitting: Pharmacist

## 2019-02-20 NOTE — Telephone Encounter (Signed)
Called and left message on office answering machine requesting call back RE Corona Virus exposure and her lung disease.   States cases have been identified at her place of work.

## 2019-02-20 NOTE — Telephone Encounter (Signed)
Attempted to return call - phone has Voice Mail that has not been set up.    Will plan to call again to discuss previous question.   I will also route to PCP as FYI.

## 2019-02-21 NOTE — Telephone Encounter (Signed)
No answer, Voice mail not set up.  Will try again 4/23

## 2019-02-21 NOTE — Telephone Encounter (Signed)
Attempted phone contact - Voice Mail - Not set up.  Retry later.

## 2019-02-22 NOTE — Telephone Encounter (Signed)
Attempted call - no answer - Voice Mail - "Not Set Up Yet"  Attempt to call during off - hours potentially she is at work?

## 2019-02-23 NOTE — Telephone Encounter (Signed)
Contacted patient who works at [employer] which has recently had a large number of covid-19 cases routinely.  She reports that she is comfortable with her current situation.  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    She reported excellent self-care using an n-95 mask with another mask over it AND listed multiple steps to prevent her home from becoming infected (chaning shoes and washing uniform immediately after each work shift).   She does NOT want/need any note from the doctor at this time but was interested to know if she could get a note if needed for her employer (stating that she has a lung condition and should only work in the un-infected wing of the nursing home.  She was calm, thoughtful and happy to converse about her current situation.    She stated that many of the positive patients were asymptomatic and "it sounds worse on TV".     No further follow up planned.

## 2019-02-26 NOTE — Telephone Encounter (Signed)
Noted and agree. 

## 2019-02-27 ENCOUNTER — Telehealth: Payer: Self-pay | Admitting: Family Medicine

## 2019-02-27 NOTE — Telephone Encounter (Signed)
Pt called and needs work note stating that she should not work with COVID patients due to her medical conditions. I am happy to provide this. Wrote letter. Fax to [employer] per patient request, fax number is [phone].

## 2019-03-19 ENCOUNTER — Other Ambulatory Visit: Payer: Self-pay

## 2019-03-19 ENCOUNTER — Ambulatory Visit (INDEPENDENT_AMBULATORY_CARE_PROVIDER_SITE_OTHER): Payer: PRIVATE HEALTH INSURANCE | Admitting: Family Medicine

## 2019-03-19 ENCOUNTER — Encounter: Payer: Self-pay | Admitting: Family Medicine

## 2019-03-19 VITALS — BP 128/72 | HR 66

## 2019-03-19 DIAGNOSIS — M545 Low back pain, unspecified: Secondary | ICD-10-CM

## 2019-03-19 DIAGNOSIS — I10 Essential (primary) hypertension: Secondary | ICD-10-CM

## 2019-03-19 DIAGNOSIS — F430 Acute stress reaction: Secondary | ICD-10-CM | POA: Diagnosis not present

## 2019-03-19 MED ORDER — CYCLOBENZAPRINE HCL 10 MG PO TABS: 10.0000 mg | ORAL_TABLET | Freq: Three times a day (TID) | ORAL | 0 refills | Status: DC | PRN

## 2019-03-19 MED ORDER — ATENOLOL-CHLORTHALIDONE 50-25 MG PO TABS: ORAL_TABLET | ORAL | 3 refills | Status: DC

## 2019-03-19 NOTE — Patient Instructions (Addendum)
I sent blood pressure refills.  For your back, I sent a muscle relaxer, please don't take this before you drive. Try it first at night. Call me if things get worse or don't get better. Back pain often occurs when our core muscles are weak. So when you get better, doing some gentle ab exercises like pilates (there are free youtube videos) can help.  Wear your insoles if you can.  Call me if you need more help with work duties issues.

## 2019-03-19 NOTE — Progress Notes (Signed)
   CC: back pain, COVID stress  HPI  Back- tried APAP, reached down to pick up soda package felt a pop and pain going from lateral R midback to R hip. 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   She wanted to follow up on counseling about how to avoid COVID risk. She works at [employer] and they have two divided halls. The green hall is for positive patient, but the side she is on is at increased risk and they have removed patients recently from that hall due to positive. They tested all staff last week.   ROS: Denies CP, SOB, abdominal pain, dysuria, changes in BMs.   CC, SH/smoking status, and VS noted  Objective: BP 128/72   Pulse 66   SpO2 96%  Gen: NAD, alert, cooperative, and pleasant. HEENT: NCAT, EOMI, PERRL CV: RRR, no murmur Resp: CTAB, no wheezes, non-labored Ext: No edema, warm, TTP over right lumbar back lateral to spine.  Neuro: Alert and oriented, Speech clear, No gross deficits  Assessment and plan:  Hypertension Well controlled, recent BMP ok. Continue atenolol-chlorthalidone. Refilled.   Low back pain Likely MSK strain. Use flexeril prn not before driving. Call if changes or no improvement.   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 I encouraged her to continue self care via wearing appropriate PPE, which she is. I listened to her fears. 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   Meds ordered this encounter  Medications  . atenolol-chlorthalidone (TENORETIC) 50-25 MG tablet    Sig: TAKE 1 TABLET BY MOUTH EVERY DAY    Dispense:  90 tablet    Refill:  3  . cyclobenzaprine (FLEXERIL) 10 MG tablet    Sig: Take 1 tablet (10 mg total) by mouth 3 (three) times daily as  needed for muscle spasms.    Dispense:  20 tablet    Refill:  0     Ralene Ok, MD, PGY3 03/21/2019 9:34 AM

## 2019-03-21 NOTE — Assessment & Plan Note (Signed)
Likely MSK strain. Use flexeril prn not before driving. Call if changes or no improvement.

## 2019-03-21 NOTE — Assessment & Plan Note (Signed)
Well controlled, recent BMP ok. Continue atenolol-chlorthalidone. Refilled.

## 2019-03-30 ENCOUNTER — Telehealth: Payer: Self-pay | Admitting: Family Medicine

## 2019-03-30 NOTE — Telephone Encounter (Signed)
She called and talked to her supervisor at [employer] and she felt this person wasn't listening to what she was saying that she wanted to go on leave because of her lungs. She says this person named Andee Poles told her that this wasn't a reason to take medical leave (fear of contracting COVID with concomitant lung disease) and she would be taken off the schedule and would not have a job. She says she does have a specific HR department, and she hasn't talked to them. I encouraged her to call them because I'm not sure that Andee Poles is the correct person to remove someone from work if she does not work for HR. I explained that HR should be a group to handle disputes between employees and advocate for employees. She will call me back with an update after she talked to HR.

## 2019-03-30 NOTE — Telephone Encounter (Signed)
Pt called requesting a call from PCP due to work concerns. Please give pt a call back.

## 2019-03-30 NOTE — Telephone Encounter (Signed)
Called patient - she doesn't feel sick, but she was tested for COVID yesterday and it was negative. She has had two co workers test positive the last week. She doesn't want to keep working because she is scared. She is going to talk to her HR department and call me back. She is supposed to work this weekend. I'm happy to help her with FMLA, but she will need this to protect her job if I write her out.

## 2019-03-30 NOTE — Telephone Encounter (Signed)
Pt called returning PCP's call regarding information she received from her job about FMLA paperwork. Please give pt a call back.

## 2019-04-03 ENCOUNTER — Telehealth (INDEPENDENT_AMBULATORY_CARE_PROVIDER_SITE_OTHER): Payer: PRIVATE HEALTH INSURANCE | Admitting: Family Medicine

## 2019-04-03 ENCOUNTER — Other Ambulatory Visit: Payer: Self-pay

## 2019-04-03 ENCOUNTER — Other Ambulatory Visit: Payer: PRIVATE HEALTH INSURANCE

## 2019-04-03 ENCOUNTER — Telehealth: Payer: Self-pay

## 2019-04-03 DIAGNOSIS — Z20822 Contact with and (suspected) exposure to covid-19: Secondary | ICD-10-CM

## 2019-04-03 DIAGNOSIS — Z20828 Contact with and (suspected) exposure to other viral communicable diseases: Secondary | ICD-10-CM

## 2019-04-03 NOTE — Telephone Encounter (Signed)
Dr. Lindell Noe request COVID 19 TEST.

## 2019-04-03 NOTE — Progress Notes (Signed)
[facility] Telemedicine Visit  Patient consented to have virtual visit. 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 She hasn't been back to [employer] since last Thursday. Her roommate is her niece, who also works at [employer]. Her niece is now positive now as of last Thursday (Althought it sounds like she worked there Sunday?). Niece has a headache and decreased appetite. Sonya Walton has no change in breathing, no headaches. Sunday [name] realized she didn't smell a skunk that was in the road that others who were in the car smelled. This concerned her that something was wrong. No fever. No cough or SOB.  ROS: per HPI  Pertinent PMHx: restrictive lung disease   Exam:  Respiratory: speaks in long sentences easily   Assessment/Plan:  COVID exposure - niece lives with her and is positive for COVID. 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 She should self quarantine and try to stay away from her niece and clean shared spaces often. I will message testing team, told patient they would call her.   Time spent during visit with patient: 10 minutes  Ralene Ok, MD

## 2019-04-04 LAB — NOVEL CORONAVIRUS, NAA: SARS-CoV-2, NAA: NOT DETECTED

## 2019-06-10 ENCOUNTER — Other Ambulatory Visit: Payer: Self-pay

## 2019-06-10 ENCOUNTER — Emergency Department (HOSPITAL_COMMUNITY)
Admission: EM | Admit: 2019-06-10 | Discharge: 2019-06-10 | Disposition: A | Discharge status: Home / Self Care | Payer: PRIVATE HEALTH INSURANCE | Attending: Emergency Medicine | Admitting: Emergency Medicine

## 2019-06-10 ENCOUNTER — Encounter (HOSPITAL_COMMUNITY): Payer: Self-pay

## 2019-06-10 DIAGNOSIS — Z87891 Personal history of nicotine dependence: Secondary | ICD-10-CM | POA: Insufficient documentation

## 2019-06-10 DIAGNOSIS — L089 Local infection of the skin and subcutaneous tissue, unspecified: Secondary | ICD-10-CM | POA: Insufficient documentation

## 2019-06-10 DIAGNOSIS — L723 Sebaceous cyst: Secondary | ICD-10-CM | POA: Insufficient documentation

## 2019-06-10 DIAGNOSIS — I10 Essential (primary) hypertension: Secondary | ICD-10-CM | POA: Insufficient documentation

## 2019-06-10 DIAGNOSIS — Z79899 Other long term (current) drug therapy: Secondary | ICD-10-CM | POA: Insufficient documentation

## 2019-06-10 MED ORDER — LIDOCAINE-EPINEPHRINE (PF) 2 %-1:200000 IJ SOLN
10.0000 mL | Freq: Once | INTRAMUSCULAR | Status: AC
  Administered 2019-06-10: 10 mL
  Filled 2019-06-10: qty 20

## 2019-06-10 MED ORDER — DOXYCYCLINE HYCLATE 100 MG PO CAPS: 100.0000 mg | ORAL_CAPSULE | Freq: Two times a day (BID) | ORAL | 0 refills | Status: DC

## 2019-06-10 NOTE — ED Provider Notes (Signed)
[facility]PART[facility] Provider Note   CSN: [id number] Arrival date & time: 06/10/19  1035     History   Chief Complaint Chief Complaint  Patient presents with  . scalp abscess    HPI Sonya Walton is a 58 y.o. female.     Sonya Walton is a 58 y.o. female with history of hypertension, arthritis, who presents to the emergency department for evaluation of abscess to the scalp.  She reports that 4 days ago she noticed a small bump on her scalp at the crown of her head that was tender to palpation over the past few days this has increased in size and become extremely tender to palpation.  She reports that her surrounding scalp feels warm and tender as well.  She has not noted any drainage from the area.  Pain is a persistent ache, worse with touch.  No fevers or chills.  No nausea or vomiting.  History of abscesses and boils in the past but never on the scalp.     Past Medical History:  Diagnosis Date  . Arthritis   . Hoarse voice quality 01/19/2016  . Hypertension    states under control with med., has been on med. since 2011  . Immature cataract   . 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 Partial tear of right rotator cuff 12/2015  . Wears dentures    upper    Patient Active Problem List   Diagnosis Date Noted  . Left breast mass 07/07/2018  . Plantar fasciitis of right foot 11/24/2017  . Skin pustule 11/24/2016  . Pain in joint, shoulder region 04/12/2016  . Prediabetes 04/12/2016  . Rotator cuff arthropathy 12/30/2015  . Partial tear of right rotator cuff 05/02/2015  . Low back pain 04/06/2015  . Hypertension 10/05/2012  . History of tobacco use 12/31/2011  . Arthritis 12/31/2011    Past Surgical History:  Procedure Laterality Date  . BREAST BIOPSY Left   . BUNIONECTOMY Bilateral 1994  . CHOLECYSTECTOMY  01/17/2012   Procedure: LAPAROSCOPIC CHOLECYSTECTOMY;  Surgeon: Harl Bowie, MD;  Location: [facility];  Service:  General;  Laterality: N/A;  . PUBOVAGINAL SLING  03/05/2003  . SHOULDER ARTHROSCOPY WITH DISTAL CLAVICLE RESECTION Right 01/26/2016   Procedure: SHOULDER ARTHROSCOPY WITH DISTAL CLAVICLE RESECTION;  Surgeon: Tania Ade, MD;  Location: [facility];  Service: Orthopedics;  Laterality: Right;  . SHOULDER ARTHROSCOPY WITH SUBACROMIAL DECOMPRESSION Right 01/26/2016   Procedure: SHOULDER ARTHROSCOPY WITH SUBACROMIAL DECOMPRESSION DEBRIDEMENT;  Surgeon: Tania Ade, MD;  Location: [facility];  Service: Orthopedics;  Laterality: Right;  . TONSILLECTOMY  1975  . TOTAL VAGINAL HYSTERECTOMY  03/05/2003  . TUBAL LIGATION  1981     OB History    Gravida  2   Para  2   Term  2   Preterm      AB      Living  2     SAB      TAB      Ectopic      Multiple      Live Births               Home Medications    Prior to Admission medications   Medication Sig Start Date End Date Taking? Authorizing Provider  acetaminophen (TYLENOL) 500 MG tablet Take 1,000 mg by mouth every 6 (six) hours as needed for mild pain or moderate pain (Takes PRN about twice weekly).  [provider]  atenolol-chlorthalidone (TENORETIC) 50-25 MG tablet TAKE 1 TABLET BY MOUTH EVERY DAY 03/19/19   Glenis Smoker, MD  Cholecalciferol (VITAMIN D3) 2000 UNITS TABS Take 2,000 Units by mouth every morning.    [provider]  cyclobenzaprine (FLEXERIL) 10 MG tablet Take 1 tablet (10 mg total) by mouth 3 (three) times daily as needed for muscle spasms. 03/19/19   Glenis Smoker, MD  pantoprazole (PROTONIX) 40 MG tablet Take 1 tablet (40 mg total) by mouth daily at 12 noon. 01/01/12 01/17/12  Charlynne Cousins, MD    Family History Family History  Problem Relation Age of Onset  . Liver cancer Father   . Cancer Father        lung and liver  . Heart disease Mother   . Hyperlipidemia Mother   . Hypertension Mother   . Heart failure Mother   .  Hypertension Other   . Diabetes Other   . Asthma Other   . Diabetes type II Sister   . Breast cancer Sister   . Diabetes type II Brother   . Cancer Paternal Aunt        lung  . Asthma Sister   . Diabetes Sister   . Hypertension Sister   . Early death Sister        premature twins   . Hypertension Brother   . Diabetes Brother   . Asthma Brother   . Cancer Other        lung  . Breast cancer Sister 68  . COPD Sister     Social History Social History   Tobacco Use  . Smoking status: Former Smoker    Packs/day: 1.00    Years: 36.00    Pack years: 36.00    Types: Cigarettes    Start date: 11/01/1981    Quit date: 01/09/2018    Years since quitting: 1.4  . Smokeless tobacco: Never Used  . Tobacco comment: 1 ppd max use.   Substance Use Topics  . Alcohol use: Yes    Alcohol/week: 0.0 standard drinks    Comment: rarely  . Drug use: No     Allergies   Percocet [oxycodone-acetaminophen]   Review of Systems Review of Systems  Constitutional: Negative for chills and fever.  Gastrointestinal: Negative for nausea and vomiting.  Skin:       Scalp abscess     Physical Exam Updated Vital Signs BP 138/71 (BP Location: Left Arm)   Pulse 67   Temp 98.3 F (36.8 C) (Oral)   Resp 20   SpO2 95%   Physical Exam Vitals signs and nursing note reviewed.  Constitutional:      General: She is not in acute distress.    Appearance: She is well-developed. She is not diaphoretic.  HENT:     Head: Normocephalic and atraumatic.     Comments: 2.5 cm tender fluctuant area on the scalp at the crown of the head without expressible drainage, this appears to be an epidermal cyst, with surrounding erythema and tenderness suggestive of infection Eyes:     General:        Right eye: No discharge.        Left eye: No discharge.  Pulmonary:     Effort: Pulmonary effort is normal. No respiratory distress.  Skin:    General: Skin is warm and dry.  Neurological:     Mental Status: She  is alert and oriented to person, place, and time.  Coordination: Coordination normal.  Psychiatric:        Mood and Affect: Mood normal.        Behavior: Behavior normal.      ED Treatments / Results  Labs (all labs ordered are listed, but only abnormal results are displayed) Labs Reviewed - No data to display  EKG None  Radiology No results found.  Procedures .Marland KitchenIncision and Drainage  Date/Time: 06/10/2019 2:02 PM Performed by: Jacqlyn Larsen, PA-C Authorized by: Jacqlyn Larsen, PA-C   Consent:    Consent obtained:  Verbal   Consent given by:  Patient   Risks discussed:  Bleeding, incomplete drainage, pain, infection and damage to other organs   Alternatives discussed:  No treatment Location:    Type:  Cyst (Infected epidermal cyst)   Size:  2.5   Location:  Head   Head location:  Scalp Pre-procedure details:    Skin preparation:  Chloraprep Anesthesia (see MAR for exact dosages):    Anesthesia method:  Local infiltration   Local anesthetic:  Lidocaine 2% WITH epi Procedure type:    Complexity:  Simple Procedure details:    Incision types:  Single straight   Incision depth:  Dermal   Scalpel blade:  11   Wound management:  Probed and deloculated (cyst sac removed)   Drainage amount:  Moderate   Wound treatment:  Wound left open   Packing materials:  None Post-procedure details:    Patient tolerance of procedure:  Tolerated well, no immediate complications   (including critical care time)  Medications Ordered in ED Medications  lidocaine-EPINEPHrine (XYLOCAINE W/EPI) 2 %-1:200000 (PF) injection 10 mL (has no administration in time range)     Initial Impression / Assessment and Plan / ED Course  I have reviewed the triage vital signs and the nursing notes.  Pertinent labs & imaging results that were available during my care of the patient were reviewed by me and considered in my medical decision making (see chart for details).  Patient presents with  infected sebaceous cyst of the scalp, present and worsening over the past 4 days.  On arrival vitals normal and patient not experiencing any systemic symptoms.  Incision and drainage performed and able to remove the entire cyst sac.  Will place patient on doxycycline for antibiotic prophylaxis given history of multiple abscesses in the past.  Discussed appropriate wound care and return precautions.  Patient expresses understanding and agreement with plan.  Discharged home in good condition.  Final Clinical Impressions(s) / ED Diagnoses   Final diagnoses:  Infected sebaceous cyst    ED Discharge Orders    None       Jacqlyn Larsen, [place] 06/10/19 1714    Lucrezia Starch, MD 06/11/19 1328

## 2019-06-10 NOTE — Discharge Instructions (Addendum)
You had an infected cyst which was removed today, please take antibiotics as directed.  The area may continue to drain somewhat this is normal, and it should heal on its own.  It is okay to shower, but do not submerge the head underwater, no swimming.  Monitor for signs of worsening infection such as increasing pain, redness, swelling, increasing drainage, fevers or any other new or concerning symptoms.

## 2019-06-10 NOTE — ED Triage Notes (Signed)
Patient here with abscess to scalp since Wednesday, complains of tenderness and pain around site

## 2019-07-20 ENCOUNTER — Other Ambulatory Visit: Payer: Self-pay

## 2019-07-20 ENCOUNTER — Encounter: Payer: Self-pay | Admitting: Family Medicine

## 2019-07-20 ENCOUNTER — Ambulatory Visit (INDEPENDENT_AMBULATORY_CARE_PROVIDER_SITE_OTHER): Payer: Self-pay | Admitting: Family Medicine

## 2019-07-20 ENCOUNTER — Other Ambulatory Visit (HOSPITAL_COMMUNITY)
Admission: RE | Admit: 2019-07-20 | Discharge: 2019-07-20 | Disposition: A | Discharge status: Home / Self Care | Payer: Self-pay | Source: Ambulatory Visit | Attending: Family Medicine | Admitting: Family Medicine

## 2019-07-20 VITALS — BP 110/64 | HR 78 | Wt 198.6 lb

## 2019-07-20 DIAGNOSIS — N898 Other specified noninflammatory disorders of vagina: Secondary | ICD-10-CM | POA: Insufficient documentation

## 2019-07-20 DIAGNOSIS — E1165 Type 2 diabetes mellitus with hyperglycemia: Secondary | ICD-10-CM

## 2019-07-20 DIAGNOSIS — R358 Other polyuria: Secondary | ICD-10-CM

## 2019-07-20 DIAGNOSIS — Z1211 Encounter for screening for malignant neoplasm of colon: Secondary | ICD-10-CM

## 2019-07-20 DIAGNOSIS — B3731 Acute candidiasis of vulva and vagina: Secondary | ICD-10-CM | POA: Insufficient documentation

## 2019-07-20 DIAGNOSIS — R3589 Other polyuria: Secondary | ICD-10-CM

## 2019-07-20 DIAGNOSIS — R300 Dysuria: Secondary | ICD-10-CM

## 2019-07-20 DIAGNOSIS — B373 Candidiasis of vulva and vagina: Secondary | ICD-10-CM

## 2019-07-20 LAB — POCT GLYCOSYLATED HEMOGLOBIN (HGB A1C): HbA1c POC (<> result, manual entry): >15 % (ref 4.0–5.6)

## 2019-07-20 LAB — POCT URINALYSIS DIP (MANUAL ENTRY)
Bilirubin, UA: NEGATIVE
Blood, UA: NEGATIVE
Glucose, UA: >=1000 mg/dL — AB
Ketones, POC UA: NEGATIVE mg/dL
Nitrite, UA: NEGATIVE
Protein Ur, POC: NEGATIVE mg/dL
Spec Grav, UA: 1.01 (ref 1.010–1.025)
Urobilinogen, UA: 0.2 E.U./dL
pH, UA: 6.5 (ref 5.0–8.0)

## 2019-07-20 LAB — POCT WET PREP (WET MOUNT)
Clue Cells Wet Prep Whiff POC: NEGATIVE
Trichomonas Wet Prep HPF POC: ABSENT

## 2019-07-20 LAB — GLUCOSE, POCT (MANUAL RESULT ENTRY): POC Glucose: >600 mg/dl (ref 70–99)

## 2019-07-20 MED ORDER — NYSTATIN 100000 UNIT/GM EX CREA: 1.0000 "application " | TOPICAL_CREAM | Freq: Two times a day (BID) | CUTANEOUS | 0 refills | Status: DC

## 2019-07-20 MED ORDER — TRESIBA FLEXTOUCH 100 UNIT/ML ~~LOC~~ SOPN: 100.0000 [IU] | PEN_INJECTOR | Freq: Every day | SUBCUTANEOUS | 0 refills | Status: DC

## 2019-07-20 MED ORDER — NYSTATIN 100000 UNIT/GM EX POWD: Freq: Four times a day (QID) | CUTANEOUS | 0 refills | Status: DC

## 2019-07-20 MED ORDER — BLOOD GLUCOSE MONITOR KIT: 1.0000 | PACK | 0 refills | Status: AC

## 2019-07-20 NOTE — Assessment & Plan Note (Addendum)
Large intertriginous erythematous patch with scaly borders and some satelite lesions with maceration between gluteal folds. Appears to be yeast like. Can also consider perianal stretococal celluitis. No yeast on wet prep; however, given substantial infection will give PO  - nystatin cream + powder to the pharmacy. Patient reported some pain with cream previously.  - diflucan x 1 tablet

## 2019-07-20 NOTE — Assessment & Plan Note (Signed)
Patient A1C > 15 and CBG > 600. No n/v, changes in vision, confusion. Complains of polydipsia and polyuria. Urine with >1,000 glucose.  Started patient on Tresiba (Sample) at 10 units. Instructions to increase by 1 unit daily if her CBG is > 100. Given hand outs on diabetes and CBG monitoring. Also given worksheet to track CBGS over the weekend.  Unable to get labs as lab was closed. Patient to come back on Monday 9/20 for lab appointment to collect CMP.  Follow up appointment next week for further diabetic assessment.

## 2019-07-20 NOTE — Progress Notes (Signed)
Subjective  CC: Urinary Tract Infection  Sonya Walton is a 58 y.o. female who presents today with the following problems: Urinary Tract Infection: Patient complains of burning with urination, erythema of vaginal area and frequency She has had symptoms for several days. Patient also reports feeling "raw down there". Patient denies back pain, fever, stomach ache and vaginal discharge. Patient does not have a history of recurrent UTI.  Patient does not have a history of pyelonephritis. Patient also reports itching, but denies odor and discharge. She does report the area is red and tender around vulva.   Polyuria and polydipsia: Patient with history of pre-diabetes. She is not on any diabetic medication at this time. Patient denies visual changes, chest pain, confusion.   Pertinent PMFSHx: hx of tobacco use, prediabetes ROS: Pertinent ROS included in HPI.     Objective  Physical Exam:  BP 110/64   Pulse 78   Wt 90.1 kg   SpO2 94%   BMI 35.18 kg/m  General: elderly white female who appears older than stated age in NAD.  GU: External vulva and vagina entirely erythematous with continuation of erythematous patch through perineal and peri anal area between gluteals. Maceration present. There is hypersthesia of erythematous area. Normal ruggae of vaginal walls.  Cervix is non erythematous and non-friable. There is an ulcer like lesion at 2 o'clock on the cervical face. There is no cervical motion tenderness, masses or gross abnormalities appreciated during bimanual exam.    12/04/18: Glucose 164 07/23/19: Glucose > 600, A1C > 15     Assessment & Plan    Problem List Items Addressed This Visit      Unprioritized   Diabetes mellitus with hyperglycemia ([place])    Patient A1C > 15 and CBG > 600. No n/v, changes in vision, confusion. Complains of polydipsia and polyuria. Urine with >1,000 glucose.  Started patient on Tresiba (Sample) at 10 units. Instructions to increase by 1 unit daily  if her CBG is > 100. Given hand outs on diabetes and CBG monitoring. Also given worksheet to track CBGS over the weekend.  Unable to get labs as lab was closed. Patient to come back on Monday 9/20 for lab appointment to collect CMP.  Follow up appointment next week for further diabetic assessment.       Relevant Medications   insulin degludec (TRESIBA FLEXTOUCH) 100 UNIT/ML SOPN FlexTouch Pen   Vulvovaginal candidiasis    Large intertriginous erythematous patch with scaly borders and some satelite lesions with maceration between gluteal folds. Appears to be yeast like. Can also consider perianal stretococal celluitis. No yeast on wet prep; however, given substantial infection will give PO  - nystatin cream + powder to the pharmacy. Patient reported some pain with cream previously.  - diflucan x 1 tablet      Relevant Medications   nystatin (NYSTATIN) powder   nystatin cream (MYCOSTATIN)    Other Visit Diagnoses    Dysuria    -  Primary   Relevant Orders   POCT urinalysis dipstick (Completed)   Vaginal itching       Relevant Orders   POCT Wet Prep [facility]) (Completed)   Cytology - PAP([facility])   Polyuria       Relevant Orders   POCT glycosylated hemoglobin (Hb A1C) (Completed)   Glucose (CBG) (Completed)   Colon cancer screening       Relevant Orders   Ambulatory referral to Gastroenterology     Apolonio Schneiders E.  Maudie Mercury, M.D.  PGY-2  Family Medicine  [phone] 07/23/2019 10:50 AM

## 2019-07-20 NOTE — Patient Instructions (Addendum)
Dear Sonya Walton,   It was good to see you! Thank you for taking your time to come in to be seen. Today, we discussed the following:   Yeast Infection   I am sending diflucan to the pharmacy, this is a tablet that you will take once.   I am also sending an antifungal topical cream and powder  If there is no improvement, please call to be seen again.   Diabetes   Your A1C was > 15, which shows severe hyperglycemia. Your sugar was over 600.   If you feel confused, tired, lethargic, please go straight to the ED or call 911  I am sending diabetes kit to your pharmacy so that you can monitor your sugars over the weekend.   Also starting you on insulin for the weekend.   You will find a lot of attached materials about how to manage your high blood sugar.   Please follow up on Monday, September 21 for lab work  Be well,   Zettie Cooley, M.D   [facility] [place] [phone]  *Sign up for MyChart for instant access to your health profile, labs, orders, upcoming appointments or to contact your provider with questions*  ===================================================================================  Vaginal Yeast infection, Adult  Vaginal yeast infection is a condition that causes vaginal discharge as well as soreness, swelling, and redness (inflammation) of the vagina. This is a common condition. Some women get this infection frequently. What are the causes? This condition is caused by a change in the normal balance of the yeast (candida) and bacteria that live in the vagina. This change causes an overgrowth of yeast, which causes the inflammation. What increases the risk? The condition is more likely to develop in women who:  Take antibiotic medicines.  Have diabetes.  Take birth control pills.  Are pregnant.  Douche often.  Have a weak body defense system (immune system).  Have been taking steroid medicines for a long time.  Frequently wear tight  clothing. What are the signs or symptoms? Symptoms of this condition include:  White, thick, creamy vaginal discharge.  Swelling, itching, redness, and irritation of the vagina. The lips of the vagina (vulva) may be affected as well.  Pain or a burning feeling while urinating.  Pain during sex. How is this diagnosed? This condition is diagnosed based on:  Your medical history.  A physical exam.  A pelvic exam. Your health care provider will examine a sample of your vaginal discharge under a microscope. Your health care provider may send this sample for testing to confirm the diagnosis. How is this treated? This condition is treated with medicine. Medicines may be over-the-counter or prescription. You may be told to use one or more of the following:  Medicine that is taken by mouth (orally).  Medicine that is applied as a cream (topically).  Medicine that is inserted directly into the vagina (suppository). Follow these instructions at home:  Lifestyle  Do not have sex until your health care provider approves. Tell your sex partner that you have a yeast infection. That person should go to his or her health care provider and ask if they should also be treated.  Do not wear tight clothes, such as pantyhose or tight pants.  Wear breathable cotton underwear. General instructions  Take or apply over-the-counter and prescription medicines only as told by your health care provider.  Eat more yogurt. This may help to keep your yeast infection from returning.  Do not use tampons until your  health care provider approves.  Try taking a sitz bath to help with discomfort. This is a warm water bath that is taken while you are sitting down. The water should only come up to your hips and should cover your buttocks. Do this 3-4 times per day or as told by your health care provider.  Do not douche.  If you have diabetes, keep your blood sugar levels under control.  Keep all follow-up  visits as told by your health care provider. This is important. Contact a health care provider if:  You have a fever.  Your symptoms go away and then return.  Your symptoms do not get better with treatment.  Your symptoms get worse.  You have new symptoms.  You develop blisters in or around your vagina.  You have blood coming from your vagina and it is not your menstrual period.  You develop pain in your abdomen. Summary  Vaginal yeast infection is a condition that causes discharge as well as soreness, swelling, and redness (inflammation) of the vagina.  This condition is treated with medicine. Medicines may be over-the-counter or prescription.  Take or apply over-the-counter and prescription medicines only as told by your health care provider.  Do not douche. Do not have sex or use tampons until your health care provider approves.  Contact a health care provider if your symptoms do not get better with treatment or your symptoms go away and then return. This information is not intended to replace advice given to you by your health care provider. Make sure you discuss any questions you have with your health care provider. Document Released: 07/28/2005 Document Revised: 03/06/2018 Document Reviewed: 03/06/2018 Elsevier Patient Education  [place].  Skin Yeast Infection  A skin yeast infection is a condition in which there is an overgrowth of yeast (candida) that normally lives on the skin. This condition usually occurs in areas of the skin that are constantly warm and moist, such as the armpits or the groin. What are the causes? This condition is caused by a change in the normal balance of the yeast and bacteria that live on the skin. What increases the risk? You are more likely to develop this condition if you:  Are obese.  Are pregnant.  Take birth control pills.  Have diabetes.  Take antibiotic medicines.  Take steroid medicines.  Are  malnourished.  Have a weak body defense system (immune system).  Are 46 years of age or older.  Wear tight clothing. What are the signs or symptoms? The most common symptom of this condition is itchiness in the affected area. Other symptoms include:  Red, swollen area of the skin.  Bumps on the skin. How is this diagnosed?  This condition is diagnosed with a medical history and physical exam.  Your health care provider may check for yeast by taking light scrapings of the skin to be viewed under a microscope. How is this treated? This condition is treated with medicine. Medicines may be prescribed or be available over the counter. The medicines may be:  Taken by mouth (orally).  Applied as a cream or powder to your skin. Follow these instructions at home:   Take or apply over-the-counter and prescription medicines only as told by your health care provider.  Maintain a healthy weight. If you need help losing weight, talk with your health care provider.  Keep your skin clean and dry.  If you have diabetes, keep your blood sugar under control.  Keep all follow-up  visits as told by your health care provider. This is important. Contact a health care provider if:  Your symptoms go away and then return.  Your symptoms do not get better with treatment.  Your symptoms get worse.  Your rash spreads.  You have a fever or chills.  You have new symptoms.  You have new warmth or redness of your skin. Summary  A skin yeast infection is a condition in which there is an overgrowth of yeast (candida) that normally lives on the skin. This condition is caused by a change in the normal balance of the yeast and bacteria that live on the skin.  Take or apply over-the-counter and prescription medicines only as told by your health care provider.  Keep your skin clean and dry.  Contact a health care provider if your symptoms do not get better with treatment. This information is not  intended to replace advice given to you by your health care provider. Make sure you discuss any questions you have with your health care provider. Document Released: 07/06/2011 Document Revised: 03/07/2018 Document Reviewed: 03/07/2018 Elsevier Patient Education  2020 [place].  Vaginal Yeast infection, Adult  Vaginal yeast infection is a condition that causes vaginal discharge as well as soreness, swelling, and redness (inflammation) of the vagina. This is a common condition. Some women get this infection frequently. What are the causes? This condition is caused by a change in the normal balance of the yeast (candida) and bacteria that live in the vagina. This change causes an overgrowth of yeast, which causes the inflammation. What increases the risk? The condition is more likely to develop in women who:  Take antibiotic medicines.  Have diabetes.  Take birth control pills.  Are pregnant.  Douche often.  Have a weak body defense system (immune system).  Have been taking steroid medicines for a long time.  Frequently wear tight clothing. What are the signs or symptoms? Symptoms of this condition include:  White, thick, creamy vaginal discharge.  Swelling, itching, redness, and irritation of the vagina. The lips of the vagina (vulva) may be affected as well.  Pain or a burning feeling while urinating.  Pain during sex. How is this diagnosed? This condition is diagnosed based on:  Your medical history.  A physical exam.  A pelvic exam. Your health care provider will examine a sample of your vaginal discharge under a microscope. Your health care provider may send this sample for testing to confirm the diagnosis. How is this treated? This condition is treated with medicine. Medicines may be over-the-counter or prescription. You may be told to use one or more of the following:  Medicine that is taken by mouth (orally).  Medicine that is applied as a cream  (topically).  Medicine that is inserted directly into the vagina (suppository). Follow these instructions at home:  Lifestyle  Do not have sex until your health care provider approves. Tell your sex partner that you have a yeast infection. That person should go to his or her health care provider and ask if they should also be treated.  Do not wear tight clothes, such as pantyhose or tight pants.  Wear breathable cotton underwear. General instructions  Take or apply over-the-counter and prescription medicines only as told by your health care provider.  Eat more yogurt. This may help to keep your yeast infection from returning.  Do not use tampons until your health care provider approves.  Try taking a sitz bath to help with discomfort. This is  a warm water bath that is taken while you are sitting down. The water should only come up to your hips and should cover your buttocks. Do this 3-4 times per day or as told by your health care provider.  Do not douche.  If you have diabetes, keep your blood sugar levels under control.  Keep all follow-up visits as told by your health care provider. This is important. Contact a health care provider if:  You have a fever.  Your symptoms go away and then return.  Your symptoms do not get better with treatment.  Your symptoms get worse.  You have new symptoms.  You develop blisters in or around your vagina.  You have blood coming from your vagina and it is not your menstrual period.  You develop pain in your abdomen. Summary  Vaginal yeast infection is a condition that causes discharge as well as soreness, swelling, and redness (inflammation) of the vagina.  This condition is treated with medicine. Medicines may be over-the-counter or prescription.  Take or apply over-the-counter and prescription medicines only as told by your health care provider.  Do not douche. Do not have sex or use tampons until your health care provider  approves.  Contact a health care provider if your symptoms do not get better with treatment or your symptoms go away and then return. This information is not intended to replace advice given to you by your health care provider. Make sure you discuss any questions you have with your health care provider. Document Released: 07/28/2005 Document Revised: 03/06/2018 Document Reviewed: 03/06/2018 Elsevier Patient Education  [place].  Skin Yeast Infection  A skin yeast infection is a condition in which there is an overgrowth of yeast (candida) that normally lives on the skin. This condition usually occurs in areas of the skin that are constantly warm and moist, such as the armpits or the groin. What are the causes? This condition is caused by a change in the normal balance of the yeast and bacteria that live on the skin. What increases the risk? You are more likely to develop this condition if you:  Are obese.  Are pregnant.  Take birth control pills.  Have diabetes.  Take antibiotic medicines.  Take steroid medicines.  Are malnourished.  Have a weak body defense system (immune system).  Are 83 years of age or older.  Wear tight clothing. What are the signs or symptoms? The most common symptom of this condition is itchiness in the affected area. Other symptoms include:  Red, swollen area of the skin.  Bumps on the skin. How is this diagnosed?  This condition is diagnosed with a medical history and physical exam.  Your health care provider may check for yeast by taking light scrapings of the skin to be viewed under a microscope. How is this treated? This condition is treated with medicine. Medicines may be prescribed or be available over the counter. The medicines may be:  Taken by mouth (orally).  Applied as a cream or powder to your skin. Follow these instructions at home:   Take or apply over-the-counter and prescription medicines only as told by your  health care provider.  Maintain a healthy weight. If you need help losing weight, talk with your health care provider.  Keep your skin clean and dry.  If you have diabetes, keep your blood sugar under control.  Keep all follow-up visits as told by your health care provider. This is important. Contact a health care provider  if:  Your symptoms go away and then return.  Your symptoms do not get better with treatment.  Your symptoms get worse.  Your rash spreads.  You have a fever or chills.  You have new symptoms.  You have new warmth or redness of your skin. Summary  A skin yeast infection is a condition in which there is an overgrowth of yeast (candida) that normally lives on the skin. This condition is caused by a change in the normal balance of the yeast and bacteria that live on the skin.  Take or apply over-the-counter and prescription medicines only as told by your health care provider.  Keep your skin clean and dry.  Contact a health care provider if your symptoms do not get better with treatment. This information is not intended to replace advice given to you by your health care provider. Make sure you discuss any questions you have with your health care provider. Document Released: 07/06/2011 Document Revised: 03/07/2018 Document Reviewed: 03/07/2018 Elsevier Patient Education  [place].  Blood Glucose Monitoring, Adult Monitoring your blood sugar (glucose) is an important part of managing your diabetes (diabetes mellitus). Blood glucose monitoring involves checking your blood glucose as often as directed and keeping a record (log) of your results over time. Checking your blood glucose regularly and keeping a blood glucose log can:  Help you and your health care provider adjust your diabetes management plan as needed, including your medicines or insulin.  Help you understand how food, exercise, illnesses, and medicines affect your blood glucose.  Let you  know what your blood glucose is at any time. You can quickly find out if you have low blood glucose (hypoglycemia) or high blood glucose (hyperglycemia). Your health care provider will set individualized treatment goals for you. Your goals will be based on your age, other medical conditions you have, and how you respond to diabetes treatment. Generally, the goal of treatment is to maintain the following blood glucose levels:  Before meals (preprandial): 80-130 mg/dL (4.4-7.2 mmol/L).  After meals (postprandial): below 180 mg/dL (10 mmol/L).  A1c level: less than 7%. Supplies needed:  Blood glucose meter.  Test strips for your meter. Each meter has its own strips. You must use the strips that came with your meter.  A needle to prick your finger (lancet). Do not use a lancet more than one time.  A device that holds the lancet (lancing device).  A journal or log book to write down your results. How to check your blood glucose  1. Wash your hands with soap and water. 2. Prick the side of your finger (not the tip) with the lancet. Use a different finger each time. 3. Gently rub the finger until a small drop of blood appears. 4. Follow instructions that come with your meter for inserting the test strip, applying blood to the strip, and using your blood glucose meter. 5. Write down your result and any notes. Some meters allow you to use areas of your body other than your finger (alternative sites) to test your blood. The most common alternative sites are:  Forearm.  Thigh.  Palm of the hand. If you think you may have hypoglycemia, or if you have a history of not knowing when your blood glucose is getting low (hypoglycemia unawareness), do not use alternative sites. Use your finger instead. Alternative sites may not be as accurate as the fingers, because blood flow is slower in these areas. This means that the result you get may  be delayed, and it may be different from the result that you  would get from your finger. Follow these instructions at home: Blood glucose log   Every time you check your blood glucose, write down your result. Also write down any notes about things that may be affecting your blood glucose, such as your diet and exercise for the day. This information can help you and your health care provider: ? Look for patterns in your blood glucose over time. ? Adjust your diabetes management plan as needed.  Check if your meter allows you to download your records to a computer. Most glucose meters store a record of glucose readings in the meter. If you have type 1 diabetes:  Check your blood glucose 2 or more times a day.  Also check your blood glucose: ? Before every insulin injection. ? Before and after exercise. ? Before meals. ? 2 hours after a meal. ? Occasionally between 2:00 a.m. and 3:00 a.m., as directed. ? Before potentially dangerous tasks, like driving or using heavy machinery. ? At bedtime.  You may need to check your blood glucose more often, up to 6-10 times a day, if you: ? Use an insulin pump. ? Need multiple daily injections (MDI). ? Have diabetes that is not well-controlled. ? Are ill. ? Have a history of severe hypoglycemia. ? Have hypoglycemia unawareness. If you have type 2 diabetes:  If you take insulin or other diabetes medicines, check your blood glucose 2 or more times a day.  If you are on intensive insulin therapy, check your blood glucose 4 or more times a day. Occasionally, you may also need to check between 2:00 a.m. and 3:00 a.m., as directed.  Also check your blood glucose: ? Before and after exercise. ? Before potentially dangerous tasks, like driving or using heavy machinery.  You may need to check your blood glucose more often if: ? Your medicine is being adjusted. ? Your diabetes is not well-controlled. ? You are ill. General tips  Always keep your supplies with you.  If you have questions or need help, all  blood glucose meters have a 24-hour "hotline" phone number that you can call. You may also contact your health care provider.  After you use a few boxes of test strips, adjust (calibrate) your blood glucose meter by following instructions that came with your meter. Contact a health care provider if:  Your blood glucose is at or above 240 mg/dL (13.3 mmol/L) for 2 days in a row.  You have been sick or have had a fever for 2 days or longer, and you are not getting better.  You have any of the following problems for more than 6 hours: ? You cannot eat or drink. ? You have nausea or vomiting. ? You have diarrhea. Get help right away if:  Your blood glucose is lower than 54 mg/dL (3 mmol/L).  You become confused or you have trouble thinking clearly.  You have difficulty breathing.  You have moderate or large ketone levels in your urine. Summary  Monitoring your blood sugar (glucose) is an important part of managing your diabetes (diabetes mellitus).  Blood glucose monitoring involves checking your blood glucose as often as directed and keeping a record (log) of your results over time.  Your health care provider will set individualized treatment goals for you. Your goals will be based on your age, other medical conditions you have, and how you respond to diabetes treatment.  Every time you check your blood  glucose, write down your result. Also write down any notes about things that may be affecting your blood glucose, such as your diet and exercise for the day. This information is not intended to replace advice given to you by your health care provider. Make sure you discuss any questions you have with your health care provider. Document Released: 10/21/2003 Document Revised: 08/11/2018 Document Reviewed: 03/29/2016 Elsevier Patient Education  [place].  Diabetes Mellitus and Nutrition, Adult When you have diabetes (diabetes mellitus), it is very important to have healthy  eating habits because your blood sugar (glucose) levels are greatly affected by what you eat and drink. Eating healthy foods in the appropriate amounts, at about the same times every day, can help you:  Control your blood glucose.  Lower your risk of heart disease.  Improve your blood pressure.  Reach or maintain a healthy weight. Every person with diabetes is different, and each person has different needs for a meal plan. Your health care provider may recommend that you work with a diet and nutrition specialist (dietitian) to make a meal plan that is best for you. Your meal plan may vary depending on factors such as:  The calories you need.  The medicines you take.  Your weight.  Your blood glucose, blood pressure, and cholesterol levels.  Your activity level.  Other health conditions you have, such as heart or kidney disease. How do carbohydrates affect me? Carbohydrates, also called carbs, affect your blood glucose level more than any other type of food. Eating carbs naturally raises the amount of glucose in your blood. Carb counting is a method for keeping track of how many carbs you eat. Counting carbs is important to keep your blood glucose at a healthy level, especially if you use insulin or take certain oral diabetes medicines. It is important to know how many carbs you can safely have in each meal. This is different for every person. Your dietitian can help you calculate how many carbs you should have at each meal and for each snack. Foods that contain carbs include:  Bread, cereal, rice, pasta, and crackers.  Potatoes and corn.  Peas, beans, and lentils.  Milk and yogurt.  Fruit and juice.  Desserts, such as cakes, cookies, ice cream, and candy. How does alcohol affect me? Alcohol can cause a sudden decrease in blood glucose (hypoglycemia), especially if you use insulin or take certain oral diabetes medicines. Hypoglycemia can be a life-threatening condition. Symptoms  of hypoglycemia (sleepiness, dizziness, and confusion) are similar to symptoms of having too much alcohol. If your health care provider says that alcohol is safe for you, follow these guidelines:  Limit alcohol intake to no more than 1 drink per day for nonpregnant women and 2 drinks per day for men. One drink equals 12 oz of beer, 5 oz of wine, or 1 oz of hard liquor.  Do not drink on an empty stomach.  Keep yourself hydrated with water, diet soda, or unsweetened iced tea.  Keep in mind that regular soda, juice, and other mixers may contain a lot of sugar and must be counted as carbs. What are tips for following this plan?  Reading food labels  Start by checking the serving size on the "Nutrition Facts" label of packaged foods and drinks. The amount of calories, carbs, fats, and other nutrients listed on the label is based on one serving of the item. Many items contain more than one serving per package.  Check the total grams (g)  of carbs in one serving. You can calculate the number of servings of carbs in one serving by dividing the total carbs by 15. For example, if a food has 30 g of total carbs, it would be equal to 2 servings of carbs.  Check the number of grams (g) of saturated and trans fats in one serving. Choose foods that have low or no amount of these fats.  Check the number of milligrams (mg) of salt (sodium) in one serving. Most people should limit total sodium intake to less than 2,300 mg per day.  Always check the nutrition information of foods labeled as "low-fat" or "nonfat". These foods may be higher in added sugar or refined carbs and should be avoided.  Talk to your dietitian to identify your daily goals for nutrients listed on the label. Shopping  Avoid buying canned, premade, or processed foods. These foods tend to be high in fat, sodium, and added sugar.  Shop around the outside edge of the grocery store. This includes fresh fruits and vegetables, bulk grains,  fresh meats, and fresh dairy. Cooking  Use low-heat cooking methods, such as baking, instead of high-heat cooking methods like deep frying.  Cook using healthy oils, such as olive, canola, or sunflower oil.  Avoid cooking with butter, cream, or high-fat meats. Meal planning  Eat meals and snacks regularly, preferably at the same times every day. Avoid going long periods of time without eating.  Eat foods high in fiber, such as fresh fruits, vegetables, beans, and whole grains. Talk to your dietitian about how many servings of carbs you can eat at each meal.  Eat 4-6 ounces (oz) of lean protein each day, such as lean meat, chicken, fish, eggs, or tofu. One oz of lean protein is equal to: ? 1 oz of meat, chicken, or fish. ? 1 egg. ?  cup of tofu.  Eat some foods each day that contain healthy fats, such as avocado, nuts, seeds, and fish. Lifestyle  Check your blood glucose regularly.  Exercise regularly as told by your health care provider. This may include: ? 150 minutes of moderate-intensity or vigorous-intensity exercise each week. This could be brisk walking, biking, or water aerobics. ? Stretching and doing strength exercises, such as yoga or weightlifting, at least 2 times a week.  Take medicines as told by your health care provider.  Do not use any products that contain nicotine or tobacco, such as cigarettes and e-cigarettes. If you need help quitting, ask your health care provider.  Work with a Social worker or diabetes educator to identify strategies to manage stress and any emotional and social challenges. Questions to ask a health care provider  Do I need to meet with a diabetes educator?  Do I need to meet with a dietitian?  What number can I call if I have questions?  When are the best times to check my blood glucose? Where to find more information:  American Diabetes Association: diabetes.org  Academy of Nutrition and Dietetics: www.eatright.[employer] of Diabetes and Digestive and Kidney Diseases (NIH): [employer] Summary  A healthy meal plan will help you control your blood glucose and maintain a healthy lifestyle.  Working with a diet and nutrition specialist (dietitian) can help you make a meal plan that is best for you.  Keep in mind that carbohydrates (carbs) and alcohol have immediate effects on your blood glucose levels. It is important to count carbs and to use alcohol carefully. This information is not intended to  replace advice given to you by your health care provider. Make sure you discuss any questions you have with your health care provider. Document Released: 07/15/2005 Document Revised: 09/30/2017 Document Reviewed: 11/22/2016 Elsevier Patient Education  2020 [place]. Daily Diabetes Record Check your blood glucose (BG) as directed by your health care provider. Use this form to record your BG results as well as any diabetes medicines that you take, including insulin. Bringing a record of your BG results and a list of your current medicines to your health care provider is very helpful in managing your diabetes. These numbers help your health care provider to know whether your diabetes management plan needs to be changed. Patient name: ____________________________________ Week of ____________________ Daily BG results and diabetes medicines Date: _________  Breakfast - BG / Medicines: ________________ / __________________________________________________________  Lunch - BG / Medicines: ___________________ / __________________________________________________________  Dinner - BG / Medicines: __________________ / __________________________________________________________  Bedtime - BG / Medicines: ________________ / ___________________________________________________________ Date: _________  Breakfast - BG / Medicines: ________________ / __________________________________________________________  Lunch  - BG / Medicines: ___________________ / __________________________________________________________  Dinner - BG / Medicines: __________________ / __________________________________________________________  Bedtime - BG / Medicines: ________________ / ___________________________________________________________ Date: _________  Breakfast - BG / Medicines: ________________ / __________________________________________________________  Lunch - BG / Medicines: ___________________ / __________________________________________________________  Dinner - BG / Medicines: __________________ / __________________________________________________________  Bedtime - BG / Medicines: ________________ / ___________________________________________________________ Date: _________  Breakfast - BG / Medicines: ________________ / __________________________________________________________  Lunch - BG / Medicines: ___________________ / __________________________________________________________  Dinner - BG / Medicines: __________________ / __________________________________________________________  Bedtime - BG / Medicines: ________________ / ___________________________________________________________ Date: _________  Breakfast - BG / Medicines: ________________ / __________________________________________________________  Lunch - BG / Medicines: ___________________ / __________________________________________________________  Dinner - BG / Medicines: __________________ / __________________________________________________________  Bedtime - BG / Medicines: ________________ / ___________________________________________________________ Date: _________  Breakfast - BG / Medicines: ________________ / __________________________________________________________  Lunch - BG / Medicines: ___________________ / __________________________________________________________  Dinner - BG / Medicines: __________________ /  __________________________________________________________  Bedtime - BG / Medicines: ________________ / ___________________________________________________________ Date: _________  Breakfast - BG / Medicines: ________________ / __________________________________________________________  Lunch - BG / Medicines: ___________________ / __________________________________________________________  Dinner - BG / Medicines: __________________ / __________________________________________________________  Bedtime - BG / Medicines: ________________ / ___________________________________________________________ Notes: ______________________________________________________________________________________________________________________ This information is not intended to replace advice given to you by your health care provider. Make sure you discuss any questions you have with your health care provider. Document Released: 09/21/2004 Document Revised: 08/01/2018 Document Reviewed: 07/16/2016 Elsevier Patient Education  2020 [place] Following a diabetes action plan is a way for you to manage your diabetes (diabetes mellitus) symptoms. The plan is color-coded to help you understand what actions you need to take based on any symptoms you are having.  If you have symptoms in the red zone, you need medical care right away.  If you have symptoms in the yellow zone, it means you are having problems.  If you have symptoms in the green zone, you are doing well. Learning about and understanding diabetes can take time. Follow the plan that you develop with your health care provider. Know the target range for your blood sugar (glucose) level, and review your treatment plan with your health care provider at each visit. The target range for my blood sugar level is __________________________ mg/dL. Red zone Get medical help right away if you have any of the following symptoms:  A blood  sugar test result that is below 54 mg/dL (3 mmol/L).  A blood sugar test result that is at or above 240 mg/dL (13.3 mmol/L) for 2 days in a row.  Confusion or trouble thinking clearly.  Difficulty breathing.  Sickness or a fever for 2 or more days that is not getting better.  Moderate or large ketone levels in your urine. If you have any red zone symptoms, call emergency services (911 in the [place] or go to the nearest emergency room. If you have severely low blood sugar (severe hypoglycemia) and you cannot eat or drink, you may need an injection of glucagon. Make sure a family member or close friend knows how to check your blood sugar and how to give you a glucagon injection. You may need to be treated in a hospital for this condition. Yellow zone If you have any of the following symptoms, your diabetes is not under control and you may need to make some changes:  Blood sugar test results that are below 70 mg/dL (3.9 mmol/L).  Other symptoms of hypoglycemia, such as: ? Shaking or feeling light-headed. ? Confusion or irritability. ? Feeling hungry. ? Having a fast heartbeat.  A blood sugar test result that is higher than 240 mg/dL (13.3 mmol/L) for 2 days in a row.  A fever.  Feeling tired, or not having any energy. If you have any yellow zone symptoms:  Treat your low blood sugar (hypoglycemia) by eating or drinking 15 grams of a rapid-acting carbohydrate. Follow the 15:15 rule: ? Take 15 grams of a rapid-acting carbohydrate, such as: ? 1 tube of glucose gel. ? 3 glucose pills. ? 6-8 pieces of hard candy. ? 4 oz (120 mL) of fruit juice. ? 4 oz (120 mL) of regular (not diet) soda. ? Check your blood sugar 15 minutes after you take the carbohydrate. ? If the repeat blood sugar test is still at or below 70 mg/dL (3.9 mmol/L), take 15 grams of a carbohydrate again. ? If your blood sugar does not increase above 70 mg/dL (3.9 mmol/L) after 3 tries, get medical help right  away. ? After your blood sugar returns to normal, eat a meal or a snack within 1 hour.  Keep taking your daily medicines as directed.  Check your blood sugar more often than you normally would. ? Write down your results. ? Call your health care provider if you have trouble keeping your blood sugar in your target range.  Green zone These signs mean you are doing well and you can continue what you are doing to manage your diabetes:  Your blood sugar is within your personal target range. For most people, a blood sugar level before a meal (preprandial) should be 80-130 mg/dL.  You feel well, and you are able to do daily activities. If you are in the green zone, continue to manage your diabetes as directed. To do this:  Eat a healthy diet.  Exercise regularly.  Check your blood sugar as directed.  Take your medicines as directed.  Where to find more information You can find more information about diabetes from:  American Diabetes Association (ADA): www.diabetes.org  American Association of Diabetes Educators (AADE): www.diabeteseducator.org Summary  Following a diabetes action plan is a way for you to manage your diabetes symptoms. The plan is color-coded to help you understand what actions you need to take based on any symptoms you are having.  Follow the plan that you develop with your health care provider. Make sure you know  your personal target blood sugar level.  Review your treatment plan with your health care provider at each visit. This information is not intended to replace advice given to you by your health care provider. Make sure you discuss any questions you have with your health care provider. Document Released: 08/10/2017 Document Revised: 12/16/2017 Document Reviewed: 08/10/2017 Elsevier Patient Education  2020 [place].  Insulin Treatment for Diabetes Mellitus Diabetes (diabetes mellitus) is a long-term (chronic) disease. It occurs when the body does not  properly use sugar (glucose) that is released from food after digestion. Glucose levels are controlled by a hormone called insulin. Insulin is made in the pancreas, which is an organ behind the stomach.  If you have type 1 diabetes, you must take insulin because your pancreas does not make any.  If you have type 2 diabetes, you might need to take insulin along with other medicines. In type 2 diabetes, one or both of these problems may be present: ? The pancreas does not make enough insulin. ? Cells in the body do not respond properly to insulin that the body makes (insulin resistance). You must use insulin correctly to control your diabetes. You must have some insulin in your body at all times. Insulin treatment varies depending on your type of diabetes, your treatment goals, and your medical history. Ask questions to understand your insulin treatment plan so you can be an active partner in managing your diabetes. How is insulin given? Insulin can only be given through a shot (injection). It is injected using a syringe and needle, an insulin pen, a pump, or a jet injector. Your health care provider will:  Prescribe the type and amount of insulin that you need.  Tell you when you should inject your insulin. Where on the body should insulin be injected? Insulin is injected into a layer of fatty tissue under the skin. Good places to inject insulin include:  Abdomen. Generally, the abdomen is the best place to inject insulin. However, you should avoid any area that is less than 2 inches (5 cm) from the belly button (navel).  Front of thigh.  Upper, outer side of thigh.  Upper, outer side of arm.  Upper, outer part of buttock. It is important to:  Give your injection in a slightly different place each time. This helps to prevent irritation and improve absorption.  Avoid injecting into areas that have scar tissue. Usually, you will give yourself insulin injections. Others can also be taught  how to give you injections. You will use a special type of syringe that is made only for insulin. Some people may have an insulin pump that delivers insulin steadily through a tube (cannula) that is placed under the skin. What are the different types of insulin? The following information is a general guide to different types of insulin. Specifics vary depending on the insulin product that your health care provider prescribes.  Rapid-acting insulin: ? Starts working quickly, in as little as 5 minutes. ? Can last for 4-6 hours (or sometimes longer). ? Works well when taken right before a meal to quickly lower your blood glucose.  Short-acting insulin: ? Starts working in about 30 minutes. ? Can last for 6-10 hours. ? Should be taken about 30 minutes before you start eating a meal.  Intermediate-acting insulin: ? Starts working in 1-2 hours. ? Lasts for about 10-18 hours. ? Lowers your blood glucose for a longer period of time, but it is not as effective for lowering blood glucose  right after a meal.  Long-acting insulin: ? Mimics the small amount of insulin that your pancreas usually produces throughout the day. ? Should be used one or two times a day. ? Is usually used in combination with other types of insulin or other medicines.  Concentrated insulin, or U-500 insulin: ? Contains a higher dose of insulin than most rapid-acting insulins. U-500 insulin has 5 times the amount of insulin per 1 mL. ? Should only be used with the special U-500 syringe or U-500 insulin pen. It is dangerous to use the wrong type of syringe with this insulin. What are the side effects of insulin? Possible side effects of insulin treatment include:  Low blood glucose (hypoglycemia).  Weight gain.  High blood glucose (hyperglycemia).  Skin injury or irritation. Some of these side effects can be caused by using improper injection technique. Be sure to learn how to inject insulin properly. What are common  terms associated with insulin treatment? Some terms that you might hear include:  Basal insulin, or basal rate. This is the constant amount of insulin that needs to be present in your body to keep your blood glucose levels stable. People who have type 1 diabetes need basal insulin in a steady (continuous) dose 24 hours a day. ? Usually, intermediate-acting or long-acting insulin is used one or two times a day to manage basal insulin levels. ? Medicines that are taken by mouth may also be recommended to manage basal insulin levels.  Prandial or nutrition insulin. This refers to meal-related insulin. ? Blood glucose rises quickly after a meal (postprandial). Rapid-acting or short-acting insulin can be used right before a meal (preprandial) to quickly lower your blood glucose. ? You may be instructed to adjust the amount of prandial insulin that you take, based on how much carbohydrate (starch) is in your meal.  Corrective insulin. This may also be called a correction dose or supplemental dose. This is a small amount of rapid-acting or short-acting insulin that can be used to lower your blood glucose if it is too high. You may be instructed to check your blood glucose at certain times of the day and use corrective insulin as needed.  Tight control, or intensive therapy. This means keeping your blood glucose as close to your target as possible, and preventing your blood glucose from getting too high after meals. People who have tight control of their diabetes have fewer long-term problems caused by diabetes. Follow these instructions at home: Talk with your health care provider or pharmacist about the type of insulin you should take and when you should take it. You should know when your insulin goes up the most (peaks) and when it wears off. You need this information so you can plan your meals and exercise. Work with your health care provider to:  Check your blood glucose every day. Your health care  provider will tell you how often and when you should do this.  Manage your: ? Weight. ? Blood pressure. ? Cholesterol. ? Stress.  Eat a healthy diet.  Exercise regularly. Summary  Diabetes is a long-term (chronic) disease. It occurs when the body does not properly use sugar (glucose) that is released from food after digestion. Glucose levels are controlled by a hormone called insulin, which is made in an organ behind your stomach (pancreas).  You must use insulin correctly to control your diabetes. You must have some insulin in your body at all times.  Insulin treatment varies depending on your type of diabetes,  your treatment goals, and your medical history.  Talk with your health care provider or pharmacist about the type of insulin you should take and when you should take it.  Check your blood glucose every day. Your health care provider will tell you how often and when you should check it. This information is not intended to replace advice given to you by your health care provider. Make sure you discuss any questions you have with your health care provider. Document Released: 01/14/2009 Document Revised: 10/21/2017 Document Reviewed: 11/21/2015 Elsevier Patient Education  [place].  Insulin Storage and Care  All insulin pens and bottles (vials) have expiration dates. Insulin pens and vials that are refrigerated and unopened are good until the expiration date. Once opened, vials are good for 28 days. "Opened" means that the rubber has been punctured. Once in use, pen expiration dates vary depending on the type of insulin being used. Do not use insulin after the expiration date. Always follow the instructions that come with your insulin. How to store your insulin  Store your insulin according to instructions on the packaging.  If insulin is kept at room temperature, keep the temperature between 56-52F (13-27C). ? Opened insulin vials may be kept at room temperature, or  in the refrigerator and warmed to room temperature before use. ? Opened insulin pens should be kept at room temperature.  If insulin is kept in the refrigerator, keep the temperature between 36-9F (3-8C).  Do not freeze insulin.  Keep insulin away from direct heat or sunlight. How to throw away your supplies  Throw away your insulin if: ? It is discolored. ? It is thick. ? It has clumps in it. ? It has white particles suspended in it.  Discard all used needles in a puncture-proof sharps disposal container. You can ask your local pharmacy about where you can get this kind of disposal container, or you can use an empty liquid laundry detergent bottle that has a lid, for example.  Follow the disposal regulations for the area where you live.  Do not use any syringe or needle more than one time.  Throw away empty vials and pens (with needles removed) in the regular trash. General recommendations  Always keep extra insulin and supplies with you.  Always inspect your insulin prior to injecting. Do not use if you notice any discoloration, particles, or clumping.  Mix cloudy insulin by gently rolling the vial between your hands or "rocking" the pen from end to end at least 10 times.  Do not leave insulin in your vehicle or in any place where it can get too hot or too cold.  Use an insulated travel pack to store your insulin vials or pens when traveling. Where to find more information  American Diabetes Association: www.diabetes.[employer] of Diabetes and Digestive and Kidney Diseases: [employer] Summary  Do not store insulin in extreme heat or cold, such as in the freezer, direct sunlight, or your vehicle.  Check the expiration date before using insulin and do not use insulin past the expiration date.  Check insulin before using to make sure it looks normal. Mix cloudy insulin before using. Do not use insulin if it is  discolored, has particles, or clumps. This information is not intended to replace advice given to you by your health care provider. Make sure you discuss any questions you have with your health care provider. Document Released: 08/15/2009 Document Revised: 11/25/2016 Document Reviewed: 11/25/2016 Elsevier Patient Education  2020 Elsevier  Inc.

## 2019-07-23 ENCOUNTER — Ambulatory Visit (INDEPENDENT_AMBULATORY_CARE_PROVIDER_SITE_OTHER): Payer: Self-pay | Admitting: Family Medicine

## 2019-07-23 ENCOUNTER — Encounter: Payer: Self-pay | Admitting: Family Medicine

## 2019-07-23 ENCOUNTER — Other Ambulatory Visit: Payer: Self-pay

## 2019-07-23 ENCOUNTER — Telehealth: Payer: Self-pay | Admitting: Family Medicine

## 2019-07-23 VITALS — BP 118/72 | HR 66 | Wt 199.8 lb

## 2019-07-23 DIAGNOSIS — E1165 Type 2 diabetes mellitus with hyperglycemia: Secondary | ICD-10-CM

## 2019-07-23 DIAGNOSIS — Z5989 Other problems related to housing and economic circumstances: Secondary | ICD-10-CM

## 2019-07-23 DIAGNOSIS — Z598 Other problems related to housing and economic circumstances: Secondary | ICD-10-CM

## 2019-07-23 LAB — GLUCOSE, POCT (MANUAL RESULT ENTRY): POC Glucose: 342 mg/dl — AB (ref 70–99)

## 2019-07-23 MED ORDER — PEN NEEDLES 32G X 6 MM MISC: 1.0000 | Freq: Every day | 6 refills | Status: DC

## 2019-07-23 MED ORDER — TRESIBA FLEXTOUCH 100 UNIT/ML ~~LOC~~ SOPN: 15.0000 [IU] | PEN_INJECTOR | Freq: Every day | SUBCUTANEOUS | 2 refills | Status: DC

## 2019-07-23 NOTE — Assessment & Plan Note (Signed)
1. Will increase Tresiba to 15 units. Increase by 1 unit daily if morning blood sugar is greater than 140. 2. Continue to check your blood sugar daily. If you develop vision changes, feel sweaty, have chest pain, and/or nausea check your blood sugar again. These are signs that your blood sugar may be low. If it is low then drink some juice, then recheck your blood sugar in 30 minutes. 3. [facility] website - red booklet about Meal Planning and Carb Counting. 4. Go to the pharmacy and get your Flu and Pneumo shots. 5. Come back and see [place] in 2 weeks (Either with Dr. Maudie Mercury or Dr. Posey Pronto)

## 2019-07-23 NOTE — Telephone Encounter (Signed)
Patient says her insulin is too expensive and wants to know what to do because she needs to pick it up today.  Please call her back asap if you can, thanks.

## 2019-07-23 NOTE — Progress Notes (Signed)
   Subjective:    Patient ID: Sonya Walton, female    DOB: May 01, 1961, 58 y.o.   MRN: [id number]   CC: Diabetes, lab follow-up  HPI: Patient presenting today for follow-up for her diabetes, was last seen Friday, 07/20/2019 where was found her A1c was now 15%.  At this appointment patient was started on Tresiba 10 units and is to increase one unit daily as long as her morning CBG is greater than 100.  Patient brought in her daily fasting sugar log, this morning's reading was at 266 fasting (was 342 here in the clinic).  She denies any vision changes, headaches, chest pain, shortness of breath, nausea, vomiting, abdominal pain, or numbness/tingling in her hands or feet.  Smoking status reviewed  - former smoker  Review of Systems -see HPI   Objective:  BP 118/72   Pulse 66   Wt 199 lb 12.8 oz (90.6 kg)   SpO2 96%   BMI 35.39 kg/m  Vitals and nursing note reviewed  General: well nourished, in no acute distress Cardiac: RRR, clear S1 and S2, no murmurs appreciated Respiratory: CTA bilaterally, no increased work of breathing Abdomen: Bowel sounds auscultated Skin: warm and dry, no rashes noted Neuro: alert and oriented, no focal deficits   Assessment & Plan:   Diabetes mellitus with hyperglycemia ([place]) 1. Will increase Tresiba to 15 units. Increase by 1 unit daily if morning blood sugar is greater than 140. 2. Continue to check your blood sugar daily. If you develop vision changes, feel sweaty, have chest pain, and/or nausea check your blood sugar again. These are signs that your blood sugar may be low. If it is low then drink some juice, then recheck your blood sugar in 30 minutes. 3. [facility] website - red booklet about Meal Planning and Carb Counting. 4. Go to the pharmacy and get your Flu and Pneumo shots. 5. Come back and see [place] in 2 weeks (Either with Dr. Maudie Mercury or Dr. Posey Pronto)  Uninsured Patient recently lost her job in May 2020 due to [place] and is without insurance.  -Will start working on [employer] and other health care coverage/assistance. -Patient would benefit from meeting with Dr. Valentina Lucks or member of the pharmacy team to discuss other options for insulin including Basaglar or insulin from a vial (these are cheaper options but would require some education for use).  -Patient will pick up second free sample of Tresiba on 07/24/2019 -Next appt is on 08/09/2019 at 1:45pm with PCP   Return in about 2 weeks (around 08/06/2019) for Diabetes follow up.   Dr. Milus Banister [facility], PGY-2

## 2019-07-23 NOTE — Assessment & Plan Note (Signed)
Patient recently lost her job in May 2020 due to [place] and is without insurance. -Will start working on [employer] and other health care coverage/assistance. -Patient would benefit from meeting with Dr. Valentina Lucks or member of the pharmacy team to discuss other options for insulin including Basaglar or insulin from a vial (these are cheaper options but would require some education for use).  -Patient will pick up second free sample of Tresiba on 07/24/2019 -Next appt is on 08/09/2019 at 1:45pm with PCP

## 2019-07-23 NOTE — Telephone Encounter (Signed)
This patient was just started on treatment for diabetes (A1c 15). She was given a free sample of Tresiba in the office, started on 10 units, I bumped her up to 15 units today.  She lost her job in May due to [place], she is without insurance, she obviously cannot afford the $600 for [place].   Kennyth Lose: can we get her started on the path of obtaining the [place] or some other form of assistance for her meds? - Dr. Valentina Lucks: could we plan on switching her to [facility] for insulin if we have inexpensive access to that (a recommendation from Dr. McDiarmid)? I would also love for this patient to meet with you or a pharmacy team member to work on administering insulin and managing diabetes (maybe we need to switch her to insulin from a vial, which she will definitely need teaching in order to use safely).  Mare Loan and Dr. Valentina Lucks: in the mean time, and as long as no one opposes, I'm going to give her another free sample of Tresiba from the clinic until we can get her set up with some of these other options. She will swing by tomorrow 07/24/2019 to pick it up. I will let the front office staff know.  - [place]: her next appt is with you on 08/09/2019 at 1:45pm! Can you please help in coordinating her care?  I'm always open to other suggestions for her care. Thank you, team, for all of your help!  Milus Banister, [place], PGY-2 07/23/2019 2:45 PM

## 2019-07-23 NOTE — Patient Instructions (Addendum)
Thank you for coming in to see [place] today! Please see below to review our plan for today's visit:  1. Will increase Tresiba to 15 units. Increase by 1 unit daily if morning blood sugar is greater than 140. 2. Continue to check your blood sugar daily. If you develop vision changes, feel sweaty, have chest pain, and/or nausea check your blood sugar again. These are signs that your blood sugar may be low. If it is low then drink some juice, then recheck your blood sugar in 30 minutes. 3. [facility] website - red booklet about Meal Planning and Carb Counting. 4. Go to the pharmacy and get your Flu and Pneumo shots. 5. Come back and see [place] in 2 weeks (Either with Dr. Maudie Mercury or Dr. Posey Pronto)  Please call the clinic at [phone] if your symptoms worsen or you have any concerns. It was our pleasure to serve you!  Dr. Milus Banister [facility]

## 2019-07-24 ENCOUNTER — Telehealth: Payer: Self-pay | Admitting: *Deleted

## 2019-07-24 DIAGNOSIS — E1165 Type 2 diabetes mellitus with hyperglycemia: Secondary | ICD-10-CM

## 2019-07-24 LAB — COMPREHENSIVE METABOLIC PANEL
ALT: 73 IU/L — ABNORMAL HIGH (ref 0–32)
AST: 61 IU/L — ABNORMAL HIGH (ref 0–40)
Albumin/Globulin Ratio: 1.5 (ref 1.2–2.2)
Albumin: 4.1 g/dL (ref 3.8–4.9)
Alkaline Phosphatase: 141 IU/L — ABNORMAL HIGH (ref 39–117)
BUN/Creatinine Ratio: 15 (ref 9–23)
BUN: 12 mg/dL (ref 6–24)
Bilirubin Total: 0.7 mg/dL (ref 0.0–1.2)
CO2: 27 mmol/L (ref 20–29)
Calcium: 9.5 mg/dL (ref 8.7–10.2)
Chloride: 95 mmol/L — ABNORMAL LOW (ref 96–106)
Creatinine, Ser: 0.8 mg/dL (ref 0.57–1.00)
GFR calc Af Amer: 93 mL/min/{1.73_m2} (ref >59)
GFR calc non Af Amer: 81 mL/min/{1.73_m2} (ref >59)
Globulin, Total: 2.7 g/dL (ref 1.5–4.5)
Glucose: 341 mg/dL — ABNORMAL HIGH (ref 65–99)
Potassium: 4.2 mmol/L (ref 3.5–5.2)
Sodium: 136 mmol/L (ref 134–144)
Total Protein: 6.8 g/dL (ref 6.0–8.5)

## 2019-07-24 MED ORDER — TRESIBA FLEXTOUCH 100 UNIT/ML ~~LOC~~ SOPN: 15.0000 [IU] | PEN_INJECTOR | Freq: Every day | SUBCUTANEOUS | 0 refills | Status: DC

## 2019-07-24 NOTE — Telephone Encounter (Signed)
Contacted patient to discuss insulin supply and control.   States she has multiple insulin pens at this time after picking a new one today.   She was increased to 15 units of Tresiba yesterday.   Blood sugars are still in the high 200s.   I will plan to call her early next week and reassess control and need for Tresiba dose increase.   Plan to work through an indigent care source (possibly basaglar) for her long-term supply.

## 2019-07-24 NOTE — Telephone Encounter (Signed)
Pt states that she has a few more questions for Dr. Valentina Lucks.  She is requesting a call back. Christen Bame, CMA

## 2019-07-25 NOTE — Telephone Encounter (Signed)
Thank you for helping manage this Dr Ouida Sills and Dr Valentina Lucks. I have not met the patient yet. What can I do to help in the meantime before I see her in Oct?

## 2019-07-26 MED ORDER — TRESIBA FLEXTOUCH 100 UNIT/ML ~~LOC~~ SOPN: 20.0000 [IU] | PEN_INJECTOR | Freq: Every day | SUBCUTANEOUS | Status: DC

## 2019-07-26 NOTE — Telephone Encounter (Signed)
Contacted patient in follow-up of diabetes control and insulin dosing.   Patient lost her job in May (4 months ago) and has been using samples of Tresiba Insulin recently.  She reports using 15 units each morning.  Her fasting readings remain > 200.  She was instructed to increase her dose of Tresiba to 20 units.  She plans to increase the dose on 9/25.   She requested additional needles.  She has 1 full pen plus an additional ~ 200 units in her previous pen.    Drexel Iha, PharmD PGY-2 resident discussed indigent care paperwork submission for [place].  Patient plans to bring financial paperwork for submission of Basaglar and Trulicity for future initiation.   She will continue to use [place] supply until Rx Clinic visit on 10/8.   I provided an additional 4 week supply of needles for pick-up on 9/25 when she bring financial paperwork.

## 2019-07-26 NOTE — Telephone Encounter (Signed)
-----   Message from Ellwood Handler, [facility] sent at 07/24/2019  9:57 PM EDT ----- Regarding: Basaglar Just put her on my list of pts to follow with!  Looked into her a little bit. 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  She will for sure qualify for lilly cares (covers basaglar) based on how she is uninsured, lost her job due to [employer], and I'm thinking she likely has a low-income considering the circumstances.   Also, based on how she is uninsured I'm thinking she will probably qualify for [employer] too. Not sure if she needs help covering other meds as well? Doesn't look like she is on too much. I am confused though about how she is on tenoretic considering that one is expensive ........ unless she is getting a goodrx coupon or something.   Those two options come to my mind. Not sure if she also would qualify for community health and wellness like Mr. Shelby? Open to other things too if you are familiar but am thinking this is it.  Figure it may be best to chat about her options and I can give her a call on Thursday to get paperwork together for whatever option we choose!! Unless you feel strongly about one option now and would like me to get process started tomorrow..I am happy to do that as well!

## 2019-07-26 NOTE — Telephone Encounter (Signed)
Noted and agree. 

## 2019-07-30 LAB — CYTOLOGY - PAP
Diagnosis: NEGATIVE
High risk HPV: NEGATIVE
Molecular Disclaimer: 56
Molecular Disclaimer: NORMAL

## 2019-07-30 NOTE — Telephone Encounter (Signed)
Humberto Leep, thank you very much for your help!! I look forward to meeting with Demi in October.

## 2019-08-02 ENCOUNTER — Telehealth: Payer: Self-pay | Admitting: Pharmacist

## 2019-08-02 DIAGNOSIS — E1165 Type 2 diabetes mellitus with hyperglycemia: Secondary | ICD-10-CM

## 2019-08-02 MED ORDER — TRESIBA FLEXTOUCH 100 UNIT/ML ~~LOC~~ SOPN: 22.0000 [IU] | PEN_INJECTOR | Freq: Every day | SUBCUTANEOUS | Status: DC

## 2019-08-02 NOTE — Telephone Encounter (Signed)
Phone Follow-up RE DM control.    Patient reports currently taking 20 units of Tresiba.   Fasting readings reported 163 today and 211 yesterday AM.  Denies nocturia 0 or at most 1x per night.   Advised to increase Tresiba to 22 units once daily in the AM.  Communicated Goal fasting 100-150 for the next week.   Next visit with PCP on 10/8.   Follow-up at that time.

## 2019-08-02 NOTE — Telephone Encounter (Signed)
Noted and agree. 

## 2019-08-09 ENCOUNTER — Encounter: Payer: Self-pay | Admitting: Family Medicine

## 2019-08-09 ENCOUNTER — Other Ambulatory Visit: Payer: Self-pay

## 2019-08-09 ENCOUNTER — Ambulatory Visit (INDEPENDENT_AMBULATORY_CARE_PROVIDER_SITE_OTHER): Payer: Self-pay | Admitting: Family Medicine

## 2019-08-09 DIAGNOSIS — E1165 Type 2 diabetes mellitus with hyperglycemia: Secondary | ICD-10-CM

## 2019-08-09 MED ORDER — TRESIBA FLEXTOUCH 100 UNIT/ML ~~LOC~~ SOPN: 25.0000 [IU] | PEN_INJECTOR | Freq: Every day | SUBCUTANEOUS | 0 refills | Status: DC

## 2019-08-09 MED ORDER — TRESIBA FLEXTOUCH 100 UNIT/ML ~~LOC~~ SOPN: 25.0000 [IU] | PEN_INJECTOR | Freq: Every day | SUBCUTANEOUS | Status: DC

## 2019-08-09 NOTE — Assessment & Plan Note (Signed)
Overall improved control of Diabetes following recent administration of Triseba and implementing diet modification. Discussed with Dr Valentina Lucks who agreed to increase [place] to 25 units. Pt was given additional sample today in clinic. After she has been approved by [facility] card she can switch to Lantus. F/U with Dr Valentina Lucks in 2 weeks. And PCP visit in 2 months for diabetic check.

## 2019-08-09 NOTE — Patient Instructions (Signed)
Dear Ms Haig Prophet,  It was lovely to meet with you today. Today we discussed your diabetes in the clinic.  You have made good progress in managing your diabetes with your diet control. Your blood sugars have been well controlled on the [place] but on the higher side so we have decided to increase your Tresiba to 25 units. Hopefully your application to orange card will be approved soon.  Please continue to take 25 units daily.  We will switch over to Lantus just another form of long-acting insulin when you orange card is approved.  You can continue to implement dietary changes and exercise to help control your blood sugars too we discussed today.   Dr. Valentina Lucks will call you in 2 weeks time for a follow up and I will see you in clinic in 2 months time for a diabetic check.  I look forward to seeing you again.  Keep up the great work!   Best wishes, Dr. Lattie Haw

## 2019-08-09 NOTE — Progress Notes (Signed)
   Subjective:    Patient ID: Sonya Walton, female    DOB: Feb 17, 1961, 58 y.o.   MRN: [id number]   CC: Sonya Walton is a 58 year old female who has come in for a follow-up appointment  HPI: Diabetes Pt recently diagnosed with type 2 diabetes in September. Was seen in clinic for polyuria, polydipsia and fungal infection. HbA1c>15 on this visit. Seen in clinic by Dr. Ouida Sills who started patient on samples ofTresiba 10 units a.m was advised to increase by 1 unit daily until CBG was greater than 100. Now taking 22 units of Tresiba a.m as per Dr. Valentina Lucks. Fasting goal has been 100-150. Pt reports fasting glucose have been 151-242 on 22 units. Is doing well with the [place] and does not have difficulty using the insulin pen. Has been controlling her portion sizes,reduced amount of sugary foods and sodas and eating healthier meals with vegetables. Does have occasional dessert which increases her blood sugars.  Denies hypoglycemic episodes. Has had a few mild headaches but has been feeling stressed following diagnosis of DM and it has been challenging to implement these changes to her lifestyle.   Denies polyuria, polydipsia, fatigue or vision changes. Denies chest pain, shortness of breath, nausea, vomiting, abdominal pain, dysuria or change in bowel habit.   Smoking status reviewed   ROS: pertinent noted in the HPI    Past medical history, surgical, family, and social history reviewed and updated in the EMR as appropriate. Reviewed problem list.   Objective:  BP 115/62   Pulse 75   Temp 98.5 F (36.9 C) (Oral)   Wt 88.9 kg   SpO2 94%   BMI 34.72 kg/m   Vitals and nursing note reviewed  General: NAD, pleasant, able to participate in exam, in no acute distress Cardiac: RRR, S1 S2 present. normal heart sounds, no murmurs. Respiratory: CTAB, normal effort, No wheezes, rales or rhonchi Extremities: no edema or cyanosis. Skin: warm and dry, no rashes noted Neuro: alert, no obvious focal  deficits Psych: Normal affect and mood   Assessment & Plan:    Diabetes mellitus with hyperglycemia (HCC) Overall improved control of Diabetes following recent administration of Triseba and implementing diet modification. Discussed with Dr Valentina Lucks who agreed to increase [place] to 25 units. Pt was given additional sample today in clinic. After she has been approved by [facility] card she can switch to Lantus. F/U with Dr Valentina Lucks in 2 weeks. And PCP visit in 2 months for diabetic check.   Lattie Haw, MD  [place] PGY-1  Media Information    Document Information  Photos  Fasting CBGs  08/09/2019 14:42  Attached To:  Office Visit on 08/09/19 with Lattie Haw, MD  Source Information  Lattie Haw, MD  [facility] Resident

## 2019-08-10 ENCOUNTER — Telehealth: Payer: Self-pay | Admitting: Family Medicine

## 2019-08-10 MED ORDER — BASAGLAR KWIKPEN 100 UNIT/ML ~~LOC~~ SOPN: 25.0000 [IU] | PEN_INJECTOR | Freq: Every day | SUBCUTANEOUS | Status: DC

## 2019-08-10 NOTE — Telephone Encounter (Signed)
Patient now has supply of Basaglar - clarified this is the same insulin (insulin glargine) as Lantus.  We also discussed that she should continue to use the supply of Tresiba until gone at current dose of 25 units.    Plan that she will transition to the same dose of Basaglar at the time of transition from [place] in a few weeks.  I plan to follow-up with her by phone in 1 week to reassess blood glucose control.

## 2019-08-10 NOTE — Telephone Encounter (Signed)
Pt would like for Dr. Valentina Lucks to call her to discuss the Insulin she received in the mail today. She said they discussed this at her appointment but she did have some more questions for him.  The best call back number is [phone]

## 2019-08-10 NOTE — Telephone Encounter (Signed)
Thanks for your help in managing this patient Dr Valentina Lucks. I am pleased to see her glucose levels improving!

## 2019-08-16 ENCOUNTER — Telehealth: Payer: Self-pay | Admitting: Pharmacist

## 2019-08-16 DIAGNOSIS — E1165 Type 2 diabetes mellitus with hyperglycemia: Secondary | ICD-10-CM

## 2019-08-16 MED ORDER — TRESIBA FLEXTOUCH 100 UNIT/ML ~~LOC~~ SOPN: 22.0000 [IU] | PEN_INJECTOR | Freq: Every day | SUBCUTANEOUS | Status: DC

## 2019-08-16 MED ORDER — BASAGLAR KWIKPEN 100 UNIT/ML ~~LOC~~ SOPN: 22.0000 [IU] | PEN_INJECTOR | Freq: Every day | SUBCUTANEOUS | Status: DC

## 2019-08-16 MED ORDER — TRULICITY 0.75 MG/0.5ML ~~LOC~~ SOAJ: 0.7500 mg | SUBCUTANEOUS | Status: DC

## 2019-08-16 NOTE — Telephone Encounter (Signed)
Reviewed and agree.

## 2019-08-16 NOTE — Telephone Encounter (Signed)
Returned call.  Patient reported lowest reading of 140 and denies hypoglycemia symptoms.   Patient taking Tresiba 25 units daily.   Following instruction patient administered first dose of Trulicity while on the phone.   Verbalized understanding of once weekly dosing and side effects of nausea.   Reduced Tyler Aas (followed by Basaglar in the future when [place] supply is exhausted) to 22 units daily.   Follow- up by phone planned for 4 weeks.

## 2019-08-16 NOTE — Telephone Encounter (Signed)
Patient called - left message that she received Trulicity.  Requested call back to discuss tx plan.   Attempted to return call 12:31 - no answer.

## 2019-08-28 ENCOUNTER — Telehealth: Payer: Self-pay | Admitting: Family Medicine

## 2019-08-28 NOTE — Telephone Encounter (Signed)
Pt is calling to inform Dr. Valentina Lucks that her sugars have been good but she is running low on needles. Pt would like to speak with Dr. Valentina Lucks.   Please call pt at [phone].

## 2019-08-30 ENCOUNTER — Telehealth: Payer: Self-pay | Admitting: Pharmacist

## 2019-08-30 NOTE — Telephone Encounter (Signed)
Patient Assistance Program Note 1 (Date 08/30/19)  Patient: Sonya Walton DOB: 1960-12-21 Medication (Patient Assistance Program): Trulicity [facility] Cares); Basaglar [employer] Cares) Enrollment Date: 07/29/19 - 07/28/20 Shipping Address: [name] [address] Prescribing Physician: Madison Hickman

## 2019-09-13 ENCOUNTER — Telehealth: Payer: Self-pay | Admitting: Pharmacist

## 2019-09-13 NOTE — Telephone Encounter (Signed)
Called patient to discuss Trulicity adherence. Patient reported she has been taking her Trulicity 0.75mg and her basaglar 22units daily.  She reported her last reading was 98 and majority of readings in the low 100s.    Plan to F/U with patient by phone in ~2 weeks (Tuesday 11/24; Thursday 11/26 is Thanksgiving).

## 2019-09-17 NOTE — Telephone Encounter (Signed)
Hi Dr Valentina Lucks, thank you just checking is that a telephone F/U with myself or you?

## 2019-09-25 ENCOUNTER — Telehealth: Payer: Self-pay | Admitting: Pharmacist

## 2019-09-25 NOTE — Telephone Encounter (Signed)
Phone follow-up for Diabetes.   Patient reports blood sugar readings 118, 120 in the AM.    Reports taking Trulicity 0.75mg  once weekly and Basalar (insulin glargine)  22 units daily.  Denies hypoglycemia.  States she is feeling "great".   She expressed some worry related to Covid (friends hospitalized) and also having her mother in a nursing home.  She states she thinks she is handling things pretty well.   No DM medication changes today.  Appears to be doing very well.   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  I asked her to follow up with Dr. Posey Pronto for further discussion/evaluation.    She plans to follow-up with Dr. Posey Pronto in early December.

## 2019-09-25 NOTE — Telephone Encounter (Signed)
Noted and agree. 

## 2019-09-30 NOTE — Telephone Encounter (Signed)
Thanks Dr Valentina Lucks

## 2019-10-23 ENCOUNTER — Other Ambulatory Visit: Payer: Self-pay

## 2019-10-23 ENCOUNTER — Ambulatory Visit (INDEPENDENT_AMBULATORY_CARE_PROVIDER_SITE_OTHER): Payer: Self-pay | Admitting: Family Medicine

## 2019-10-23 ENCOUNTER — Ambulatory Visit (HOSPITAL_COMMUNITY)
Admission: RE | Admit: 2019-10-23 | Discharge: 2019-10-23 | Disposition: A | Discharge status: Home / Self Care | Payer: Self-pay | Source: Ambulatory Visit | Attending: Family Medicine | Admitting: Family Medicine

## 2019-10-23 ENCOUNTER — Encounter: Payer: Self-pay | Admitting: Family Medicine

## 2019-10-23 VITALS — BP 118/64 | HR 82 | Wt 204.0 lb

## 2019-10-23 DIAGNOSIS — R079 Chest pain, unspecified: Secondary | ICD-10-CM | POA: Insufficient documentation

## 2019-10-23 DIAGNOSIS — E1165 Type 2 diabetes mellitus with hyperglycemia: Secondary | ICD-10-CM

## 2019-10-23 LAB — POCT GLYCOSYLATED HEMOGLOBIN (HGB A1C): HbA1c, POC (controlled diabetic range): 7.3 % — AB (ref 0.0–7.0)

## 2019-10-23 MED ORDER — PEN NEEDLES 32G X 6 MM MISC: 1.0000 | Freq: Every day | 6 refills | Status: DC

## 2019-10-23 MED ORDER — AGAMATRIX PRESTO TEST VI STRP: ORAL_STRIP | 12 refills | Status: DC

## 2019-10-23 NOTE — Assessment & Plan Note (Signed)
Chest pain likely noncardiac i.e. musculoskeletal/stress in origin. Low suspicion for ACS given nature of chest pain and negative EKG.  Provided patient with safety net counseling for low threshold to go to the ED if chest pain occurs again.  Consider cardiac stress test if persistent chest pain.

## 2019-10-23 NOTE — Patient Instructions (Signed)
Sonya Walton,  It was lovely to see you yoday!! You're HbA1c was 7.3% today which is much improved from 15! You managing your diabetes very well. Keep an eye on your chest pain, if it gets worse go to the ER immediately.  Best wishes,  Dr Lattie Haw

## 2019-10-23 NOTE — Assessment & Plan Note (Signed)
HbA1c 7.3 today.  Diabetes well controlled. Continue Lantus and Trulicity.  HbA1c in 3 months.

## 2019-10-23 NOTE — Progress Notes (Signed)
5  Subjective:    Patient ID: Sonya Walton, female    DOB: Jan 22, 1961, 58 y.o.   MRN: [id number]  CC: Sonya Walton. Sonya Walton is a 58 year old female who presents today for diabetic check  HPI:  Diabetes HbA1c 15 in September. Takes Trulicity 8.92 mg weekly and Lantus 22 units daily-per Dr. Graylin Walton recommendations.  Monitor CBGs at home and are usually in the 100-120s (keeps diary but but did not bring this today). Denies side effects and has been managing well with these medications.  Has had a few episodes of hypoglycemia when patient has missed meal by a few hours but this happens very infrequently.  Patient also has changed her diet drastically from September and manages diet with portion control and reduction of sugary foods.  Chest pain and abdominal pain Reported the last few months have been quite stressful.  Her sister-in-law recently passed away from coronavirus and multiple family members have tested positive for coronavirus.  Over the last few weeks patient has had a few episodes of sharp left-sided chest pain, that last a few seconds, nonradiating and occur at rest.  Usually self resolving.  Also had intermittent RUQ pain which she reports down to bloating. Smoking status reviewed   ROS: pertinent noted in the HPI   Past medical history, surgical, family, and social history reviewed and updated in the EMR as appropriate. Reviewed problem list.   Objective:  BP 118/64   Pulse 82   Wt 204 lb (92.5 kg)   SpO2 97%   BMI 36.14 kg/m   Vitals and nursing note reviewed  General: NAD, pleasant, able to participate in exam Cardiac: RRR, S1 S2 present. normal heart sounds, no murmurs. Respiratory: CTAB, normal effort, No wheezes, rales or rhonchi GI: Abdomen nondistended, soft nontender bowel sounds present Extremities: no edema or cyanosis. Skin: warm and dry, no rashes noted Neuro: alert, no obvious focal deficits Psych: Normal affect and mood  EKG: Normal sinus rhythm no  ischemic changes Assessment & Plan:    Diabetes mellitus with hyperglycemia (HCC) HbA1c 7.3 today.  Diabetes well controlled. Continue Lantus and Trulicity.  HbA1c in 3 months.  Chest pain Chest pain likely noncardiac i.e. musculoskeletal/stress in origin. Low suspicion for ACS given nature of chest pain and negative EKG.  Provided patient with safety net counseling for low threshold to go to the ED if chest pain occurs again.  Consider cardiac stress test if persistent chest pain.    Sonya Haw, MD  [place] PGY-1

## 2019-10-29 ENCOUNTER — Other Ambulatory Visit: Payer: Self-pay | Admitting: Family Medicine

## 2019-11-02 DIAGNOSIS — E041 Nontoxic single thyroid nodule: Secondary | ICD-10-CM

## 2019-11-02 HISTORY — DX: Nontoxic single thyroid nodule: E04.1

## 2019-11-06 ENCOUNTER — Other Ambulatory Visit: Payer: Self-pay

## 2019-11-06 NOTE — Telephone Encounter (Signed)
Pharmacy faxed a request to get more information for Rx. Please specify directions,refills and quantity. Ottis Stain, CMA

## 2019-11-12 NOTE — Telephone Encounter (Signed)
Pt calling back again to check status of test strips.   To PCP  Talbot Grumbling, RN

## 2019-11-13 ENCOUNTER — Other Ambulatory Visit: Payer: Self-pay | Admitting: Family Medicine

## 2019-11-13 DIAGNOSIS — Z1211 Encounter for screening for malignant neoplasm of colon: Secondary | ICD-10-CM

## 2019-11-13 DIAGNOSIS — Z1231 Encounter for screening mammogram for malignant neoplasm of breast: Secondary | ICD-10-CM

## 2019-11-13 MED ORDER — CVS ADVANCED GLUCOSE TEST VI STRP: ORAL_STRIP | 0 refills | Status: DC

## 2019-11-13 NOTE — Telephone Encounter (Signed)
I have approved the test strips. Thanks

## 2020-02-22 ENCOUNTER — Ambulatory Visit (INDEPENDENT_AMBULATORY_CARE_PROVIDER_SITE_OTHER): Payer: Self-pay | Admitting: Family Medicine

## 2020-02-22 ENCOUNTER — Other Ambulatory Visit: Payer: Self-pay

## 2020-02-22 VITALS — BP 120/80 | HR 81 | Ht 63.07 in | Wt 215.6 lb

## 2020-02-22 DIAGNOSIS — B351 Tinea unguium: Secondary | ICD-10-CM

## 2020-02-22 DIAGNOSIS — I10 Essential (primary) hypertension: Secondary | ICD-10-CM

## 2020-02-22 DIAGNOSIS — Z1211 Encounter for screening for malignant neoplasm of colon: Secondary | ICD-10-CM

## 2020-02-22 DIAGNOSIS — Z1231 Encounter for screening mammogram for malignant neoplasm of breast: Secondary | ICD-10-CM

## 2020-02-22 DIAGNOSIS — L304 Erythema intertrigo: Secondary | ICD-10-CM

## 2020-02-22 MED ORDER — NYSTATIN 100000 UNIT/GM EX CREA: 1.0000 "application " | TOPICAL_CREAM | Freq: Two times a day (BID) | CUTANEOUS | 0 refills | Status: DC

## 2020-02-22 MED ORDER — ATENOLOL-CHLORTHALIDONE 50-25 MG PO TABS: ORAL_TABLET | ORAL | 0 refills | Status: DC

## 2020-02-22 MED ORDER — NYSTATIN 100000 UNIT/GM EX POWD: Freq: Four times a day (QID) | CUTANEOUS | 0 refills | Status: DC

## 2020-02-22 NOTE — Assessment & Plan Note (Signed)
Patient reports that now that she has the orange card, she would like another order for colonoscopy.  This order placed.  Given colonoscopy paper.

## 2020-02-22 NOTE — Assessment & Plan Note (Signed)
Same as above, now the patient has orange card, she would like another order for mammogram.  Order placed and mammogram paper given.

## 2020-02-22 NOTE — Assessment & Plan Note (Signed)
Exam consistent with onychomycosis.  Offered treatment to patient, she declines at this time.  Requesting referral to podiatry for nails to be cut and possibly talk about onychomycosis treatment further.  Referral placed.

## 2020-02-22 NOTE — Progress Notes (Signed)
    SUBJECTIVE:   CHIEF COMPLAINT / HPI:   Groin rash Has been red and itchy Worse when she was diagnosed with diabetes in September Has been using nystatin cream and powder that she is using twice daily, but only slight improvement in recent weeks, she is almost out and asking for a refill She did have improvement with this at first and it almost went away, feels like now "it is more of a heat rash and hurts with underwear" Did not take diflucan in September Denies vaginal discharge  Great Toenails Discolored and hard to cut Wants a referral to podiatry Occasionally has scaling and redness between toes  Has athlete's foot cream and wants to know if she can use this Wants her nails cut by a podiatrist  HTN Needs refill of medication Last labs in September Doing well, no concerns  Breast cancer screening Patient requesting order for mammogram  Colon cancer screening Patient requesting order for colonoscopy  PERTINENT  PMH / PSH: Type 2 diabetes, hypertension, morbid obesity  OBJECTIVE:   BP 120/80   Pulse 81   Ht 5' 3.07" (1.602 m)   Wt 215 lb 9.6 oz (97.8 kg)   SpO2 94%   BMI 38.11 kg/m    Physical Exam:  General: 59 y.o. female in NAD Lungs: Breathing comfortably on room air Skin: warm and dry, declined examination of groin Extremities: No edema, lateral great toes with darkening and thickening of toenails   ASSESSMENT/PLAN:   Intertrigo Declined examination today.  It sounds as though this may not be yeast related.  Can refill patient's nystatin cream and powder.  Also advised her to use cotton underwear and to keep the area dry by using baby powder during the day.  If no improvement, would recommend coming back for formal examination.  Onychomycosis Exam consistent with onychomycosis.  Offered treatment to patient, she declines at this time.  Requesting referral to podiatry for nails to be cut and possibly talk about onychomycosis treatment further.   Referral placed.  Hypertension Blood pressure well controlled today.  Refilled atenolol/chlorthalidone.  Advised patient to come back for lab work and follow-up with her PCP within 1 to 2 months.  Screening for malignant neoplasm of colon Patient reports that now that she has the orange card, she would like another order for colonoscopy.  This order placed.  Given colonoscopy paper.  Encounter for screening mammogram for malignant neoplasm of breast Same as above, now the patient has orange card, she would like another order for mammogram.  Order placed and mammogram paper given.   Recommend follow-up for diabetes, hypertension, consider lipid screening in 1 to 2 months with PCP.  Cleophas Dunker, [place]

## 2020-02-22 NOTE — Assessment & Plan Note (Signed)
Declined examination today.  It sounds as though this may not be yeast related.  Can refill patient's nystatin cream and powder.  Also advised her to use cotton underwear and to keep the area dry by using baby powder during the day.  If no improvement, would recommend coming back for formal examination.

## 2020-02-22 NOTE — Assessment & Plan Note (Signed)
Blood pressure well controlled today.  Refilled atenolol/chlorthalidone.  Advised patient to come back for lab work and follow-up with her PCP within 1 to 2 months.

## 2020-02-22 NOTE — Patient Instructions (Signed)
Thank you for coming to see me today. It was a pleasure. Today we talked about:   Try cotton underwear and baby powder during the day to keep your groin dry.   I have placed a referral to Podiatry for your toenails.  If you do not hear from them in the next 2 weeks, please give [place] a call.  Please follow-up with your PCP in 1-2 months for your blood pressure and diabetes.  If you have any questions or concerns, please do not hesitate to call the office at [phone].  Best,   [place] Constable, DO

## 2020-02-25 ENCOUNTER — Other Ambulatory Visit: Payer: Self-pay | Admitting: Family Medicine

## 2020-02-25 DIAGNOSIS — Z1231 Encounter for screening mammogram for malignant neoplasm of breast: Secondary | ICD-10-CM

## 2020-03-13 ENCOUNTER — Ambulatory Visit: Payer: Self-pay | Admitting: Family Medicine

## 2020-03-14 ENCOUNTER — Telehealth: Payer: Self-pay | Admitting: Pharmacist

## 2020-03-14 NOTE — Telephone Encounter (Signed)
Patient who is using Community education officer for "Trulicity" called to state that she called [employer] and they informed her that they are waiting on a document from her provider (at [facility]).   I looked in Dr. Serita Grit box but this form may actually be in one of the attending boxes or not available in the office.   I told patient I would try to determine status by early next week.  Patient reports she is in need of dose in 1 week (Thursday 5/20).   I would like to increase her Trulicity Dose from 8.54 to 1.5mg  with this process if possible.  Patient is willing to increase and aware of the change.

## 2020-03-17 ENCOUNTER — Telehealth: Payer: Self-pay

## 2020-03-17 NOTE — Telephone Encounter (Signed)
This dose change/refill request that we will be sending this week to [facility] for [facility] will be her last refill of medication-she will need to re-enroll by September 2021

## 2020-03-17 NOTE — Telephone Encounter (Signed)
Noted and appreciate teamwork.

## 2020-03-17 NOTE — Telephone Encounter (Signed)
I called [employer] and canceled her refill request for the Trulicity 0.75mg  and will have the form to you this afternoon for the 1.5mg  dose for the MD to sign.  Once signed, the form will be faxed to [facility] and I will have the meds shipped to her home address.

## 2020-03-18 ENCOUNTER — Encounter: Payer: Self-pay | Admitting: Pharmacist

## 2020-03-18 NOTE — Progress Notes (Signed)
Noted and agree. 

## 2020-03-19 ENCOUNTER — Telehealth: Payer: Self-pay | Admitting: Pharmacist

## 2020-03-19 NOTE — Telephone Encounter (Signed)
Patient called in AM - requesting call back.   I returned call and she reported she had contacted the pharmacy ([facility]) and they informed her that her Trulicity and Nancee Liter would be arriving in the next two days.    We discussed increasing her Trulicity dose from 0.75mg  to 1.5mg  weekly  with "next supply"  I faxed in the paperwork for Trulicity 1.5mg  weekly.  I anticipate she will be transitioning to next dose within the upcoming few weeks.  At that time, we may need to decrease her basaglar dose.   No further follow-up planned at this time.

## 2020-03-19 NOTE — Telephone Encounter (Signed)
Thank you for managing her diabetes Dr Valentina Lucks.

## 2020-03-19 NOTE — Progress Notes (Signed)
Noted thank you Dr Valentina Lucks

## 2020-04-03 ENCOUNTER — Ambulatory Visit: Payer: Self-pay | Admitting: Family Medicine

## 2020-04-07 ENCOUNTER — Encounter: Payer: Self-pay | Admitting: Family Medicine

## 2020-04-07 ENCOUNTER — Ambulatory Visit (INDEPENDENT_AMBULATORY_CARE_PROVIDER_SITE_OTHER): Payer: Self-pay | Admitting: Family Medicine

## 2020-04-07 ENCOUNTER — Other Ambulatory Visit: Payer: Self-pay

## 2020-04-07 VITALS — BP 115/80 | HR 84 | Ht 62.0 in | Wt 214.0 lb

## 2020-04-07 DIAGNOSIS — E1165 Type 2 diabetes mellitus with hyperglycemia: Secondary | ICD-10-CM

## 2020-04-07 DIAGNOSIS — I10 Essential (primary) hypertension: Secondary | ICD-10-CM

## 2020-04-07 DIAGNOSIS — L304 Erythema intertrigo: Secondary | ICD-10-CM

## 2020-04-07 LAB — POCT GLYCOSYLATED HEMOGLOBIN (HGB A1C): HbA1c, POC (controlled diabetic range): 7.2 % — AB (ref 0.0–7.0)

## 2020-04-07 MED ORDER — ATENOLOL-CHLORTHALIDONE 50-25 MG PO TABS: ORAL_TABLET | ORAL | 3 refills | Status: DC

## 2020-04-07 MED ORDER — FLUCONAZOLE 150 MG PO TABS
150.0000 mg | ORAL_TABLET | Freq: Once | ORAL | 0 refills | Status: AC
Start: 2020-04-07 — End: 2020-04-07

## 2020-04-07 NOTE — Assessment & Plan Note (Addendum)
A1c 7.2 today and at goal. Per Dr Graylin Shiver previous note will increase Trulicity to 1.75mg  weekly. Will reduce Glargine to 18 units from 22. Pt's goal is not to be on Trulicity as she is concerned about throat tumors as a side effect. A1c in 3 months. Goal is to wean patient off Glargine, consider starting Metformin. Referred patient to ophthalmology for diabetic eye exam.

## 2020-04-07 NOTE — Assessment & Plan Note (Signed)
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 Recommended continuing use of nystatin cream and powder and also prescribed fluconazole 150mg . F/u with me if no improvement. Recommended wearing looser clothes to allow skin to breathe.

## 2020-04-07 NOTE — Progress Notes (Signed)
    SUBJECTIVE:   CHIEF COMPLAINT / HPI:   Diabetes A1c today 7.2. Patient currently takes trulicity 0.5 mg once weekly and Glargine 22 units daily.  Overall managing well on these medications however does not wish to stay on Trulicity long-term due to risk of developing "throat tumors". Infrequently has hypoglycemic symptoms where her blood sugars are between 80-90.  Most of her fasting blood sugars are between 120 and 135 and patient keeps a daily diary of her CBGs.  She notices that when she has a snack at near bedtime her sugars are high the next day. Denies polyuria, polydipsia or blurred vision.  Does not yet have a ophthalmologist completed her diabetic eye exam.  Would like a refill insulin needles today.  Intertrigo Reports that when she was first diagnosed with diabetes in September 2020 she also had a skin yeast infection cleared up with topical antifungal creams and powders.  She has noticed yeast infection in her groin folds and has been applying the same creams and powders without much relief.   PERTINENT  PMH / PSH: Diabetes, hypertension  OBJECTIVE:   BP 115/80   Pulse 84   Ht 5\' 2"  (1.575 m)   Wt 214 lb (97.1 kg)   SpO2 95%   BMI 39.14 kg/m   General: Alert and cooperative and appears to be in no acute distress HEENT: Neck non-tender without lymphadenopathy, masses or thyromegaly Cardio: Normal S1 and S2, RRR. Pulm: CTAB, normal work of breathing Abdomen: Bowel sounds normal. Abdomen soft and non-tender.  Extremities: No peripheral edema. Warm/ well perfused.  Neuro: Cranial nerves grossly intact Skin: Significant erythema and hyperpigmentation noted in groin folds bilaterally and also in abdominal folds  ASSESSMENT/PLAN:   Diabetes mellitus with hyperglycemia (HCC) A1c 7.2 today and at goal. Per Dr Graylin Shiver previous note will increase Trulicity to 1.75mg  weekly. Will reduce Glargine to 18 units from 22. Pt's goal is not to be on Trulicity as she is concerned about  throat tumors as a side effect. A1c in 3 months. Goal is to wean patient off Glargine, consider starting Metformin. Referred patient to ophthalmology for diabetic eye exam.  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 Recommended continuing use of nystatin cream and powder and also prescribed fluconazole 150mg . F/u with me if no improvement. Recommended wearing looser clothes to allow skin to breathe.     Sonya Haw, MD [place]

## 2020-04-07 NOTE — Patient Instructions (Addendum)
It was lovely to see you today!!  Your A1c is stable at 7.2.  We are increasing her Trulicity to 1.5 mg weekly.  We can start this injection tomorrow.  We can cut back on the glargine from 22 units to 18 units given that your A1c is stable. Keep up the great work in your diabetes.  I have also placed a referral for ophthalmology so you can get a diabetic eye exam which you should get every year.  For your skin rash it is called intertrigo which is a yeast infection in the skin folds.  Please continue fungal cream and powders.  I am prescribing you another oral tablet which is an antifungal medication.   Please consider getting the Covid vaccine.  You can go to the health department or go to [email or web address] to schedule a Covid vaccine ointment.   Please follow-up with me in 3 months, and to let me know if you have any side effects when increasing the Trulicity.  Best wishes,  Dr. Posey Pronto    Skin Yeast Infection  A skin yeast infection is a condition in which there is an overgrowth of yeast (candida) that normally lives on the skin. This condition usually occurs in areas of the skin that are constantly warm and moist, such as the armpits or the groin. What are the causes? This condition is caused by a change in the normal balance of the yeast and bacteria that live on the skin. What increases the risk? You are more likely to develop this condition if you:  Are obese.  Are pregnant.  Take birth control pills.  Have diabetes.  Take antibiotic medicines.  Take steroid medicines.  Are malnourished.  Have a weak body defense system (immune system).  Are 58 years of age or older.  Wear tight clothing. What are the signs or symptoms? The most common symptom of this condition is itchiness in the affected area. Other symptoms include:  Red, swollen area of the skin.  Bumps on the skin. How is this diagnosed?  This condition is diagnosed with a medical history and physical  exam.  Your health care provider may check for yeast by taking light scrapings of the skin to be viewed under a microscope. How is this treated? This condition is treated with medicine. Medicines may be prescribed or be available over the counter. The medicines may be:  Taken by mouth (orally).  Applied as a cream or powder to your skin. Follow these instructions at home:   Take or apply over-the-counter and prescription medicines only as told by your health care provider.  Maintain a healthy weight. If you need help losing weight, talk with your health care provider.  Keep your skin clean and dry.  If you have diabetes, keep your blood sugar under control.  Keep all follow-up visits as told by your health care provider. This is important. Contact a health care provider if:  Your symptoms go away and then return.  Your symptoms do not get better with treatment.  Your symptoms get worse.  Your rash spreads.  You have a fever or chills.  You have new symptoms.  You have new warmth or redness of your skin. Summary  A skin yeast infection is a condition in which there is an overgrowth of yeast (candida) that normally lives on the skin. This condition is caused by a change in the normal balance of the yeast and bacteria that live on the skin.  Take or apply over-the-counter  and prescription medicines only as told by your health care provider.  Keep your skin clean and dry.  Contact a health care provider if your symptoms do not get better with treatment. This information is not intended to replace advice given to you by your health care provider. Make sure you discuss any questions you have with your health care provider. Document Revised: 03/07/2018 Document Reviewed: 03/07/2018 Elsevier Patient Education  [place].

## 2020-04-08 ENCOUNTER — Encounter: Payer: Self-pay | Admitting: Gastroenterology

## 2020-05-09 ENCOUNTER — Other Ambulatory Visit: Payer: Self-pay

## 2020-05-09 ENCOUNTER — Ambulatory Visit: Payer: Self-pay

## 2020-05-09 VITALS — Ht 62.0 in | Wt 217.0 lb

## 2020-05-09 DIAGNOSIS — Z1211 Encounter for screening for malignant neoplasm of colon: Secondary | ICD-10-CM

## 2020-05-09 NOTE — Progress Notes (Signed)
Denies allergies to eggs or soy products. Denies complication of anesthesia or sedation. Denies use of weight loss medication. Denies use of O2.   Emmi instructions given for colonoscopy.  Covid vaccine was completed on 04/02/20.

## 2020-05-13 ENCOUNTER — Other Ambulatory Visit: Payer: Self-pay | Admitting: Family Medicine

## 2020-05-13 ENCOUNTER — Telehealth: Payer: Self-pay

## 2020-05-13 DIAGNOSIS — Z1231 Encounter for screening mammogram for malignant neoplasm of breast: Secondary | ICD-10-CM

## 2020-05-13 NOTE — Telephone Encounter (Signed)
Thanks Paige, I have placed the new order for the left breast.

## 2020-05-13 NOTE — Telephone Encounter (Signed)
Patient was contacted by the breast center to schedule her annual mammogram. Patient reported to them she was having nipple pain and general pain in her left breast. With this information they will be unable to perform normal mammogram without an order for diagnostic left breast. This will allow for further evaluation in left breast. Please place order for diagnostic in left breast only.

## 2020-05-14 ENCOUNTER — Other Ambulatory Visit: Payer: Self-pay | Admitting: *Deleted

## 2020-05-14 DIAGNOSIS — N644 Mastodynia: Secondary | ICD-10-CM

## 2020-05-21 ENCOUNTER — Encounter: Payer: Self-pay | Admitting: Gastroenterology

## 2020-05-21 ENCOUNTER — Ambulatory Visit (AMBULATORY_SURGERY_CENTER): Payer: Self-pay | Admitting: Gastroenterology

## 2020-05-21 VITALS — BP 105/58 | HR 74 | Temp 97.5°F | Resp 22 | Ht 62.0 in | Wt 217.0 lb

## 2020-05-21 DIAGNOSIS — D123 Benign neoplasm of transverse colon: Secondary | ICD-10-CM

## 2020-05-21 DIAGNOSIS — D125 Benign neoplasm of sigmoid colon: Secondary | ICD-10-CM

## 2020-05-21 DIAGNOSIS — D127 Benign neoplasm of rectosigmoid junction: Secondary | ICD-10-CM

## 2020-05-21 DIAGNOSIS — K635 Polyp of colon: Secondary | ICD-10-CM

## 2020-05-21 DIAGNOSIS — D120 Benign neoplasm of cecum: Secondary | ICD-10-CM | POA: Diagnosis not present

## 2020-05-21 DIAGNOSIS — K621 Rectal polyp: Secondary | ICD-10-CM

## 2020-05-21 DIAGNOSIS — Z86010 Personal history of colonic polyps: Secondary | ICD-10-CM

## 2020-05-21 DIAGNOSIS — Z1211 Encounter for screening for malignant neoplasm of colon: Secondary | ICD-10-CM

## 2020-05-21 DIAGNOSIS — K5730 Diverticulosis of large intestine without perforation or abscess without bleeding: Secondary | ICD-10-CM

## 2020-05-21 DIAGNOSIS — D128 Benign neoplasm of rectum: Secondary | ICD-10-CM

## 2020-05-21 MED ORDER — SODIUM CHLORIDE 0.9 % IV SOLN: 500.0000 mL | Freq: Once | INTRAVENOUS | Status: DC

## 2020-05-21 NOTE — Progress Notes (Signed)
pt tolerated well. VSS. awake and to recovery. Report given to RN.  

## 2020-05-21 NOTE — Progress Notes (Signed)
Pt's states no medical or surgical changes since previsit or office visit. 

## 2020-05-21 NOTE — Progress Notes (Signed)
VS taken by C.W. 

## 2020-05-21 NOTE — Op Note (Signed)
[facility] Patient Name: Sonya Walton Procedure Date: 05/21/2020 8:28 AM MRN: [id number] Endoscopist: Gerrit Heck , MD Age: 59 Referring MD:  Date of Birth: 05-13-1961 Gender: Female Account #: [id number] Procedure:                Colonoscopy Indications:              Surveillance: Personal history of colonic polyps                            (unknown histology) on last colonoscopy more than 5                            years ago                           -Colonoscopy in 2008 at outside facility with a                            single polyp per patient. Medicines:                Monitored Anesthesia Care Procedure:                Pre-Anesthesia Assessment:                           - Prior to the procedure, a History and Physical                            was performed, and patient medications and                            allergies were reviewed. The patient's tolerance of                            previous anesthesia was also reviewed. The risks                            and benefits of the procedure and the sedation                            options and risks were discussed with the patient.                            All questions were answered, and informed consent                            was obtained. Prior Anticoagulants: The patient has                            taken no previous anticoagulant or antiplatelet                            agents. ASA Grade Assessment: II - A patient with  mild systemic disease. After reviewing the risks                            and benefits, the patient was deemed in                            satisfactory condition to undergo the procedure.                           After obtaining informed consent, the colonoscope                            was passed under direct vision. Throughout the                            procedure, the patient's blood pressure, pulse, and                             oxygen saturations were monitored continuously. The                            Colonoscope was introduced through the anus and                            advanced to the the terminal ileum. The colonoscopy                            was technically difficult and complex due to a                            tortuous colon. Successful completion of the                            procedure was aided by using manual pressure and                            withdrawing the scope and replacing with the                            pediatric colonoscope. The patient tolerated the                            procedure well. The quality of the bowel                            preparation was good. The terminal ileum, ileocecal                            valve, appendiceal orifice, and rectum were                            photographed. Scope In: 8:34:45 AM Scope Out: 9:26:05 AM Scope Withdrawal Time: 0 hours 36 minutes 12 seconds  Total Procedure Duration: 0 hours 51  minutes 20 seconds  Findings:                 The perianal and digital rectal examinations were                            normal.                           The sigmoid colon was significantly tortuous.                            Advancing the scope required using manual pressure                            and withdrawing the scope and replacing with the                            pediatric colonoscope.                           Ten sessile polyps were found in the sigmoid colon                            (4), transverse colon (3), and cecum (3). The                            polyps were 2 to 6 mm in size. These polyps were                            removed with a cold snare and cold forceps for the                            smaller 2 polyps. Resection and retrieval were                            complete. Estimated blood loss was minimal.                           Many sessile polyps were found in the rectum,                             recto-sigmoid colon and distal sigmoid colon. The                            polyps were 1 to 3 mm in size. >10 of these polyps                            were removed with a cold biopsy forceps for                            histologic representative evaluation. Resection and  retrieval were complete. Estimated blood loss was                            minimal.                           Multiple small and large-mouthed diverticula were                            found in the sigmoid colon.                           An area of mildly nodular mucosa was found at the                            ileocecal valve. This did not extend into the                            terminal ileum. Biopsies were taken with a cold                            forceps for histology. Estimated blood loss was                            minimal.                           A single medium-sized angioectasia without bleeding                            was found in the proximal ascending colon.                           The terminal ileum appeared normal.                           The retroflexed view of the distal rectum and anal                            verge was normal and showed no anal or rectal                            abnormalities. Complications:            No immediate complications. Estimated Blood Loss:     Estimated blood loss was minimal. Impression:               - Tortuous colon.                           - Ten 2 to 6 mm polyps in the sigmoid colon, in the                            transverse colon and in the cecum, removed with a  cold snare. Resected and retrieved.                           - Many 1 to 3 mm polyps in the rectum, at the                            recto-sigmoid colon and in the distal sigmoid                            colon, removed with a cold biopsy forceps. Resected                            and retrieved.                            - Diverticulosis in the sigmoid colon.                           - Nodular mucosa at the ileocecal valve. Biopsied.                           - A single non-bleeding colonic angioectasia.                           - The examined portion of the ileum was normal.                           - The distal rectum and anal verge are normal on                            retroflexion view. Recommendation:           - Patient has a contact number available for                            emergencies. The signs and symptoms of potential                            delayed complications were discussed with the                            patient. Return to normal activities tomorrow.                            Written discharge instructions were provided to the                            patient.                           - Resume previous diet.                           - Continue present medications.                           -  Await pathology results.                           - Repeat colonoscopy for surveillance based on                            pathology results.                           - Return to GI office PRN. Gerrit Heck, MD 05/21/2020 9:37:02 AM

## 2020-05-21 NOTE — Progress Notes (Signed)
Called to room to assist during endoscopic procedure.  Patient ID and intended procedure confirmed with present staff. Received instructions for my participation in the procedure from the performing physician.  

## 2020-05-21 NOTE — Patient Instructions (Signed)
YOU HAD AN ENDOSCOPIC PROCEDURE TODAY AT [facility]:   Refer to the procedure report that was given to you for any specific questions about what was found during the examination.  If the procedure report does not answer your questions, please call your gastroenterologist to clarify.  If you requested that your care partner not be given the details of your procedure findings, then the procedure report has been included in a sealed envelope for you to review at your convenience later.  YOU SHOULD EXPECT: Some feelings of bloating in the abdomen. Passage of more gas than usual.  Walking can help get rid of the air that was put into your GI tract during the procedure and reduce the bloating. If you had a lower endoscopy (such as a colonoscopy or flexible sigmoidoscopy) you may notice spotting of blood in your stool or on the toilet paper. If you underwent a bowel prep for your procedure, you may not have a normal bowel movement for a few days.  Please Note:  You might notice some irritation and congestion in your nose or some drainage.  This is from the oxygen used during your procedure.  There is no need for concern and it should clear up in a day or so.  SYMPTOMS TO REPORT IMMEDIATELY:   Following lower endoscopy (colonoscopy or flexible sigmoidoscopy):  Excessive amounts of blood in the stool  Significant tenderness or worsening of abdominal pains  Swelling of the abdomen that is new, acute  Fever of 100F or higher  For urgent or emergent issues, a gastroenterologist can be reached at any hour by calling [phone]. Do not use MyChart messaging for urgent concerns.    DIET:  We do recommend a small meal at first, but then you may proceed to your regular diet.  Drink plenty of fluids but you should avoid alcoholic beverages for 24 hours.  MEDICATIONS: Continue present medications.  FOLLOW UP with Dr. Bryan Lemma in his office as needed.  Please see handouts given to you  by your recovery nurse.  ACTIVITY:  You should plan to take it easy for the rest of today and you should NOT DRIVE or use heavy machinery until tomorrow (because of the sedation medicines used during the test).    FOLLOW UP: Our staff will call the number listed on your records 48-72 hours following your procedure to check on you and address any questions or concerns that you may have regarding the information given to you following your procedure. If we do not reach you, we will leave a message.  We will attempt to reach you two times.  During this call, we will ask if you have developed any symptoms of COVID 19. If you develop any symptoms (ie: fever, flu-like symptoms, shortness of breath, cough etc.) before then, please call [phone].  If you test positive for Covid 19 in the 2 weeks post procedure, please call and report this information to [place].    If any biopsies were taken you will be contacted by phone or by letter within the next 1-3 weeks.  Please call us at [phone] if you have not heard about the biopsies in 3 weeks.   Thank you for allowing [place] to provide for your healthcare needs today.   SIGNATURES/CONFIDENTIALITY: You and/or your care partner have signed paperwork which will be entered into your electronic medical record.  These signatures attest to the fact that that the information above on your After Visit Summary has  been reviewed and is understood.  Full responsibility of the confidentiality of this discharge information lies with you and/or your care-partner.

## 2020-05-22 ENCOUNTER — Other Ambulatory Visit: Payer: Self-pay

## 2020-05-22 ENCOUNTER — Encounter: Payer: Self-pay | Admitting: Podiatry

## 2020-05-22 ENCOUNTER — Ambulatory Visit (INDEPENDENT_AMBULATORY_CARE_PROVIDER_SITE_OTHER): Payer: Self-pay | Admitting: Podiatry

## 2020-05-22 ENCOUNTER — Encounter: Payer: Self-pay | Admitting: Urology

## 2020-05-22 DIAGNOSIS — B351 Tinea unguium: Secondary | ICD-10-CM

## 2020-05-22 DIAGNOSIS — E1165 Type 2 diabetes mellitus with hyperglycemia: Secondary | ICD-10-CM

## 2020-05-22 DIAGNOSIS — L84 Corns and callosities: Secondary | ICD-10-CM

## 2020-05-22 DIAGNOSIS — M79674 Pain in right toe(s): Secondary | ICD-10-CM

## 2020-05-22 DIAGNOSIS — M79675 Pain in left toe(s): Secondary | ICD-10-CM

## 2020-05-23 ENCOUNTER — Telehealth: Payer: Self-pay

## 2020-05-23 NOTE — Telephone Encounter (Signed)
Covid-19 screening questions   Do you now or have you had a fever in the last 14 days? No.  Do you have any respiratory symptoms of shortness of breath or cough now or in the last 14 days? No.  Do you have any family members or close contacts with diagnosed or suspected Covid-19 in the past 14 days? No.  Have you been tested for Covid-19 and found to be positive? No.       Follow up Call-  Call back number 05/21/2020  Post procedure Call Back phone  # [phone]  Permission to leave phone message Yes  Some recent data might be hidden     Patient questions:  Do you have a fever, pain , or abdominal swelling? No. Pain Score  0 *  Have you tolerated food without any problems? Yes.    Have you been able to return to your normal activities? Yes.    Do you have any questions about your discharge instructions: Diet   No. Medications  No. Follow up visit  No.  Do you have questions or concerns about your Care? No.  Actions: * If pain score is 4 or above: No action needed, pain <4.

## 2020-05-25 NOTE — Progress Notes (Signed)
Subjective: Sonya Walton presents today referred by Towanda Octave, MD for diabetic foot evaluation.  Patient denies any history of foot wounds.  Patient does relate symptoms of numbness and tingling b/l.  Today, patient c/o of painful, discolored, thick toenails which interfere with daily activities.  Pain is aggravated when wearing enclosed shoe gear.   Past Medical History:  Diagnosis Date  . Allergy   . Arthritis   . Diabetes mellitus without complication (HCC)   . Hoarse voice quality 01/19/2016  . Hypertension    states under control with med., has been on med. since 2011  . Immature cataract   . 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 Partial tear of right rotator cuff 12/2015  . Wears dentures    upper    Patient Active Problem List   Diagnosis Date Noted  . Morbid obesity (HCC) 02/22/2020  . Onychomycosis 02/22/2020  . Intertrigo 02/22/2020  . Screening for malignant neoplasm of colon 02/22/2020  . Encounter for screening mammogram for malignant neoplasm of breast 02/22/2020  . Uninsured 07/23/2019  . Vulvovaginal candidiasis 07/20/2019  . Left breast mass 07/07/2018  . Plantar fasciitis of right foot 11/24/2017  . Skin pustule 11/24/2016  . Pain in joint, shoulder region 04/12/2016  . Diabetes mellitus with hyperglycemia (HCC) 04/12/2016  . Rotator cuff arthropathy 12/30/2015  . Partial tear of right rotator cuff 05/02/2015  . Low back pain 04/06/2015  . Chest pain 12/28/2013  . Hypertension 10/05/2012  . History of tobacco use 12/31/2011  . Arthritis 12/31/2011    Past Surgical History:  Procedure Laterality Date  . BREAST BIOPSY Left   . BUNIONECTOMY Bilateral 1994  . CHOLECYSTECTOMY  01/17/2012   Procedure: LAPAROSCOPIC CHOLECYSTECTOMY;  Surgeon: Shelly Rubenstein, MD;  Location: [facility] OR;  Service: General;  Laterality: N/A;  . PUBOVAGINAL SLING  03/05/2003  . SHOULDER ARTHROSCOPY WITH DISTAL CLAVICLE RESECTION Right 01/26/2016   Procedure: SHOULDER  ARTHROSCOPY WITH DISTAL CLAVICLE RESECTION;  Surgeon: Jones Broom, MD;  Location: [facility];  Service: Orthopedics;  Laterality: Right;  . SHOULDER ARTHROSCOPY WITH SUBACROMIAL DECOMPRESSION Right 01/26/2016   Procedure: SHOULDER ARTHROSCOPY WITH SUBACROMIAL DECOMPRESSION DEBRIDEMENT;  Surgeon: Jones Broom, MD;  Location: [facility];  Service: Orthopedics;  Laterality: Right;  . TONSILLECTOMY  1975  . TOTAL VAGINAL HYSTERECTOMY  03/05/2003  . TUBAL LIGATION  1981    Current Outpatient Medications on File Prior to Visit  Medication Sig Dispense Refill  . acetaminophen (TYLENOL) 500 MG tablet Take 1,000 mg by mouth every 6 (six) hours as needed for mild pain or moderate pain (Takes PRN about twice weekly).    Marland Kitchen atenolol-chlorthalidone (TENORETIC) 50-25 MG tablet TAKE 1 TABLET BY MOUTH EVERY DAY 90 tablet 3  . blood glucose meter kit and supplies KIT Inject 1 each into the skin as directed. Use meter once daily to measure blood glucose. 1 each 0  . cetirizine (ZYRTEC) 10 MG chewable tablet Chew 10 mg by mouth daily.    . Cholecalciferol (VITAMIN D3) 2000 UNITS TABS Take 2,000 Units by mouth every morning.    . Dulaglutide (TRULICITY) 0.75 MG/0.5ML SOPN Inject 0.75 mg into the skin once a week.    Marland Kitchen glucose blood ([facility] ADVANCED GLUCOSE TEST) test strip Please specify directions, refills and quantity\ Once daily, MKS:[id number], 3 refills, 100 quantity Dr Towanda Octave MD 100 each 0  . Insulin Glargine (BASAGLAR KWIKPEN) 100 UNIT/ML SOPN Inject 0.22 mLs (22 Units total) into the skin  daily.    . Insulin Pen Needle (PEN NEEDLES) 32G X 6 MM MISC 1 each by Does not apply route daily. 50 each 6  . nystatin (NYSTATIN) powder Apply topically 4 (four) times daily. 15 g 0  . nystatin cream (MYCOSTATIN) Apply 1 application topically 2 (two) times daily. 30 g 0  . [DISCONTINUED] glucose blood (AGAMATRIX PRESTO TEST) test strip Use as instructed 100 each 12  . [DISCONTINUED]  pantoprazole (PROTONIX) 40 MG tablet Take 1 tablet (40 mg total) by mouth daily at 12 noon. 30 tablet 0   Current Facility-Administered Medications on File Prior to Visit  Medication Dose Route Frequency Provider Last Rate Last Admin  . 0.9 %  sodium chloride infusion  500 mL Intravenous Once Cirigliano, Vito V, DO         Allergies  Allergen Reactions  . Percocet [Oxycodone-Acetaminophen] Other (See Comments)    Dysphoria    Social History   Occupational History  . Not on file  Tobacco Use  . Smoking status: Former Smoker    Packs/day: 1.00    Years: 36.00    Pack years: 36.00    Types: Cigarettes    Start date: 11/01/1981    Quit date: 01/09/2018    Years since quitting: 2.3  . Smokeless tobacco: Never Used  . Tobacco comment: 1 ppd max use.   Vaping Use  . Vaping Use: Never used  Substance and Sexual Activity  . Alcohol use: Yes    Alcohol/week: 0.0 standard drinks    Comment: rarely  . Drug use: No  . Sexual activity: Yes    Partners: Male    Family History  Problem Relation Age of Onset  . Liver cancer Father   . Cancer Father        lung and liver  . Heart disease Mother   . Hyperlipidemia Mother   . Hypertension Mother   . Heart failure Mother   . Hypertension Other   . Diabetes Other   . Asthma Other   . Diabetes type II Sister   . Breast cancer Sister   . Diabetes type II Brother   . Cancer Paternal Aunt        lung  . Asthma Sister   . Diabetes Sister   . Hypertension Sister   . Early death Sister        premature twins   . Hypertension Brother   . Diabetes Brother   . Asthma Brother   . Cancer Other        lung  . Breast cancer Sister 77  . COPD Sister   . Colon polyps Neg Hx   . Esophageal cancer Neg Hx   . Rectal cancer Neg Hx   . Stomach cancer Neg Hx     Immunization History  Administered Date(s) Administered  . Influenza-Unspecified 08/06/2014, 08/03/2016, 08/06/2017  . Tdap 02/23/2013    Review of systems: Positive Findings  in bold print.  Constitutional:  chills, fatigue, fever, sweats, weight change Communication: Optometrist, sign Ecologist, hand writing, iPad/Android device Head: headaches, head injury Eyes: changes in vision, eye pain, glaucoma, cataracts, macular degeneration, diplopia, glare,  light sensitivity, eyeglasses or contacts, blindness Ears nose mouth throat: hearing impaired, hearing aids,  ringing in ears, deaf, sign language,  vertigo, nosebleeds,  rhinitis,  cold sores, snoring, swollen glands Cardiovascular: HTN, edema, arrhythmia, pacemaker in place, defibrillator in place, chest pain/tightness, chronic anticoagulation, blood clot, heart failure, MI Peripheral Vascular: leg cramps, varicose veins,  blood clots, lymphedema, varicosities Respiratory:  difficulty breathing, denies congestion, SOB, wheezing, cough, emphysema Gastrointestinal: change in appetite or weight, abdominal pain, constipation, diarrhea, nausea, vomiting, vomiting blood, change in bowel habits, abdominal pain, jaundice, rectal bleeding, hemorrhoids, GERD Genitourinary:  nocturia,  pain on urination, polyuria,  blood in urine, Foley catheter, urinary urgency, ESRD on hemodialysis Musculoskeletal: amputation, cramping, stiff joints, painful joints, decreased joint motion, fractures, OA, gout, hemiplegia, paraplegia, uses cane, wheelchair bound, uses walker, uses rollator Skin: +changes in toenails, color change, dryness, itching, mole changes,  rash, wound(s) Neurological: headaches, numbness in feet, paresthesias in feet, burning in feet, fainting,  seizures, change in speech,  headaches, memory problems/poor historian, cerebral palsy, weakness, paralysis, CVA, TIA Endocrine: diabetes, hypothyroidism, hyperthyroidism,  goiter, dry mouth, flushing, heat intolerance,  cold intolerance,  excessive thirst, denies polyuria,  nocturia Hematological:  easy bleeding, excessive bleeding, easy bruising, enlarged lymph nodes, on  long term blood thinner, history of past transusions Allergy/immunological:  hives, eczema, frequent infections, multiple drug allergies, seasonal allergies, transplant recipient, multiple food allergies Psychiatric:  anxiety, depression, mood disorder, suicidal ideations, hallucinations, insomnia  Objective: There were no vitals filed for this visit.  Sonya Walton is a pleasant 59 y.o. Caucasian female in NAD.Marland Kitchen AAO X 3.  Vascular Examination: Capillary fill time to digits <3 seconds b/l lower extremities. Palpable pedal pulses b/l LE. Pedal hair sparse. Lower extremity skin temperature gradient within normal limits. No pain with calf compression b/l.  Dermatological Examination: Pedal skin with normal turgor, texture and tone bilaterally. No open wounds bilaterally. No interdigital macerations bilaterally. Toenails 1-5 b/l elongated, discolored, dystrophic, thickened, crumbly with subungual debris and tenderness to dorsal palpation. Hyperkeratotic lesion(s) left foot.  No erythema, no edema, no drainage, no flocculence.  Musculoskeletal Examination: Normal muscle strength 5/5 to all lower extremity muscle groups bilaterally. No pain crepitus or joint limitation noted with ROM b/l. Hallux valgus with bunion deformity noted b/l lower extremities.  Footwear Assessment: Does the patient wear appropriate shoes? Yes. Does the patient need inserts/orthotics? No.  Neurological Examination: Protective sensation intact 5/5 intact bilaterally with 10g monofilament b/l. Vibratory sensation decreased b/l. Proprioception intact bilaterally. Deep tendon reflexes normal b/l.  Clonus negative b/l.  Hemoglobin A1C Latest Ref Rng & Units 04/07/2020 10/23/2019 07/20/2019  HGBA1C 0.0 - 7.0 % 7.2(A) 7.3(A) >15.0  Some recent data might be hidden   Assessment: 1. Pain due to onychomycosis of toenails of both feet   2. Callus   3. Type 2 diabetes mellitus with hyperglycemia, without long-term current use  of insulin (HCC)      ADA Risk Categorization: Low Risk:  Patient has all of the following: Intact protective sensation No prior foot ulcer  No severe deformity Pedal pulses present  Plan: -Examined patient. -Diabetic foot examination performed on today's visit. -Continue diabetic foot care principles. -Toenails 1-5 b/l were debrided in length and girth with sterile nail nippers and dremel without iatrogenic bleeding.  -Callus(es) left foot pared utilizing sterile scalpel blade without complication or incident. Total number debrided =1. -Patient to report any pedal injuries to medical professional immediately. -Patient to continue soft, supportive shoe gear daily. -Patient/POA to call should there be question/concern in the interim.  Return in about 3 months (around 08/22/2020).

## 2020-05-26 ENCOUNTER — Telehealth: Payer: Self-pay | Admitting: *Deleted

## 2020-05-26 NOTE — Telephone Encounter (Signed)
Patient left me a voicemail with a question about the correct address for her BCCCP appointment on 05/27/2020. Attempted to call patient back. No one answered phone and patient doesn't have voicemail to leave a message.

## 2020-05-27 ENCOUNTER — Ambulatory Visit
Admission: RE | Admit: 2020-05-27 | Discharge: 2020-05-27 | Disposition: A | Discharge status: Home / Self Care | Payer: No Typology Code available for payment source | Source: Ambulatory Visit | Attending: Obstetrics and Gynecology | Admitting: Obstetrics and Gynecology

## 2020-05-27 ENCOUNTER — Other Ambulatory Visit: Payer: Self-pay

## 2020-05-27 ENCOUNTER — Ambulatory Visit: Payer: No Typology Code available for payment source | Admitting: *Deleted

## 2020-05-27 VITALS — BP 144/84 | Temp 97.8°F | Wt 216.9 lb

## 2020-05-27 DIAGNOSIS — N644 Mastodynia: Secondary | ICD-10-CM

## 2020-05-27 DIAGNOSIS — Z1239 Encounter for other screening for malignant neoplasm of breast: Secondary | ICD-10-CM

## 2020-05-27 NOTE — Patient Instructions (Addendum)
Informed Sonya Walton about breast self awareness. Patient did not need a Pap smear today due to last Pap smear was on 07/20/2019 per patient. Patient also had a hysterectomy for benign reasons and needs no further Pap smears. Referred patient to the [facility] for diagnostic mammogram. Appointment scheduled for May 27, 2020 at 10:30am. Patient aware of appointment and will be there. [name] verbalized understanding.  Vania Rea, RN, FNP student 9:40 AM

## 2020-05-27 NOTE — Progress Notes (Signed)
[name] is a 59 y.o. female who presents to [facility] clinic today with complaints of intermittent left breast pain.    Pap Smear: Pap smear not completed today. Last Pap smear was 07/20/2019 at [facility] clinic and was normal with negative HPV. Per patient has no history of an abnormal Pap smear. Last Pap smear result is available in Epic. Patient had a hysterectomy in 2004 or 2005 for benign reasons (fibroids) and therefore needs no further Pap smears.   Physical exam: Breasts Breasts symmetrical. No skin abnormalities bilateral breasts. No nipple retraction bilateral breasts. No nipple discharge bilateral breasts. No lymphadenopathy. No lumps palpated bilateral breasts. Patient complained of left axillary area pain and pain above the left areola during exam. She states that the pain has been going on for a month and describes the pain as a 4/10 in severity. She states that the pain comes and goes. Patient mentions that she had a biopsy in the left breast in 2004 and that the results were benign.     Pelvic/Bimanual Pap is not indicated today.    Smoking History: Patient is a former smoker that quit in 2019.    Patient Navigation: Patient education provided. Access to services provided for patient through [facility] program.    Colorectal Cancer Screening: Per patient has had a colonoscopy on 05/21/2020. She states she is not aware of the results yet. No complaints today.    Breast and Cervical Cancer Risk Assessment: Patient has family history of breast cancer in her sister. No known genetic mutations, or radiation treatment to the chest before age 60. Patient does not have history of cervical dysplasia, immunocompromised, or DES exposure in-utero.  Risk Assessment    Risk Scores      05/27/2020   Last edited by: Sonya Walton, CMA   5-year risk: 2.9 %   Lifetime risk: 14.2 %          A: BCCCP exam without pap smear Complaints of intermittent left breast  pain.  P: Referred patient to the [facility] for a diagnostic mammogram. Appointment scheduled May 27, 2020 at 10:30am.  Sonya Rea, RN, FNP student 05/27/2020 9:35 AM   Attestation of Supervision of Student:  I confirm that I have verified the information documented in the nurse practitioner student's note and that I have also personally reperformed the history, physical exam and all medical decision making activities.  I have verified that all services and findings are accurately documented in this student's note; and I agree with management and plan as outlined in the documentation. I have also made any necessary editorial changes.  Sonya Walton, [place] for [employer], [employer] 05/27/2020 9:46 AM

## 2020-05-30 ENCOUNTER — Other Ambulatory Visit: Payer: Self-pay | Admitting: Family Medicine

## 2020-05-30 ENCOUNTER — Telehealth: Payer: Self-pay | Admitting: Family Medicine

## 2020-05-30 DIAGNOSIS — N6453 Retraction of nipple: Secondary | ICD-10-CM

## 2020-05-30 NOTE — Telephone Encounter (Signed)
Contacted patient regarding abnormal breast US results showing nipple retraction. Recommended MRI breast. Patient will follow up with following mri breast results.

## 2020-06-05 ENCOUNTER — Encounter: Payer: Self-pay | Admitting: Gastroenterology

## 2020-06-06 ENCOUNTER — Telehealth: Payer: Self-pay

## 2020-06-06 ENCOUNTER — Other Ambulatory Visit: Payer: Self-pay | Admitting: Obstetrics and Gynecology

## 2020-06-06 DIAGNOSIS — N6453 Retraction of nipple: Secondary | ICD-10-CM

## 2020-06-06 NOTE — Telephone Encounter (Signed)
-----   Message from Lattie Haw, MD sent at 05/30/2020 11:55 AM EDT ----- Regarding: Breast MRI Hi Team! Sonya Walton needs an MRI breast due to abnormal breast ultrasound. I have ordered this and let her know. Please could you kindly arrange an appointment on the next available date and let her know. She will follow up with me for the results. Thank you! [place]

## 2020-06-06 NOTE — Telephone Encounter (Signed)
Ms Connelley has been contacted by GI. Pt does not have insurance and has filled out paperwork for financial assistance. Pt will call GI and schedule appt once FA has been approved. Ottis Stain, CMA

## 2020-06-27 ENCOUNTER — Telehealth: Payer: Self-pay | Admitting: Family Medicine

## 2020-06-27 NOTE — Telephone Encounter (Signed)
[place]  form dropped off for at front desk for completion.  Verified that patient section of form has been completed.  Last DOS/WCC with PCP was06/05/2020  Placed form in team folder to be completed by clinical staff.  Sonya Walton

## 2020-06-30 NOTE — Telephone Encounter (Signed)
Clinical info completed on [employer] Medication assistance form.  Place form in Dr. Serita Grit box for completion.  Sonya Walton, CMA

## 2020-07-21 ENCOUNTER — Ambulatory Visit (INDEPENDENT_AMBULATORY_CARE_PROVIDER_SITE_OTHER): Payer: Self-pay | Admitting: Family Medicine

## 2020-07-21 ENCOUNTER — Other Ambulatory Visit: Payer: Self-pay

## 2020-07-21 ENCOUNTER — Encounter: Payer: Self-pay | Admitting: Family Medicine

## 2020-07-21 VITALS — BP 128/72 | HR 87 | Wt 219.2 lb

## 2020-07-21 DIAGNOSIS — E1165 Type 2 diabetes mellitus with hyperglycemia: Secondary | ICD-10-CM

## 2020-07-21 LAB — POCT GLYCOSYLATED HEMOGLOBIN (HGB A1C): HbA1c, POC (controlled diabetic range): 8.9 % — AB (ref 0.0–7.0)

## 2020-07-21 MED ORDER — TRULICITY 3 MG/0.5ML ~~LOC~~ SOPN
3.0000 mg | PEN_INJECTOR | SUBCUTANEOUS | 0 refills | Status: AC
Start: 2020-07-21 — End: 2020-08-20

## 2020-07-21 NOTE — Patient Instructions (Addendum)
Great to see you today!! Your diabetes is slightly worse this time. Please keep increasing your basaglar 1 unit each day until your sugars are 150 and below.  I have increased your trulicity to 3mg . We will wait to hear back from [place] cares about this drug. For now continue the 1.5mg  you have at home.  We have booked a follow up on Thursday 23rd September 10:30am with Dr Valentina Lucks.  Best wishes,  Dr Posey Pronto

## 2020-07-21 NOTE — Progress Notes (Signed)
    SUBJECTIVE:   CHIEF COMPLAINT / HPI:   Sonya Walton is a 59 yr old female who presents today for diabetic follow up  Diabetes Pt is taking Trulicity 1.5mg  and Basaglar 18 units and tolerating well without side effects. CBGs have been 105-260. Overall have been running high due to poor diet-snacking a lot on sugar/carb based foods. 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 [employer] [facility] helps pt pay for her medications.  PERTINENT  PMH / PSH: DM, HTN, OA, HLD  OBJECTIVE:   BP 128/72   Pulse 87   Wt 219 lb 3.2 oz (99.4 kg)   SpO2 95%   BMI 40.09 kg/m    General: Alert, no acute distress Cardio: well perfused  Pulm: Normal respiratory effort Neuro: Cranial nerves grossly intact  ASSESSMENT/PLAN:   Diabetes mellitus with hyperglycemia ([place]) Poorly controlled 2/2 poor diet choices. A1c 8.9 today. Recommended increasing Basaglar by 1 unit each day from 18 units until CBGs <150. Increased Trulicity to 3mg  however both patient and I are unsure how she will receive this medication as [facility] funds her meds.until she receives this medication she can continue Trulicity 1.5mg . Arranged follow up with Dr Valentina Lucks later this week to help with further medication management. Encouraged to reduce sugary foods,drinks and increase exercise which will also help manage patient's CBGs. Follow up in 3 months.   Obtained yearly BMP and cholesterol panel today which were wnl.  Lattie Haw, MD PGY-2 [place]

## 2020-07-22 LAB — BASIC METABOLIC PANEL
BUN/Creatinine Ratio: 12 (ref 12–28)
BUN: 9 mg/dL (ref 8–27)
CO2: 28 mmol/L (ref 20–29)
Calcium: 9.4 mg/dL (ref 8.7–10.3)
Chloride: 101 mmol/L (ref 96–106)
Creatinine, Ser: 0.74 mg/dL (ref 0.57–1.00)
GFR calc Af Amer: 102 mL/min/{1.73_m2} (ref >59)
GFR calc non Af Amer: 88 mL/min/{1.73_m2} (ref >59)
Glucose: 223 mg/dL — ABNORMAL HIGH (ref 65–99)
Potassium: 3.5 mmol/L (ref 3.5–5.2)
Sodium: 139 mmol/L (ref 134–144)

## 2020-07-22 LAB — LIPID PANEL
Chol/HDL Ratio: 3.4 ratio (ref 0.0–4.4)
Cholesterol, Total: 156 mg/dL (ref 100–199)
HDL: 46 mg/dL (ref >39)
LDL Chol Calc (NIH): 90 mg/dL (ref 0–99)
Triglycerides: 108 mg/dL (ref 0–149)
VLDL Cholesterol Cal: 20 mg/dL (ref 5–40)

## 2020-07-23 NOTE — Assessment & Plan Note (Signed)
Poorly controlled 2/2 poor diet choices. A1c 8.9 today. Recommended increasing Basaglar by 1 unit each day from 18 units until CBGs <150. Increased Trulicity to 3mg  however both patient and I are unsure how she will receive this medication as [facility] funds her meds.until she receives this medication she can continue Trulicity 1.5mg . Arranged follow up with Dr Valentina Lucks later this week to help with further medication management. Encouraged to reduce sugary foods,drinks and increase exercise which will also help manage patient's CBGs. Follow up in 3 months.

## 2020-07-24 ENCOUNTER — Encounter: Payer: Self-pay | Admitting: Pharmacist

## 2020-07-24 ENCOUNTER — Ambulatory Visit (INDEPENDENT_AMBULATORY_CARE_PROVIDER_SITE_OTHER): Payer: Self-pay | Admitting: Pharmacist

## 2020-07-24 ENCOUNTER — Other Ambulatory Visit: Payer: Self-pay

## 2020-07-24 DIAGNOSIS — E1165 Type 2 diabetes mellitus with hyperglycemia: Secondary | ICD-10-CM

## 2020-07-24 NOTE — Assessment & Plan Note (Signed)
Diabetes longstanding currently uncontrolled. Most recent A1c above goal of < 7 and increased from prior. Patient was able to verbalize appropriate hypoglycemia management plan. Medication adherence reported, tolerating current regimen. Most recently started increasing Basaglar 1 unit per day until BG < 150  per PCP, now taking 20 units daily. Control is suboptimal due to dietary snacking, lack of exercise, increasing weight, stress as a caregiver. -Increased dose of GLP-1 Trulicity (dulaglutide) to 3 mg once weekly on Thursday. Fax sent for [employer] Patient Assistance. -Continued basal insulin Basaglar (insulin glargine) 20 units daily (do not keep increasing) UNTIL patient receives and starts increased dose of Trulicity, THEN decrease Basaglar to 16 units daily -Extensively discussed pathophysiology of diabetes, recommended lifestyle interventions, dietary effects on blood sugar control -Counseled on s/sx of and management of hypoglycemia -Next A1C anticipated 10/2020.

## 2020-07-24 NOTE — Patient Instructions (Addendum)
Great to meet you today!  Continue Basaglar 20 units daily UNTIL you increase your Trulicity, THEN decrease to 16 units daily.  When you receive your new Trulicity, start injecting 3 mg once weekly on Thursdays.  Continue to check your blood sugar daily in the morning before meals or drinks.  Call the [facility] if you have any morning blood sugars < 100.  Remember to treat a low blood sugar < 70 with 15 grams of sugar (3-4 glucose tablets) and check your blood sugar again in 15 minutes. See below for more details on what we discussed about treating low blood sugars.   Hypoglycemia Hypoglycemia is when the sugar (glucose) level in your blood is too low. Signs of low blood sugar may include:  Feeling: ? Hungry. ? Worried or nervous (anxious). ? Sweaty and clammy. ? Confused. ? Dizzy. ? Sleepy. ? Sick to your stomach (nauseous).  Having: ? A fast heartbeat. ? A headache. ? A change in your vision. ? Tingling or no feeling (numbness) around your mouth, lips, or tongue. ? Jerky movements that you cannot control (seizure).  Having trouble with: ? Moving (coordination). ? Sleeping. ? Passing out (fainting). ? Getting upset easily (irritability). Low blood sugar can happen to people who have diabetes and people who do not have diabetes. Low blood sugar can happen quickly, and it can be an emergency. Treating low blood sugar Low blood sugar is often treated by eating or drinking something sugary right away, such as:  Fruit juice, 4-6 oz (120-150 mL).  Regular soda (not diet soda), 4-6 oz (120-150 mL).  Low-fat milk, 4 oz (120 mL).  Several pieces of hard candy.  Sugar or honey, 1 Tbsp (15 mL). Treating low blood sugar if you have diabetes If you can think clearly and swallow safely, follow the 15:15 rule:  Take 15 grams of a fast-acting carb (carbohydrate). Talk with your doctor about how much you should take.  Always keep a source of fast-acting carb with  you, such as: ? Sugar tablets (glucose pills). Take 3-4 pills. ? 6-8 pieces of hard candy. ? 4-6 oz (120-150 mL) of fruit juice. ? 4-6 oz (120-150 mL) of regular (not diet) soda. ? 1 Tbsp (15 mL) honey or sugar.  Check your blood sugar 15 minutes after you take the carb.  If your blood sugar is still at or below 70 mg/dL (3.9 mmol/L), take 15 grams of a carb again.  If your blood sugar does not go above 70 mg/dL (3.9 mmol/L) after 3 tries, get help right away.  After your blood sugar goes back to normal, eat a meal or a snack within 1 hour.  Treating very low blood sugar If your blood sugar is at or below 54 mg/dL (3 mmol/L), you have very low blood sugar (severe hypoglycemia). This may also cause:  Passing out.  Jerky movements you cannot control (seizure).  Losing consciousness (coma). This is an emergency. Do not wait to see if the symptoms will go away. Get medical help right away. Call your local emergency services (911 in the [place] Do not drive yourself to the hospital. If you have very low blood sugar and you cannot eat or drink, you may need a glucagon shot (injection). A family member or friend should learn how to check your blood sugar and how to give you a glucagon shot. Ask your doctor if you need to have a glucagon shot kit at home. Follow these instructions at home: General  instructions  Take over-the-counter and prescription medicines only as told by your doctor.  Stay aware of your blood sugar as told by your doctor.  Limit alcohol intake to no more than 1 drink a day for nonpregnant women and 2 drinks a day for men. One drink equals 12 oz of beer (355 mL), 5 oz of wine (148 mL), or 1 oz of hard liquor (44 mL).  Keep all follow-up visits as told by your doctor. This is important. If you have diabetes:   Follow your diabetes care plan as told by your doctor. Make sure you: ? Know the signs of low blood sugar. ? Take your medicines as told. ? Follow your  exercise and meal plan. ? Eat on time. Do not skip meals. ? Check your blood sugar as often as told by your doctor. Always check it before and after exercise. ? Follow your sick day plan when you cannot eat or drink normally. Make this plan ahead of time with your doctor.  Share your diabetes care plan with: ? Your work or school. ? People you live with.  Check your pee (urine) for ketones: ? When you are sick. ? As told by your doctor.  Carry a card or wear jewelry that says you have diabetes. Contact a doctor if:  You have trouble keeping your blood sugar in your target range.  You have low blood sugar often. Get help right away if:  You still have symptoms after you eat or drink something sugary.  Your blood sugar is at or below 54 mg/dL (3 mmol/L).  You have jerky movements that you cannot control.  You pass out. These symptoms may be an emergency. Do not wait to see if the symptoms will go away. Get medical help right away. Call your local emergency services (911 in the [place] Do not drive yourself to the hospital. Summary  Hypoglycemia happens when the level of sugar (glucose) in your blood is too low.  Low blood sugar can happen to people who have diabetes and people who do not have diabetes. Low blood sugar can happen quickly, and it can be an emergency.  Make sure you know the signs of low blood sugar and know how to treat it.  Always keep a source of sugar (fast-acting carb) with you to treat low blood sugar. This information is not intended to replace advice given to you by your health care provider. Make sure you discuss any questions you have with your health care provider. Document Revised: 02/08/2019 Document Reviewed: 11/21/2015 Elsevier Patient Education  [place]. [id number]

## 2020-07-24 NOTE — Progress Notes (Signed)
S:     Chief Complaint  Patient presents with  . Medication Management    Diabetes    Patient arrives in good spirits, ambulating without assistance.  Presents for diabetes evaluation, education, and management Patient was referred and last seen by Primary Care Provider on 07/21/20.   Insurance coverage/medication affordability: non-insured  Medication adherence reported, denies missed doses. Current diabetes medications include: Trulicity 1.5 mg once weekly on Thursdays, Basaglar (insulin glargine) 20 units daily Current hypertension medications include: atenolol-chlorthalidone 50/25 mg daily Current hyperlipidemia medications include: none  Patient denies hypoglycemic events. Symptoms include nervousness, sweating. Carries glucose tablets with her.  Patient reported dietary habits: Eats 3 meals/day Reports snacking after dinner (cheese and crackers, cookies).  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 Most recently purchased a treadmill.   Patient reports nocturia (nighttime urination) once nightly.  Patient denies neuropathy (nerve pain). Patient denies visual changes. Patient reports self foot exams.   Patient denies nausea, diarrhea, early satiety, changes in appetite.    O:  Physical Exam Vitals reviewed.  Constitutional:      Appearance: Normal appearance.  Pulmonary:     Effort: Pulmonary effort is normal.  Neurological:     Mental Status: She is alert.  Psychiatric:        Mood and Affect: Mood normal.        Behavior: Behavior normal.        Thought Content: Thought content normal.        Judgment: Judgment normal.      Review of Systems  All other systems reviewed and are negative.    Lab Results  Component Value Date   HGBA1C 8.9 (A) 07/21/2020   Vitals:   07/24/20 1054  BP: 126/64  Pulse: 71  SpO2: 97%    Lipid Panel     Component Value Date/Time   CHOL 156  07/21/2020 1602   TRIG 108 07/21/2020 1602   TRIG 192 10/05/2012 1817   HDL 46 07/21/2020 1602   CHOLHDL 3.4 07/21/2020 1602   CHOLHDL 3.6 04/12/2016 0953   VLDL 24 04/12/2016 0953   LDLCALC 90 07/21/2020 1602   LDLCALC 95 10/05/2012 1817    Home fasting blood sugars: 219, 142, 171, 162, 244, 146, 160, 164, 190, 166, 264, 160, 148, 163   Clinical Atherosclerotic Cardiovascular Disease (ASCVD): No  The 10-year ASCVD risk score Mikey Bussing DC Jr., et al., 2013) is: 7.6%   Values used to calculate the score:     Age: 59 years     Sex: Female     Is Non-Hispanic African American: No     Diabetic: Yes     Tobacco smoker: No     Systolic Blood Pressure: 270 mmHg     Is BP treated: Yes     HDL Cholesterol: 46 mg/dL     Total Cholesterol: 156 mg/dL    A/P: Diabetes longstanding currently uncontrolled. Most recent A1c above goal of < 7 and increased from prior. Patient was able to verbalize appropriate hypoglycemia management plan. Medication adherence reported, tolerating current regimen. Most recently started increasing Basaglar 1 unit per day until BG < 150  per PCP, now taking 20 units daily. Control is suboptimal due to dietary snacking, lack of exercise, increasing weight, stress as a caregiver. -Increased dose of GLP-1 Trulicity (dulaglutide) to 3 mg once weekly on Thursday. Fax sent for [facility] Patient Assistance. -Continued basal insulin Basaglar (insulin  glargine) 20 units daily (do not keep increasing) UNTIL patient receives and starts increased dose of Trulicity, THEN decrease Basaglar to 16 units daily -Extensively discussed pathophysiology of diabetes, recommended lifestyle interventions, dietary effects on blood sugar control -Counseled on s/sx of and management of hypoglycemia -Next A1C anticipated 10/2020.   ASCVD risk - primary prevention in patient with diabetes. Last LDL is controlled. ASCVD risk score is not >20%  - moderate intensity statin indicated. Aspirin is not  indicated.  - Patient not on a statin currently, will consider initiation of moderate intensity statin at future visits   Written patient instructions provided.  Total time in face to face counseling 30 minutes.   Follow up Pharmacist phone call in 3 weeks.   Patient seen with Fara Olden, PGY1 Pharmacy Resident

## 2020-07-25 NOTE — Progress Notes (Signed)
Reviewed: I agree with Dr. Koval's documentation and management. 

## 2020-07-28 ENCOUNTER — Telehealth: Payer: Self-pay | Admitting: Pharmacist

## 2020-07-28 NOTE — Telephone Encounter (Signed)
Thank you Dr Valentina Lucks appreciate it.

## 2020-07-28 NOTE — Telephone Encounter (Signed)
Patient called to share that she had contacted the [facility] patient assistance   She shared that they did not receive any fax.  Patient provided new FAX # [phone]  Refaxed the prescription for Trulicity 3mg  weekly.   Plan to follow-up by phone with patient RE control in 3 weeks.

## 2020-07-29 NOTE — Telephone Encounter (Signed)
Patient is re-enrolled in the [employer] for Trulicity 3mg  and Basaglar thru 07/29/21.  [employer] did confirm that the Trulicity 3mg  script was received and is being processed to be filled and shipped to patients home address.

## 2020-08-05 ENCOUNTER — Telehealth: Payer: Self-pay

## 2020-08-05 NOTE — Telephone Encounter (Signed)
Pt should dispose of old trulicity once she has new 3mg  injections. Basaglar 16mg  once a day per Dr Valentina Lucks. She can dispose of the old Trulicity injection pens which are at a lower dose than 3mg .

## 2020-08-05 NOTE — Telephone Encounter (Signed)
Patient calls nurse line regarding questions with Trulicity. Patient requesting clarification on medication changes with new dosage of trulicity. Provided information per OV note with Dr. Valentina Lucks on 9/23 for patient to decrease Basaglar from 20 to 16 once new Trulicity dosage arrives and to start injecting 3 mg Trulicity weekly.   Patient also has questions regarding disposal of old dosage. Patient states that she will have three boxes of unopened trulicity.   Please advise how patient should proceed with disposal  Talbot Grumbling, RN

## 2020-08-07 NOTE — Telephone Encounter (Signed)
Called and informed patient of below. Patient reports starting new dosage today. No further questions at this time.   Talbot Grumbling, RN

## 2020-08-18 ENCOUNTER — Other Ambulatory Visit: Payer: Self-pay | Admitting: Acute Care

## 2020-08-18 ENCOUNTER — Other Ambulatory Visit: Payer: Self-pay | Admitting: *Deleted

## 2020-08-18 ENCOUNTER — Encounter: Payer: Self-pay | Admitting: Acute Care

## 2020-08-18 ENCOUNTER — Other Ambulatory Visit: Payer: Self-pay

## 2020-08-18 DIAGNOSIS — Z122 Encounter for screening for malignant neoplasm of respiratory organs: Secondary | ICD-10-CM

## 2020-08-18 DIAGNOSIS — Z87891 Personal history of nicotine dependence: Secondary | ICD-10-CM

## 2020-08-18 NOTE — Patient Instructions (Signed)
Thank you for participating in the [facility]. It was our pleasure to meet you today. We will call you with the results of your scan within the next few days. Your scan will be assigned a Lung RADS category score by the physicians reading the scans.  This Lung RADS score determines follow up scanning.  See below for description of categories, and follow up screening recommendations. We will be in touch to schedule your follow up screening annually or based on recommendations of our providers. We will fax a copy of your scan results to your Primary Care Physician, or the physician who referred you to the program, to ensure they have the results. Please call the office if you have any questions or concerns regarding your scanning experience or results.  Our office number is [phone]. Please speak with Denise Phelps, RN. She is our Lung Cancer Screening RN. If she is unavailable when you call, please have the office staff send her a message. She will return your call at her earliest convenience. Remember, if your scan is normal, we will scan you annually as long as you continue to meet the criteria for the program. (Age 55-77, Current smoker or smoker who has quit within the last 15 years). If you are a smoker, remember, quitting is the single most powerful action that you can take to decrease your risk of lung cancer and other pulmonary, breathing related problems. We know quitting is hard, and we are here to help.  Please let us know if there is anything we can do to help you meet your goal of quitting. If you are a former smoker, congratulations. We are proud of you! Remain smoke free! Remember you can refer friends or family members through the number above.  We will screen them to make sure they meet criteria for the program. Thank you for helping us take better care of you by participating in Lung Screening.  Lung RADS Categories:  Lung RADS 1: no nodules  or definitely non-concerning nodules.  Recommendation is for a repeat annual scan in 12 months.  Lung RADS 2:  nodules that are non-concerning in appearance and behavior with a very low likelihood of becoming an active cancer. Recommendation is for a repeat annual scan in 12 months.  Lung RADS 3: nodules that are probably non-concerning , includes nodules with a low likelihood of becoming an active cancer.  Recommendation is for a 6-month repeat screening scan. Often noted after an upper respiratory illness. We will be in touch to make sure you have no questions, and to schedule your 6-month scan.  Lung RADS 4 A: nodules with concerning findings, recommendation is most often for a follow up scan in 3 months or additional testing based on our provider's assessment of the scan. We will be in touch to make sure you have no questions and to schedule the recommended 3 month follow up scan.  Lung RADS 4 B:  indicates findings that are concerning. We will be in touch with you to schedule additional diagnostic testing based on our provider's  assessment of the scan.   

## 2020-08-18 NOTE — Progress Notes (Signed)
Shared Decision-Making Visit for Lung Cancer Screening [phone] )  Lung Cancer Screening Criteria 58-5-years of age 60+ pack year smoking history No Recent History of coughing up blood   No Unexplained weight loss of > 15 pounds in the last 6 months. No Prior History Lung / other cancer  (Diagnosis within the last 5 years already requiring surveillance chest CT Scans). Pt is a current smoker, or former smoker who has quit within the last 15 years.  This patient meets the criterial noted above and is asymptomatic for any signs or symptoms of lung cancer.  The Shared Decision-Making Visit discussion included risks and benefits of screening, potential for follow up diagnostic testing for abnormal scans, potential for false positive tests, over diagnosis, and discussion about total radiation exposure. Patient stated willingness to undergo diagnostics and treatment as needed. Current smokers were counseled on smoking cessation as the single most powerful action they can take to decrease their risk of lung cancer, pulmonary disease, heart disease and stroke. They were given a resource card with information on receiving free nicotine replacement therapy , and information about free smoking cessation classes.  Pt understands that this scan is being paid for by a grant obtained by the [facility]. We discussed  that there is no guarantee of additional grant money from year to year as these grants are not guaranteed to programs on an annual basis.They have to be applied for and they are awarded based on county need, and utilization of the previous years funds..   37 pack year smoking history No hemoptysis No unexplained weight loss Family history of cancer in Aunt,  Brother and sister Verbalized understanding of the above  Ticket 3, and Ticket to ride the scanner will be mailed to patient's home. She has verbalized understanding of the above.   Magdalen Spatz, MSN, AGACNP-BC [place] for personal pager PCCM on call pager [phone] 08/18/2020 4:49 PM

## 2020-08-21 ENCOUNTER — Telehealth: Payer: Self-pay | Admitting: Pharmacist

## 2020-08-21 NOTE — Telephone Encounter (Signed)
Noted and agree. 

## 2020-08-21 NOTE — Telephone Encounter (Addendum)
Called patient to discuss progress with Trulicity. She has tolerated the titration to 3mg  well and denies adverse effects and missed doses. She has approximately 4 months' worth of pens on hand and hasn't had any difficulty affording/receiving the medication. She also continues on Basaglar (insulin glargine) 16 units once daily.   She checks her blood sugar every morning around 7am and typically has readings between 130-150. Once she had a reading in the 90s and she was able to correct it with a drink and snack. She denies symptoms of high or low blood sugars.   She occassionally walks around her neighborhood and just joined a gym which she plans to visit twice a week with her sister.    Plan to continue current regimen without changes and follow up via phone call in 4 weeks.  Phone conversation conducted by:  Mercy Riding, PharmD PGY-1 Acute Care Pharmacy Resident

## 2020-08-22 NOTE — Telephone Encounter (Signed)
Noted and agreed thanks Dr Valentina Lucks.

## 2020-09-04 ENCOUNTER — Ambulatory Visit: Payer: Self-pay | Admitting: Podiatry

## 2020-09-05 ENCOUNTER — Ambulatory Visit
Admission: RE | Admit: 2020-09-05 | Discharge: 2020-09-05 | Disposition: A | Discharge status: Home / Self Care | Payer: No Typology Code available for payment source | Source: Ambulatory Visit | Attending: Acute Care | Admitting: Acute Care

## 2020-09-05 DIAGNOSIS — Z87891 Personal history of nicotine dependence: Secondary | ICD-10-CM

## 2020-09-11 ENCOUNTER — Telehealth: Payer: Self-pay | Admitting: Acute Care

## 2020-09-11 NOTE — Progress Notes (Signed)
I have called the patient with the results of her low-dose CT.  I explained that her scan was read as a lung RADS 4A, suspicious irregular ill-defined peripheral opacity in the right lung, possibly postinfectious/inflammatory scarring.  Recommendation is for 4-month follow-up. Additionally there was notation of a left thyroid enlargement with a 2.7 cm posterior left thyroid nodule.  Recommendation was for an ultrasound of the thyroid. Both of these results were discussed with the patient who verbalized understanding.  She is in agreement with a 15-month follow-up to reevaluate the area noted in her lung.  She does deny any sick symptoms at present. Denise please place order for 63-month follow-up low-dose CT   Dr. Posey Pronto, as you know this patient well please consider ordering ultrasound of thyroid to evaluate the left thyroid enlargement noted on the scan. Please do not hesitate to call me if you have any questions or concerns

## 2020-09-11 NOTE — Telephone Encounter (Signed)
Spoke with the pt  She is wanting her ct chest lung ca screen to be faxed to Dr Janeann Forehand  Results printed and faxed to him at [phone] Nothing further needed

## 2020-09-15 ENCOUNTER — Telehealth: Payer: Self-pay | Admitting: *Deleted

## 2020-09-15 ENCOUNTER — Other Ambulatory Visit: Payer: Self-pay | Admitting: Family Medicine

## 2020-09-15 ENCOUNTER — Other Ambulatory Visit: Payer: Self-pay | Admitting: *Deleted

## 2020-09-15 DIAGNOSIS — Z87891 Personal history of nicotine dependence: Secondary | ICD-10-CM

## 2020-09-15 DIAGNOSIS — E079 Disorder of thyroid, unspecified: Secondary | ICD-10-CM

## 2020-09-15 NOTE — Telephone Encounter (Signed)
-----   Message from Lattie Haw, MD sent at 09/15/2020  2:00 PM EST ----- Regarding: thyroid US and ATC visit Hi team! I have booked an US thyroid for this patient at [facility] imaging. I was not sure if she could walk in or needs an appointment. Please could you let the patient know and schedule the [place] if it needs to be scheduled.  I have ATC on Tuesday 30th Nov, please could you schedule a follow up with me on this day?  Thanks very much, appreciate it! [place]

## 2020-09-17 NOTE — Telephone Encounter (Signed)
Patient calls nurse line regarding thyroid US. Patient has scheduled [place] on 12/3 at 1115. Please advise if f/u appointment is still appropriate for 11/30 or if it should be scheduled following the [place].   To PCP  Talbot Grumbling, RN

## 2020-09-17 NOTE — Telephone Encounter (Signed)
Thank you Jarrett Soho. We can schedule the clinic app after the thyroid US thank you!

## 2020-09-18 ENCOUNTER — Telehealth: Payer: Self-pay

## 2020-09-18 NOTE — Telephone Encounter (Signed)
Noted and agree. 

## 2020-09-18 NOTE — Telephone Encounter (Signed)
Called patient to discuss progress with Trulicity 3mg  once weekly.  She is still tolerating this medication well and denies adverse effects.  She also continues on Basaglar (insulin glargine) 16 units daily.    She checks her blood sugar around 7am every morning and her readings are typically between 128-150's.  Had a couple readings in the 160's.  She denies any symptoms of hyperglycemia.  Patient reports one episode where her readings was in the 90's and she felt dizzy- she ate a snack and felt better.  She attributes her lower reading to not having a snack throughout the day.    She continues to go to the gym around 2 times per week with her sister. Likes to walk on the treadmill or ride on the elliptical.   Plan to continue current regimen without changes.  Encouraged patient to contact pharmacy if she experiences multiple readings below 100.   Phone conversation conducted by: Dimple Nanas, PharmD PGY1 Acute Care Pharmacy Resident

## 2020-09-19 NOTE — Telephone Encounter (Signed)
error 

## 2020-09-23 NOTE — Telephone Encounter (Signed)
Called patient and scheduled OV follow up on 12/6 at 1600.  Talbot Grumbling, RN

## 2020-10-03 ENCOUNTER — Ambulatory Visit
Admission: RE | Admit: 2020-10-03 | Discharge: 2020-10-03 | Disposition: A | Discharge status: Home / Self Care | Payer: 59 | Source: Ambulatory Visit | Attending: Family Medicine | Admitting: Family Medicine

## 2020-10-03 DIAGNOSIS — E079 Disorder of thyroid, unspecified: Secondary | ICD-10-CM

## 2020-10-05 NOTE — Progress Notes (Signed)
     SUBJECTIVE:   CHIEF COMPLAINT / HPI:   Sonya Walton is a 59 y.o. female presents for follow up for thyroid ultrasound  Thyroid US results Explained results of recent US thyroid for thyroid nodule which was found incidentally on CT chest. She needs follow up US in one year  Weight gain Has gained at least 16lb in the last year due to excessive caloric intake. A long with this her A1c has worsened to 8.9 recently. Would like to go to [place] for weight loss management.    Health maintenance  Covid vaccine: up to date Flu vaccine: up to date  Pneumococcal vaccine: pending   PERTINENT  PMH / PSH: HTN, DM  OBJECTIVE:   BP 100/60   Pulse 88   Ht 5\' 4"  (1.626 m)   Wt 220 lb 6 oz (100 kg)   SpO2 96%   BMI 37.83 kg/m    General: Alert, no acute distress, obese Cardio: Normal S1 and S2, RRR, no r/m/g Pulm: CTAB, normal work of breathing Abdomen: Bowel sounds normal. Abdomen soft and non-tender.  Extremities: No peripheral edema.  Neuro: Cranial nerves grossly intact   ASSESSMENT/PLAN:   Morbid obesity ([place]) Has gained 16lb in the last year. Referred to [facility] for weight loss management.   Thyroid nodule incidentally noted on imaging study Follow up thyroid US in 1 year.    Patient received pneumococcal vaccine today.   Lattie Haw, MD PGY-2 [place]

## 2020-10-06 ENCOUNTER — Other Ambulatory Visit: Payer: Self-pay

## 2020-10-06 ENCOUNTER — Encounter: Payer: Self-pay | Admitting: Family Medicine

## 2020-10-06 ENCOUNTER — Ambulatory Visit (INDEPENDENT_AMBULATORY_CARE_PROVIDER_SITE_OTHER): Payer: 59 | Admitting: Family Medicine

## 2020-10-06 DIAGNOSIS — Z23 Encounter for immunization: Secondary | ICD-10-CM

## 2020-10-06 DIAGNOSIS — E041 Nontoxic single thyroid nodule: Secondary | ICD-10-CM | POA: Diagnosis not present

## 2020-10-06 NOTE — Patient Instructions (Addendum)
Thank you for coming to see me today. It was a pleasure. I have referred you to healthy weight and wellness clinic. These are the contact details:  Address: [address], [place], [place] [address] Phone: [phone]  You can call and schedule an appointment too. Your thyroid ultrasound needs follow up in 1 years time. Please go to the CT chest appointment in the next 1 month.   Please follow-up in the next month for your diabetes.   If you have any questions or concerns, please do not hesitate to call the office at [phone].  If you develop fevers>100.5, shortness of breath, chest pain, palpitations, dizziness, abdominal pain, nausea, vomiting, diarrhea or cannot eat or drink then please go to the ER immediately.  Best wishes,   Dr Posey Pronto

## 2020-10-07 ENCOUNTER — Telehealth: Payer: Self-pay | Admitting: Acute Care

## 2020-10-07 DIAGNOSIS — E041 Nontoxic single thyroid nodule: Secondary | ICD-10-CM | POA: Insufficient documentation

## 2020-10-07 NOTE — Assessment & Plan Note (Signed)
Has gained 16lb in the last year. Referred to [facility] for weight loss management.

## 2020-10-07 NOTE — Assessment & Plan Note (Signed)
Follow up thyroid US in 1 year.

## 2020-10-07 NOTE — Telephone Encounter (Signed)
Spoke with pt, aware of recs.  Nothing further needed at this time- will close encounter.   

## 2020-10-07 NOTE — Telephone Encounter (Signed)
Called and spoke with patient, she states she has already had her thyroid U/S done and had her f/u done as well.  She is asking if she needs to schedule her f/u thyroid U/S or wait until after the first of the year.  She states the initial U/S was ordered by Eric Form NP.  Sarah, please advise.  Thank you.

## 2020-10-07 NOTE — Telephone Encounter (Signed)
Have her follow up based on Dr. Serita Grit recommendations. It looks like she recommends a 1 year follow up. This will be 10/2021. Thanks so much

## 2020-11-06 ENCOUNTER — Other Ambulatory Visit: Payer: Self-pay | Admitting: *Deleted

## 2020-11-06 ENCOUNTER — Telehealth: Payer: Self-pay | Admitting: Acute Care

## 2020-11-06 DIAGNOSIS — Z87891 Personal history of nicotine dependence: Secondary | ICD-10-CM

## 2020-11-06 NOTE — Telephone Encounter (Signed)
Pt returning call CB# [phone]

## 2020-11-06 NOTE — Telephone Encounter (Signed)
Spoke with pt and advised that I have spoken with Etheleen Sia with [facility] cancer outreach and she said that her CT will be covered by the donor funding. CT order has been placed and advised pt that if she doesn't get a call from [facility] to schedule to give them a call . I provided her with that number. Pt verbalized understanding. Nothing further needed at this time.

## 2020-11-06 NOTE — Telephone Encounter (Signed)
See other telephone note dated 11/06/20.

## 2020-11-06 NOTE — Telephone Encounter (Signed)
See other telephone note dated 11/06/20. Spoke with pt and advised that I am waiting to hear back from Sonya Sia, RN to see if we can order F/U Chest CT through the lung cancer screening grant. I advised pt that I will call her back once I have more info.  Please leave message in my box to follow up on.

## 2020-11-06 NOTE — Telephone Encounter (Signed)
Attempted to call. No answer and unable to leave message. Will call back.

## 2020-11-06 NOTE — Telephone Encounter (Signed)
I received a message from patient that she had some questions about the low dose CT she had done 09/05/2020. This scan was read as a Lung RADS 4 A : suspicious findings, either short term follow up in 3 months or alternatively  PET Scan evaluation may be considered when there is a solid component of  8 mm or larger. She is scheduled for a 3  Month follow up CT . She wanted me to know she no longer has insurance, an she does not know how this will be paid for. I have told her we will research grant opportunities, and that [place] RN will be in touch with her about this as an option. She verbalized understanding and had no further questions at completion of the call.    Langley Gauss, can we see if this will also be covered by the money that Altha Harm has ? Thanks so much.

## 2020-12-09 ENCOUNTER — Ambulatory Visit
Admission: RE | Admit: 2020-12-09 | Discharge: 2020-12-09 | Disposition: A | Discharge status: Home / Self Care | Payer: No Typology Code available for payment source | Source: Ambulatory Visit | Attending: Acute Care | Admitting: Acute Care

## 2020-12-09 DIAGNOSIS — Z87891 Personal history of nicotine dependence: Secondary | ICD-10-CM

## 2020-12-10 ENCOUNTER — Telehealth: Payer: Self-pay | Admitting: Acute Care

## 2020-12-10 NOTE — Telephone Encounter (Signed)
Will forward to [facility] NP for LDCT that is abn .

## 2020-12-10 NOTE — Telephone Encounter (Signed)
Thanks so much. This will be called through the screening program.

## 2020-12-11 ENCOUNTER — Telehealth: Payer: Self-pay | Admitting: Acute Care

## 2020-12-11 ENCOUNTER — Other Ambulatory Visit: Payer: Self-pay | Admitting: Acute Care

## 2020-12-11 DIAGNOSIS — R911 Solitary pulmonary nodule: Secondary | ICD-10-CM

## 2020-12-11 NOTE — Telephone Encounter (Signed)
I wanted to let you know that Sonya Walton has had an abnormal low dose CT. She had a scan 09/05/2020  that was read as a Lung RADS 4 A : suspicious findings, either short term follow up in 3 months or alternatively  PET Scan evaluation may be considered when there is a solid component of  8 mm or larger. Her 3 month follow up has been read as a Lung RADS 4 B indicates suspicious findings for which additional diagnostic testing and or tissue sampling is recommended. I have ordered PET scan and PFT's and we are setting her up to be seen by Dr. Lamonte Sakai.  I have called her and explained this to her. I wanted to make sure you were aware and that we are following up with further diagnostics.  Please don't hesitate if you have any additional questions. Thanks so much.

## 2020-12-11 NOTE — Progress Notes (Signed)
I have called the patient with the results of her low dose CT. I explained there has been further growth in the area we had been watching first noted on the 09/2020 scan. I explained that we need to do further diagnostics.  I told her I was going to order a PET scan and PFT's. She is aware she will be getting called to schedule these. Additionally I have contacted Dr. Lamonte Sakai. She will be scheduled to see him . He will revi ew diagnostics and they will develop a plan of care for continued treatment of this finding. Ihave sent a message to the patients PCP, so she is aware.

## 2020-12-12 ENCOUNTER — Ambulatory Visit (INDEPENDENT_AMBULATORY_CARE_PROVIDER_SITE_OTHER): Payer: Self-pay | Admitting: Family Medicine

## 2020-12-12 ENCOUNTER — Ambulatory Visit (HOSPITAL_COMMUNITY)
Admission: RE | Admit: 2020-12-12 | Discharge: 2020-12-12 | Disposition: A | Discharge status: Home / Self Care | Payer: No Typology Code available for payment source | Source: Ambulatory Visit | Attending: Family Medicine | Admitting: Family Medicine

## 2020-12-12 ENCOUNTER — Encounter: Payer: Self-pay | Admitting: Family Medicine

## 2020-12-12 ENCOUNTER — Other Ambulatory Visit: Payer: Self-pay

## 2020-12-12 VITALS — BP 108/58 | HR 83 | Ht 63.0 in | Wt 221.2 lb

## 2020-12-12 DIAGNOSIS — R079 Chest pain, unspecified: Secondary | ICD-10-CM

## 2020-12-12 DIAGNOSIS — I10 Essential (primary) hypertension: Secondary | ICD-10-CM

## 2020-12-12 DIAGNOSIS — R0789 Other chest pain: Secondary | ICD-10-CM | POA: Insufficient documentation

## 2020-12-12 DIAGNOSIS — R918 Other nonspecific abnormal finding of lung field: Secondary | ICD-10-CM

## 2020-12-12 DIAGNOSIS — E1165 Type 2 diabetes mellitus with hyperglycemia: Secondary | ICD-10-CM

## 2020-12-12 LAB — POCT GLYCOSYLATED HEMOGLOBIN (HGB A1C): Hemoglobin A1C: 8 % — AB (ref 4.0–5.6)

## 2020-12-12 MED ORDER — ATENOLOL-CHLORTHALIDONE 50-25 MG PO TABS: ORAL_TABLET | ORAL | 3 refills | Status: DC

## 2020-12-12 MED ORDER — NITROGLYCERIN 0.4 MG SL SUBL: 0.4000 mg | SUBLINGUAL_TABLET | SUBLINGUAL | 12 refills | Status: DC | PRN

## 2020-12-12 NOTE — Telephone Encounter (Signed)
Thanks for talking with her. We will keep you updated on the results of her scans and any further diagnostics. She is a very sweet patient.

## 2020-12-12 NOTE — Assessment & Plan Note (Signed)
Patient is worried about a serious outcome. I explained that given that the mass is increased in size and her smoking history it is good that we are investigating this further. She has upcoming PET scan, PFTs and follow-up with Dr. Lamonte Sakai at [facility] pulmonology.  Patient will follow up with me in 3 to 4 weeks after she has had these tests and follow-up with pulmonology.

## 2020-12-12 NOTE — Assessment & Plan Note (Signed)
A1c 8. Goal 7-8. Congratulated pt on her efforts and encouraged pt to keep up the great work. Continue Glargine 16 units daily and trulicity 3mg  weekly. Recheck in 3 months.

## 2020-12-12 NOTE — Telephone Encounter (Signed)
Hey Sonya Walton,thank you for getting in touch. I saw  Ms Trunnell today and we discussed the CT scan results. She is concerned about the lung mass but I said given her hx of smoking it is very good that we are investigating this further. She has all the tests and scans booked already. I will follow up with her after she has seen Dr Lamonte Sakai. Appreciate your in put. Please let me know if I can be of further help.

## 2020-12-12 NOTE — Telephone Encounter (Signed)
She is very sweet. Crossing my fingers for her.

## 2020-12-12 NOTE — Assessment & Plan Note (Signed)
Unclear etiology of chest pain. Differentials include GERD or costochondritis. Could be GERD as it is exacerbated by lying back and improved by sitting up. Could also be costochondritis as patient has a history of this however no tenderness on palpation today.  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  Vital signs wnl today. EKG: Normal sinus rhythm without ischemic changes. I provided patient with nitroglycerin tablets which she can trial if she gets the pain again.  I also referred her to cardiology today as she will likely need medical clearance anyway if she is going to have surgery for her lung mass.  ER precautions given to patient.

## 2020-12-12 NOTE — Progress Notes (Addendum)
SUBJECTIVE:   CHIEF COMPLAINT / HPI:   Sonya Walton is a 60 y.o. female presents for diabetes follow up    Diabetes Patient's current diabetic medications include dulaglutide and glargine. Tolerating well without side effects.  Patient endorses compliance with these medications. CBG readings averaging in the 120-180 range.  Patient's last A1c was  Lab Results  Component Value Date   HGBA1C 8.0 (A) 12/12/2020   HGBA1C 8.9 (A) 07/21/2020   HGBA1C 7.2 (A) 04/07/2020   Current A1c today is 8.  Denies abdominal pain, blurred vision, polyuria, polydipsia, hypoglycemia. Cut back on sugary foods. Patient states they understand that diet and exercise can help with her diabetes.  Last Microalbumin, LDL, Creatinine: Lab Results  Component Value Date   LDLCALC 90 07/21/2020   CREATININE 0.74 07/21/2020    CT lung mass, likely adenocarcinoma Recent CT chest: Lung RADS 4 B-irregular subsolid peripheral apical right upper lobe 3.1 x 2.0 cm lung mass increased from 2.6 x 1.5 cm .  Patient has been followed by pulmonology. She has a PET scan and PFT's in the next few weeks.  Chest tightness Left aching chest pain started 2 months. Onset was sudden. No radiation. worse at night but also happens during the day especially when laying flat. Lasts seconds. Alleviated by sitting up and deep breathing. Not related to exertion. 3/10 severity. Associated symptoms include  palpitations, dizziness, cough, dyspnea or fevers. Denies current chest pain.  PERTINENT  PMH / PSH: DM, thyroid nodule, lung mass  OBJECTIVE:   BP (!) 108/58   Pulse 83   Ht 5\' 3"  (1.6 m)   Wt 221 lb 3.2 oz (100.3 kg)   SpO2 95%   BMI 39.18 kg/m    General: Alert, no acute distress, well appearing, pleasant  Cardio: Normal S1 and S2, RRR, no r/m/g Pulm: CTAB, normal work of breathing Abdomen: Bowel sounds normal. Abdomen soft and non-tender.  Extremities: No peripheral edema.  Neuro: Cranial nerves grossly intact   MSK: No tenderness on palpation of left sided anterior chest wall.  ASSESSMENT/PLAN:   Diabetes mellitus with hyperglycemia (HCC) A1c 8. Goal 7-8. Congratulated pt on her efforts and encouraged pt to keep up the great work. Continue Glargine 16 units daily and trulicity 3mg  weekly. Recheck in 3 months.   Chest pain Unclear etiology of chest pain. Differentials include GERD or costochondritis. Could be GERD as it is exacerbated by lying back and improved by sitting up. Could also be costochondritis as patient has a history of this however no tenderness on palpation today.  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  Vital signs wnl today. EKG: Normal sinus rhythm without ischemic changes. I provided patient with nitroglycerin tablets which she can trial if she gets the pain again.  I also referred her to cardiology today as she will likely need medical clearance anyway if she is going to have surgery for her lung mass.  ER precautions given to patient.  Mass of right lung Patient is worried about a serious outcome. I explained that given that the mass is increased in size and her smoking history it is good that we are investigating this further. She has upcoming PET scan, PFTs and follow-up with Dr. Lamonte Sakai at [facility] pulmonology.  Patient will follow up with me in  3 to 4 weeks after she has had these tests and follow-up with pulmonology.     Lattie Haw, MD PGY-2 [place]

## 2020-12-12 NOTE — Patient Instructions (Addendum)
Thank you for coming to see me today. It was a pleasure. Your A1c is improved to 8. Congratulations you should be so proud. No changes made to your meds today.    I recommend follow up with pulmonology about the mass in your lungs.  Your chest pain could be related to your heart or stomach. I have given you some nitroglycerin to spray under your tongue. I am also referring you to cardiologist. If you get worsening chest tightness, breathing troubles, dizziness, sweating etc then go to the ER.  Please follow-up with me in 3-4 weeks.  If you have any questions or concerns, please do not hesitate to call the office at [phone].  Best wishes,   Dr Posey Pronto

## 2020-12-15 ENCOUNTER — Other Ambulatory Visit (HOSPITAL_COMMUNITY)
Admission: RE | Admit: 2020-12-15 | Discharge: 2020-12-15 | Disposition: A | Discharge status: Home / Self Care | Payer: HRSA Program | Source: Ambulatory Visit | Attending: Acute Care | Admitting: Acute Care

## 2020-12-15 DIAGNOSIS — Z01812 Encounter for preprocedural laboratory examination: Secondary | ICD-10-CM | POA: Diagnosis present

## 2020-12-15 DIAGNOSIS — Z20822 Contact with and (suspected) exposure to covid-19: Secondary | ICD-10-CM | POA: Diagnosis not present

## 2020-12-16 LAB — SARS CORONAVIRUS 2 (TAT 6-24 HRS): SARS Coronavirus 2: NEGATIVE

## 2020-12-18 ENCOUNTER — Ambulatory Visit (INDEPENDENT_AMBULATORY_CARE_PROVIDER_SITE_OTHER): Payer: Self-pay | Admitting: Emergency Medicine

## 2020-12-18 ENCOUNTER — Other Ambulatory Visit: Payer: Self-pay

## 2020-12-18 DIAGNOSIS — R911 Solitary pulmonary nodule: Secondary | ICD-10-CM

## 2020-12-18 LAB — PULMONARY FUNCTION TEST
DL/VA % pred: 164 %
DL/VA: 6.98 ml/min/mmHg/L
DLCO cor % pred: 134 %
DLCO cor: 26.37 ml/min/mmHg
DLCO unc % pred: 134 %
DLCO unc: 26.37 ml/min/mmHg
FEF 25-75 Post: 1.32 L/sec
FEF 25-75 Pre: 1.46 L/sec
FEF2575-%Change-Post: -9 %
FEF2575-%Pred-Post: 57 %
FEF2575-%Pred-Pre: 63 %
FEV1-%Change-Post: -2 %
FEV1-%Pred-Post: 65 %
FEV1-%Pred-Pre: 66 %
FEV1-Post: 1.61 L
FEV1-Pre: 1.64 L
FEV1FVC-%Change-Post: 0 %
FEV1FVC-%Pred-Pre: 100 %
FEV6-%Change-Post: -2 %
FEV6-%Pred-Post: 66 %
FEV6-%Pred-Pre: 67 %
FEV6-Post: 2.04 L
FEV6-Pre: 2.09 L
FEV6FVC-%Pred-Post: 103 %
FEV6FVC-%Pred-Pre: 103 %
FVC-%Change-Post: -1 %
FVC-%Pred-Post: 64 %
FVC-%Pred-Pre: 65 %
FVC-Post: 2.05 L
FVC-Pre: 2.09 L
Post FEV1/FVC ratio: 78 %
Post FEV6/FVC ratio: 100 %
Pre FEV1/FVC ratio: 79 %
Pre FEV6/FVC Ratio: 100 %
RV % pred: 118 %
RV: 2.3 L
TLC % pred: 92 %
TLC: 4.55 L

## 2020-12-18 NOTE — Progress Notes (Signed)
Full PFT performed today. °

## 2020-12-22 ENCOUNTER — Encounter: Payer: Self-pay | Admitting: Emergency Medicine

## 2020-12-22 ENCOUNTER — Other Ambulatory Visit: Payer: Self-pay

## 2020-12-22 ENCOUNTER — Ambulatory Visit (INDEPENDENT_AMBULATORY_CARE_PROVIDER_SITE_OTHER): Payer: Self-pay | Admitting: Emergency Medicine

## 2020-12-22 DIAGNOSIS — R911 Solitary pulmonary nodule: Secondary | ICD-10-CM

## 2020-12-22 NOTE — Progress Notes (Signed)
Subjective:    Patient ID: Sonya Walton, female    DOB: 09-Apr-1961, 60 y.o.   MRN: [id number]  HPI 60 year old former smoker (37 pack years) with a history of diabetes, allergic rhinitis, hypertension, osteoarthritis. She is referred today for evaluation of an abnormal CT scan of the chest. She participates in the lung cancer screening program and had her most recent screening scan on 12/09/2020.  This was a 61-monthfollow-up scan from 09/05/2020 which had identified an ill-defined right upper lobe peripheral 2.6 x 1.5 cm density opacity.  As below the lesion of interest is increased in size.  A PET scan is pending tomorrow 12/23/2020.   She has a good functional capacity. She does get a little winded when bringing her trash uphill. Otherwise she isn't limited. No cough or wheeze. She does have chronic hoarse voice.   CT chest 12/09/2020 reviewed by me, shows mild emphysematous change, enlargement of an irregular subsolid apical right upper lobe 3.1 x 2.0 cm mass lesion.  I do not see any mediastinal adenopathy.   Pulmonary function testing 12/18/2020 reviewed by me, shows moderately severe mixed obstruction and restriction by spirometry without a bronchodilator response, FEV1 1.64 L, lung volumes falling to the normal range.  Diffusion capacity is elevated.  [facility] Past Medical History:  Diagnosis Date  . Allergy   . Arthritis   . Diabetes mellitus without complication ([place]   . Hoarse voice quality 01/19/2016  . Hypertension    states under control with med., has been on med. since 2011  . Immature cataract   . 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 Partial tear of right rotator cuff 12/2015  . Wears dentures    upper     Family History  Problem Relation Age of Onset  . Liver cancer Father   . Cancer Father        lung and liver  . Heart disease Mother   . Hyperlipidemia Mother   . Hypertension Mother   . Heart failure Mother   . Hypertension Other    . Diabetes Other   . Asthma Other   . Diabetes type II Sister   . Breast cancer Sister   . Asthma Sister   . COPD Sister   . Diabetes type II Brother   . Cancer Paternal Aunt        lung  . Hypertension Sister   . Hypertension Brother   . Diabetes Brother   . Asthma Brother   . Cancer Other        lung  . Colon polyps Neg Hx   . Esophageal cancer Neg Hx   . Rectal cancer Neg Hx   . Stomach cancer Neg Hx      Social History   Socioeconomic History  . Marital status: Widowed    Spouse name: Not on file  . Number of children: Not on file  . Years of education: Not on file  . Highest education level: 10th grade  Occupational History  . Not on file  Tobacco Use  . Smoking status: Former Smoker    Packs/day: 1.00    Years: 37.00    Pack years: 37.00    Types: Cigarettes    Start date: 11/01/1981    Quit date: 01/09/2018    Years since quitting: 2.9  . Smokeless tobacco: Never Used  . Tobacco comment: 1 ppd max use.   Vaping Use  . Vaping Use:  Never used  Substance and Sexual Activity  . Alcohol use: Yes    Alcohol/week: 0.0 standard drinks    Comment: rarely  . Drug use: No  . Sexual activity: Not Currently    Partners: Male    Birth control/protection: Surgical  Other Topics Concern  . Not on file  Social History Narrative   Previous [facility] patient, last seen 05/15/12      Works fulltime at [employer] at [employer]).  Completed some high school   Currently separated   Lives with her ssiter, Verdene Lennert, [place]            Social Determinants of Health   Financial Resource Strain: Not on file  Food Insecurity: Not on file  Transportation Needs: No Transportation Needs  . Lack of Transportation (Medical): No  . Lack of Transportation (Non-Medical): No  Physical Activity: Not on file  Stress: Not on file  Social Connections: Not on file  Intimate Partner Violence: Not on file    She has lived in [place], [place] Has worked in a warehouse,  housekeeping. Some chemical exposure No military.   Allergies  Allergen Reactions  . Percocet [Oxycodone-Acetaminophen] Other (See Comments)    Dysphoria     Outpatient Medications Prior to Visit  Medication Sig Dispense Refill  . acetaminophen (TYLENOL) 500 MG tablet Take 1,000 mg by mouth every 6 (six) hours as needed for mild pain or moderate pain (Takes PRN about twice weekly).    Marland Kitchen atenolol-chlorthalidone (TENORETIC) 50-25 MG tablet TAKE 1 TABLET BY MOUTH EVERY DAY 90 tablet 3  . blood glucose meter kit and supplies KIT Inject 1 each into the skin as directed. Use meter once daily to measure blood glucose. 1 each 0  . cetirizine (ZYRTEC) 10 MG chewable tablet Chew 10 mg by mouth daily.    . Cholecalciferol (VITAMIN D3) 2000 UNITS TABS Take 2,000 Units by mouth every morning.    . Dulaglutide 3 MG/0.5ML SOPN Inject 3 mg into the skin once a week.    Marland Kitchen glucose blood ([facility] ADVANCED GLUCOSE TEST) test strip Please specify directions, refills and quantity\ Once daily, [id number], 3 refills, 100 quantity Dr Lattie Haw MD 100 each 0  . Insulin Glargine (BASAGLAR KWIKPEN) 100 UNIT/ML SOPN Inject 0.22 mLs (22 Units total) into the skin daily. (Patient taking differently: Inject 16 Units into the skin daily. )    . Insulin Pen Needle (PEN NEEDLES) 32G X 6 MM MISC 1 each by Does not apply route daily. 50 each 6  . nitroGLYCERIN (NITROSTAT) 0.4 MG SL tablet Place 1 tablet (0.4 mg total) under the tongue every 5 (five) minutes as needed for chest pain. 30 tablet 12  . nystatin (NYSTATIN) powder Apply topically 4 (four) times daily. 15 g 0  . nystatin cream (MYCOSTATIN) Apply 1 application topically 2 (two) times daily. 30 g 0   Facility-Administered Medications Prior to Visit  Medication Dose Route Frequency Provider Last Rate Last Admin  . 0.9 %  sodium chloride infusion  500 mL Intravenous Once Cirigliano, Vito V, DO             Objective:   Physical Exam  Vitals:   12/22/20 1351   BP: 124/80  Pulse: 89  Temp: 98.3 F (36.8 C)  TempSrc: Temporal  SpO2: 97%  Weight: 221 lb 12.8 oz (100.6 kg)  Height: _0  (1.6 m)   Gen: Pleasant, obese woman, in no distress,  normal affect  ENT: No lesions,  mouth clear,  oropharynx clear, no postnasal drip, hoarse voice  Neck: No JVD, no stridor  Lungs: No use of accessory muscles, no crackles or wheezing on normal respiration, no wheeze on forced expiration  Cardiovascular: RRR, heart sounds normal, no murmur or gallops, no peripheral edema  Musculoskeletal: No deformities, no cyanosis or clubbing  Neuro: alert, awake, non focal  Skin: Warm, no lesions or rash      Assessment & Plan:  Pulmonary nodule 1 cm or greater in diameter Irregularly-shaped right upper lobe pulmonary nodule that looks like it has enlarged over a 55-monthinterval based on her CT 12/09/2020.  No evidence of mediastinal disease by CT.  Her pulmonary function testing would likely be acceptable for primary resection, looks like some mixed restriction and obstruction with an FEV1 of 1.64 L and an intact diffusion capacity.  She has a PET scan that is ordered for 2/22 but needs to be postponed because her insurance does not go into effect until this Friday.  We will reschedule today.  After the PET scan and based on results we will decide whether to refer to thoracic surgery for primary resection, possibly with ENB and dye marking if this is felt to be beneficial.  If there is suspicion for distant disease or concern about surgery then ENB for biopsies and a tissue diagnosis would be appropriate, probably also EBUS for mediastinal staging.  We will work on rescheduling your PET scan so that he can have it done after your insurance takes effect. We will discuss your PET scan results after performed, decide whether whether to pursue thoracic surgery referral or possible bronchoscopy depending on results. Follow with Dr BLamonte Sakaiin 1 month  RBaltazar Apo MD,  PhD 12/22/2020, 2:28 PM [facility] [phone] if no answer before 7:00PM call [phone] For any issues after 7:00PM please call eLink [phone]

## 2020-12-22 NOTE — Assessment & Plan Note (Signed)
Irregularly-shaped right upper lobe pulmonary nodule that looks like it has enlarged over a 71-month interval based on her CT 12/09/2020.  No evidence of mediastinal disease by CT.  Her pulmonary function testing would likely be acceptable for primary resection, looks like some mixed restriction and obstruction with an FEV1 of 1.64 L and an intact diffusion capacity.  She has a PET scan that is ordered for 2/22 but needs to be postponed because her insurance does not go into effect until this Friday.  We will reschedule today.  After the PET scan and based on results we will decide whether to refer to thoracic surgery for primary resection, possibly with ENB and dye marking if this is felt to be beneficial.  If there is suspicion for distant disease or concern about surgery then ENB for biopsies and a tissue diagnosis would be appropriate, probably also EBUS for mediastinal staging.  We will work on rescheduling your PET scan so that he can have it done after your insurance takes effect. We will discuss your PET scan results after performed, decide whether whether to pursue thoracic surgery referral or possible bronchoscopy depending on results. Follow with Dr Lamonte Sakai in 1 month

## 2020-12-22 NOTE — Patient Instructions (Signed)
We will work on rescheduling your PET scan so that he can have it done after your insurance takes effect. We will discuss your PET scan results after performed, decide whether whether to pursue thoracic surgery referral or possible bronchoscopy depending on results. Follow with Dr Lamonte Sakai in 1 month

## 2020-12-23 ENCOUNTER — Ambulatory Visit (HOSPITAL_COMMUNITY): Payer: Self-pay

## 2021-01-05 ENCOUNTER — Ambulatory Visit: Payer: No Typology Code available for payment source | Admitting: Family Medicine

## 2021-01-05 ENCOUNTER — Encounter (HOSPITAL_COMMUNITY)
Admission: RE | Admit: 2021-01-05 | Discharge: 2021-01-05 | Disposition: A | Discharge status: Home / Self Care | Payer: PRIVATE HEALTH INSURANCE | Source: Ambulatory Visit | Attending: Acute Care | Admitting: Acute Care

## 2021-01-05 ENCOUNTER — Other Ambulatory Visit: Payer: Self-pay

## 2021-01-05 DIAGNOSIS — R911 Solitary pulmonary nodule: Secondary | ICD-10-CM | POA: Diagnosis not present

## 2021-01-05 LAB — GLUCOSE, CAPILLARY: Glucose-Capillary: 160 mg/dL — ABNORMAL HIGH (ref 70–99)

## 2021-01-05 MED ORDER — FLUDEOXYGLUCOSE F - 18 (FDG) INJECTION
10.0000 | Freq: Once | INTRAVENOUS | Status: AC | PRN
  Administered 2021-01-05: 11.03 via INTRAVENOUS

## 2021-01-09 NOTE — Progress Notes (Signed)
This result will be discussed with Dr. Lamonte Sakai in the office 01/20/2021.

## 2021-01-12 NOTE — Progress Notes (Deleted)
Cardiology Office Note:    Date:  01/12/2021   ID:  Sonya Walton, DOB [birth year], MRN [id number]  PCP:  Sonya Haw, MD   [place]  Cardiologist:  No primary care provider on file.  Advanced Practice Provider:  No care team member to display Electrophysiologist:  None [id number]}   Referring MD: Sonya Resides, MD     History of Present Illness:    Sonya Walton is a 60 y.o. female with a hx of HTN, DMII, and osteoarthritis who was referred by Dr. Andria Walton for chest pain.  Past Medical History:  Diagnosis Date  . Allergy   . Arthritis   . Diabetes mellitus without complication ([place])   . Hoarse voice quality 01/19/2016  . Hypertension    states under control with med., has been on med. since 2011  . Immature cataract   . 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 Partial tear of right rotator cuff 12/2015  . Wears dentures    upper    Past Surgical History:  Procedure Laterality Date  . BREAST BIOPSY Left   . BUNIONECTOMY Bilateral 1994  . CHOLECYSTECTOMY  01/17/2012   Procedure: LAPAROSCOPIC CHOLECYSTECTOMY;  Surgeon: Harl Bowie, MD;  Location: [place];  Service: General;  Laterality: N/A;  . PUBOVAGINAL SLING  03/05/2003  . SHOULDER ARTHROSCOPY WITH DISTAL CLAVICLE RESECTION Right 01/26/2016   Procedure: SHOULDER ARTHROSCOPY WITH DISTAL CLAVICLE RESECTION;  Surgeon: Tania Ade, MD;  Location: [place];  Service: Orthopedics;  Laterality: Right;  . SHOULDER ARTHROSCOPY WITH SUBACROMIAL DECOMPRESSION Right 01/26/2016   Procedure: SHOULDER ARTHROSCOPY WITH SUBACROMIAL DECOMPRESSION DEBRIDEMENT;  Surgeon: Tania Ade, MD;  Location: [place];  Service: Orthopedics;  Laterality: Right;  . TONSILLECTOMY  1975  . TOTAL VAGINAL HYSTERECTOMY  03/05/2003  . TUBAL LIGATION  1981    Current Medications: No outpatient medications have been marked as taking for the 01/15/21 encounter (Appointment) with  Freada Bergeron, MD.   Current Facility-Administered Medications for the 01/15/21 encounter (Appointment) with Freada Bergeron, MD  Medication  . 0.9 %  sodium chloride infusion     Allergies:   Percocet [oxycodone-acetaminophen]   Social History   Socioeconomic History  . Marital status: Widowed    Spouse name: Not on file  . Number of children: Not on file  . Years of education: Not on file  . Highest education level: 10th grade  Occupational History  . Not on file  Tobacco Use  . Smoking status: Former Smoker    Packs/day: 1.00    Years: 37.00    Pack years: 37.00    Types: Cigarettes    Start date: 11/01/1981    Quit date: 01/09/2018    Years since quitting: 3.0  . Smokeless tobacco: Never Used  . Tobacco comment: 1 ppd max use.   Vaping Use  . Vaping Use: Never used  Substance and Sexual Activity  . Alcohol use: Yes    Alcohol/week: 0.0 standard drinks    Comment: rarely  . Drug use: No  . Sexual activity: Not Currently    Partners: Male    Birth control/protection: Surgical  Other Topics Concern  . Not on file  Social History Narrative   Previous [facility] patient, last seen 05/15/12      Works fulltime at [employer] at [employer]).  Completed some high school   Currently separated   Lives with her ssiter, Sonya Walton, [phone]  Social Determinants of Health   Financial Resource Strain: Not on file  Food Insecurity: Not on file  Transportation Needs: No Transportation Needs  . Lack of Transportation (Medical): No  . Lack of Transportation (Non-Medical): No  Physical Activity: Not on file  Stress: Not on file  Social Connections: Not on file     Family History: The patient's ***family history includes Asthma in her brother, sister, and another family member; Breast cancer in her sister; COPD in her sister; Cancer in her father, paternal aunt, and another family member; Diabetes in her brother and another family member;  Diabetes type II in her brother and sister; Heart disease in her mother; Heart failure in her mother; Hyperlipidemia in her mother; Hypertension in her brother, mother, sister, and another family member; Liver cancer in her father. There is no history of Colon polyps, Esophageal cancer, Rectal cancer, or Stomach cancer.  ROS:   Please see the history of present illness.    *** All other systems reviewed and are negative.  EKGs/Labs/Other Studies Reviewed:    The following studies were reviewed today: CT chest 12/09/20: FINDINGS: Cardiovascular: Normal heart size. No significant pericardial effusion/thickening. Three-vessel coronary atherosclerosis. Atherosclerotic nonaneurysmal thoracic aorta. Normal caliber pulmonary arteries.  Mediastinum/Nodes: Stable goiter asymmetrically involving the left thyroid lobe with dominant hypodense 1.7 cm posterior left thyroid nodule. Mild right tracheal deviation and narrowing by the thyroid. Unremarkable esophagus. No pathologically enlarged axillary, mediastinal or hilar lymph nodes, noting limited sensitivity for the detection of hilar adenopathy on this noncontrast study.  Lungs/Pleura: No pneumothorax. No pleural effusion. Mild centrilobular emphysema. Irregular subsolid peripheral apical right upper lobe 3.1 x 2.0 cm lung mass (series 4/image 79), increased from 2.6 x 1.5 cm (also increased on DynaCAD where it is poorly segmented from 11.9 mm in volume derived mean diameter to 14.3 mm). Additional previously visualized scattered small bilateral pulmonary nodules are stable. No new significant pulmonary nodules.  Upper abdomen: Diffusely mildly irregular liver surface compatible with cirrhosis.  Musculoskeletal: No aggressive appearing focal osseous lesions. Moderate thoracic spondylosis.  IMPRESSION: 1. Lung-RADS 4B, suspicious. Additional imaging evaluation or consultation with Pulmonology or Thoracic Surgery recommended. Irregular  subsolid peripheral apical right upper lobe 3.1 cm lung mass, increased in size since 09/05/2020 chest CT, suspicious for primary bronchogenic adenocarcinoma. Suggest PET-CT for further characterization. 2. Three-vessel coronary atherosclerosis. 3. Stable goiter asymmetrically involving the left thyroid lobe with mild right tracheal deviation and narrowing. Dominant 1.7 cm posterior left thyroid hypodense nodule. Recommend thyroid US (ref: J Am Coll Radiol. 2015 Feb;12(2): 143-50). 4. Cirrhosis. 5. Aortic Atherosclerosis (ICD10-I70.0) and Emphysema (ICD10-J43.9).  Myoview 2013: IMPRESSION:  1. No specific features identified to suggest pharmacologically  induced myocardial ischemia.  2. Left ventricular ejection fraction equals 69%.  3. Normal left ventricular systolic function.   EKG:  EKG is *** ordered today.  The ekg ordered today demonstrates ***  Recent Labs: 07/21/2020: BUN 9; Creatinine, Ser 0.74; Potassium 3.5; Sodium 139  Recent Lipid Panel    Component Value Date/Time   CHOL 156 07/21/2020 1602   TRIG 108 07/21/2020 1602   TRIG 192 10/05/2012 1817   HDL 46 07/21/2020 1602   CHOLHDL 3.4 07/21/2020 1602   CHOLHDL 3.6 04/12/2016 0953   VLDL 24 04/12/2016 0953   LDLCALC 90 07/21/2020 1602   LDLCALC 95 10/05/2012 1817     Risk Assessment/Calculations:   {Does this patient have ATRIAL FIBRILLATION?[phone]   Physical Exam:    VS:  There were no vitals taken  for this visit.    Wt Readings from Last 3 Encounters:  12/22/20 221 lb 12.8 oz (100.6 kg)  12/12/20 221 lb 3.2 oz (100.3 kg)  10/06/20 220 lb 6 oz (100 kg)     GEN: *** Well nourished, well developed in no acute distress HEENT: Normal NECK: No JVD; No carotid bruits LYMPHATICS: No lymphadenopathy CARDIAC: ***RRR, no murmurs, rubs, gallops RESPIRATORY:  Clear to auscultation without rales, wheezing or rhonchi  ABDOMEN: Soft, non-tender, non-distended MUSCULOSKELETAL:  No edema; No deformity   SKIN: Warm and dry NEUROLOGIC:  Alert and oriented x 3 PSYCHIATRIC:  Normal affect   ASSESSMENT:    No diagnosis found. PLAN:    In order of problems listed above:  #Chest Pain: #Multivessel coronary calcification:   {Are you ordering a CV Procedure (e.g. stress test, cath, DCCV, TEE, etc)?   Press F2        :[id number]}    Medication Adjustments/Labs and Tests Ordered: Current medicines are reviewed at length with the patient today.  Concerns regarding medicines are outlined above.  No orders of the defined types were placed in this encounter.  No orders of the defined types were placed in this encounter.   There are no Patient Instructions on file for this visit.   Signed, Freada Bergeron, MD  01/12/2021 9:38 PM    [facility]

## 2021-01-15 ENCOUNTER — Ambulatory Visit: Payer: Self-pay | Admitting: Cardiology

## 2021-01-15 ENCOUNTER — Telehealth: Payer: Self-pay | Admitting: Emergency Medicine

## 2021-01-15 NOTE — Telephone Encounter (Signed)
Called and spoke with patient who would like results of PET scan done on 01/05/2021. Patient phone number is [phone].   Dr. Lamonte Sakai please advise

## 2021-01-15 NOTE — Telephone Encounter (Signed)
Called and spoke to pt. Informed her of the results and recs per Dr. Lamonte Sakai. Pt verbalized understanding and denied any further questions or concerns at this time.

## 2021-01-15 NOTE — Telephone Encounter (Signed)
Please let the patient know that I reviewed the PET scan.  It shows that the pulmonary nodule is not hypermetabolic.  This would argue against lung cancer although still possible.  I still want to see her in office as planned so that we can review these results and plan next steps.

## 2021-01-19 ENCOUNTER — Other Ambulatory Visit: Payer: Self-pay

## 2021-01-19 ENCOUNTER — Ambulatory Visit (INDEPENDENT_AMBULATORY_CARE_PROVIDER_SITE_OTHER): Payer: PRIVATE HEALTH INSURANCE | Admitting: Family Medicine

## 2021-01-19 ENCOUNTER — Encounter: Payer: Self-pay | Admitting: Family Medicine

## 2021-01-19 DIAGNOSIS — R918 Other nonspecific abnormal finding of lung field: Secondary | ICD-10-CM | POA: Diagnosis not present

## 2021-01-19 DIAGNOSIS — R1032 Left lower quadrant pain: Secondary | ICD-10-CM | POA: Diagnosis not present

## 2021-01-19 NOTE — Assessment & Plan Note (Signed)
Pt reports 2 month hx of LLQ pain. No obvious associated sx. Alleviated with tylenol. She has a hx of cholecystectomy. Last colonoscopy was last year which showed >10 polyps which were removed. She also has diverticula in the large colon. Unclear cause of pain. Per chart review she had similar sx in 2014. Could be reflux or ovarian pain. Less likely to be UTI given lack of urinary sx. Also considered diverticulitis but no change in bowel habits. Used shared decision making to continue to monitor sx. If sx persist pt will let me know and I will order US abdomen.

## 2021-01-19 NOTE — Progress Notes (Signed)
     SUBJECTIVE:   CHIEF COMPLAINT / HPI:   Sonya Walton is a 60 y.o. female presents for CT imaging results    Right lung mass imaging results follow up  Pt is back for imaging results. PET scan 01/05/21-ground-glass nodule in the apical segment right upper lobe does not show metabolism above blood pool but has increased in size slightly from 09/05/2020.  Followed by Sonya Walton who read imaging results. He said the pulmonary nodule is not hypermetabolic which argue against lung cancer although still possible. She will follow up in the clinic.  [place] Office Visit from 10/06/2020 in [place]  PHQ-9 Total Score 4      LLQ pain  Started 2 months ago. She was sitting on the couch watching tv. Described as a pain which caught her breath. Intermittent, lasts 1 minute. Does not radiating. 7/10. Has taken tylenol which has helped. No exacbating factors. Denies dysuria, vomiting, diarrhea, constipation, rectal bleeding, fevers or weight loss. Can feel nauseous sometimes. Unsure whether it is related to food. She thought it could be diverticulitis.   Health Maintenance Due  Topic  . FOOT EXAM   . OPHTHALMOLOGY EXAM   . MAMMOGRAM   . COVID-19 Vaccine (3 - Booster for [employer] series)      PERTINENT  PMH / PSH: diabetes, mass of right lung, HTN, morbid obesity   OBJECTIVE:   BP 140/68   Pulse 89   Wt 219 lb 12.8 oz (99.7 kg)   SpO2 96%   BMI 38.94 kg/m    General: Alert, no acute distress Cardio: well perfused Pulm:  normal work of breathing Abdomen:. Abdomen soft, non distended, minor tenderness in RUQ, no guarding.  Neuro: Cranial nerves grossly intact   ASSESSMENT/PLAN:   Mass of right lung Recent PET scan showed pulmonary nodule which was not hypermetabolic making lung cancer less likely. Follow up with Sonya Walton tomorrow.  LLQ pain Pt reports 2 month hx of LLQ pain. No obvious associated sx. Alleviated with tylenol. She has a hx of  cholecystectomy. Last colonoscopy was last year which showed >10 polyps which were removed. She also has diverticula in the large colon. Unclear cause of pain. Per chart review she had similar sx in 2014. Could be reflux or ovarian pain. Less likely to be UTI given lack of urinary sx. Also considered diverticulitis but no change in bowel habits. Used shared decision making to continue to monitor sx. If sx persist pt will let me know and I will order US abdomen.     Lattie Haw, MD PGY-2 [place]

## 2021-01-19 NOTE — Assessment & Plan Note (Signed)
Recent PET scan showed pulmonary nodule which was not hypermetabolic making lung cancer less likely. Follow up with Dr Lamonte Sakai tomorrow.

## 2021-01-19 NOTE — Patient Instructions (Addendum)
Thank you for coming to see me today. It was a pleasure. Today we discussed the lung imaging results. Follow up tomorrow with Dr Lamonte Sakai. I will keep an eye out for what he says about the lung mass.  Please follow-up with me in as and when you need.  If you have any questions or concerns, please do not hesitate to call the office at [phone].  Best wishes,   Dr Posey Pronto

## 2021-01-20 ENCOUNTER — Telehealth: Payer: Self-pay | Admitting: Emergency Medicine

## 2021-01-20 ENCOUNTER — Encounter: Payer: Self-pay | Admitting: Emergency Medicine

## 2021-01-20 ENCOUNTER — Ambulatory Visit (INDEPENDENT_AMBULATORY_CARE_PROVIDER_SITE_OTHER): Payer: PRIVATE HEALTH INSURANCE | Admitting: Emergency Medicine

## 2021-01-20 VITALS — BP 124/80 | HR 70 | Temp 98.1°F | Ht 63.0 in | Wt 219.6 lb

## 2021-01-20 DIAGNOSIS — R911 Solitary pulmonary nodule: Secondary | ICD-10-CM

## 2021-01-20 LAB — CBC WITH DIFFERENTIAL/PLATELET
Basophils Absolute: 0 10*3/uL (ref 0.0–0.1)
Basophils Relative: 0.6 % (ref 0.0–3.0)
Eosinophils Absolute: 0.4 10*3/uL (ref 0.0–0.7)
Eosinophils Relative: 5 % (ref 0.0–5.0)
HCT: 43.6 % (ref 36.0–46.0)
Hemoglobin: 14.7 g/dL (ref 12.0–15.0)
Lymphocytes Relative: 40.4 % (ref 12.0–46.0)
Lymphs Abs: 3.3 10*3/uL (ref 0.7–4.0)
MCHC: 33.6 g/dL (ref 30.0–36.0)
MCV: 89.5 fl (ref 78.0–100.0)
Monocytes Absolute: 0.7 10*3/uL (ref 0.1–1.0)
Monocytes Relative: 8.6 % (ref 3.0–12.0)
Neutro Abs: 3.7 10*3/uL (ref 1.4–7.7)
Neutrophils Relative %: 45.4 % (ref 43.0–77.0)
Platelets: 210 10*3/uL (ref 150.0–400.0)
RBC: 4.87 Mil/uL (ref 3.87–5.11)
RDW: 14.5 % (ref 11.5–15.5)
WBC: 8 10*3/uL (ref 4.0–10.5)

## 2021-01-20 LAB — PROTIME-INR
INR: 1.2 ratio — ABNORMAL HIGH (ref 0.8–1.0)
Prothrombin Time: 13.7 s — ABNORMAL HIGH (ref 9.6–13.1)

## 2021-01-20 LAB — BASIC METABOLIC PANEL
BUN: 9 mg/dL (ref 6–23)
CO2: 30 mEq/L (ref 19–32)
Calcium: 9.4 mg/dL (ref 8.4–10.5)
Chloride: 102 mEq/L (ref 96–112)
Creatinine, Ser: 0.57 mg/dL (ref 0.40–1.20)
GFR: 98.35 mL/min (ref >60.00)
Glucose, Bld: 218 mg/dL — ABNORMAL HIGH (ref 70–99)
Potassium: 3.1 mEq/L — ABNORMAL LOW (ref 3.5–5.1)
Sodium: 138 mEq/L (ref 135–145)

## 2021-01-20 NOTE — Assessment & Plan Note (Signed)
Reassuring PET with out any hypermetabolism, no mediastinal adenopathy.  That being said it has grown on interval CT.  She would like to pursue a tissue diagnosis now.  Navigational bronchoscopy will be arranged, hopefully for 02/02/2021.  She will need a dedicated super D CT in order to perform and we will order this.  Lab work today.

## 2021-01-20 NOTE — Patient Instructions (Addendum)
We will work on arranging a bronchoscopy to biopsy her right upper lobe pulmonary nodule.  Hopefully we can arrange this for 02/02/2021.  You will be contacted by our office with final details.  This can be done as an outpatient.  You will need a designated driver. Lab work today We will perform a repeat CT scan of your chest before your procedure. Follow with Dr Lamonte Sakai in 1 month

## 2021-01-20 NOTE — Telephone Encounter (Signed)
I scheduled pt for ENB on 4/4 at 7:30 at [facility].  CT scheduled for 4/1 at 11:30 & covid test on 4/1 at 12:30.  [facility] will have courier to send disk to Endo stat or Nunzio Cobbs said she would run it over herself.  Gave appt info to pt.

## 2021-01-20 NOTE — Addendum Note (Signed)
Addended by: Gavin Potters R on: 01/20/2021 11:22 AM   Modules accepted: Orders

## 2021-01-20 NOTE — Progress Notes (Signed)
   Subjective:    Patient ID: [name], female    DOB: 1961/02/03, 60 y.o.   MRN: [id number]  HPI 60 year old former smoker (37 pack years) with a history of diabetes, allergic rhinitis, hypertension, osteoarthritis. She is referred today for evaluation of an abnormal CT scan of the chest. She participates in the lung cancer screening program and had her most recent screening scan on 12/09/2020.  This was a 56-month follow-up scan from 09/05/2020 which had identified an ill-defined right upper lobe peripheral 2.6 x 1.5 cm density opacity.  As below the lesion of interest is increased in size.  A PET scan is pending tomorrow 12/23/2020.   She has a good functional capacity. She does get a little winded when bringing her trash uphill. Otherwise she isn't limited. No cough or wheeze. She does have chronic hoarse voice.   CT chest 12/09/2020 reviewed by me, shows mild emphysematous change, enlargement of an irregular subsolid apical right upper lobe 3.1 x 2.0 cm mass lesion.  I do not see any mediastinal adenopathy.   Pulmonary function testing 12/18/2020 reviewed by me, shows moderately severe mixed obstruction and restriction by spirometry without a bronchodilator response, FEV1 1.64 L, lung volumes falling to the normal range.  Diffusion capacity is elevated.  ROV 01/20/21 --follow-up visit 60 year old former smoker with COPD based on pulmonary function testing.  She has diabetes, rhinitis, hypertension.  I saw her for a right upper lobe peripheral pulmonary nodule increasing in size on lung cancer screening CT.  Most recent scan 12/09/2020 showed it to be 3.1 x 2.0 cm without any mediastinal adenopathy. She is feeling well.  Her brother had lung CA.   PET scan performed on 01/05/2021 reviewed by me, shows that the groundglass nodule in question does not show any hypermetabolism.  There are no hypermetabolic mediastinal hilar or axillary lymph nodes.   Review of Systems As per HPI      Objective:    Physical Exam  Vitals:   01/20/21 1045  BP: 124/80  Pulse: 70  Temp: 98.1 F (36.7 C)  TempSrc: Temporal  SpO2: 96%  Weight: 219 lb 9.6 oz (99.6 kg)  Height: 5\' 3"  (1.6 m)   Gen: Pleasant, obese woman, in no distress,  normal affect  ENT: No lesions,  mouth clear,  oropharynx clear, no postnasal drip, hoarse voice  Neck: No JVD, no stridor  Lungs: No use of accessory muscles, no crackles or wheezing on normal respiration, no wheeze on forced expiration  Cardiovascular: RRR, heart sounds normal, no murmur or gallops, no peripheral edema  Musculoskeletal: No deformities, no cyanosis or clubbing  Neuro: alert, awake, non focal  Skin: Warm, no lesions or rash      Assessment & Plan:  Pulmonary nodule 1 cm or greater in diameter Reassuring PET with out any hypermetabolism, no mediastinal adenopathy.  That being said it has grown on interval CT.  She would like to pursue a tissue diagnosis now.  Navigational bronchoscopy will be arranged, hopefully for 02/02/2021.  She will need a dedicated super D CT in order to perform and we will order this.  Lab work today.  Baltazar Apo, MD, PhD 01/20/2021, 11:16 AM [facility] [phone] or if no answer before 7:00PM call [phone] For any issues after 7:00PM please call eLink [phone]

## 2021-01-20 NOTE — Addendum Note (Signed)
Addended by: Suzzanne Cloud E on: 01/20/2021 11:24 AM   Modules accepted: Orders

## 2021-01-25 NOTE — Progress Notes (Signed)
Cardiology Office Note:    Date:  01/27/2021   ID:  Sonya Walton, DOB 1961/03/05, MRN [id number]  PCP:  Sonya Haw, MD   [place]  Cardiologist:  Sonya Bergeron, MD  Advanced Practice Provider:  No care team member to display Electrophysiologist:  None    Referring MD: Sonya Resides, MD    History of Present Illness:    Sonya Walton is a 60 y.o. female with a hx of DMII, HTN, OA, and former tobacco use who was referred by Dr. Andria Walton for further evaluation of chest pain.  The patient states that she is having intermittent chest squeezing over the past couple of months. Episodes occur at rest and last a couple of seconds to a minute before resolving. Occurs about 2x/week. Symptoms not exertional. She does note, however, some dyspnea on exertion especially when walking up an incline. This has gotten a little worse over the past couple of months which she thinks may be related to being less active in general.  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   CT chest 12/09/20 with 3V atherosclerosis.  Mother with CAD (diagnosed 53s).  Past Medical History:  Diagnosis Date  . Allergy   . Arthritis   . Diabetes mellitus without complication ([place])   . Hoarse voice quality 01/19/2016  . Hypertension    states under control with med., has been on med. since 2011  . Immature cataract   . 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 Partial tear of right rotator cuff 12/2015  . Wears dentures    upper    Past Surgical History:  Procedure Laterality Date  . BREAST BIOPSY Left   . BUNIONECTOMY Bilateral 1994  . CHOLECYSTECTOMY  01/17/2012   Procedure: LAPAROSCOPIC CHOLECYSTECTOMY;  Surgeon: Harl Bowie, MD;  Location: [facility];  Service: General;  Laterality: N/A;  . PUBOVAGINAL SLING  03/05/2003  . SHOULDER ARTHROSCOPY WITH DISTAL CLAVICLE RESECTION Right  01/26/2016   Procedure: SHOULDER ARTHROSCOPY WITH DISTAL CLAVICLE RESECTION;  Surgeon: Tania Ade, MD;  Location: [facility];  Service: Orthopedics;  Laterality: Right;  . SHOULDER ARTHROSCOPY WITH SUBACROMIAL DECOMPRESSION Right 01/26/2016   Procedure: SHOULDER ARTHROSCOPY WITH SUBACROMIAL DECOMPRESSION DEBRIDEMENT;  Surgeon: Tania Ade, MD;  Location: [facility];  Service: Orthopedics;  Laterality: Right;  . TONSILLECTOMY  1975  . TOTAL VAGINAL HYSTERECTOMY  03/05/2003  . TUBAL LIGATION  1981    Current Medications: Current Meds  Medication Sig  . acetaminophen (TYLENOL) 500 MG tablet Take 1,000 mg by mouth every 6 (six) hours as needed for mild pain or moderate pain.  Marland Kitchen atenolol-chlorthalidone (TENORETIC) 50-25 MG tablet TAKE 1 TABLET BY MOUTH EVERY DAY  . blood glucose meter kit and supplies KIT Inject 1 each into the skin as directed. Use meter once daily to measure blood glucose.  . cetirizine (ZYRTEC) 10 MG tablet Chew 10 mg by mouth daily.  . Cholecalciferol (VITAMIN D3) 2000 UNITS TABS Take 2,000 Units by mouth every morning.  . Dulaglutide 3 MG/0.5ML SOPN Inject 3 mg into the skin every Thursday.  Marland Kitchen glucose blood ([facility] ADVANCED GLUCOSE TEST) test strip Please specify directions, refills and quantity\ Once daily, [id number], 3 refills, 100 quantity Dr Sonya Haw MD  . Insulin Glargine (BASAGLAR KWIKPEN) 100 UNIT/ML SOPN Inject 0.22 mLs (22 Units total) into the skin  daily.  . Insulin Pen Needle (PEN NEEDLES) 32G X 6 MM MISC 1 each by Does not apply route daily.  . metoprolol tartrate (LOPRESSOR) 100 MG tablet Take 1 tablet (100 mg total) two hours prior to CT scan  . rosuvastatin (CRESTOR) 10 MG tablet Take 1 tablet (10 mg total) by mouth daily.   Current Facility-Administered Medications for the 01/27/21 encounter (Office Visit) with Sonya Bergeron, MD  Medication  . 0.9 %  sodium chloride infusion     Allergies:   Percocet  [oxycodone-acetaminophen]   Social History   Socioeconomic History  . Marital status: Widowed    Spouse name: Not on file  . Number of children: Not on file  . Years of education: Not on file  . Highest education level: 10th grade  Occupational History  . Not on file  Tobacco Use  . Smoking status: Former Smoker    Packs/day: 1.00    Years: 37.00    Pack years: 37.00    Types: Cigarettes    Start date: 11/01/1981    Quit date: 01/09/2018    Years since quitting: 3.0  . Smokeless tobacco: Never Used  . Tobacco comment: 1 ppd max use.   Vaping Use  . Vaping Use: Never used  Substance and Sexual Activity  . Alcohol use: Yes    Alcohol/week: 0.0 standard drinks    Comment: rarely  . Drug use: No  . Sexual activity: Not Currently    Partners: Male    Birth control/protection: Surgical  Other Topics Concern  . Not on file  Social History Narrative   Previous [facility] patient, last seen 05/15/12      Works fulltime at [employer] at [employer]).  Completed some high school   Currently separated   Lives with her ssiter, Sonya Walton, [place]            Social Determinants of Health   Financial Resource Strain: Not on file  Food Insecurity: Not on file  Transportation Needs: No Transportation Needs  . Lack of Transportation (Medical): No  . Lack of Transportation (Non-Medical): No  Physical Activity: Not on file  Stress: Not on file  Social Connections: Not on file     Family History: The patient's family history includes Asthma in her brother, sister, and another family member; Breast cancer in her sister; COPD in her sister; Cancer in her father, paternal aunt, and another family member; Diabetes in her brother and another family member; Diabetes type II in her brother and sister; Heart disease in her mother; Heart failure in her mother; Hyperlipidemia in her mother; Hypertension in her brother, mother, sister, and another family member; Liver cancer in her  father. There is no history of Colon polyps, Esophageal cancer, Rectal cancer, or Stomach cancer.  ROS:   Please see the history of present illness.    Review of Systems  Constitutional: Negative for chills and fever.  HENT: Negative for sore throat.   Eyes: Negative for blurred vision and redness.  Respiratory: Positive for shortness of breath.   Cardiovascular: Positive for chest pain. Negative for palpitations, orthopnea, claudication, leg swelling and PND.  Gastrointestinal: Negative for melena, nausea and vomiting.  Genitourinary: Negative for dysuria and flank pain.  Musculoskeletal: Negative for myalgias.  Neurological: Negative for dizziness and loss of consciousness.  Psychiatric/Behavioral: Negative for substance abuse.    EKGs/Labs/Other Studies Reviewed:    The following studies were reviewed today: CT Chest 12/09/20: IMPRESSION: 1. Lung-RADS 4B,  suspicious. Additional imaging evaluation or consultation with Pulmonology or Thoracic Surgery recommended. Irregular subsolid peripheral apical right upper lobe 3.1 cm lung mass, increased in size since 09/05/2020 chest CT, suspicious for primary bronchogenic adenocarcinoma. Suggest PET-CT for further characterization. 2. Three-vessel coronary atherosclerosis. 3. Stable goiter asymmetrically involving the left thyroid lobe with mild right tracheal deviation and narrowing. Dominant 1.7 cm posterior left thyroid hypodense nodule. Recommend thyroid US (ref: J Am Coll Radiol. 2015 Feb;12(2): 143-50). 4. Cirrhosis. 5. Aortic Atherosclerosis (ICD10-I70.0) and Emphysema (ICD10-J43.9).  Myoview 2013: IMPRESSION:  1. No specific features identified to suggest pharmacologically  induced myocardial ischemia.  2. Left ventricular ejection fraction equals 69%.  3. Normal left ventricular systolic function.  EKG:  EKG is  ordered today.  The ekg ordered today demonstrates NSR with anterior q waves V1-V2  Recent  Labs: 01/20/2021: BUN 9; Creatinine, Ser 0.57; Hemoglobin 14.7; Platelets 210.0; Potassium 3.1; Sodium 138  Recent Lipid Panel    Component Value Date/Time   CHOL 156 07/21/2020 1602   TRIG 108 07/21/2020 1602   TRIG 192 10/05/2012 1817   HDL 46 07/21/2020 1602   CHOLHDL 3.4 07/21/2020 1602   CHOLHDL 3.6 04/12/2016 0953   VLDL 24 04/12/2016 0953   LDLCALC 90 07/21/2020 1602   LDLCALC 95 10/05/2012 1817     Risk Assessment/Calculations:       Physical Exam:    VS:  BP 124/80   Pulse 78   Ht _0  (1.6 m)   Wt 220 lb 12.8 oz (100.2 kg)   SpO2 93%   BMI 39.11 kg/m     Wt Readings from Last 3 Encounters:  01/27/21 220 lb 12.8 oz (100.2 kg)  01/20/21 219 lb 9.6 oz (99.6 kg)  01/19/21 219 lb 12.8 oz (99.7 kg)     GEN:  Well nourished, well developed in no acute distress HEENT: Normal NECK: No JVD; No carotid bruits CARDIAC: RRR, no murmurs, rubs, gallops RESPIRATORY:  Clear to auscultation without rales, wheezing or rhonchi  ABDOMEN: Soft, non-tender, non-distended MUSCULOSKELETAL:  No edema; No deformity  SKIN: Warm and dry NEUROLOGIC:  Alert and oriented x 3 PSYCHIATRIC:  Normal affect   ASSESSMENT:    1. Shortness of breath   2. Precordial pain   3. Coronary artery disease involving native coronary artery of native heart without angina pectoris   4. Type 2 diabetes mellitus without complication, with long-term current use of insulin ([place])   5. Mixed hyperlipidemia    PLAN:    In order of problems listed above:  #Chest Pain: #DOE #Multivessel Vessel Coronary Calcification: #CAD: Patient with atypical chest pain that does not sound cardiac in nature, however, has progressive dyspnea on exertion is more concerning as it is worsening over the past couple of months. CT chest with 3 vessel coronary calcification. ECG with anterior q-waves (V1-V2). Given her risk factors of HTN, DMII, family history and known CAD, will check coronary CTA and TTE. -Check coronary  CTA and TTE -Start crestor 52m daily  #HTN: Controlled. -Continue atenolol-chlorthalidone 50-282m #DMII: -Continue insulin  #HLD: -Start crestor 1035maily -Repeat lipids in 6 weeks -Goal LDL<70 given coronary artery disease on CT chest    Medication Adjustments/Labs and Tests Ordered: Current medicines are reviewed at length with the patient today.  Concerns regarding medicines are outlined above.  Orders Placed This Encounter  Procedures  . CT CORONARY MORPH W/CTA COR W/SCORE W/CA W/CM &/OR WO/CM  . CT CORONARY FRACTIONAL FLOW RESERVE DATA PREP  .  CT CORONARY FRACTIONAL FLOW RESERVE FLUID ANALYSIS  . ALT  . Lipid panel  . Basic metabolic panel  . EKG 12-Lead  . ECHOCARDIOGRAM COMPLETE   Meds ordered this encounter  Medications  . rosuvastatin (CRESTOR) 10 MG tablet    Sig: Take 1 tablet (10 mg total) by mouth daily.    Dispense:  90 tablet    Refill:  3  . metoprolol tartrate (LOPRESSOR) 100 MG tablet    Sig: Take 1 tablet (100 mg total) two hours prior to CT scan    Dispense:  1 tablet    Refill:  0    Patient Instructions  Medication Instructions:  Your physician has recommended you make the following change in your medication:  1) START taking Crestor (rosuvastatin) 10 mg daily   *If you need a refill on your cardiac medications before your next appointment, please call your pharmacy*   Lab Work: Fasting lipids and ALT in 6 weeks  If you have labs (blood work) drawn today and your tests are completely normal, you will receive your results only by: Marland Kitchen MyChart Message (if you have MyChart) OR . A paper copy in the mail If you have any lab test that is abnormal or we need to change your treatment, we will call you to review the results.   Testing/Procedures: Your physician has requested that you have an echocardiogram. Echocardiography is a painless test that uses sound waves to create images of your heart. It provides your doctor with information about  the size and shape of your heart and how well your heart's chambers and valves are working. This procedure takes approximately one hour. There are no restrictions for this procedure.  Your physician has requested that you have a coronary CTA scan. Please see below for further instructions.   Follow-Up: At [facility], you and your health needs are our priority.  As part of our continuing mission to provide you with exceptional heart care, we have created designated Provider Care Teams.  These Care Teams include your primary Cardiologist (physician) and Advanced Practice Providers (APPs -  Physician Assistants and Nurse Practitioners) who all work together to provide you with the care you need, when you need it.  Your next appointment:   6 month(s)  The format for your next appointment:   In Person  Provider:   You will see one of the following Advanced Practice Providers on your designated Care Team:    Richardson Dopp, PA-C  Robbie Lis, [place]  Other Instructions Your cardiac CT will be scheduled at:   [facility] [address] [place], [place] [address] [phone]  Please arrive at the [facility] main entrance (entrance A) of [facility] 30 minutes prior to test start time. Proceed to the [facility] Radiology Department (first floor) to check-in and test prep.  Please follow these instructions carefully (unless otherwise directed):  On the Night Before the Test: . Be sure to Drink plenty of water. . Do not consume any caffeinated/decaffeinated beverages or chocolate 12 hours prior to your test. . Do not take any antihistamines 12 hours prior to your test.  On the Day of the Test: . Drink plenty of water until 1 hour prior to the test. . Do not eat any food 4 hours prior to the test. . You may take your regular medications prior to the test.  . Take metoprolol (Lopressor) 100 mg two hours prior to test. . FEMALES- please wear underwire-free bra  if  available    After the Test: . Drink plenty of water. . After receiving IV contrast, you may experience a mild flushed feeling. This is normal. . On occasion, you may experience a mild rash up to 24 hours after the test. This is not dangerous. If this occurs, you can take Benadryl 25 mg and increase your fluid intake. . If you experience trouble breathing, this can be serious. If it is severe call 911 IMMEDIATELY. If it is mild, please call our office. . If you take any of these medications: Glipizide/Metformin, Avandament, Glucavance, please do not take 48 hours after completing test unless otherwise instructed.   Once we have confirmed authorization from your insurance company, we will call you to set up a date and time for your test. Based on how quickly your insurance processes prior authorizations requests, please allow up to 4 weeks to be contacted for scheduling your Cardiac CT appointment. Be advised that routine Cardiac CT appointments could be scheduled as many as 8 weeks after your provider has ordered it.  For non-scheduling related questions, please contact the cardiac imaging nurse navigator should you have any questions/concerns: Marchia Bond, Cardiac Imaging Nurse Navigator Gordy Clement, Cardiac Imaging Nurse Navigator [facility] Direct Office Dial: [phone]   For scheduling needs, including cancellations and rescheduling, please call [place], [phone].      Signed, Sonya Bergeron, MD  01/27/2021 11:28 AM    [place]

## 2021-01-27 ENCOUNTER — Encounter: Payer: Self-pay | Admitting: Cardiology

## 2021-01-27 ENCOUNTER — Ambulatory Visit (INDEPENDENT_AMBULATORY_CARE_PROVIDER_SITE_OTHER): Payer: PRIVATE HEALTH INSURANCE | Admitting: Cardiology

## 2021-01-27 ENCOUNTER — Other Ambulatory Visit: Payer: Self-pay

## 2021-01-27 VITALS — BP 124/80 | HR 78 | Ht 63.0 in | Wt 220.8 lb

## 2021-01-27 DIAGNOSIS — E782 Mixed hyperlipidemia: Secondary | ICD-10-CM

## 2021-01-27 DIAGNOSIS — E119 Type 2 diabetes mellitus without complications: Secondary | ICD-10-CM

## 2021-01-27 DIAGNOSIS — R072 Precordial pain: Secondary | ICD-10-CM

## 2021-01-27 DIAGNOSIS — R0602 Shortness of breath: Secondary | ICD-10-CM

## 2021-01-27 DIAGNOSIS — Z794 Long term (current) use of insulin: Secondary | ICD-10-CM

## 2021-01-27 DIAGNOSIS — I2510 Atherosclerotic heart disease of native coronary artery without angina pectoris: Secondary | ICD-10-CM

## 2021-01-27 MED ORDER — METOPROLOL TARTRATE 100 MG PO TABS: ORAL_TABLET | ORAL | 0 refills | Status: DC

## 2021-01-27 MED ORDER — ROSUVASTATIN CALCIUM 10 MG PO TABS: 10.0000 mg | ORAL_TABLET | Freq: Every day | ORAL | 3 refills | Status: DC

## 2021-01-27 NOTE — Patient Instructions (Addendum)
Medication Instructions:  Your physician has recommended you make the following change in your medication:  1) START taking Crestor (rosuvastatin) 10 mg daily   *If you need a refill on your cardiac medications before your next appointment, please call your pharmacy*   Lab Work: Fasting lipids and ALT in 6 weeks  If you have labs (blood work) drawn today and your tests are completely normal, you will receive your results only by: Marland Kitchen MyChart Message (if you have MyChart) OR . A paper copy in the mail If you have any lab test that is abnormal or we need to change your treatment, we will call you to review the results.   Testing/Procedures: Your physician has requested that you have an echocardiogram. Echocardiography is a painless test that uses sound waves to create images of your heart. It provides your doctor with information about the size and shape of your heart and how well your heart's chambers and valves are working. This procedure takes approximately one hour. There are no restrictions for this procedure.  Your physician has requested that you have a coronary CTA scan. Please see below for further instructions.   Follow-Up: At [facility], you and your health needs are our priority.  As part of our continuing mission to provide you with exceptional heart care, we have created designated Provider Care Teams.  These Care Teams include your primary Cardiologist (physician) and Advanced Practice Providers (APPs -  Physician Assistants and Nurse Practitioners) who all work together to provide you with the care you need, when you need it.  Your next appointment:   6 month(s)  The format for your next appointment:   In Person  Provider:   You will see one of the following Advanced Practice Providers on your designated Care Team:    Richardson Dopp, PA-C  Robbie Lis, [place]  Other Instructions Your cardiac CT will be scheduled at:   [facility] [address] [place], [place] [address] [phone]  Please arrive at the [facility] main entrance (entrance A) of [facility] 30 minutes prior to test start time. Proceed to the [facility] Radiology Department (first floor) to check-in and test prep.  Please follow these instructions carefully (unless otherwise directed):  On the Night Before the Test: . Be sure to Drink plenty of water. . Do not consume any caffeinated/decaffeinated beverages or chocolate 12 hours prior to your test. . Do not take any antihistamines 12 hours prior to your test.  On the Day of the Test: . Drink plenty of water until 1 hour prior to the test. . Do not eat any food 4 hours prior to the test. . You may take your regular medications prior to the test.  . Take metoprolol (Lopressor) 100 mg two hours prior to test. . FEMALES- please wear underwire-free bra if available    After the Test: . Drink plenty of water. . After receiving IV contrast, you may experience a mild flushed feeling. This is normal. . On occasion, you may experience a mild rash up to 24 hours after the test. This is not dangerous. If this occurs, you can take Benadryl 25 mg and increase your fluid intake. . If you experience trouble breathing, this can be serious. If it is severe call 911 IMMEDIATELY. If it is mild, please call our office. . If you take any of these medications: Glipizide/Metformin, Avandament, Glucavance, please do not take 48 hours after completing test unless otherwise instructed.  Once we have confirmed authorization from your insurance company, we will call you to set up a date and time for your test. Based on how quickly your insurance processes prior authorizations requests, please allow up to 4 weeks to be contacted for scheduling your Cardiac CT appointment. Be advised that routine Cardiac CT appointments could be scheduled as many as 8 weeks after your provider has ordered it.  For non-scheduling related  questions, please contact the cardiac imaging nurse navigator should you have any questions/concerns: Marchia Bond, Cardiac Imaging Nurse Navigator Gordy Clement, Cardiac Imaging Nurse Navigator [facility] Direct Office Dial: [phone]   For scheduling needs, including cancellations and rescheduling, please call [place], [phone].

## 2021-01-28 ENCOUNTER — Telehealth: Payer: Self-pay | Admitting: Cardiology

## 2021-01-28 NOTE — Telephone Encounter (Signed)
Pt inquiring about having to start Metoprolol. Educated pt that this is a one time dose to take prior to CT testing to slow the HR down for the pictures. Patient verbalized understanding and agreeable to plan.

## 2021-01-28 NOTE — Telephone Encounter (Signed)
A patient has questions about medication that was prescribed to him by Dr. Johney Frame. Wants answers before he picks up medication

## 2021-01-29 ENCOUNTER — Telehealth: Payer: Self-pay | Admitting: Emergency Medicine

## 2021-01-29 ENCOUNTER — Other Ambulatory Visit: Payer: PRIVATE HEALTH INSURANCE

## 2021-01-29 NOTE — Telephone Encounter (Signed)
Yes thanks for letting me know. It's ok to be on crestor.

## 2021-01-29 NOTE — Telephone Encounter (Signed)
Pt wanted to make RB aware that he was placed on crestor today.  He is scheduled for a procedure on Monday with RB.  Pt wanted to make sure that RB is aware that he started on crestor.  FYI.

## 2021-01-30 ENCOUNTER — Encounter (HOSPITAL_COMMUNITY): Payer: Self-pay | Admitting: Emergency Medicine

## 2021-01-30 ENCOUNTER — Other Ambulatory Visit (HOSPITAL_COMMUNITY)
Admission: RE | Admit: 2021-01-30 | Discharge: 2021-01-30 | Disposition: A | Discharge status: Home / Self Care | Payer: PRIVATE HEALTH INSURANCE | Source: Ambulatory Visit | Attending: Emergency Medicine | Admitting: Emergency Medicine

## 2021-01-30 ENCOUNTER — Ambulatory Visit (INDEPENDENT_AMBULATORY_CARE_PROVIDER_SITE_OTHER)
Admission: RE | Admit: 2021-01-30 | Discharge: 2021-01-30 | Disposition: A | Discharge status: Home / Self Care | Payer: PRIVATE HEALTH INSURANCE | Source: Ambulatory Visit | Attending: Emergency Medicine | Admitting: Emergency Medicine

## 2021-01-30 ENCOUNTER — Other Ambulatory Visit: Payer: Self-pay

## 2021-01-30 ENCOUNTER — Telehealth: Payer: Self-pay | Admitting: Emergency Medicine

## 2021-01-30 DIAGNOSIS — R911 Solitary pulmonary nodule: Secondary | ICD-10-CM

## 2021-01-30 DIAGNOSIS — Z20822 Contact with and (suspected) exposure to covid-19: Secondary | ICD-10-CM | POA: Diagnosis not present

## 2021-01-30 DIAGNOSIS — Z01812 Encounter for preprocedural laboratory examination: Secondary | ICD-10-CM | POA: Insufficient documentation

## 2021-01-30 NOTE — Telephone Encounter (Signed)
I tried calling the pt on home number which is also listed as cell- there was no answer after many rings and then went to fast busy tone. I tried calling her work number and was advised that no one by her name worked there. Will try home later.

## 2021-01-30 NOTE — Telephone Encounter (Signed)
Looks like its cancelled already

## 2021-01-30 NOTE — Progress Notes (Signed)
Anesthesia Chart Review:  Pt is a same day work up   Case: [id number] Date/Time: 02/02/21 0730   Procedure: VIDEO BRONCHOSCOPY WITH ENDOBRONCHIAL NAVIGATION (N/A )   Anesthesia type: General   Pre-op diagnosis: PULMONARY NODULE   Location: [facility]   Surgeons: Collene Gobble, MD      DISCUSSION: Pt is 60 years old with hx HTN, DM. Former smoker (quit 2019)   PROVIDERS: - PCP is Lattie Haw, MD - Saw cardiologist Gwyndolyn Kaufman, MD for eval chest pain on 01/27/21. Chest pain atypical, but CT chest shows 3 vessel disease and pt reports DOE worsening in over a couple of months. Echo, CTA ordered but have not been done yet   LABS:  - BMP 01/20/21 showed: K 3.1, glucose 218. Otherwise normal. - CBC w/diff 01/20/21 was normal.  - HbA1c 12/12/20 was 8.0   IMAGES: CT super D chest 01/30/21: results pending   PET scan 01/05/21:  1. Ground-glass nodule in the apical segment right upper lobe does not show metabolism above blood pool but has increased in size slightly from 09/05/2020, per CT chest 12/09/2020, and remains worrisome for low-grade adenocarcinoma. 2. Cirrhosis. 3. Asymmetrically enlarged heterogeneous left lobe of the thyroid. Patient underwent ultrasound thyroid 10/03/2020. No immediate follow-up recommended unless clinically warranted. (Ref: J Am Coll Radiol. 2015 Feb;12(2): 143-50). 4. Aortic atherosclerosis. Coronary artery calcification.   CT chest 12/09/20:  1. Lung-RADS 4B, suspicious. Additional imaging evaluation or consultation with Pulmonology or Thoracic Surgery recommended. Irregular subsolid peripheral apical right upper lobe 3.1 cm lung mass, increased in size since 09/05/2020 chest CT, suspicious for primary bronchogenic adenocarcinoma. Suggest PET-CT for further characterization. 2. Three-vessel coronary atherosclerosis. 3. Stable goiter asymmetrically involving the left thyroid lobe with mild right tracheal deviation and narrowing. Dominant  1.7 cm posterior left thyroid hypodense nodule. Recommend thyroid US 4. Cirrhosis. 5. Aortic Atherosclerosis  and Emphysema.   EKG 01/27/21: NSR. Anterior Q waves V1-V2   CV: Echo scheduled 02/26/21   Past Medical History:  Diagnosis Date  . Allergy   . Arthritis   . Diabetes mellitus without complication ([place])   . Hoarse voice quality 01/19/2016  . Hypertension    states under control with med., has been on med. since 2011  . Immature cataract   . 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 Partial tear of right rotator cuff 12/2015  . Wears dentures    upper    Past Surgical History:  Procedure Laterality Date  . BREAST BIOPSY Left   . BUNIONECTOMY Bilateral 1994  . CHOLECYSTECTOMY  01/17/2012   Procedure: LAPAROSCOPIC CHOLECYSTECTOMY;  Surgeon: Harl Bowie, MD;  Location: [facility];  Service: General;  Laterality: N/A;  . PUBOVAGINAL SLING  03/05/2003  . SHOULDER ARTHROSCOPY WITH DISTAL CLAVICLE RESECTION Right 01/26/2016   Procedure: SHOULDER ARTHROSCOPY WITH DISTAL CLAVICLE RESECTION;  Surgeon: Tania Ade, MD;  Location: [facility];  Service: Orthopedics;  Laterality: Right;  . SHOULDER ARTHROSCOPY WITH SUBACROMIAL DECOMPRESSION Right 01/26/2016   Procedure: SHOULDER ARTHROSCOPY WITH SUBACROMIAL DECOMPRESSION DEBRIDEMENT;  Surgeon: Tania Ade, MD;  Location: [facility];  Service: Orthopedics;  Laterality: Right;  . TONSILLECTOMY  1975  . TOTAL VAGINAL HYSTERECTOMY  03/05/2003  . TUBAL LIGATION  1981    MEDICATIONS: . 0.9 %  sodium chloride infusion   . acetaminophen (TYLENOL) 500 MG tablet  . atenolol-chlorthalidone (TENORETIC) 50-25 MG tablet  . cetirizine (ZYRTEC) 10 MG tablet  . Cholecalciferol (  VITAMIN D3) 2000 UNITS TABS  . Dulaglutide 3 MG/0.5ML SOPN  . Insulin Glargine (BASAGLAR KWIKPEN) 100 UNIT/ML SOPN  . nitroGLYCERIN (NITROSTAT) 0.4 MG SL tablet  . blood glucose meter kit and supplies KIT  . glucose blood ([employer]  ADVANCED GLUCOSE TEST) test strip  . Insulin Pen Needle (PEN NEEDLES) 32G X 6 MM MISC  . metoprolol tartrate (LOPRESSOR) 100 MG tablet  . rosuvastatin (CRESTOR) 10 MG tablet    Reviewed case with Dr. Roanna Banning. Pt will need to have her cardiac workup prior to anesthesia. I notified Lauren in Dr. Agustina Caroli office.   Willeen Cass, PhD, FNP-BC [facility]/Anesthesiology Phone: [phone] 01/30/2021 12:59 PM

## 2021-01-30 NOTE — Telephone Encounter (Signed)
I was notified by the office that on her preop evaluation it was determined that the patient needs further cardiac evaluation before she undergoes general anesthesia to facilitate her navigational bronchoscopy.  She apparently needs an echocardiogram and CT for calcium scoring.  I think the best plan is to postpone her ENB until after we can get the studies done.  Please call her and let her know that we will cancel for Monday 4/4 and then I will get her rescheduled as soon as she has had the appropriate cardiac testing.  Thank you very much

## 2021-01-30 NOTE — Telephone Encounter (Addendum)
Called and spoke to pt. Informed her of the recs per Dr. Lamonte Sakai. Pt verbalized understanding. Will keep encounter open to follow up on after cardiac tests have been completed and when Dr. Lamonte Sakai wants to reschedule ENB.    Will forward to PCCs to cancel the ENB.

## 2021-01-31 LAB — SARS CORONAVIRUS 2 (TAT 6-24 HRS): SARS Coronavirus 2: NEGATIVE

## 2021-02-02 ENCOUNTER — Encounter (HOSPITAL_COMMUNITY): Admission: RE | Payer: Self-pay | Source: Home / Self Care

## 2021-02-02 ENCOUNTER — Ambulatory Visit (HOSPITAL_COMMUNITY)
Admission: RE | Admit: 2021-02-02 | Payer: PRIVATE HEALTH INSURANCE | Source: Home / Self Care | Admitting: Emergency Medicine

## 2021-02-02 SURGERY — VIDEO BRONCHOSCOPY WITH ENDOBRONCHIAL NAVIGATION
Anesthesia: General

## 2021-02-04 ENCOUNTER — Telehealth (HOSPITAL_COMMUNITY): Payer: Self-pay | Admitting: *Deleted

## 2021-02-04 NOTE — Telephone Encounter (Signed)
Reaching out to patient to offer assistance regarding upcoming cardiac imaging study; pt verbalizes understanding of appt date/time, parking situation and where to check in, pre-test NPO status and medications ordered, and verified current allergies; name and call back number provided for further questions should they arise  Gordy Clement RN Navigator Cardiac [place] [facility] [phone] office [phone] cell  Pt to take 100mg  metoprolol tartrate 2 hours prior to scan.

## 2021-02-05 ENCOUNTER — Other Ambulatory Visit: Payer: Self-pay

## 2021-02-05 ENCOUNTER — Ambulatory Visit (HOSPITAL_COMMUNITY)
Admission: RE | Admit: 2021-02-05 | Discharge: 2021-02-05 | Disposition: A | Discharge status: Home / Self Care | Payer: PRIVATE HEALTH INSURANCE | Source: Ambulatory Visit | Attending: Cardiology | Admitting: Cardiology

## 2021-02-05 DIAGNOSIS — R072 Precordial pain: Secondary | ICD-10-CM

## 2021-02-05 MED ORDER — DILTIAZEM HCL 25 MG/5ML IV SOLN
INTRAVENOUS | Status: AC
  Administered 2021-02-05: 5 mg via INTRAVENOUS
  Filled 2021-02-05: qty 5

## 2021-02-05 MED ORDER — METOPROLOL TARTRATE 5 MG/5ML IV SOLN
INTRAVENOUS | Status: AC
  Administered 2021-02-05: 5 mg via INTRAVENOUS
  Filled 2021-02-05: qty 20

## 2021-02-05 MED ORDER — METOPROLOL TARTRATE 5 MG/5ML IV SOLN
5.0000 mg | INTRAVENOUS | Status: DC | PRN
  Administered 2021-02-05 (×3): 5 mg via INTRAVENOUS

## 2021-02-05 MED ORDER — IOHEXOL 350 MG/ML SOLN
80.0000 mL | Freq: Once | INTRAVENOUS | Status: AC | PRN
  Administered 2021-02-05: 80 mL via INTRAVENOUS

## 2021-02-05 MED ORDER — NITROGLYCERIN 0.4 MG SL SUBL
SUBLINGUAL_TABLET | SUBLINGUAL | Status: AC
  Filled 2021-02-05: qty 2

## 2021-02-05 MED ORDER — NITROGLYCERIN 0.4 MG SL SUBL
0.8000 mg | SUBLINGUAL_TABLET | Freq: Once | SUBLINGUAL | Status: AC
  Administered 2021-02-05: 0.8 mg via SUBLINGUAL

## 2021-02-05 MED ORDER — DILTIAZEM HCL 25 MG/5ML IV SOLN
5.0000 mg | INTRAVENOUS | Status: DC | PRN
  Administered 2021-02-05 (×3): 5 mg via INTRAVENOUS
  Filled 2021-02-05: qty 5

## 2021-02-05 NOTE — Telephone Encounter (Signed)
See phone note from 01/30/2021. Will close encounter.

## 2021-02-23 ENCOUNTER — Telehealth: Payer: Self-pay | Admitting: Cardiology

## 2021-02-23 NOTE — Telephone Encounter (Signed)
Spoke with the pt and educated her on the difference of what a Coronary CT looks at vs what an echo looks at.  Spent over 7 mins on the phone with her explaining the 2 different test. Advised the pt to keep her echo as planned.  Pt verbalized understanding and agrees with this plan.  Pt will keep her echo as scheduled. Pt appreciative for all the assistance provided.

## 2021-02-23 NOTE — Telephone Encounter (Signed)
Sonya Walton is calling wanting to know if she is still needing her Echo due to having a CT  done at the hospital that came back fine. Please advise.

## 2021-02-26 ENCOUNTER — Other Ambulatory Visit: Payer: PRIVATE HEALTH INSURANCE

## 2021-02-26 ENCOUNTER — Ambulatory Visit (HOSPITAL_COMMUNITY): Payer: PRIVATE HEALTH INSURANCE | Attending: Cardiology

## 2021-02-26 ENCOUNTER — Other Ambulatory Visit: Payer: Self-pay

## 2021-02-26 DIAGNOSIS — R0602 Shortness of breath: Secondary | ICD-10-CM

## 2021-02-26 DIAGNOSIS — R072 Precordial pain: Secondary | ICD-10-CM | POA: Diagnosis present

## 2021-02-26 LAB — BASIC METABOLIC PANEL
BUN/Creatinine Ratio: 16 (ref 12–28)
BUN: 10 mg/dL (ref 8–27)
CO2: 27 mmol/L (ref 20–29)
Calcium: 9 mg/dL (ref 8.7–10.3)
Chloride: 102 mmol/L (ref 96–106)
Creatinine, Ser: 0.61 mg/dL (ref 0.57–1.00)
Glucose: 162 mg/dL — ABNORMAL HIGH (ref 65–99)
Potassium: 3.2 mmol/L — ABNORMAL LOW (ref 3.5–5.2)
Sodium: 141 mmol/L (ref 134–144)
eGFR: 102 mL/min/{1.73_m2} (ref >59)

## 2021-02-26 LAB — LIPID PANEL
Chol/HDL Ratio: 2.6 ratio (ref 0.0–4.4)
Cholesterol, Total: 102 mg/dL (ref 100–199)
HDL: 40 mg/dL (ref >39)
LDL Chol Calc (NIH): 47 mg/dL (ref 0–99)
Triglycerides: 74 mg/dL (ref 0–149)
VLDL Cholesterol Cal: 15 mg/dL (ref 5–40)

## 2021-02-26 LAB — ECHOCARDIOGRAM COMPLETE
Area-P 1/2: 3.53 cm2
S' Lateral: 3.5 cm

## 2021-02-26 LAB — ALT: ALT: 49 IU/L — ABNORMAL HIGH (ref 0–32)

## 2021-02-27 ENCOUNTER — Telehealth: Payer: Self-pay

## 2021-02-27 MED ORDER — POTASSIUM CHLORIDE CRYS ER 20 MEQ PO TBCR: 40.0000 meq | EXTENDED_RELEASE_TABLET | Freq: Every day | ORAL | 3 refills | Status: DC

## 2021-02-27 NOTE — Telephone Encounter (Signed)
-----   Message from Freada Bergeron, MD sent at 02/27/2021  3:03 AM EDT ----- Her LDL is well controlled at 47. Her K is low at 3.2. Can we start her on potassium 2mEq daily? Otherwise, her renal function and electrolytes look good. Her liver function is stable. She should continue her crestor.

## 2021-02-27 NOTE — Telephone Encounter (Signed)
The patient has been notified of the result and verbalized understanding.  All questions (if any) were answered. Antonieta Iba, RN 02/27/2021 9:41 AM  Patient will start on potassium 40 meq daily. She states that she has been having problems with the Crestor causing some leg pain at night. She states that she will continue on it for another month and call back if she continues to have issues.

## 2021-02-27 NOTE — Telephone Encounter (Signed)
-----   Message from Freada Bergeron, MD sent at 02/27/2021  3:03 AM EDT ----- Her LDL is well controlled at 47. Her K is low at 3.2. Can we start her on potassium 65mEq daily? Otherwise, her renal function and electrolytes look good. Her liver function is stable. She should continue her crestor.

## 2021-03-02 ENCOUNTER — Telehealth: Payer: Self-pay | Admitting: Emergency Medicine

## 2021-03-02 DIAGNOSIS — R918 Other nonspecific abnormal finding of lung field: Secondary | ICD-10-CM

## 2021-03-02 NOTE — Telephone Encounter (Signed)
Spoke with pt who states she had no acute reasons to see Dr. Lamonte Sakai tomorrow so OV is canceled. Placed recall for 1 month for OV. Nothing further needed at this time.

## 2021-03-02 NOTE — Telephone Encounter (Signed)
Pt has completed her Echo and Ct for calcium scoring.  Dose pt need to have ENB rescheduled?

## 2021-03-02 NOTE — Telephone Encounter (Signed)
I scheduled ENB for 5/9 at 9:30 at [facility].  Covid test scheduled for 5/6.  LBCT has already sent disk from 4/1 CT over to [facility] due to ENB has previously scheduled and cancelled.  I spoke to pt & gave her appt info.

## 2021-03-02 NOTE — Telephone Encounter (Signed)
Noted by triage.  °Will close encounter.  °

## 2021-03-02 NOTE — Telephone Encounter (Signed)
Yes, it looks like both tests were reassuring. Let her know I'll work on setting it up

## 2021-03-03 ENCOUNTER — Ambulatory Visit: Payer: PRIVATE HEALTH INSURANCE | Admitting: Emergency Medicine

## 2021-03-06 ENCOUNTER — Other Ambulatory Visit (HOSPITAL_COMMUNITY)
Admission: RE | Admit: 2021-03-06 | Discharge: 2021-03-06 | Disposition: A | Discharge status: Home / Self Care | Payer: PRIVATE HEALTH INSURANCE | Source: Ambulatory Visit | Attending: Emergency Medicine | Admitting: Emergency Medicine

## 2021-03-06 ENCOUNTER — Other Ambulatory Visit: Payer: Self-pay

## 2021-03-06 ENCOUNTER — Encounter (HOSPITAL_COMMUNITY): Payer: Self-pay | Admitting: Emergency Medicine

## 2021-03-06 DIAGNOSIS — Z01812 Encounter for preprocedural laboratory examination: Secondary | ICD-10-CM | POA: Diagnosis present

## 2021-03-06 DIAGNOSIS — Z20822 Contact with and (suspected) exposure to covid-19: Secondary | ICD-10-CM | POA: Diagnosis not present

## 2021-03-06 LAB — SARS CORONAVIRUS 2 (TAT 6-24 HRS): SARS Coronavirus 2: NEGATIVE

## 2021-03-06 NOTE — Progress Notes (Signed)
PCP - Dr. Posey Pronto  Cardiologist - Dr. Johney Frame   Chest x-ray -  EKG - 01/27/21 Stress Test -  ECHO - 02/26/21 Cardiac Cath -   Fasting Blood Sugar:  100-200 Checks Blood Sugar:  1x/day    COVID TEST- 03/06/21 pending   Anesthesia review: n/a  -------------  SDW INSTRUCTIONS:  Your procedure is scheduled on 03/09/21. Please report to [facility] Main Entrance "A" at 0645 A.M., and check in at the Admitting office. Call this number if you have problems the morning of surgery: [phone]   Remember: Do not eat or drink after midnight the night before your surgery   Medications to take morning of surgery with a sip of water include: acetaminophen (TYLENOL) if needed  cetirizine (ZYRTEC) rosuvastatin (CRESTOR)  nitroGLYCERIN (NITROSTAT) if needed  ** PLEASE check your blood sugar the morning of your surgery when you wake up and every 2 hours until you get to the Short Stay unit.  If your blood sugar is less than 70 mg/dL, you will need to treat for low blood sugar: - Do not take insulin. - Treat a low blood sugar (less than 70 mg/dL) with  cup of clear juice (cranberry or apple), 4 glucose tablets, OR glucose gel. - Recheck blood sugar in 15 minutes after treatment (to make sure it is greater than 70 mg/dL). If your blood sugar is not greater than 70 mg/dL on recheck, call [phone] for further instructions.  Insulin Glargine [facility]) 5/8: AM usual (pt does not take PM dose- none), 5/9: 8 units    As of today, STOP taking any Aspirin (unless otherwise instructed by your surgeon), Aleve, Naproxen, Ibuprofen, Motrin, Advil, Goody's, BC's, all herbal medications, fish oil, and all vitamins.    The Morning of Surgery Do not wear jewelry, make-up or nail polish. Do not wear lotions, powders, or perfumes, or deodorant Do not shave 48 hours prior to surgery.   Do not bring valuables to the hospital. [facility] is not responsible for any belongings or valuables. If you  are a smoker, DO NOT Smoke 24 hours prior to surgery If you wear a CPAP at night please bring your mask the morning of surgery  Remember that you must have someone to transport you home after your surgery, and remain with you for 24 hours if you are discharged the same day. Please bring cases for contacts, glasses, hearing aids, dentures or bridgework because it cannot be worn into surgery.   Patients discharged the day of surgery will not be allowed to drive home.   Please shower the NIGHT BEFORE SURGERY and the MORNING OF SURGERY with DIAL Soap. Wear comfortable clothes the morning of surgery. Oral Hygiene is also important to reduce your risk of infection.  Remember - BRUSH YOUR TEETH THE MORNING OF SURGERY WITH YOUR REGULAR TOOTHPASTE  Patient denies shortness of breath, fever, cough and chest pain.

## 2021-03-09 ENCOUNTER — Ambulatory Visit (HOSPITAL_COMMUNITY): Payer: Self-pay | Admitting: Certified Registered"

## 2021-03-09 ENCOUNTER — Ambulatory Visit (HOSPITAL_COMMUNITY): Payer: Self-pay

## 2021-03-09 ENCOUNTER — Observation Stay (HOSPITAL_COMMUNITY): Payer: Self-pay

## 2021-03-09 ENCOUNTER — Encounter (HOSPITAL_COMMUNITY): Payer: Self-pay | Admitting: Emergency Medicine

## 2021-03-09 ENCOUNTER — Other Ambulatory Visit: Payer: Self-pay

## 2021-03-09 ENCOUNTER — Inpatient Hospital Stay (HOSPITAL_COMMUNITY)
Admission: RE | Admit: 2021-03-09 | Discharge: 2021-03-11 | DRG: 392 | Disposition: A | Discharge status: Home / Self Care | Payer: Self-pay | Attending: Emergency Medicine | Admitting: Emergency Medicine

## 2021-03-09 ENCOUNTER — Encounter (HOSPITAL_COMMUNITY)
Admission: RE | Disposition: A | Discharge status: Home / Self Care | Payer: Self-pay | Source: Home / Self Care | Attending: Emergency Medicine

## 2021-03-09 DIAGNOSIS — Z825 Family history of asthma and other chronic lower respiratory diseases: Secondary | ICD-10-CM

## 2021-03-09 DIAGNOSIS — Z801 Family history of malignant neoplasm of trachea, bronchus and lung: Secondary | ICD-10-CM

## 2021-03-09 DIAGNOSIS — J95811 Postprocedural pneumothorax: Secondary | ICD-10-CM | POA: Diagnosis present

## 2021-03-09 DIAGNOSIS — E119 Type 2 diabetes mellitus without complications: Secondary | ICD-10-CM | POA: Diagnosis present

## 2021-03-09 DIAGNOSIS — R490 Dysphonia: Secondary | ICD-10-CM | POA: Diagnosis present

## 2021-03-09 DIAGNOSIS — J939 Pneumothorax, unspecified: Secondary | ICD-10-CM

## 2021-03-09 DIAGNOSIS — E785 Hyperlipidemia, unspecified: Secondary | ICD-10-CM | POA: Diagnosis present

## 2021-03-09 DIAGNOSIS — R112 Nausea with vomiting, unspecified: Principal | ICD-10-CM | POA: Diagnosis present

## 2021-03-09 DIAGNOSIS — Z885 Allergy status to narcotic agent status: Secondary | ICD-10-CM

## 2021-03-09 DIAGNOSIS — R911 Solitary pulmonary nodule: Secondary | ICD-10-CM | POA: Diagnosis present

## 2021-03-09 DIAGNOSIS — Z833 Family history of diabetes mellitus: Secondary | ICD-10-CM

## 2021-03-09 DIAGNOSIS — J310 Chronic rhinitis: Secondary | ICD-10-CM | POA: Diagnosis present

## 2021-03-09 DIAGNOSIS — Z83438 Family history of other disorder of lipoprotein metabolism and other lipidemia: Secondary | ICD-10-CM

## 2021-03-09 DIAGNOSIS — I10 Essential (primary) hypertension: Secondary | ICD-10-CM | POA: Diagnosis present

## 2021-03-09 DIAGNOSIS — Z800 Family history of malignant neoplasm of digestive organs: Secondary | ICD-10-CM

## 2021-03-09 DIAGNOSIS — Z87891 Personal history of nicotine dependence: Secondary | ICD-10-CM

## 2021-03-09 DIAGNOSIS — Z794 Long term (current) use of insulin: Secondary | ICD-10-CM

## 2021-03-09 DIAGNOSIS — Z9889 Other specified postprocedural states: Secondary | ICD-10-CM

## 2021-03-09 DIAGNOSIS — Z803 Family history of malignant neoplasm of breast: Secondary | ICD-10-CM

## 2021-03-09 DIAGNOSIS — E876 Hypokalemia: Secondary | ICD-10-CM | POA: Diagnosis present

## 2021-03-09 DIAGNOSIS — Z1211 Encounter for screening for malignant neoplasm of colon: Secondary | ICD-10-CM

## 2021-03-09 DIAGNOSIS — Z419 Encounter for procedure for purposes other than remedying health state, unspecified: Secondary | ICD-10-CM

## 2021-03-09 DIAGNOSIS — Z8249 Family history of ischemic heart disease and other diseases of the circulatory system: Secondary | ICD-10-CM

## 2021-03-09 DIAGNOSIS — Z79899 Other long term (current) drug therapy: Secondary | ICD-10-CM

## 2021-03-09 HISTORY — PX: BRONCHIAL NEEDLE ASPIRATION BIOPSY: SHX5106

## 2021-03-09 HISTORY — PX: BRONCHIAL BRUSHINGS: SHX5108

## 2021-03-09 HISTORY — PX: VIDEO BRONCHOSCOPY WITH ENDOBRONCHIAL NAVIGATION: SHX6175

## 2021-03-09 HISTORY — PX: FIDUCIAL MARKER PLACEMENT: SHX6858

## 2021-03-09 HISTORY — PX: BRONCHIAL BIOPSY: SHX5109

## 2021-03-09 LAB — GLUCOSE, CAPILLARY
Glucose-Capillary: 176 mg/dL — ABNORMAL HIGH (ref 70–99)
Glucose-Capillary: 172 mg/dL — ABNORMAL HIGH (ref 70–99)
Glucose-Capillary: 149 mg/dL — ABNORMAL HIGH (ref 70–99)
Glucose-Capillary: 234 mg/dL — ABNORMAL HIGH (ref 70–99)
Glucose-Capillary: 256 mg/dL — ABNORMAL HIGH (ref 70–99)

## 2021-03-09 LAB — BASIC METABOLIC PANEL
Anion gap: 10 (ref 5–15)
BUN: 9 mg/dL (ref 8–23)
CO2: 27 mmol/L (ref 22–32)
Calcium: 9.1 mg/dL (ref 8.9–10.3)
Chloride: 101 mmol/L (ref 98–111)
Creatinine, Ser: 0.63 mg/dL (ref 0.44–1.00)
GFR, Estimated: >60 mL/min (ref >60)
Glucose, Bld: 167 mg/dL — ABNORMAL HIGH (ref 70–99)
Potassium: 3 mmol/L — ABNORMAL LOW (ref 3.5–5.1)
Sodium: 138 mmol/L (ref 135–145)

## 2021-03-09 LAB — CBC
HCT: 44.4 % (ref 36.0–46.0)
Hemoglobin: 14.9 g/dL (ref 12.0–15.0)
MCH: 30.1 pg (ref 26.0–34.0)
MCHC: 33.6 g/dL (ref 30.0–36.0)
MCV: 89.7 fL (ref 80.0–100.0)
Platelets: 200 10*3/uL (ref 150–400)
RBC: 4.95 MIL/uL (ref 3.87–5.11)
RDW: 13.8 % (ref 11.5–15.5)
WBC: 8.8 10*3/uL (ref 4.0–10.5)
nRBC: 0 % (ref 0.0–0.2)

## 2021-03-09 SURGERY — VIDEO BRONCHOSCOPY WITH ENDOBRONCHIAL NAVIGATION
Anesthesia: General

## 2021-03-09 MED ORDER — LACTATED RINGERS IV SOLN: INTRAVENOUS | Status: DC

## 2021-03-09 MED ORDER — ONDANSETRON HCL 4 MG/2ML IJ SOLN
INTRAMUSCULAR | Status: DC | PRN
  Administered 2021-03-09: 4 mg via INTRAVENOUS

## 2021-03-09 MED ORDER — ATENOLOL-CHLORTHALIDONE 50-25 MG PO TABS: 1.0000 | ORAL_TABLET | Freq: Every day | ORAL | Status: DC

## 2021-03-09 MED ORDER — ROCURONIUM BROMIDE 10 MG/ML (PF) SYRINGE
PREFILLED_SYRINGE | INTRAVENOUS | Status: DC | PRN
  Administered 2021-03-09: 40 mg via INTRAVENOUS
  Administered 2021-03-09: 10 mg via INTRAVENOUS

## 2021-03-09 MED ORDER — INSULIN ASPART 100 UNIT/ML IJ SOLN
0.0000 [IU] | Freq: Every day | INTRAMUSCULAR | Status: DC
  Administered 2021-03-09: 3 [IU] via SUBCUTANEOUS

## 2021-03-09 MED ORDER — DULAGLUTIDE 3 MG/0.5ML ~~LOC~~ SOAJ: 3.0000 mg | SUBCUTANEOUS | Status: DC

## 2021-03-09 MED ORDER — NITROGLYCERIN 0.4 MG SL SUBL: 0.4000 mg | SUBLINGUAL_TABLET | SUBLINGUAL | Status: DC | PRN

## 2021-03-09 MED ORDER — OXYCODONE HCL 5 MG/5ML PO SOLN: 5.0000 mg | Freq: Once | ORAL | Status: DC | PRN

## 2021-03-09 MED ORDER — AMISULPRIDE (ANTIEMETIC) 5 MG/2ML IV SOLN
10.0000 mg | Freq: Once | INTRAVENOUS | Status: DC | PRN
  Filled 2021-03-09: qty 4

## 2021-03-09 MED ORDER — FENTANYL CITRATE (PF) 100 MCG/2ML IJ SOLN
25.0000 ug | Freq: Once | INTRAMUSCULAR | Status: AC
  Administered 2021-03-09: 50 ug via INTRAVENOUS
  Filled 2021-03-09: qty 2

## 2021-03-09 MED ORDER — DEXAMETHASONE SODIUM PHOSPHATE 10 MG/ML IJ SOLN
INTRAMUSCULAR | Status: DC | PRN
  Administered 2021-03-09: 5 mg via INTRAVENOUS

## 2021-03-09 MED ORDER — FENTANYL CITRATE (PF) 100 MCG/2ML IJ SOLN
INTRAMUSCULAR | Status: AC
  Filled 2021-03-09: qty 2

## 2021-03-09 MED ORDER — KETOROLAC TROMETHAMINE 30 MG/ML IJ SOLN
30.0000 mg | Freq: Once | INTRAMUSCULAR | Status: DC
  Filled 2021-03-09 (×2): qty 1

## 2021-03-09 MED ORDER — MIDAZOLAM HCL 2 MG/2ML IJ SOLN
INTRAMUSCULAR | Status: DC | PRN
  Administered 2021-03-09 (×2): 1 mg via INTRAVENOUS

## 2021-03-09 MED ORDER — ROSUVASTATIN CALCIUM 5 MG PO TABS
10.0000 mg | ORAL_TABLET | Freq: Every day | ORAL | Status: DC
  Administered 2021-03-09 – 2021-03-11 (×2): 10 mg via ORAL
  Filled 2021-03-09: qty 2
  Filled 2021-03-09: qty 1
  Filled 2021-03-09: qty 2

## 2021-03-09 MED ORDER — FENTANYL CITRATE (PF) 100 MCG/2ML IJ SOLN
25.0000 ug | INTRAMUSCULAR | Status: DC | PRN
  Administered 2021-03-09 (×2): 50 ug via INTRAVENOUS

## 2021-03-09 MED ORDER — POTASSIUM CHLORIDE CRYS ER 20 MEQ PO TBCR
40.0000 meq | EXTENDED_RELEASE_TABLET | Freq: Every day | ORAL | Status: DC
  Filled 2021-03-09: qty 2

## 2021-03-09 MED ORDER — INSULIN GLARGINE 100 UNIT/ML ~~LOC~~ SOLN
16.0000 [IU] | Freq: Every day | SUBCUTANEOUS | Status: DC
  Administered 2021-03-09 – 2021-03-10 (×2): 16 [IU] via SUBCUTANEOUS
  Filled 2021-03-09 (×3): qty 0.16

## 2021-03-09 MED ORDER — KETOROLAC TROMETHAMINE 15 MG/ML IJ SOLN
15.0000 mg | Freq: Once | INTRAMUSCULAR | Status: AC
  Administered 2021-03-09: 15 mg via INTRAVENOUS
  Filled 2021-03-09: qty 1

## 2021-03-09 MED ORDER — OXYCODONE HCL 5 MG PO TABS: 5.0000 mg | ORAL_TABLET | Freq: Once | ORAL | Status: DC | PRN

## 2021-03-09 MED ORDER — LORATADINE 10 MG PO TABS
10.0000 mg | ORAL_TABLET | Freq: Every day | ORAL | Status: DC
  Administered 2021-03-11: 10 mg via ORAL
  Filled 2021-03-09 (×2): qty 1

## 2021-03-09 MED ORDER — ACETAMINOPHEN 500 MG PO TABS
1000.0000 mg | ORAL_TABLET | Freq: Four times a day (QID) | ORAL | Status: DC | PRN
  Administered 2021-03-09 – 2021-03-10 (×2): 1000 mg via ORAL
  Filled 2021-03-09 (×2): qty 2

## 2021-03-09 MED ORDER — ATENOLOL 25 MG PO TABS
50.0000 mg | ORAL_TABLET | Freq: Every day | ORAL | Status: DC
  Administered 2021-03-11: 50 mg via ORAL
  Filled 2021-03-09 (×2): qty 2

## 2021-03-09 MED ORDER — BASAGLAR KWIKPEN 100 UNIT/ML ~~LOC~~ SOPN: 16.0000 [IU] | PEN_INJECTOR | Freq: Every day | SUBCUTANEOUS | Status: DC

## 2021-03-09 MED ORDER — FENTANYL CITRATE (PF) 100 MCG/2ML IJ SOLN
INTRAMUSCULAR | Status: DC | PRN
  Administered 2021-03-09: 100 ug via INTRAVENOUS

## 2021-03-09 MED ORDER — ACETAMINOPHEN 10 MG/ML IV SOLN
1000.0000 mg | Freq: Once | INTRAVENOUS | Status: DC | PRN
  Filled 2021-03-09: qty 100

## 2021-03-09 MED ORDER — ONDANSETRON HCL 4 MG/2ML IJ SOLN
4.0000 mg | Freq: Four times a day (QID) | INTRAMUSCULAR | Status: DC | PRN
  Administered 2021-03-09 – 2021-03-10 (×4): 4 mg via INTRAVENOUS
  Filled 2021-03-09 (×4): qty 2

## 2021-03-09 MED ORDER — PROPOFOL 10 MG/ML IV BOLUS
INTRAVENOUS | Status: DC | PRN
  Administered 2021-03-09: 200 mg via INTRAVENOUS

## 2021-03-09 MED ORDER — ESMOLOL HCL 100 MG/10ML IV SOLN
INTRAVENOUS | Status: DC | PRN
  Administered 2021-03-09 (×3): 20 mg via INTRAVENOUS
  Administered 2021-03-09: 30 mg via INTRAVENOUS

## 2021-03-09 MED ORDER — SUGAMMADEX SODIUM 200 MG/2ML IV SOLN
INTRAVENOUS | Status: DC | PRN
  Administered 2021-03-09: 200 mg via INTRAVENOUS

## 2021-03-09 MED ORDER — SODIUM CHLORIDE 0.9% FLUSH
10.0000 mL | Freq: Three times a day (TID) | INTRAVENOUS | Status: DC
  Administered 2021-03-09 – 2021-03-11 (×3): 10 mL

## 2021-03-09 MED ORDER — CHLORHEXIDINE GLUCONATE 0.12 % MT SOLN
15.0000 mL | OROMUCOSAL | Status: AC
  Administered 2021-03-09: 15 mL via OROMUCOSAL
  Filled 2021-03-09 (×2): qty 15

## 2021-03-09 MED ORDER — VITAMIN D 25 MCG (1000 UNIT) PO TABS
2000.0000 [IU] | ORAL_TABLET | Freq: Every morning | ORAL | Status: DC
  Administered 2021-03-11: 2000 [IU] via ORAL
  Filled 2021-03-09: qty 2

## 2021-03-09 MED ORDER — MIDAZOLAM HCL 2 MG/2ML IJ SOLN
1.0000 mg | Freq: Once | INTRAMUSCULAR | Status: AC
  Administered 2021-03-09: 1 mg via INTRAVENOUS
  Filled 2021-03-09: qty 2

## 2021-03-09 MED ORDER — LIDOCAINE 2% (20 MG/ML) 5 ML SYRINGE
INTRAMUSCULAR | Status: DC | PRN
  Administered 2021-03-09: 60 mg via INTRAVENOUS

## 2021-03-09 MED ORDER — TRAMADOL HCL 50 MG PO TABS
50.0000 mg | ORAL_TABLET | Freq: Four times a day (QID) | ORAL | Status: DC | PRN
  Administered 2021-03-09 – 2021-03-10 (×2): 50 mg via ORAL
  Filled 2021-03-09 (×2): qty 1

## 2021-03-09 MED ORDER — CHLORTHALIDONE 25 MG PO TABS
25.0000 mg | ORAL_TABLET | Freq: Every day | ORAL | Status: DC
  Administered 2021-03-11: 25 mg via ORAL
  Filled 2021-03-09 (×3): qty 1

## 2021-03-09 MED ORDER — INSULIN ASPART 100 UNIT/ML IJ SOLN
0.0000 [IU] | Freq: Three times a day (TID) | INTRAMUSCULAR | Status: DC
  Administered 2021-03-10 – 2021-03-11 (×4): 3 [IU] via SUBCUTANEOUS

## 2021-03-09 MED ORDER — PROMETHAZINE HCL 25 MG/ML IJ SOLN: 6.2500 mg | INTRAMUSCULAR | Status: DC | PRN

## 2021-03-09 SURGICAL SUPPLY — 1 items: SuperLock fiducial marker ×2 IMPLANT

## 2021-03-09 NOTE — Anesthesia Procedure Notes (Signed)
Procedure Name: Intubation Date/Time: 03/09/2021 10:19 AM Performed by: Gaylene Brooks, CRNA Pre-anesthesia Checklist: Patient identified, Emergency Drugs available, Suction available and Patient being monitored Patient Re-evaluated:Patient Re-evaluated prior to induction Oxygen Delivery Method: Circle System Utilized Preoxygenation: Pre-oxygenation with 100% oxygen Induction Type: IV induction Ventilation: Mask ventilation without difficulty and Oral airway inserted - appropriate to patient size Laryngoscope Size: Sabra Heck and 2 Grade View: Grade II Tube type: Oral Tube size: 8.5 mm Number of attempts: 1 Airway Equipment and Method: Stylet and Oral airway Placement Confirmation: ETT inserted through vocal cords under direct vision,  positive ETCO2 and breath sounds checked- equal and bilateral Secured at: 22 cm Tube secured with: Tape Dental Injury: Teeth and Oropharynx as per pre-operative assessment

## 2021-03-09 NOTE — Anesthesia Postprocedure Evaluation (Signed)
Anesthesia Post Note  Patient: Sonya Walton  Procedure(s) Performed: VIDEO BRONCHOSCOPY WITH ENDOBRONCHIAL NAVIGATION (N/A ) BRONCHIAL BIOPSIES BRONCHIAL BRUSHINGS BRONCHIAL NEEDLE ASPIRATION BIOPSIES FIDUCIAL MARKER PLACEMENT     Patient location during evaluation: PACU Anesthesia Type: General Level of consciousness: awake Pain management: pain level controlled Vital Signs Assessment: post-procedure vital signs reviewed and stable Respiratory status: spontaneous breathing, nonlabored ventilation, respiratory function stable and patient connected to nasal cannula oxygen Cardiovascular status: blood pressure returned to baseline and stable Postop Assessment: no apparent nausea or vomiting Anesthetic complications: no   No complications documented.  Last Vitals:  Vitals:   03/09/21 1403 03/09/21 1413  BP: 133/79 (!) 159/88  Pulse: 88 92  Resp: 16 18  Temp:    SpO2: 94% 93%    Last Pain:  Vitals:   03/09/21 1556  TempSrc:   PainSc: 5                  Travarus Trudo P Crystalmarie Yasin

## 2021-03-09 NOTE — Progress Notes (Signed)
Assisted Dr Lamonte Sakai and Marni Griffon with CT placement. Placed CT to 20 cm suction.. Small airleak initially.

## 2021-03-09 NOTE — Discharge Instructions (Signed)
Flexible Bronchoscopy, Care After This sheet gives you information about how to care for yourself after your test. Your doctor may also give you more specific instructions. If you have problems or questions, contact your doctor. Follow these instructions at home: Eating and drinking  Do not eat or drink anything (not even water) for 2 hours after your test, or until your numbing medicine (local anesthetic) wears off.  When your numbness is gone and your cough and gag reflexes have come back, you may: ? Eat only soft foods. ? Slowly drink liquids.  The day after the test, go back to your normal diet. Driving  Do not drive for 24 hours if you were given a medicine to help you relax (sedative).  Do not drive or use heavy machinery while taking prescription pain medicine. General instructions   Take over-the-counter and prescription medicines only as told by your doctor.  Return to your normal activities as told. Ask what activities are safe for you.  Do not use any products that have nicotine or tobacco in them. This includes cigarettes and e-cigarettes. If you need help quitting, ask your doctor.  Keep all follow-up visits as told by your doctor. This is important. It is very important if you had a tissue sample (biopsy) taken. Get help right away if:  You have shortness of breath that gets worse.  You get light-headed.  You feel like you are going to pass out (faint).  You have chest pain.  You cough up: ? More than a little blood. ? More blood than before. Summary  Do not eat or drink anything (not even water) for 2 hours after your test, or until your numbing medicine wears off.  Do not use cigarettes. Do not use e-cigarettes.  Get help right away if you have chest pain.  Please call our office for any questions or concerns. [phone].   This information is not intended to replace advice given to you by your health care provider. Make sure you discuss any  questions you have with your health care provider. Document Released: 08/15/2009 Document Revised: 09/30/2017 Document Reviewed: 11/05/2016 Elsevier Patient Education  2020 [employer].

## 2021-03-09 NOTE — Anesthesia Preprocedure Evaluation (Addendum)
Anesthesia Evaluation  Patient identified by MRN, date of birth, ID band Patient awake    Reviewed: Allergy & Precautions, NPO status , Patient's Chart, lab work & pertinent test results  Airway Mallampati: III  TM Distance: >3 FB Neck ROM: Full    Dental  (+) Edentulous Upper   Pulmonary former smoker,    Pulmonary exam normal breath sounds clear to auscultation       Cardiovascular hypertension, Pt. on medications and Pt. on home beta blockers Normal cardiovascular exam Rhythm:Regular Rate:Normal     Neuro/Psych negative neurological ROS  negative psych ROS   GI/Hepatic negative GI ROS, Neg liver ROS,   Endo/Other  diabetes, Insulin Dependent  Renal/GU negative Renal ROS     Musculoskeletal  (+) Arthritis ,   Abdominal (+) + obese,   Peds  Hematology HLD   Anesthesia Other Findings MASS RIGHT LUNG  Reproductive/Obstetrics                            Anesthesia Physical Anesthesia Plan  ASA: III  Anesthesia Plan: General   Post-op Pain Management:    Induction: Intravenous  PONV Risk Score and Plan: 3 and Ondansetron, Dexamethasone, Midazolam and Treatment may vary due to age or medical condition  Airway Management Planned: Oral ETT  Additional Equipment:   Intra-op Plan:   Post-operative Plan: Extubation in OR  Informed Consent: I have reviewed the patients History and Physical, chart, labs and discussed the procedure including the risks, benefits and alternatives for the proposed anesthesia with the patient or authorized representative who has indicated his/her understanding and acceptance.     Dental advisory given  Plan Discussed with: CRNA  Anesthesia Plan Comments:        Anesthesia Quick Evaluation

## 2021-03-09 NOTE — Transfer of Care (Signed)
Immediate Anesthesia Transfer of Care Note  Patient: [name]  Procedure(s) Performed: VIDEO BRONCHOSCOPY WITH ENDOBRONCHIAL NAVIGATION (N/A ) BRONCHIAL BIOPSIES BRONCHIAL BRUSHINGS BRONCHIAL NEEDLE ASPIRATION BIOPSIES FIDUCIAL MARKER PLACEMENT  Patient Location: Endoscopy Unit  Anesthesia Type:General  Level of Consciousness: awake, oriented, drowsy and patient cooperative  Airway & Oxygen Therapy: Patient Spontanous Breathing and Patient connected to face mask oxygen  Post-op Assessment: Report given to RN, Post -op Vital signs reviewed and stable and Patient moving all extremities X 4  Post vital signs: Reviewed and stable  Last Vitals:  Vitals Value Taken Time  BP    Temp    Pulse 93 03/09/21 1146  Resp 16 03/09/21 1146  SpO2 92 % 03/09/21 1146  Vitals shown include unvalidated device data.  Last Pain:  Vitals:   03/09/21 0725  TempSrc:   PainSc: 0-No pain      Patients Stated Pain Goal: 0 (47/34/03 7096)  Complications: No complications documented.

## 2021-03-09 NOTE — Op Note (Signed)
Video Bronchoscopy with Electromagnetic Navigation Procedure Note  Date of Operation: 03/09/2021  Pre-op Diagnosis: Right upper lobe pulmonary nodule  Post-op Diagnosis: Same  Surgeon: Baltazar Apo  Assistants: None  Anesthesia: General endotracheal anesthesia  Operation: Flexible video fiberoptic bronchoscopy with electromagnetic navigation and biopsies.  Estimated Blood Loss: 25 cc  Complications: None apparent  Indications and History: Sonya Walton is a 60 y.o. female with history of tobacco use.  She has had a a groundglass right upper lobe pulmonary nodule found on lung cancer screening that has slowly increased in size.  Not hypermetabolic on PET scan.  Recommendation was made to achieve tissue diagnosis via navigational bronchoscopy.  The risks, benefits, complications, treatment options and expected outcomes were discussed with the patient.  The possibilities of pneumothorax, pneumonia, reaction to medication, pulmonary aspiration, perforation of a viscus, bleeding, failure to diagnose a condition and creating a complication requiring transfusion or operation were discussed with the patient who freely signed the consent.    Description of Procedure: The patient was seen in the Preoperative Area, was examined and was deemed appropriate to proceed.  The patient was taken to [facility] endoscopy room 2, identified as Sonya Walton and the procedure verified as Flexible Video Fiberoptic Bronchoscopy.  A Time Out was held and the above information confirmed.   Prior to the date of the procedure a high-resolution CT scan of the chest was performed. Utilizing [place] a virtual tracheobronchial tree was generated to allow the creation of distinct navigation pathways to the patient's parenchymal abnormalities. After being taken to the operating room general anesthesia was initiated and the patient  was orally intubated. The video fiberoptic bronchoscope was introduced  via the endotracheal tube and a general inspection was performed which showed normal airways throughout. The extendable working channel and locator guide were introduced into the bronchoscope. The distinct navigation pathways prepared prior to this procedure were then utilized to navigate to within 0.4 cm of patient's lesion identified on CT scan. The extendable working channel was secured into place and the locator guide was withdrawn. Under fluoroscopic guidance transbronchial needle brushings, transbronchial Wang needle biopsies, and transbronchial forceps biopsies were performed to be sent for cytology and pathology. A bronchioalveolar lavage was performed in the right upper lobe adjacent to the pulmonary nodule and sent for cytology and microbiology (bacterial, fungal, AFB smears and cultures).  2 fiducial markers were placed flanking the groundglass nodule to facilitate XRT should this become necessary in the future.  At the end of the procedure a general airway inspection was performed and there was no evidence of active bleeding. The bronchoscope was removed.  The patient tolerated the procedure well. There was no significant blood loss and there were no obvious complications. A post-procedural chest x-ray is pending.  Samples: 1. Transbronchial needle brushings from right upper lobe nodule 2. Transbronchial Wang needle biopsies from right upper lobe nodule 3. Transbronchial forceps biopsies from right upper lobe nodule 4. Bronchoalveolar lavage from right upper lobe  Plans:  The patient will be discharged from the PACU to home when recovered from anesthesia and after chest x-ray is reviewed. We will review the cytology, pathology and microbiology results with the patient when they become available. Outpatient followup will be with Dr. Lamonte Walton.    Sonya Walton S. 03/09/2021

## 2021-03-09 NOTE — Procedures (Signed)
Insertion of Chest Tube Procedure Note  WINDY DUDEK  [id number]  14-May-1961  Date:03/09/21  Time:6:18 PM    Provider Performing: Clementeen Graham   Procedure: Chest Tube Insertion [phone])  Indication(s) Pneumothorax  Consent Risks of the procedure as well as the alternatives and risks of each were explained to the patient and/or caregiver.  Consent for the procedure was obtained and is signed in the bedside chart  Anesthesia Topical only with 1% lidocaine    Time Out Verified patient identification, verified procedure, site/side was marked, verified correct patient position, special equipment/implants available, medications/allergies/relevant history reviewed, required imaging and test results available.   Sterile Technique Maximal sterile technique including full sterile barrier drape, hand hygiene, sterile gown, sterile gloves, mask, hair covering, sterile ultrasound probe cover (if used).   Procedure Description Ultrasound not used to identify appropriate pleural anatomy for placement and overlying skin marked. Area of placement cleaned and draped in sterile fashion.  A 14 French pigtail pleural catheter was placed into the right anterior pleural space using Seldinger technique. Appropriate return of air was obtained.  The tube was connected to atrium and placed on -20 cm H2O wall suction.   Complications/Tolerance None; patient tolerated the procedure well. Chest X-ray is ordered to verify placement.   EBL Minimal  Specimen(s) none   Erick Colace ACNP-BC [facility] Pager # [phone] OR # [phone] if no answer

## 2021-03-09 NOTE — H&P (Signed)
Sonya Walton is an 60 y.o. female.   Chief Complaint:Right upper lobe pulmonary nodule HPI: 60 year old former smoker with diabetes, allergic rhinitis, hypertension who participates in lung cancer screening program.  She had an enlarging ill-defined hazy right upper lobe pulmonary nodule on most recent screening CT 12/09/2020.  This was not hypermetabolic by PET scan 3/32/9518.  She has a family history of lung cancer in her brother.  There was no mediastinal or hilar adenopathy.  Recommendation made to pursue a tissue diagnosis via navigational bronchoscopy.  She had a preoperative cardiac work-up.  She underwent echocardiogram 02/26/2021, LVEF 55-60% with normal diastolic function, normal RV size and function.  Coronary CT with a calcium score of 12 (99th percentile).  She has been started on a statin  She denies any new issues, new problems.  She has had some increased congestion, some hoarse voice over the last week.    Past Medical History:  Diagnosis Date  . Allergy   . Arthritis   . Diabetes mellitus without complication ([place])   . Hoarse voice quality 01/19/2016  . Hypertension    states under control with med., has been on med. since 2011  . Immature cataract   . 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 Partial tear of right rotator cuff 12/2015  . Wears dentures    upper    Past Surgical History:  Procedure Laterality Date  . BREAST BIOPSY Left   . BUNIONECTOMY Bilateral 1994  . CHOLECYSTECTOMY  01/17/2012   Procedure: LAPAROSCOPIC CHOLECYSTECTOMY;  Surgeon: Harl Bowie, MD;  Location: [facility];  Service: General;  Laterality: N/A;  . PUBOVAGINAL SLING  03/05/2003  . SHOULDER ARTHROSCOPY WITH DISTAL CLAVICLE RESECTION Right 01/26/2016   Procedure: SHOULDER ARTHROSCOPY WITH DISTAL CLAVICLE RESECTION;  Surgeon: Tania Ade, MD;  Location: [facility];  Service: Orthopedics;  Laterality: Right;  . SHOULDER ARTHROSCOPY WITH SUBACROMIAL DECOMPRESSION Right  01/26/2016   Procedure: SHOULDER ARTHROSCOPY WITH SUBACROMIAL DECOMPRESSION DEBRIDEMENT;  Surgeon: Tania Ade, MD;  Location: [facility];  Service: Orthopedics;  Laterality: Right;  . TONSILLECTOMY  1975  . TOTAL VAGINAL HYSTERECTOMY  03/05/2003  . TUBAL LIGATION  1981  . VAGINAL HYSTERECTOMY  2004   partial     Family History  Problem Relation Age of Onset  . Liver cancer Father   . Cancer Father        lung and liver  . Heart disease Mother   . Hyperlipidemia Mother   . Hypertension Mother   . Heart failure Mother   . Hypertension Other   . Diabetes Other   . Asthma Other   . Diabetes type II Sister   . Breast cancer Sister   . Asthma Sister   . COPD Sister   . Diabetes type II Brother   . Cancer Paternal Aunt        lung  . Hypertension Sister   . Hypertension Brother   . Diabetes Brother   . Asthma Brother   . Cancer Other        lung  . Colon polyps Neg Hx   . Esophageal cancer Neg Hx   . Rectal cancer Neg Hx   . Stomach cancer Neg Hx    Social History:  reports that she quit smoking about 3 years ago. Her smoking use included cigarettes. She started smoking about 39 years ago. She has a 37.00 pack-year smoking history. She has never used smokeless tobacco. She reports current alcohol  use. She reports that she does not use drugs.  Allergies:  Allergies  Allergen Reactions  . Percocet [Oxycodone-Acetaminophen] Other (See Comments)    Dysphoria    Facility-Administered Medications Prior to Admission  Medication Dose Route Frequency Provider Last Rate Last Admin  . 0.9 %  sodium chloride infusion  500 mL Intravenous Once Cirigliano, Vito V, DO       Medications Prior to Admission  Medication Sig Dispense Refill  . acetaminophen (TYLENOL) 500 MG tablet Take 1,000 mg by mouth every 6 (six) hours as needed for mild pain or moderate pain.    Marland Kitchen atenolol-chlorthalidone (TENORETIC) 50-25 MG tablet TAKE 1 TABLET BY MOUTH EVERY DAY (Patient taking  differently: Take 1 tablet by mouth daily.) 90 tablet 3  . cetirizine (ZYRTEC) 10 MG tablet Chew 10 mg by mouth daily.    . Cholecalciferol (VITAMIN D3) 2000 UNITS TABS Take 2,000 Units by mouth every morning.    . Dulaglutide 3 MG/0.5ML SOPN Inject 3 mg into the skin every Thursday.    . Insulin Glargine (BASAGLAR KWIKPEN) 100 UNIT/ML SOPN Inject 0.22 mLs (22 Units total) into the skin daily. (Patient taking differently: Inject 16 Units into the skin daily.)    . nitroGLYCERIN (NITROSTAT) 0.4 MG SL tablet Place 1 tablet (0.4 mg total) under the tongue every 5 (five) minutes as needed for chest pain. 30 tablet 12  . potassium chloride SA (KLOR-CON) 20 MEQ tablet Take 2 tablets (40 mEq total) by mouth daily. 180 tablet 3  . rosuvastatin (CRESTOR) 10 MG tablet Take 1 tablet (10 mg total) by mouth daily. 90 tablet 3  . blood glucose meter kit and supplies KIT Inject 1 each into the skin as directed. Use meter once daily to measure blood glucose. 1 each 0  . glucose blood ([employer] ADVANCED GLUCOSE TEST) test strip Please specify directions, refills and quantity\ Once daily, [id number], 3 refills, 100 quantity Dr Lattie Haw MD 100 each 0  . Insulin Pen Needle (PEN NEEDLES) 32G X 6 MM MISC 1 each by Does not apply route daily. 50 each 6  . metoprolol tartrate (LOPRESSOR) 100 MG tablet Take 1 tablet (100 mg total) two hours prior to CT scan (Patient not taking: Reported on 03/02/2021) 1 tablet 0    Results for orders placed or performed during the hospital encounter of 03/09/21 (from the past 48 hour(s))  Glucose, capillary     Status: Abnormal   Collection Time: 03/09/21  7:08 AM  Result Value Ref Range   Glucose-Capillary 176 (H) 70 - 99 mg/dL    Comment: Glucose reference range applies only to samples taken after fasting for at least 8 hours.   No results found.  Review of Systems  Blood pressure (!) 158/80, pulse 100, temperature 98.2 F (36.8 C), temperature source Oral, resp. rate 18,  height _0  (1.6 m), weight 98.4 kg, SpO2 96 %. Physical Exam  Gen: Pleasant, obese woman, in no distress,  normal affect  ENT: No lesions,  mouth clear,  oropharynx clear, no postnasal drip, hoarse voice  Neck: No JVD, no stridor  Lungs: No use of accessory muscles, distant, no crackles or wheezing on normal respiration, no wheeze on forced expiration  Cardiovascular: RRR, heart sounds normal, no murmur or gallops, no peripheral edema  Abdomen: soft and NT, no HSM,  BS normal  Musculoskeletal: No deformities, no cyanosis or clubbing  Neuro: alert, awake, non focal  Skin: Warm, no lesions or rash  Assessment/Plan Groundglass enlarging right  upper lobe pulmonary nodule and patient with tobacco history and family history of lung cancer.  Most consistent with an adenocarcinoma of the lung.  No significant hypermetabolic activity on PET.  Plan today for navigational bronchoscopy to obtain tissue diagnosis, culture data.  Procedure discussed in detail with the patient.  She understands the rationale, risk, benefits and agrees to proceed.  She had a reassuring cardiac evaluation.  No barriers identified.  Collene Gobble, MD 03/09/2021, 7:49 AM

## 2021-03-10 ENCOUNTER — Observation Stay (HOSPITAL_COMMUNITY): Payer: Self-pay

## 2021-03-10 DIAGNOSIS — R112 Nausea with vomiting, unspecified: Secondary | ICD-10-CM | POA: Diagnosis present

## 2021-03-10 LAB — GLUCOSE, CAPILLARY
Glucose-Capillary: 182 mg/dL — ABNORMAL HIGH (ref 70–99)
Glucose-Capillary: 168 mg/dL — ABNORMAL HIGH (ref 70–99)
Glucose-Capillary: 153 mg/dL — ABNORMAL HIGH (ref 70–99)
Glucose-Capillary: 120 mg/dL — ABNORMAL HIGH (ref 70–99)
Glucose-Capillary: 198 mg/dL — ABNORMAL HIGH (ref 70–99)

## 2021-03-10 LAB — CBC
HCT: 43.6 % (ref 36.0–46.0)
Hemoglobin: 14.8 g/dL (ref 12.0–15.0)
MCH: 30.1 pg (ref 26.0–34.0)
MCHC: 33.9 g/dL (ref 30.0–36.0)
MCV: 88.6 fL (ref 80.0–100.0)
Platelets: 224 10*3/uL (ref 150–400)
RBC: 4.92 MIL/uL (ref 3.87–5.11)
RDW: 13.9 % (ref 11.5–15.5)
WBC: 14.3 10*3/uL — ABNORMAL HIGH (ref 4.0–10.5)
nRBC: 0 % (ref 0.0–0.2)

## 2021-03-10 LAB — BASIC METABOLIC PANEL
Anion gap: 7 (ref 5–15)
BUN: 13 mg/dL (ref 8–23)
CO2: 27 mmol/L (ref 22–32)
Calcium: 8.8 mg/dL — ABNORMAL LOW (ref 8.9–10.3)
Chloride: 101 mmol/L (ref 98–111)
Creatinine, Ser: 0.72 mg/dL (ref 0.44–1.00)
GFR, Estimated: >60 mL/min (ref >60)
Glucose, Bld: 159 mg/dL — ABNORMAL HIGH (ref 70–99)
Potassium: 3 mmol/L — ABNORMAL LOW (ref 3.5–5.1)
Sodium: 135 mmol/L (ref 135–145)

## 2021-03-10 LAB — CYTOLOGY - NON PAP

## 2021-03-10 LAB — MAGNESIUM: Magnesium: 2 mg/dL (ref 1.7–2.4)

## 2021-03-10 MED ORDER — LACTATED RINGERS IV BOLUS: 500.0000 mL | Freq: Once | INTRAVENOUS | Status: DC

## 2021-03-10 MED ORDER — PROCHLORPERAZINE EDISYLATE 10 MG/2ML IJ SOLN
10.0000 mg | Freq: Once | INTRAMUSCULAR | Status: AC
  Administered 2021-03-10: 10 mg via INTRAVENOUS
  Filled 2021-03-10: qty 2

## 2021-03-10 MED ORDER — BISACODYL 10 MG RE SUPP: 10.0000 mg | Freq: Every day | RECTAL | Status: DC | PRN

## 2021-03-10 MED ORDER — LACTATED RINGERS IV SOLN: INTRAVENOUS | Status: AC

## 2021-03-10 MED ORDER — ONDANSETRON HCL 4 MG PO TABS: 4.0000 mg | ORAL_TABLET | Freq: Every day | ORAL | 0 refills | Status: DC | PRN

## 2021-03-10 MED ORDER — BASAGLAR KWIKPEN 100 UNIT/ML ~~LOC~~ SOPN: 16.0000 [IU] | PEN_INJECTOR | Freq: Every day | SUBCUTANEOUS | Status: DC

## 2021-03-10 MED ORDER — POTASSIUM CHLORIDE 10 MEQ/100ML IV SOLN
10.0000 meq | INTRAVENOUS | Status: AC
  Administered 2021-03-10 (×3): 10 meq via INTRAVENOUS
  Filled 2021-03-10 (×3): qty 100

## 2021-03-10 NOTE — Progress Notes (Signed)
Pt continues to be nauseous, not tolerating sips of water.  Family is concerned about DC today.  Zofran 4mg  administered  at this time, Dr Lamonte Sakai text about concerns.

## 2021-03-10 NOTE — Plan of Care (Signed)
Pt had nausea this AM, medicated and feeling better.   Problem: Education: Goal: Knowledge of General Education information will improve Description: Including pain rating scale, medication(s)/side effects and non-pharmacologic comfort measures Outcome: Progressing   Problem: Health Behavior/Discharge Planning: Goal: Ability to manage health-related needs will improve Outcome: Progressing   Problem: Clinical Measurements: Goal: Ability to maintain clinical measurements within normal limits will improve Outcome: Progressing

## 2021-03-10 NOTE — Progress Notes (Signed)
Per Dr Lamonte Sakai, place CT to water seal.

## 2021-03-10 NOTE — Progress Notes (Signed)
PCCM Interval Note  Reviewed chest x-ray from 1352, no recurrence of her pneumothorax on waterseal.  Removed the patient's pigtail chest tube, cleaned with Betadine and alcohol, dressed with sterile gauze and Tegaderm.  No complications, well-tolerated.  If no recurrence of dyspnea, chest discomfort then we will plan to discharge the patient from observation status to home.  She has follow-up with me on 03/13/2021 and will need a chest x-ray at that visit.  Baltazar Apo, MD, PhD 03/10/2021, 2:02 PM [facility] [phone] or if no answer before 7:00PM call [phone] For any issues after 7:00PM please call eLink [phone]

## 2021-03-10 NOTE — Progress Notes (Addendum)
Holding PO meds until nausea is better control.   Dr Lamonte Sakai notified via secured chat.

## 2021-03-10 NOTE — Progress Notes (Signed)
JOSELYNE SPAKE is an 60 y.o. female.   Chief Complaint:Right upper lobe pulmonary nodule HPI: 60 year old former smoker with diabetes, allergic rhinitis, hypertension who participates in lung cancer screening program.  She had an enlarging ill-defined hazy right upper lobe pulmonary nodule on most recent screening CT 12/09/2020.  This was not hypermetabolic by PET scan 0/32/1224.  She has a family history of lung cancer in her brother.  There was no mediastinal or hilar adenopathy.  Recommendation made to pursue a tissue diagnosis via navigational bronchoscopy.  She had a preoperative cardiac work-up.  She underwent echocardiogram 02/26/2021, LVEF 55-60% with normal diastolic function, normal RV size and function.  Navigational bronchoscopy for right upper lobe nodule biopsies performed 5/9.  Complicated by small right pneumothorax.  Anterior pigtail chest tube placed  Interval events: Chest x-ray this morning shows resolution of the pneumothorax (on suction) Tolerated dinner last night but nausea and emesis this morning after breakfast  Exam  Blood pressure (!) 102/58, pulse 80, temperature 98.2 F (36.8 C), temperature source Oral, resp. rate 17, height 5\' 3"  (1.6 m), weight 98.4 kg, SpO2 95 %.  Gen: Pleasant, obese woman, feeling acutely ill with nausea  ENT: No lesions,  mouth clear,  oropharynx clear, no postnasal drip, hoarse voice  Neck: No JVD, no stridor  Lungs: Distant but clear bilaterally.  Chest tube in place.  No air leak noted  Cardiovascular: RRR, heart sounds normal  Abdomen: soft and NT, no HSM,  BS normal  Musculoskeletal: No deformities, no cyanosis or clubbing  Neuro: alert, awake, non focal  Skin: Warm, no lesions or rash  CXR 5/10 reviewed, chest tube in good position, no evidence of pneumothorax  Assessment/Plan  Small right pneumothorax following navigational bronchoscopy to evaluate right upper lobe pulmonary nodule.  Resolved on suction on chest x-ray  5/10 morning -Placed to waterseal 5/10 -Repeat chest x-ray after several hours -If lung well inflated then plan to remove pigtail catheter -Hopefully home soon after chest tube discontinued.  Nausea with emesis.  Unclear cause, question whether it is been related to discomfort, narcotic use to control pain with chest tube -Zofran ordered -Follow for resolution  Hypertension -On atenolol, chlorthalidone.  Continue  Diabetes -On insulin glargine 16 units nightly, sliding scale regular insulin  Hyperlipidemia -Home regimen Crestor  Chronic rhinitis -Zyrtec substituted with loratadine  Collene Gobble, MD 03/10/2021, 9:58 AM

## 2021-03-10 NOTE — Progress Notes (Addendum)
   PCCM Interval Note   Pt scheduled to be discharged home today from observation.  Pigtail removed this morning.    However, notified by bedside RN that patient continues to have nausea and vomiting episodes, has not been able to tolerate hardly any PO's including water, meds, or food.  She has not been able to tolerate any PO meds, including home meds.  RN reports SBP ~100, despite not having any BP meds today.  Family concerned about taking her home like this.  Patient evaluated. Daughter at bedside.  Patient denies any current SOB, CP, abd pain.  Denies any current nausea.  Endorses she is hungry but every time she tries to eat, she gets nauseated and vomits.  Patient has voided x 1 and now w/ purwick.  She has not had her daily BM but also not ate very much over the last two days.  Denies any previous issues with N/V with anesthesia.    No improvements despite zofran x 3 doses and compazine   Vitals:   03/10/21 1025 03/10/21 1430  BP: (!) 105/40 (!) 102/47  Pulse: 74 68  Resp: 16 16  Temp: 97.8 F (36.6 C) 97.6 F (36.4 C)  SpO2: 94% 93%   General:  Very pleasant older adult female lying in bed, talking on phone in NAD HEENT: MM pink/dry Neuro: AOx4, MAE CV: rr PULM:  Non labored, clear/ diminished, no wheeze GI: soft, bs hypo, obese, NT, purwick Extremities: warm/dry, no LE edema   Labs noted of K 3- replacement ordered but not given 2/2 N/V    Nausea and vomiting of unclear etiology Hypokalemia - ddx include post op N/V, ?gastroparesis given DM hx, electrolyte imbalance, less doubtful developing ileus (only received 3 doses of narcotics)  P:  Admit to inpatient/ tele monitoring given electrolyte imbalances and multiple dose of zofran- monitor for QTc prolongation EKG now Add on mag given chronic hypokalemia, goal Mag > 2 KCL 10 meq x 4 runs (w/MIVF) LR at 100 ml/hr for 1 liter Recheck BMET in am  Continue zofran prn- hold if QTc > 500 Consider prokinetic/ reglan  if ongoing nausea/ vomiting Clear liquid diet as tolerated, can advance once tolerating this No concern for ileus.  Patient does not want bowel regimen at this time- doesn't feel constipated.   OOB to chair- progress mobility   Hopeful to be discharged home in am.  Discussed with patient and daughter.  All questions answered.   Discussed with Dr. Lamonte Sakai.       Kennieth Rad, ACNP [facility] 03/10/2021, 5:57 PM

## 2021-03-10 NOTE — Progress Notes (Signed)
Pt had emesis after compazine 10 mg IV.  Provider Dr Lamonte Sakai at bedside was notified.

## 2021-03-10 NOTE — Progress Notes (Signed)
Pt reports to have nausea, Dr Philemon Kingdom paged.  Dr Lamonte Sakai entered medication for nausea and it will be administered.

## 2021-03-11 ENCOUNTER — Encounter (HOSPITAL_COMMUNITY): Payer: Self-pay | Admitting: Emergency Medicine

## 2021-03-11 ENCOUNTER — Inpatient Hospital Stay (HOSPITAL_COMMUNITY): Payer: Self-pay

## 2021-03-11 DIAGNOSIS — R112 Nausea with vomiting, unspecified: Principal | ICD-10-CM

## 2021-03-11 LAB — ACID FAST SMEAR (AFB, MYCOBACTERIA): Acid Fast Smear: NEGATIVE

## 2021-03-11 LAB — BASIC METABOLIC PANEL
Anion gap: 7 (ref 5–15)
BUN: 8 mg/dL (ref 8–23)
CO2: 29 mmol/L (ref 22–32)
Calcium: 8.5 mg/dL — ABNORMAL LOW (ref 8.9–10.3)
Chloride: 102 mmol/L (ref 98–111)
Creatinine, Ser: 0.63 mg/dL (ref 0.44–1.00)
GFR, Estimated: >60 mL/min (ref >60)
Glucose, Bld: 102 mg/dL — ABNORMAL HIGH (ref 70–99)
Potassium: 3.4 mmol/L — ABNORMAL LOW (ref 3.5–5.1)
Sodium: 138 mmol/L (ref 135–145)

## 2021-03-11 LAB — CBC
HCT: 41.2 % (ref 36.0–46.0)
Hemoglobin: 13.6 g/dL (ref 12.0–15.0)
MCH: 29.8 pg (ref 26.0–34.0)
MCHC: 33 g/dL (ref 30.0–36.0)
MCV: 90.2 fL (ref 80.0–100.0)
Platelets: 186 10*3/uL (ref 150–400)
RBC: 4.57 MIL/uL (ref 3.87–5.11)
RDW: 14 % (ref 11.5–15.5)
WBC: 9.4 10*3/uL (ref 4.0–10.5)
nRBC: 0 % (ref 0.0–0.2)

## 2021-03-11 LAB — GLUCOSE, CAPILLARY
Glucose-Capillary: 156 mg/dL — ABNORMAL HIGH (ref 70–99)
Glucose-Capillary: 179 mg/dL — ABNORMAL HIGH (ref 70–99)

## 2021-03-11 MED ORDER — POTASSIUM CHLORIDE 10 MEQ/100ML IV SOLN
10.0000 meq | Freq: Once | INTRAVENOUS | Status: AC
  Administered 2021-03-11: 10 meq via INTRAVENOUS
  Filled 2021-03-11: qty 100

## 2021-03-11 MED ORDER — POTASSIUM CHLORIDE 10 MEQ/100ML IV SOLN
10.0000 meq | INTRAVENOUS | Status: AC
  Administered 2021-03-11 (×3): 10 meq via INTRAVENOUS
  Filled 2021-03-11 (×3): qty 100

## 2021-03-11 NOTE — Progress Notes (Signed)
PCCM Interval Note  S: Ms. Bristol feels better this morning.  No longer nauseated.  She was able to tolerate clear diet last night for dinner No chest discomfort, dyspnea.  Vitals:   03/10/21 1025 03/10/21 1430 03/10/21 2104 03/11/21 0514  BP: (!) 105/40 (!) 102/47 123/66 139/67  Pulse: 74 68 78   Resp: 16 16 17 16   Temp: 97.8 F (36.6 C) 97.6 F (36.4 C) 97.7 F (36.5 C) 97.7 F (36.5 C)  TempSrc: Oral Oral Oral Oral  SpO2: 94% 93% 94% 93%  Weight:      Height:      Well-appearing overweight woman laying in bed in no distress.  Brighter affect, clearly feels better.  Lungs clear bilaterally.  No crackles.  Heart regular without a murmur.  Abdomen obese, nondistended with positive bowel sounds.  No significant edema.  CBC Latest Ref Rng & Units 03/11/2021 03/10/2021 03/09/2021  WBC 4.0 - 10.5 K/uL 9.4 14.3(H) 8.8  Hemoglobin 12.0 - 15.0 g/dL 13.6 14.8 14.9  Hematocrit 36.0 - 46.0 % 41.2 43.6 44.4  Platelets 150 - 400 K/uL 186 224 200   BMP Latest Ref Rng & Units 03/11/2021 03/10/2021 03/09/2021  Glucose 70 - 99 mg/dL 102(H) 159(H) 167(H)  BUN 8 - 23 mg/dL 8 13 9   Creatinine 0.44 - 1.00 mg/dL 0.63 0.72 0.63  BUN/Creat Ratio 12 - 28 - - -  Sodium 135 - 145 mmol/L 138 135 138  Potassium 3.5 - 5.1 mmol/L 3.4(L) 3.0(L) 3.0(L)  Chloride 98 - 111 mmol/L 102 101 101  CO2 22 - 32 mmol/L 29 27 27   Calcium 8.9 - 10.3 mg/dL 8.5(L) 8.8(L) 9.1    A/P: 1.  Nausea and vomiting, question postoperative due to anesthesia.  Consider also viral, other causes such as gastroparesis given her history of diabetes.  She is improved 5/11 -Plan to advance diet this morning and ensure that she tolerates -Hold off on prokinetic right now -Zofran as needed -Avoid any potential exacerbating medications such as narcotics -Mobilize  2.  Pneumothorax on the right post bronchoscopy 5/9 for enlarging right upper lobe groundglass nodule.  Pigtail catheter removed on 5/10.  No current complaints -Repeat chest  x-ray today. She currently has an office visit with me on 5/13.  If her chest x-ray today looks okay then we may be able to cancel that appointment and then review her bronchoscopy results by phone -Continue to follow cytology, pathology results.    3. Hypokalemia, acute on chronic because she has not been able to take her home meds -replace IV this am -Restart her usual regimen  4. DM. Will get her back on her usual insulin once sure that she is taking better PO  5. Dispo >> hopefully home today if the issues above resolved.    Baltazar Apo, MD, PhD 03/11/2021, 8:45 AM [facility] [phone] or if no answer before 7:00PM call [phone] For any issues after 7:00PM please call eLink [phone]

## 2021-03-11 NOTE — Discharge Summary (Addendum)
Physician Discharge Summary       Patient ID: Sonya Walton MRN: [id number] DOB/AGE: 1961-05-14 60 y.o.  Admit date: 03/09/2021 Discharge date: 03/11/2021  Discharge Diagnoses:  Principal Problem:   Pulmonary nodule 1 cm or greater in diameter Active Problems:   Pneumothorax after biopsy   Nausea and vomiting   History of Present Illness: 60 year old former smoker with diabetes, allergic rhinitis, hypertension who participates in lung cancer screening program.  She had an enlarging ill-defined hazy right upper lobe pulmonary nodule on most recent screening CT 12/09/2020.  This was not hypermetabolic by PET scan 6/62/9476.  She has a family history of lung cancer in her brother.  There was no mediastinal or hilar adenopathy.  Recommendation made to pursue a tissue diagnosis via navigational bronchoscopy.   She had a preoperative cardiac work-up.  She underwent echocardiogram 02/26/2021, LVEF 55-60% with normal diastolic function, normal RV size and function.   Coronary CT with a calcium score of 12 (99th percentile).  She has been started on a statin   She denies any new issues, new problems.  She has had some increased congestion, some hoarse voice over the last week.  Presented 5/9 for navigational bronchoscopy.   Hospital Course:   Navigational bronchoscopy was complicated by pneumothorax. Pigtail chest tube was placed to 20cm H2O suction which was transitioned to waterseal on 5/10 as there was resolution of pneumothorax on chest X ray. Repeat CXR that afternoon with no recurrence and chest tube was discontinued in the afternoon hours of 5/10. Patient developed nausea felt to possibly be secondary to opioids and was also hypokalemic after not taking her potassium supplement. For these reasons she remained in the hospital overnight again. On 5/11 she had no recurrence of her symptoms and was deemed a candidate for discharge.   Discharge Plan by active problems   Small right  pneumothorax following navigational bronchoscopy to evaluate right upper lobe pulmonary nodule.  Chest tube discontinued 5/10. - DC to home. No need for pulmonary clinic follow up. Dr. Lamonte Sakai will contact you with biopsy results.    Nausea with emesis.  Unclear cause, question whether it is been related to discomfort, narcotic use to control pain with chest tube - PRN zofran   Hypertension - Continue amlodipine, atenolol-chlorthalidone per home regimen   Diabetes - resume home regimen   Hyperlipidemia - Crestor   Chronic rhinitis - Resume zyrtec     Significant Hospital tests/ studies   CXR 5/11 > No pneumothorax following RIGHT thoracostomy tube removal.  Consults   Discharge Exam: BP 130/64 (BP Location: Right Arm)   Pulse 70   Temp 98.4 F (36.9 C) (Oral)   Resp 16   Ht $R'5\' 3"'Mp$  (1.6 m)   Wt 98.4 kg   SpO2 95%   BMI 38.44 kg/m   General: Adult female in no distress Neuro:  Alert, oriented, non-focal HEENT:  [place]/AT, No JVD noted, PERRL Cardiovascular:  RRR, no MRG Lungs:  Clear bilateral breath sounds Abdomen:  Soft, non-distended, non-tender.  Musculoskeletal:  No acute deformity Skin:  Intact, MMM    Labs at discharge Lab Results  Component Value Date   CREATININE 0.63 03/11/2021   BUN 8 03/11/2021   NA 138 03/11/2021   K 3.4 (L) 03/11/2021   CL 102 03/11/2021   CO2 29 03/11/2021   Lab Results  Component Value Date   WBC 9.4 03/11/2021   HGB 13.6 03/11/2021   HCT 41.2 03/11/2021   MCV 90.2 03/11/2021  PLT 186 03/11/2021   Lab Results  Component Value Date   ALT 49 (H) 02/26/2021   AST 61 (H) 07/23/2019   ALKPHOS 141 (H) 07/23/2019   BILITOT 0.7 07/23/2019   Lab Results  Component Value Date   INR 1.2 (H) 01/20/2021   INR 0.96 12/31/2011    Current radiology studies DG Chest Port 1 View  Result Date: 03/11/2021 CLINICAL DATA:  Pneumothorax following biopsy EXAM: PORTABLE CHEST 1 VIEW COMPARISON:  Portable exam 0845 hours compared to  03/10/2021 FINDINGS: Interval removal of RIGHT thoracostomy tube. Fiduciary markers again seen in the RIGHT upper lobe with minimal residual lung opacity. No acute infiltrate, pleural effusion, or pneumothorax. Normal heart size and mediastinal contours. Atherosclerotic calcification aorta. Scattered endplate spur formation thoracic spine. IMPRESSION: No pneumothorax following RIGHT thoracostomy tube removal. Electronically Signed   By: Lavonia Dana M.D.   On: 03/11/2021 10:42   DG Chest Port 1 View  Result Date: 03/10/2021 CLINICAL DATA:  Patient suffered a right pneumothorax after biopsy. Chest tube in place. EXAM: PORTABLE CHEST 1 VIEW COMPARISON:  Single view of the chest earlier today. FINDINGS: Right chest tube has backed out or been pulled back slightly. No pneumothorax. Mild bibasilar atelectasis. Fiducial markers in the right upper chest noted. Heart size is normal. Aortic atherosclerosis. IMPRESSION: The patient's right chest tube has backed out slightly or been pulled back. Negative for pneumothorax. No new abnormality. Electronically Signed   By: Inge Rise M.D.   On: 03/10/2021 13:52   DG Chest Port 1 View  Result Date: 03/10/2021 CLINICAL DATA:  60 year old female postoperative day 1 status post bronchoscopic biopsy for right upper lobe lesion. EXAM: PORTABLE CHEST 1 VIEW COMPARISON:  Portable chest 03/09/2021 and earlier. FINDINGS: Portable AP semi upright view at 0758 hours. Stable right chest tube. No pneumothorax identified. Improved lung volumes from yesterday. Small surgical clip just above the right hilum is stable. Mediastinal contours remain within normal limits. No acute pulmonary opacity. No acute osseous abnormality identified. Mild rightward deviation of the trachea at the thoracic inlet related to known thyroid goiter. IMPRESSION: 1. Stable right chest tube. No pneumothorax. 2. No acute cardiopulmonary abnormality. Electronically Signed   By: Genevie Ann M.D.   On: 03/10/2021  08:22   DG Chest Port 1 View  Result Date: 03/09/2021 CLINICAL DATA:  Follow-up pneumothorax EXAM: PORTABLE CHEST 1 VIEW COMPARISON:  03/09/2021, 03/01/2012 FINDINGS: Interval placement of right-sided chest tube with pigtail over the right upper lateral chest. Decreased right apical pneumothorax with trace residual demonstrating less than a cm pleural-parenchymal separation at the apex. Stable cardiomediastinal silhouette with aortic atherosclerosis. IMPRESSION: Interim placement of right-sided chest tube with decreased right apical pneumothorax. There is trace residual right apical pneumothorax Electronically Signed   By: Donavan Foil M.D.   On: 03/09/2021 18:40   DG Chest Port 1 View  Result Date: 03/09/2021 CLINICAL DATA:  Right pneumothorax. EXAM: PORTABLE CHEST 1 VIEW COMPARISON:  03/09/2021 at 5:05 p.m. FINDINGS: Lungs are adequately inflated demonstrate a small right apical pneumothorax slightly worse compared to study earlier today at 12:13 p.m., although unchanged from the recent prior study at 5:05 p.m. Surgical clips over the right upper lung likely from recent bronchoscopy. Stable known nodular opacification over the right upper lobe. Remainder of the lungs are clear. Cardiomediastinal silhouette and remainder of the exam is unchanged. IMPRESSION: 1. Slight interval worsening known small right apical pneumothorax. 2. Stable known nodular opacification over the right upper lobe with postsurgical change right  upper lung. Electronically Signed   By: Marin Olp M.D.   On: 03/09/2021 17:42    Disposition:  Discharge disposition: 01-Home or Self Care     Discharge to home   Discharge Instructions     Call MD for:  difficulty breathing, headache or visual disturbances   Complete by: As directed    Call MD for:  persistant nausea and vomiting   Complete by: As directed    Diet - low sodium heart healthy   Complete by: As directed    Increase activity slowly   Complete by: As  directed    No wound care   Complete by: As directed       Allergies as of 03/11/2021       Reactions   Percocet [oxycodone-acetaminophen] Other (See Comments)   Dysphoria        Medication List     STOP taking these medications    metoprolol tartrate 100 MG tablet Commonly known as: LOPRESSOR       TAKE these medications    acetaminophen 500 MG tablet Commonly known as: TYLENOL Take 1,000 mg by mouth every 6 (six) hours as needed for mild pain or moderate pain.   atenolol-chlorthalidone 50-25 MG tablet Commonly known as: TENORETIC TAKE 1 TABLET BY MOUTH EVERY DAY What changed:  how much to take how to take this when to take this additional instructions   Basaglar KwikPen 100 UNIT/ML Inject 16 Units into the skin daily.   blood glucose meter kit and supplies Kit Inject 1 each into the skin as directed. Use meter once daily to measure blood glucose.   cetirizine 10 MG tablet Commonly known as: ZYRTEC Chew 10 mg by mouth daily.   [employer] Advanced Glucose Test test strip Generic drug: glucose blood Please specify directions, refills and quantity\ Once daily, HWK:[id number], 3 refills, 100 quantity Dr Lattie Haw MD   Dulaglutide 3 MG/0.5ML Sopn Inject 3 mg into the skin every Thursday.   nitroGLYCERIN 0.4 MG SL tablet Commonly known as: Nitrostat Place 1 tablet (0.4 mg total) under the tongue every 5 (five) minutes as needed for chest pain.   ondansetron 4 MG tablet Commonly known as: Zofran Take 1 tablet (4 mg total) by mouth daily as needed for nausea or vomiting.   Pen Needles 32G X 6 MM Misc 1 each by Does not apply route daily.   potassium chloride SA 20 MEQ tablet Commonly known as: KLOR-CON Take 2 tablets (40 mEq total) by mouth daily.   rosuvastatin 10 MG tablet Commonly known as: CRESTOR Take 1 tablet (10 mg total) by mouth daily.   Vitamin D3 50 MCG (2000 UT) Tabs Take 2,000 Units by mouth every morning.        Follow-up  Information     Collene Gobble, MD On 03/13/2021.   Specialty: Pulmonary Disease Why: 10:00 am  Contact information: [address] [place] [address] [phone]                 Discharged Condition: good  38 minutes of time have been dedicated to discharge assessment, planning and discharge instructions.   Signed:  Georgann Housekeeper, AGACNP-BC [facility]  See Amion for personal pager PCCM on call pager [phone] until 7pm. Please call Elink 7p-7a. [phone]  03/11/2021 4:07 PM

## 2021-03-13 ENCOUNTER — Telehealth: Payer: Self-pay | Admitting: Emergency Medicine

## 2021-03-13 ENCOUNTER — Ambulatory Visit: Payer: PRIVATE HEALTH INSURANCE | Admitting: Emergency Medicine

## 2021-03-13 NOTE — Telephone Encounter (Signed)
Called and spoke with pt who is requesting to know if we have any results for her on recent bronch/biopsy. Dr. Lamonte Sakai, please advise.

## 2021-03-13 NOTE — Telephone Encounter (Signed)
Reviewed biopsy results with the patient - shows atypical cells. All cx data still pending  I would like to see her in about 6 weeks to review the culture information and plan the timing of her next

## 2021-03-14 LAB — AEROBIC/ANAEROBIC CULTURE W GRAM STAIN (SURGICAL/DEEP WOUND): Culture: NORMAL

## 2021-03-17 NOTE — Telephone Encounter (Signed)
Called and spoke to pt. Informed her of the results and recs per Dr. Lamonte Sakai. Appt scheduled with Dr. Lamonte Sakai for 04/28/21. Pt verbalized understanding and denied any further questions or concerns at this time.

## 2021-04-07 LAB — FUNGUS CULTURE RESULT

## 2021-04-07 LAB — FUNGUS CULTURE WITH STAIN

## 2021-04-07 LAB — FUNGAL ORGANISM REFLEX

## 2021-04-20 ENCOUNTER — Other Ambulatory Visit: Payer: Self-pay | Admitting: Family Medicine

## 2021-04-22 LAB — ACID FAST CULTURE WITH REFLEXED SENSITIVITIES (MYCOBACTERIA): Acid Fast Culture: NEGATIVE

## 2021-04-28 ENCOUNTER — Ambulatory Visit: Payer: PRIVATE HEALTH INSURANCE | Admitting: Emergency Medicine

## 2021-06-03 ENCOUNTER — Other Ambulatory Visit: Payer: Self-pay

## 2021-06-03 ENCOUNTER — Ambulatory Visit (INDEPENDENT_AMBULATORY_CARE_PROVIDER_SITE_OTHER): Payer: PRIVATE HEALTH INSURANCE | Admitting: Emergency Medicine

## 2021-06-03 ENCOUNTER — Encounter: Payer: Self-pay | Admitting: Emergency Medicine

## 2021-06-03 VITALS — BP 118/70 | HR 77 | Ht 63.0 in | Wt 218.2 lb

## 2021-06-03 DIAGNOSIS — R918 Other nonspecific abnormal finding of lung field: Secondary | ICD-10-CM | POA: Diagnosis not present

## 2021-06-03 NOTE — Progress Notes (Signed)
Subjective:    Patient ID: Sonya Walton, female    DOB: [birth year], 60 y.o.   MRN: [id number]  HPI 60 year old former smoker (37 pack years) with a history of diabetes, allergic rhinitis, hypertension, osteoarthritis. She is referred today for evaluation of an abnormal CT scan of the chest. She participates in the lung cancer screening program and had her most recent screening scan on 12/09/2020.  This was a 96-month follow-up scan from 09/05/2020 which had identified an ill-defined right upper lobe peripheral 2.6 x 1.5 cm density opacity.  As below the lesion of interest is increased in size.  A PET scan is pending tomorrow 12/23/2020.   She has a good functional capacity. She does get a little winded when bringing her trash uphill. Otherwise she isn't limited. No cough or wheeze. She does have chronic hoarse voice.   CT chest 12/09/2020 reviewed by me, shows mild emphysematous change, enlargement of an irregular subsolid apical right upper lobe 3.1 x 2.0 cm mass lesion.  I do not see any mediastinal adenopathy.   Pulmonary function testing 12/18/2020 reviewed by me, shows moderately severe mixed obstruction and restriction by spirometry without a bronchodilator response, FEV1 1.64 L, lung volumes falling to the normal range.  Diffusion capacity is elevated.  ROV 01/20/21 --follow-up visit 60 year old former smoker with COPD based on pulmonary function testing.  She has diabetes, rhinitis, hypertension.  I saw her for a right upper lobe peripheral pulmonary nodule increasing in size on lung cancer screening CT.  Most recent scan 12/09/2020 showed it to be 3.1 x 2.0 cm without any mediastinal adenopathy. She is feeling well.  Her brother had lung CA.   PET scan performed on 01/05/2021 reviewed by me, shows that the groundglass nodule in question does not show any hypermetabolism.  There are no hypermetabolic mediastinal hilar or axillary lymph nodes.  ROV 06/03/21 --Ms. Lillo is 71, former smoker with  COPD, diabetes, allergic rhinitis, hypertension.  I saw her initially for an irregular subsolid apical right upper lobe mass with associated mediastinal adenopathy.  She underwent bronchoscopy 03/09/2021 and transbronchial biopsies showed atypical cells with concern for malignancy but nondiagnostic.  Cultures reviewed today show negative fungal, negative AFB, negative bacterial cultures. She reports feeling well. No CP, no SOB.    Review of Systems As per HPI     Objective:   Physical Exam  Vitals:   06/03/21 1130  BP: 118/70  Pulse: 77  SpO2: 96%  Weight: 218 lb 3.2 oz (99 kg)  Height: 5\' 3"  (1.6 m)   Gen: Pleasant, obese woman, in no distress,  normal affect  ENT: No lesions,  mouth clear,  oropharynx clear, no postnasal drip, hoarse voice  Neck: No JVD, no stridor  Lungs: No use of accessory muscles, no crackles or wheezing on normal respiration, no wheeze on forced expiration  Cardiovascular: RRR, heart sounds normal, no murmur or gallops, no peripheral edema  Musculoskeletal: No deformities, no cyanosis or clubbing  Neuro: alert, awake, non focal  Skin: Warm, no lesions or rash      Assessment & Plan:  Mass of right lung Hazy right upper lobe mixed density nodule/mass.  There were atypical cells on her transbronchial biopsies done on 03/09/2021.  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  I placed fiducial markers during the bronchoscopy to facilitate SBRT if indicated.  We will repeat her CT scan of the chest now.  If the nodule shows any evidence for growth, increased solid component, then I  believe we should refer her to radiation oncology to consider SBRT.  Given the atypical cells seen on her transbronchial biopsies I suspect that we can justify SBRT without repeat bronchoscopy.  We will repeat your CT scan of the chest now to follow your right upper lobe nodule Depending on your CT scan results we will decide whether to refer you to meet Radiation  Oncology Follow Dr. Lamonte Sakai next available after CT scan so that we can review the results together.  Baltazar Apo, MD, PhD 06/03/2021, 12:01 PM [facility] [phone] or if no answer before 7:00PM call [phone] For any issues after 7:00PM please call eLink [phone]

## 2021-06-03 NOTE — Assessment & Plan Note (Signed)
Hazy right upper lobe mixed density nodule/mass.  There were atypical cells on her transbronchial biopsies done on 03/09/2021.  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  I placed fiducial markers during the bronchoscopy to facilitate SBRT if indicated.  We will repeat her CT scan of the chest now.  If the nodule shows any evidence for growth, increased solid component, then I believe we should refer her to radiation oncology to consider SBRT.  Given the atypical cells seen on her transbronchial biopsies I suspect that we can justify SBRT without repeat bronchoscopy.  We will repeat your CT scan of the chest now to follow your right upper lobe nodule Depending on your CT scan results we will decide whether to refer you to meet Radiation Oncology Follow Dr. Lamonte Sakai next available after CT scan so that we can review the results together.

## 2021-06-03 NOTE — Patient Instructions (Signed)
We will repeat your CT scan of the chest now to follow your right upper lobe nodule Depending on your CT scan results we will decide whether to refer you to meet Radiation Oncology Follow Dr. Lamonte Sakai next available after CT scan so that we can review the results together.

## 2021-06-04 ENCOUNTER — Encounter: Payer: Self-pay | Admitting: Family Medicine

## 2021-06-04 ENCOUNTER — Other Ambulatory Visit: Payer: Self-pay | Admitting: Family Medicine

## 2021-06-04 ENCOUNTER — Other Ambulatory Visit: Payer: Self-pay

## 2021-06-04 ENCOUNTER — Ambulatory Visit (INDEPENDENT_AMBULATORY_CARE_PROVIDER_SITE_OTHER): Payer: 59 | Admitting: Family Medicine

## 2021-06-04 VITALS — BP 128/77 | HR 79 | Wt 217.8 lb

## 2021-06-04 DIAGNOSIS — G479 Sleep disorder, unspecified: Secondary | ICD-10-CM | POA: Diagnosis not present

## 2021-06-04 DIAGNOSIS — I10 Essential (primary) hypertension: Secondary | ICD-10-CM

## 2021-06-04 DIAGNOSIS — E1165 Type 2 diabetes mellitus with hyperglycemia: Secondary | ICD-10-CM

## 2021-06-04 DIAGNOSIS — R918 Other nonspecific abnormal finding of lung field: Secondary | ICD-10-CM

## 2021-06-04 DIAGNOSIS — Z1211 Encounter for screening for malignant neoplasm of colon: Secondary | ICD-10-CM

## 2021-06-04 LAB — POCT GLYCOSYLATED HEMOGLOBIN (HGB A1C): HbA1c, POC (controlled diabetic range): 9.2 % — AB (ref 0.0–7.0)

## 2021-06-04 MED ORDER — EMPAGLIFLOZIN 10 MG PO TABS: 10.0000 mg | ORAL_TABLET | Freq: Every day | ORAL | 0 refills | Status: AC

## 2021-06-04 NOTE — Assessment & Plan Note (Addendum)
A1c 9.2, higher than goal.  It is likely that recent stress from lung mass work-up is contributing to higher blood sugar levels.  Patient is currently taking glargine at 16 units and Trulicity 3 mg weekly.  Recommended starting Jardiance today 10 mg, can increase this to 25mg  in the future.  Increase glargine to 25 units.  Offered patient metformin as well however she declined.  We discussed switching her from Trulicity to another GLP-1 agonist in the future as she feels like she has plateaued on Trulicity.  Patient will keep a CBG diary at home and let me know if her blood sugars <120/experiences hypoglycemic symptoms.  Diet and exercise counseling provided.  Repeat A1c in 3 months.  Repeat BMP in 2 weeks, I have booked this lab visit.  Will consider switching Trulicity to Ozempic or new GIP-GLP1 agonist: Tirzepatide if her insurance covers this at next visit.

## 2021-06-04 NOTE — Assessment & Plan Note (Addendum)
Patient denies dyspnea, sats 96% on air, no respiratory distress.  Patient will have repeat chest CT on 13th August following atypical cells found on bronchoscopy in May 22.  If CT scan shows solid tumor or increased growth of lung mass, Dr. Lamonte Sakai will consider sending her for radiation therapy for slow growing adenocarcinoma. Continue follow-up with Dr. Lamonte Sakai.

## 2021-06-04 NOTE — Patient Instructions (Signed)
Thank you for coming to see me today. It was a pleasure. Today we discussed your diabetes. Your a1c is going up so we need to change some of your medications: I recommend:  -Glargine 25 units daily -Starting Jardiance 10mg  daily -Continue trulicity at same dose   Lab visit in 2 weeks to check kidney function  For sleep, try and incorporate good sleep hygiene: Reduce caffeine Meditation Essential oils Melatonin  Avoiding screens before bed Exercise   Please follow-up with me in 3 months.  If you have any questions or concerns, please do not hesitate to call the office at [phone].  Best wishes,   Dr Posey Pronto

## 2021-06-04 NOTE — Assessment & Plan Note (Signed)
Recommended patient follows up with GI for repeat colonoscopy soon.  Her last colonoscopy was last year which showed multiple polyps and they wanted to see her back for 1 year follow-up.

## 2021-06-04 NOTE — Assessment & Plan Note (Signed)
Likely due to stress. We discussed sleep hygiene techniques to help her such as reducing caffeine, decreasing screen time before bed, meditation/breath work, using essential oils, exercise etc. Also recommended over-the-counter melatonin patient seems to have had relief from taking CBD oil.  I explained I cannot comment on the benefit of CBD oil as it is not a medication that we prescribed and there is likely limited evidence behind it.  However she may continue to use this if she finds it beneficial.

## 2021-06-04 NOTE — Assessment & Plan Note (Addendum)
BP 120/77.  At goal continue Tenoretic.

## 2021-06-04 NOTE — Progress Notes (Signed)
SUBJECTIVE:   CHIEF COMPLAINT / HPI:   Sonya Walton is a 60 y.o. female presents for diabetes follow up   Diabetes Patient's current diabetic medications include Glargine 16 units, Trulicity 3mg . Tolerating well without side effects.  Patient endorses compliance with these medications. CBG readings averaging in the 150-258 range.  Patient's last A1c was  Lab Results  Component Value Date   HGBA1C 9.2 (A) 06/04/2021   HGBA1C 8.0 (A) 12/12/2020   HGBA1C 8.9 (A) 07/21/2020   Denies abdominal pain, blurred vision, polyuria, polydipsia, hypoglycemia. Patient states they understand that diet and exercise can help with her diabetes. Last Microalbumin, LDL, Creatinine: Lab Results  Component Value Date   LDLCALC 47 02/26/2021   CREATININE 0.63 03/11/2021    Hypertension Patient's current antihypertensive  medications include: Tenoretic. Compliant with medications and tolerating well without side effects. Denies any SOB, CP, vision changes, LE edema, medication SEs, or symptoms of hypotension.   Most recent creatinine trend:  Lab Results  Component Value Date   CREATININE 0.63 03/11/2021   CREATININE 0.72 03/10/2021   CREATININE 0.63 03/09/2021     Patient has had a BMP in the past 1 year.   Difficulty sleeping Endorses difficulty sleeping for the last few years. Her mind is racing. Has difficulty falling asleep and staying asleep. Awakes every 2 hours. Has taken CBD oil in the last month which has been helping and relaxes her. Has tried herbal teas which does not help. Her aches and pains in her joints have gone away. Drinks zero sugar coke sometimes and 1-2 cups coffee a day. Usually stops watching TV to 1-hr before bedtime.   [place] Office Visit from 06/04/2021 in [place]  PHQ-9 Total Score 2       Right upper lobe mass Denies dyspnea, chest pain palpations, dizziness, orthopnea and PND. Follows Dr Lamonte Sakai as outpatient for upper lobe  mass. 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 Repeated CT scan 06/13/21. If the nodule shows any evidence for growth, increased solid component, then I believe we should refer her to radiation oncology to consider SBRT.  Given the atypical cells seen on her transbronchial biopsies I suspect that we can justify SBRT without repeat bronchoscopy    PERTINENT  PMH / PSH: DM, HTN   OBJECTIVE:   BP 128/77   Pulse 79   Wt 217 lb 12.8 oz (98.8 kg)   SpO2 96%   BMI 38.58 kg/m    General: Alert, no acute distress Cardio:  well perfused  Pulm: normal work of breathing Neuro: Cranial nerves grossly intact   ASSESSMENT/PLAN:   Hypertension BP 120/77.  At goal continue Tenoretic.   Mass of right lung Patient denies dyspnea, sats 96% on air, no respiratory distress.  Patient will have repeat chest CT on 13th August following atypical cells found on bronchoscopy in May 22.  If CT scan shows solid tumor or increased growth of lung mass, Dr. Lamonte Sakai will consider sending her for radiation therapy for slow growing adenocarcinoma. Continue follow-up with Dr. Lamonte Sakai.   Diabetes mellitus with hyperglycemia (HCC) A1c 9.2, higher than goal.  It is likely that recent stress from lung mass work-up is contributing to higher blood sugar levels.  Patient is currently taking glargine at 16 units and Trulicity 3 mg weekly.  Recommended starting Jardiance today 10 mg, can increase this to 25mg  in the future.  Increase glargine to 25 units.  Offered patient metformin as well  however she declined.  We discussed switching her from Trulicity to another GLP-1 agonist in the future as she feels like she has plateaued on Trulicity.  Patient will keep a CBG diary at home and let me know if her blood sugars <120/experiences hypoglycemic symptoms.  Diet and exercise counseling provided.  Repeat A1c in 3 months.  Repeat BMP in 2 weeks, I have booked this lab visit.  Will consider switching Trulicity to  Ozempic or new GIP-GLP1 agonist: Tirzepatide if her insurance covers this at next visit.  Screening for malignant neoplasm of colon Recommended patient follows up with GI for repeat colonoscopy soon.  Her last colonoscopy was last year which showed multiple polyps and they wanted to see her back for 1 year follow-up.  Difficulty sleeping Likely due to stress. We discussed sleep hygiene techniques to help her such as reducing caffeine, decreasing screen time before bed, meditation/breath work, using essential oils, exercise etc. Also recommended over-the-counter melatonin patient seems to have had relief from taking CBD oil.  I explained I cannot comment on the benefit of CBD oil as it is not a medication that we prescribed and there is likely limited evidence behind it.  However she may continue to use this if she finds it beneficial.     Lattie Haw, MD PGY-3 [place]

## 2021-06-05 ENCOUNTER — Telehealth: Payer: Self-pay | Admitting: *Deleted

## 2021-06-05 NOTE — Telephone Encounter (Signed)
Received fax from pts pharmacy that states for Rx Jardiance 10 mg tab-Patient does not have insurance, cost is $598.17, she cannot afford this.Doneisha Ivey Zimmerman Rumple, CMA

## 2021-06-06 NOTE — Telephone Encounter (Signed)
Thanks for letting me know. Do we have samples we can give her in the clinic? Please could you also book patient with Dr Sonya Walton for further management of her diabetes? Thank you

## 2021-06-07 ENCOUNTER — Other Ambulatory Visit: Payer: Self-pay | Admitting: Family Medicine

## 2021-06-08 NOTE — Telephone Encounter (Signed)
Patient is calling to check up on the status of samples for medications. I spoke to Dr. Valentina Lucks and he is going to get a message to [place] to reach out to patient for samples. Thanks!

## 2021-06-10 ENCOUNTER — Telehealth: Payer: Self-pay

## 2021-06-10 ENCOUNTER — Other Ambulatory Visit: Payer: Self-pay | Admitting: Family Medicine

## 2021-06-10 DIAGNOSIS — Z1231 Encounter for screening mammogram for malignant neoplasm of breast: Secondary | ICD-10-CM

## 2021-06-10 NOTE — Telephone Encounter (Signed)
Thank you very much Rosendo Gros! Im glad you were able to get her some samples

## 2021-06-10 NOTE — Telephone Encounter (Signed)
SPOKE TO PT REGARDING JARDIANCE SAMPLES AND PAP ENROLLMENT WITH [facility]. ALSO INFORMED ABOUT RE ENROLLMENT WITH [facility] FOR [place].  PT CAN COME BY [facility] MORNING TO PICKUP SAMPLES AND PAPERWORK. ONCE COMPLETED, PT WILL RETURN ON 06/18/21 APPT.  Medication Samples have been provided to the patient.  Drug name: JARDIANCE       Strength: 10MG         Qty: 21 TABLETS  LOT: [id number]  Exp.Date: 07/2022  Dosing instructions: TAKE 1 TABLET WITH BREAKFAST  Talbert Cage Emmanuela Ghazi 11:53 AM 06/10/2021

## 2021-06-11 NOTE — Telephone Encounter (Signed)
Patient presents to clinic to pick up samples. Provided patient with samples and assistance application.   Talbot Grumbling, RN

## 2021-06-13 ENCOUNTER — Ambulatory Visit
Admission: RE | Admit: 2021-06-13 | Discharge: 2021-06-13 | Disposition: A | Discharge status: Home / Self Care | Payer: No Typology Code available for payment source | Source: Ambulatory Visit | Attending: Emergency Medicine | Admitting: Emergency Medicine

## 2021-06-13 ENCOUNTER — Other Ambulatory Visit: Payer: Self-pay

## 2021-06-13 DIAGNOSIS — R918 Other nonspecific abnormal finding of lung field: Secondary | ICD-10-CM

## 2021-06-16 ENCOUNTER — Ambulatory Visit: Payer: PRIVATE HEALTH INSURANCE | Admitting: Cardiology

## 2021-06-17 NOTE — Telephone Encounter (Signed)
Closing this encounter, it has been handled in another phone encounter.Sonya Walton, CMA

## 2021-06-18 ENCOUNTER — Other Ambulatory Visit: Payer: PRIVATE HEALTH INSURANCE

## 2021-06-18 ENCOUNTER — Other Ambulatory Visit: Payer: Self-pay

## 2021-06-18 DIAGNOSIS — E1165 Type 2 diabetes mellitus with hyperglycemia: Secondary | ICD-10-CM

## 2021-06-19 LAB — BASIC METABOLIC PANEL
BUN/Creatinine Ratio: 13 (ref 12–28)
BUN: 9 mg/dL (ref 8–27)
CO2: 24 mmol/L (ref 20–29)
Calcium: 9.4 mg/dL (ref 8.7–10.3)
Chloride: 99 mmol/L (ref 96–106)
Creatinine, Ser: 0.72 mg/dL (ref 0.57–1.00)
Glucose: 131 mg/dL — ABNORMAL HIGH (ref 65–99)
Potassium: 3.5 mmol/L (ref 3.5–5.2)
Sodium: 139 mmol/L (ref 134–144)
eGFR: 95 mL/min/{1.73_m2} (ref >59)

## 2021-06-26 ENCOUNTER — Telehealth: Payer: Self-pay | Admitting: *Deleted

## 2021-06-26 ENCOUNTER — Other Ambulatory Visit: Payer: Self-pay

## 2021-06-26 ENCOUNTER — Other Ambulatory Visit (INDEPENDENT_AMBULATORY_CARE_PROVIDER_SITE_OTHER): Payer: PRIVATE HEALTH INSURANCE

## 2021-06-26 ENCOUNTER — Ambulatory Visit (INDEPENDENT_AMBULATORY_CARE_PROVIDER_SITE_OTHER): Payer: 59 | Admitting: Gastroenterology

## 2021-06-26 ENCOUNTER — Encounter: Payer: Self-pay | Admitting: Gastroenterology

## 2021-06-26 VITALS — BP 120/78 | HR 70 | Ht 63.0 in | Wt 215.1 lb

## 2021-06-26 DIAGNOSIS — R918 Other nonspecific abnormal finding of lung field: Secondary | ICD-10-CM

## 2021-06-26 DIAGNOSIS — R1012 Left upper quadrant pain: Secondary | ICD-10-CM

## 2021-06-26 DIAGNOSIS — Z86010 Personal history of colonic polyps: Secondary | ICD-10-CM

## 2021-06-26 DIAGNOSIS — R9389 Abnormal findings on diagnostic imaging of other specified body structures: Secondary | ICD-10-CM

## 2021-06-26 LAB — COMPREHENSIVE METABOLIC PANEL
ALT: 43 U/L — ABNORMAL HIGH (ref 0–35)
AST: 37 U/L (ref 0–37)
Albumin: 4 g/dL (ref 3.5–5.2)
Alkaline Phosphatase: 108 U/L (ref 39–117)
BUN: 11 mg/dL (ref 6–23)
CO2: 29 mEq/L (ref 19–32)
Calcium: 9.7 mg/dL (ref 8.4–10.5)
Chloride: 102 mEq/L (ref 96–112)
Creatinine, Ser: 0.73 mg/dL (ref 0.40–1.20)
GFR: 88.74 mL/min (ref >60.00)
Glucose, Bld: 106 mg/dL — ABNORMAL HIGH (ref 70–99)
Potassium: 3.3 mEq/L — ABNORMAL LOW (ref 3.5–5.1)
Sodium: 140 mEq/L (ref 135–145)
Total Bilirubin: 0.7 mg/dL (ref 0.2–1.2)
Total Protein: 7.3 g/dL (ref 6.0–8.3)

## 2021-06-26 LAB — PROTIME-INR
INR: 1.2 ratio — ABNORMAL HIGH (ref 0.8–1.0)
Prothrombin Time: 12.6 s (ref 9.6–13.1)

## 2021-06-26 NOTE — Progress Notes (Signed)
PROVIDED SAMPLES WHILE PAP APPLICATION STILL PROCESSES.   Medication Samples have been provided to the patient.  Drug name: JARDIANCE       Strength: 10MG         Qty: 21  LOT: [id number]  Exp.Date: 04/01/2023  Dosing instructions: TAKE 1 BEFORE BREAKFAST   Talbert Cage Natividad Halls 11:52 AM 06/26/2021

## 2021-06-26 NOTE — Telephone Encounter (Signed)
Received call from CMA(Brooke) in [place]. Pt had labs in our office today and test FIB-4 w/reflex was not drawn.  Contacted pt and apologized for my oversight. Pt lives in [place] and [place] service center would be closer for her. Scheduled appt for pt via [facility] website for Monday, 8/29 at 9:45am on [address] in [place]. Tried calling to verify fax # but only received voicemail @ [place]. Order faxed to [facility] @ [phone] per website.

## 2021-06-26 NOTE — Progress Notes (Signed)
Chief Complaint:    LUQ pain, discuss colonoscopy   HPI:     Patient is a 60 y.o. female with a history of diabetes, lung CA, tobacco use disorder, COPD, allergic rhinitis, HTN, osteoarthritis, presenting to the Gastroenterology Clinic for follow-up.   Main issue today is episodic LUQ pain for last 3 months or so.  Has had 3 episodes, each lasting a couple mins. Described as sharp pain.  No associated change in bowel habits, nausea/vomiting, fever, hematochezia or melena. Not related to PO, activity, position, etc. Last episode was 1 week ago.   Separately, I recommended repeat colonoscopy this year, but she states her insurance is not covering.  Colonoscopy in 05/2020 with 10 subcentimeter tubular adenoma and sessile serrated polyps.  Was recommended for repeat colonoscopy in 1 year based on GI societal guidelines.  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  No mediastinal lymphadenopathy.  Follows with Dr. Lamonte Sakai in the Pulmonary Clinic.  Considering SBRT vs surveillance; has f/u on 07/10/21.  No recent specific abdominal imaging for review, but CT chest with morphologic changes c/f hepatic cirrhosis.  Last set of liver enzymes was 07/2019: AST/ALT 61/73, ALP 141, T bili 0.7, albumin 4.1.  Since then INR 1.2 and normal PLT and normal renal function.  Endoscopic History: - Colonoscopy (2008): Single polyp per patient.  No report available for review - Colonoscopy (05/2020): Tortuous colon.  10 subcentimeter tubular adenomas and sessile serrated polyps.  Multiple rectal and rectosigmoid hyperplastic polyps.  Sigmoid diverticulosis, nodular ICV (path: Benign), Ascending colon AVM.  Normal TI.  Recommended repeat in 1 year    Review of systems:     No chest pain, no SOB, no fevers, no urinary sx   Past Medical History:  Diagnosis Date   Allergy    Arthritis    Diabetes mellitus without complication ([place])    Hoarse voice  quality 01/19/2016   Hypertension    states under control with med., has been on med. since 2011   Immature cataract    Osteoarthritis 12/2015   right AC joint   Partial tear of right rotator cuff 12/2015   Wears dentures    upper    Patient's surgical history, family medical history, social history, medications and allergies were all reviewed in Epic    Current Outpatient Medications  Medication Sig Dispense Refill   acetaminophen (TYLENOL) 500 MG tablet Take 1,000 mg by mouth every 6 (six) hours as needed for mild pain or moderate pain.     atenolol-chlorthalidone (TENORETIC) 50-25 MG tablet TAKE 1 TABLET BY MOUTH EVERY DAY (Patient taking differently: Take 1 tablet by mouth daily.) 90 tablet 3   blood glucose meter kit and supplies KIT Inject 1 each into the skin as directed. Use meter once daily to measure blood glucose. 1 each 0   cetirizine (ZYRTEC) 10 MG tablet Chew 10 mg by mouth daily.     Cholecalciferol (VITAMIN D3) 2000 UNITS TABS Take 2,000 Units by mouth every morning.     Dulaglutide 3 MG/0.5ML SOPN Inject 3 mg into the skin every Thursday.     empagliflozin (JARDIANCE) 10 MG TABS tablet Take 1 tablet (10 mg total) by mouth daily before breakfast. 30 tablet 0   glucose blood ([employer] ADVANCED GLUCOSE TEST) test strip Please specify directions, refills and quantity\ Once daily, [id number], 3 refills, 100 quantity Dr Lattie Haw MD 100 each 0   Insulin Glargine [facility])  100 UNIT/ML Inject 16 Units into the skin daily. (Patient taking differently: Inject 25 Units into the skin daily.)     Insulin Pen Needle (PEN NEEDLES) 32G X 6 MM MISC 1 each by Does not apply route daily. 50 each 6   nitroGLYCERIN (NITROSTAT) 0.4 MG SL tablet Place 1 tablet (0.4 mg total) under the tongue every 5 (five) minutes as needed for chest pain. 30 tablet 12   ondansetron (ZOFRAN) 4 MG tablet Take 1 tablet (4 mg total) by mouth daily as needed for nausea or vomiting. 30 tablet 0   potassium  chloride SA (KLOR-CON) 20 MEQ tablet Take 2 tablets (40 mEq total) by mouth daily. 180 tablet 3   RELION PEN NEEDLES 32G X 4 MM MISC USE 1 STRIP TO CHECK GLUCOSE ONCE DAILY 50 each 0   rosuvastatin (CRESTOR) 10 MG tablet Take 1 tablet (10 mg total) by mouth daily. 90 tablet 3   Current Facility-Administered Medications  Medication Dose Route Frequency Provider Last Rate Last Admin   0.9 %  sodium chloride infusion  500 mL Intravenous Once Edu On V, DO        Physical Exam:     BP 120/78   Pulse 70   Ht _0  (1.6 m)   Wt 215 lb 2 oz (97.6 kg)   SpO2 95%   BMI 38.11 kg/m   GENERAL:  Pleasant female in NAD PSYCH: : Cooperative, normal affect CARDIAC:  RRR, no murmur heard, no peripheral edema PULM: Normal respiratory effort, lungs CTA bilaterally, no wheezing ABDOMEN:  Nondistended, soft, nontender. No obvious masses, no hepatomegaly,  normal bowel sounds Musculoskeletal:  Normal muscle tone, normal strength NEURO: Alert and oriented x 3, no focal neurologic deficits   IMPRESSION and PLAN:    1) LUQ pain - Episodic, self-limiting, brief LUQ pain without other associated symptoms.  Suspect gas related pains - Conservative management - Can evaluate for additional pathology at time of abdominal ultrasound as already ordering for liver disease as below - If symptoms persist, consider additional imaging vs endoscopy  2) Abnormal imaging study - CT chest demonstrates morphologic changes concerning for hepatic cirrhosis - Ultrasound to evaluate for cirrhotic appearing liver - Recent PFTs were normal - INR, CMP - Fib-4 to evaluate for hepatic steatosis/NASH - If imaging/labs consistent with cirrhosis, plan for EGD to evaluate for esophageal varices, portal hypertensive gastropathy  3) History of colon polyps - Colonoscopy in 05/2020 with 10 subcentimeter tubular adenomas.  Per guidelines, was recommended have repeat colonoscopy in 1 year.  For unclear reasons, her  insurance has not approved - Will reach out to insurance for approval of colonoscopy for continued surveillance  4) RUL mass - Continue follow-up with Dr. Lamonte Sakai in the Pulmonary Clinic as scheduled - Did discuss the relationship of RUL mass (if indeed adenocarcinoma) and the need for ongoing colonoscopy/polyp surveillance.  She was of the strong opinion to continue with colonoscopy though  The indications, risks, and benefits of EGD and colonoscopy were both explained to the patient in detail. Risks include but are not limited to bleeding, perforation, adverse reaction to medications, and cardiopulmonary compromise. Sequelae include but are not limited to the possibility of surgery, hospitalization, and mortality. The patient verbalized understanding and wished to proceed as outlined above.         Lavena Bullion ,DO, FACG 06/26/2021, 9:59 AM

## 2021-06-26 NOTE — Patient Instructions (Addendum)
If you are age 61 or older, your body mass index should be between 23-30. Your Body mass index is 38.11 kg/m. If this is out of the aforementioned range listed, please consider follow up with your Primary Care Provider.  If you are age 46 or younger, your body mass index should be between 19-25. Your Body mass index is 38.11 kg/m. If this is out of the aformentioned range listed, please consider follow up with your Primary Care Provider.   __________________________________________________________  The [facility] providers would like to encourage you to use [facility] to communicate with providers for non-urgent requests or questions.  Due to long hold times on the telephone, sending your provider a message by [facility] may be a faster and more efficient way to get a response.  Please allow 48 business hours for a response.  Please remember that this is for non-urgent requests.   You have been scheduled for an abdominal ultrasound at [facility] on 07/01/2021 at 10am. Please arrive 15 minutes prior to your appointment for registration. Make certain not to have anything to eat or drink 6 hours prior to your appointment. Should you need to reschedule your appointment, please contact radiology at [phone]. This test typically takes about 30 minutes to perform.  Please go to the lab on the 2nd floor [address] before you leave the office today.   You have been scheduled for an endoscopy and colonoscopy. Please follow the written instructions given to you at your visit today. Please pick up your prep supplies at the pharmacy within the next 1-3 days. If you use inhalers (even only as needed), please bring them with you on the day of your procedure.  It was a pleasure to see you today!  Vito Cirigliano, D.O.

## 2021-06-28 NOTE — Progress Notes (Signed)
Thank you Rosendo Gros!

## 2021-06-29 ENCOUNTER — Other Ambulatory Visit: Payer: Self-pay | Admitting: Gastroenterology

## 2021-06-30 NOTE — Telephone Encounter (Signed)
Patient stated she went yesterday for lab work. Will follow up regarding results

## 2021-07-01 ENCOUNTER — Other Ambulatory Visit: Payer: Self-pay

## 2021-07-01 ENCOUNTER — Ambulatory Visit (HOSPITAL_BASED_OUTPATIENT_CLINIC_OR_DEPARTMENT_OTHER)
Admission: RE | Admit: 2021-07-01 | Discharge: 2021-07-01 | Disposition: A | Discharge status: Home / Self Care | Payer: PRIVATE HEALTH INSURANCE | Source: Ambulatory Visit | Attending: Gastroenterology | Admitting: Gastroenterology

## 2021-07-01 ENCOUNTER — Telehealth: Payer: Self-pay | Admitting: Cardiology

## 2021-07-01 DIAGNOSIS — R9389 Abnormal findings on diagnostic imaging of other specified body structures: Secondary | ICD-10-CM | POA: Diagnosis present

## 2021-07-01 DIAGNOSIS — Z86010 Personal history of colonic polyps: Secondary | ICD-10-CM | POA: Insufficient documentation

## 2021-07-01 DIAGNOSIS — R1012 Left upper quadrant pain: Secondary | ICD-10-CM | POA: Diagnosis present

## 2021-07-01 NOTE — Telephone Encounter (Signed)
Patient called in to see if the appt on Friday is necessary to have. If not she wants to cancel. Please advise

## 2021-07-01 NOTE — Telephone Encounter (Signed)
Pt is calling to ask what her appt with Laurann Montana NP is for on this Friday 9/2.  Informed the pt that this is her 6 month follow-up appt that Dr. Johney Frame wanted her to have,  from 01/27/21 OV.  Pt was to see Dr. Johney Frame for follow-up on 8/16, but this was cancelled due to Dr. Johney Frame going out on maternity leave early.  Pt was then rescheduled to see [facility] on 9/2, due to unexpected cancellation. Advised the pt to keep her appt with [facility] on Friday, for continuity of care, and to refill her cardiac meds accordingly.  Informed the pt that more than likely if she is doing well at this visit, she could possibly request to be seen in one year, based on Caitlin's assessment and plan. Pt verbalized understanding and agrees with this plan.  Pt states she will keep this appt as scheduled.  Pt appreciative for all the assistance provided.

## 2021-07-03 ENCOUNTER — Ambulatory Visit (INDEPENDENT_AMBULATORY_CARE_PROVIDER_SITE_OTHER): Payer: 59 | Admitting: Family

## 2021-07-03 ENCOUNTER — Telehealth: Payer: Self-pay | Admitting: Gastroenterology

## 2021-07-03 ENCOUNTER — Other Ambulatory Visit: Payer: Self-pay

## 2021-07-03 ENCOUNTER — Encounter (HOSPITAL_BASED_OUTPATIENT_CLINIC_OR_DEPARTMENT_OTHER): Payer: Self-pay | Admitting: Family

## 2021-07-03 VITALS — BP 110/66 | HR 72 | Ht 63.0 in | Wt 214.4 lb

## 2021-07-03 DIAGNOSIS — E785 Hyperlipidemia, unspecified: Secondary | ICD-10-CM

## 2021-07-03 DIAGNOSIS — E119 Type 2 diabetes mellitus without complications: Secondary | ICD-10-CM | POA: Diagnosis not present

## 2021-07-03 DIAGNOSIS — I25118 Atherosclerotic heart disease of native coronary artery with other forms of angina pectoris: Secondary | ICD-10-CM

## 2021-07-03 DIAGNOSIS — I10 Essential (primary) hypertension: Secondary | ICD-10-CM

## 2021-07-03 DIAGNOSIS — Z794 Long term (current) use of insulin: Secondary | ICD-10-CM

## 2021-07-03 MED ORDER — ASPIRIN EC 81 MG PO TBEC: 81.0000 mg | DELAYED_RELEASE_TABLET | Freq: Every day | ORAL | 3 refills | Status: AC

## 2021-07-03 NOTE — Progress Notes (Signed)
Office Visit    Patient Name: Sonya Walton Date of Encounter: 07/03/2021  PCP:  Towanda Octave, MD   [facility]  Cardiologist:  Meriam Sprague, MD  Advanced Practice Provider:  No care team member to display Electrophysiologist:  None    Chief Complaint    Sonya Walton is a 60 y.o. female with a hx of DM2, HTN, OA, CAD, former tobacco use presents today for follow up   Past Medical History    Past Medical History:  Diagnosis Date   Allergy    Arthritis    Diabetes mellitus without complication ([facility])    Hoarse voice quality 01/19/2016   Hypertension    states under control with med., has been on med. since 2011   Immature cataract    Osteoarthritis 12/2015   right AC joint   Partial tear of right rotator cuff 12/2015   Wears dentures    upper   Past Surgical History:  Procedure Laterality Date   BREAST BIOPSY Left    BRONCHIAL BIOPSY  03/09/2021   Procedure: BRONCHIAL BIOPSIES;  Surgeon: Leslye Peer, MD;  Location: [facility] ENDOSCOPY;  Service: Pulmonary;;   BRONCHIAL BRUSHINGS  03/09/2021   Procedure: BRONCHIAL BRUSHINGS;  Surgeon: Leslye Peer, MD;  Location: [facility] ENDOSCOPY;  Service: Pulmonary;;   BRONCHIAL NEEDLE ASPIRATION BIOPSY  03/09/2021   Procedure: BRONCHIAL NEEDLE ASPIRATION BIOPSIES;  Surgeon: Leslye Peer, MD;  Location: [facility] ENDOSCOPY;  Service: Pulmonary;;   BUNIONECTOMY Bilateral 1994   CHOLECYSTECTOMY  01/17/2012   Procedure: LAPAROSCOPIC CHOLECYSTECTOMY;  Surgeon: Shelly Rubenstein, MD;  Location: [facility] OR;  Service: General;  Laterality: N/A;   FIDUCIAL MARKER PLACEMENT  03/09/2021   Procedure: FIDUCIAL MARKER PLACEMENT;  Surgeon: Leslye Peer, MD;  Location[facility] ENDOSCOPY;  Service: Pulmonary;;   PUBOVAGINAL SLING  03/05/2003   SHOULDER ARTHROSCOPY WITH DISTAL CLAVICLE RESECTION Right 01/26/2016   Procedure: SHOULDER ARTHROSCOPY WITH DISTAL CLAVICLE RESECTION;  Surgeon: Jones Broom, MD;  Location: [facility];  Service: Orthopedics;  Laterality: Right;   SHOULDER ARTHROSCOPY WITH SUBACROMIAL DECOMPRESSION Right 01/26/2016   Procedure: SHOULDER ARTHROSCOPY WITH SUBACROMIAL DECOMPRESSION DEBRIDEMENT;  Surgeon: Jones Broom, MD;  Location: [facility];  Service: Orthopedics;  Laterality: Right;   TONSILLECTOMY  1975   TOTAL VAGINAL HYSTERECTOMY  03/05/2003   TUBAL LIGATION  1981   VAGINAL HYSTERECTOMY  2004   partial    VIDEO BRONCHOSCOPY WITH ENDOBRONCHIAL NAVIGATION N/A 03/09/2021   Procedure: VIDEO BRONCHOSCOPY WITH ENDOBRONCHIAL NAVIGATION;  Surgeon: Leslye Peer, MD;  Location: [facility] ENDOSCOPY;  Service: Pulmonary;  Laterality: N/A;    Allergies  Allergies  Allergen Reactions   Percocet [Oxycodone-Acetaminophen] Other (See Comments)    Dysphoria    History of Present Illness    Sonya Walton is a 60 y.o. female with a hx of DM2, HTN, OA, tobacco use, CAD last seen 01/27/21 by Dr. Shari Prows.  Evaluated 12/2020 by Dr. Shari Prows due to chest squeezing. CTA performed 01/2021 with mild nonobstructive CAD. Echo 02/26/21 LVEF 55-60%, no RWMA, LV mildly dilated, normal diastolic parameters, normal PASP, LA mildly dilated, trivial MR.  She presents today for follow up. We reviewed her echo and CTA again. Reports no shortness of breath nor dyspnea on exertion. Reports no chest pain, pressure, or tightness. No edema, orthopnea, PND. Reports no palpitations.  She has been working to increase her physical activity and endorses following a heart healthy diet.   EKGs/Labs/Other Studies  Reviewed:   The following studies were reviewed today:  Cardiac CTA 01/2021   IMPRESSION: 1. Mild nonobstructive CAD, CADRADS = 2. Highest concentration of plaque noted in the transition from the distal left main into the bifurcation of the LAD/LCx, without significant obstruction.   2. Coronary calcium score of 12. This was 99th percentile for age and sex matched control.   3.  Normal coronary  origin with right dominance.   4.  Aortic atherosclerosis  Echo 02/26/21  1. Left ventricular ejection fraction, by estimation, is 55 to 60%. The  left ventricle has normal function. The left ventricle has no regional  wall motion abnormalities. The left ventricular internal cavity size was  mildly dilated. Left ventricular  diastolic parameters were normal.   2. Right ventricular systolic function is normal. The right ventricular  size is normal. There is normal pulmonary artery systolic pressure.   3. Left atrial size was mildly dilated.   4. The mitral valve is normal in structure. Trivial mitral valve  regurgitation. No evidence of mitral stenosis.   5. The aortic valve is tricuspid. Aortic valve regurgitation is not  visualized. No aortic stenosis is present.   EKG:  No EKG today  Recent Labs: 03/10/2021: Magnesium 2.0 03/11/2021: Hemoglobin 13.6; Platelets 186 06/26/2021: ALT 43; BUN 11; Creatinine, Ser 0.73; Potassium 3.3; Sodium 140  Recent Lipid Panel    Component Value Date/Time   CHOL 102 02/26/2021 0820   TRIG 74 02/26/2021 0820   TRIG 192 10/05/2012 1817   HDL 40 02/26/2021 0820   CHOLHDL 2.6 02/26/2021 0820   CHOLHDL 3.6 04/12/2016 0953   VLDL 24 04/12/2016 0953   LDLCALC 47 02/26/2021 0820   LDLCALC 95 10/05/2012 1817   Home Medications   Current Meds  Medication Sig   acetaminophen (TYLENOL) 500 MG tablet Take 1,000 mg by mouth every 6 (six) hours as needed for mild pain or moderate pain.   atenolol-chlorthalidone (TENORETIC) 50-25 MG tablet TAKE 1 TABLET BY MOUTH EVERY DAY (Patient taking differently: Take 1 tablet by mouth daily.)   blood glucose meter kit and supplies KIT Inject 1 each into the skin as directed. Use meter once daily to measure blood glucose.   cetirizine (ZYRTEC) 10 MG tablet Chew 10 mg by mouth daily.   Cholecalciferol (VITAMIN D3) 2000 UNITS TABS Take 2,000 Units by mouth every morning.   Dulaglutide 3 MG/0.5ML SOPN Inject 3 mg into the  skin every Thursday.   empagliflozin (JARDIANCE) 10 MG TABS tablet Take 1 tablet (10 mg total) by mouth daily before breakfast.   glucose blood ([facility] ADVANCED GLUCOSE TEST) test strip Please specify directions, refills and quantity\ Once daily, [id number], 3 refills, 100 quantity Dr Lattie Haw MD   Insulin Glargine [facility] KWIKPEN) 100 UNIT/ML Inject 16 Units into the skin daily. (Patient taking differently: Inject 25 Units into the skin daily.)   Insulin Pen Needle (PEN NEEDLES) 32G X 6 MM MISC 1 each by Does not apply route daily.   nitroGLYCERIN (NITROSTAT) 0.4 MG SL tablet Place 1 tablet (0.4 mg total) under the tongue every 5 (five) minutes as needed for chest pain.   ondansetron (ZOFRAN) 4 MG tablet Take 1 tablet (4 mg total) by mouth daily as needed for nausea or vomiting.   potassium chloride SA (KLOR-CON) 20 MEQ tablet Take 2 tablets (40 mEq total) by mouth daily.   RELION PEN NEEDLES 32G X 4 MM MISC USE 1 STRIP TO CHECK GLUCOSE ONCE DAILY   rosuvastatin (CRESTOR)  10 MG tablet Take 1 tablet (10 mg total) by mouth daily.   Current Facility-Administered Medications for the 07/03/21 encounter (Office Visit) with Loel Dubonnet, NP  Medication   0.9 %  sodium chloride infusion     Review of Systems     All other systems reviewed and are otherwise negative except as noted above.  Physical Exam    VS:  BP 110/66   Pulse 72   Ht $R'5\' 3"'Kh$  (1.6 m)   Wt 214 lb 6.4 oz (97.3 kg)   SpO2 95%   BMI 37.98 kg/m  , BMI Body mass index is 37.98 kg/m.  Wt Readings from Last 3 Encounters:  07/03/21 214 lb 6.4 oz (97.3 kg)  06/26/21 215 lb 2 oz (97.6 kg)  06/04/21 217 lb 12.8 oz (98.8 kg)     GEN: Well nourished, overweight, well developed, in no acute distress. HEENT: normal. Neck: Supple, no JVD, carotid bruits, or masses. Cardiac: RRR, no murmurs, rubs, or gallops. No clubbing, cyanosis, edema.  Radials/PT 2+ and equal bilaterally.  Respiratory:  Respirations regular and  unlabored, clear to auscultation bilaterally. GI: Soft, nontender, nondistended. MS: No deformity or atrophy. Skin: Warm and dry, no rash. Neuro:  Strength and sensation are intact. Psych: Normal affect.  Assessment & Plan    CAD - Stable with no anginal symptoms. No indication for ischemic evaluation.  Mild-nonobstructive by CTA 01/2021. Add Aspirin $RemoveBef'81mg'jBIQJFpBgD$  daily. Continue Crestor $RemoveBeforeDE'10mg'SyyuhzjHLFhfuXI$  daily. Heart healthy diet and regular cardiovascular exercise encouraged.    HLD. LDL goal <70 - 02/26/21 LDL 47. Continue Crestor $RemoveBeforeDE'10mg'SEDDXCgzitqqkHZ$  daily.   HTN - BP well controlled. Continue current antihypertensive regimen.    DM2 - Continue to follow with PCP.   Disposition: Follow up in 1 year(s) with Dr. Johney Frame or APP.  Signed, Loel Dubonnet, NP 07/03/2021, 11:27 AM [place]

## 2021-07-03 NOTE — Patient Instructions (Signed)
Medication Instructions:  Your physician has recommended you make the following change in your medication:   START Aspirin EC 81mg  daily  *If you need a refill on your cardiac medications before your next appointment, please call your pharmacy*  Lab Work: None ordered today. Your cholesterol panel in April showed that your LDL (bad cholesterol) was at goal of less than 70.   Testing/Procedures: None ordered today.   Follow-Up: At [facility], you and your health needs are our priority.  As part of our continuing mission to provide you with exceptional heart care, we have created designated Provider Care Teams.  These Care Teams include your primary Cardiologist (physician) and Advanced Practice Providers (APPs -  Physician Assistants and Nurse Practitioners) who all work together to provide you with the care you need, when you need it.  We recommend signing up for the patient portal called "MyChart".  Sign up information is provided on this After Visit Summary.  MyChart is used to connect with patients for Virtual Visits (Telemedicine).  Patients are able to view lab/test results, encounter notes, upcoming appointments, etc.  Non-urgent messages can be sent to your provider as well.   To learn more about what you can do with MyChart, go to [email or web address].    Your next appointment:   1 year(s)  The format for your next appointment:   In Person  Provider:   You may see Freada Bergeron, MD or one of the following Advanced Practice Providers on your designated Care Team:   Richardson Dopp, PA-C Vin Cheraw, [place]   Other Instructions  Heart Healthy Diet Recommendations: A low-salt diet is recommended. Meats should be grilled, baked, or boiled. Avoid fried foods. Focus on lean protein sources like fish or chicken with vegetables and fruits. The American Heart Association is a Microbiologist!    Exercise recommendations: The American Heart Association recommends 150 minutes  of moderate intensity exercise weekly. Try 30 minutes of moderate intensity exercise 4-5 times per week. This could include walking, jogging, or swimming.   For coronary artery disease often called "heart disease" we aim for optimal guideline directed medical therapy. We use the "A, B, C"s to help keep [place] on track!  A = Aspirin 81mg  daily B = Blood pressure control C = Cholesterol control. You take Crestor to help control your cholesterol.  D = Don't forget nitroglycerin! This is an emergency tablet to be used if you have chest pain.

## 2021-07-03 NOTE — Telephone Encounter (Signed)
Patient calling to get [place] results

## 2021-07-03 NOTE — Telephone Encounter (Signed)
Called they said test is in process. Sonya Walton doesn't look to be completed

## 2021-07-07 LAB — NASH FIBROSURE
ALPHA 2-MACROGLOBULINS, QN: 296 mg/dL — ABNORMAL HIGH (ref 110–276)
ALT (SGPT) P5P: 54 IU/L — ABNORMAL HIGH (ref 0–40)
AST (SGOT) P5P: 50 IU/L — ABNORMAL HIGH (ref 0–40)
Apolipoprotein A-1: 132 mg/dL (ref 116–209)
Bilirubin, Total: 0.3 mg/dL (ref 0.0–1.2)
Cholesterol, Total: 122 mg/dL (ref 100–199)
Fibrosis Score: 0.55 — ABNORMAL HIGH (ref 0.00–0.21)
GGT: 165 IU/L — ABNORMAL HIGH (ref 0–60)
Glucose: 160 mg/dL — ABNORMAL HIGH (ref 65–99)
Haptoglobin: 100 mg/dL (ref 37–355)
Height: 63 in
NASH Score: 0.75 — ABNORMAL HIGH
Steatosis Score: 0.82 — ABNORMAL HIGH (ref 0.00–0.30)
Triglycerides: 107 mg/dL (ref 0–149)
Weight: 215 [lb_av]

## 2021-07-07 LAB — FIB-4 W/REFLEX NASH FIBROSURE
ALT: 44 IU/L — ABNORMAL HIGH (ref 0–32)
AST: 44 IU/L — ABNORMAL HIGH (ref 0–40)
FIB-4 Index: 1.96 (ref 0.00–2.67)
Platelets: 206 10*3/uL (ref 150–450)

## 2021-07-07 NOTE — Telephone Encounter (Signed)
Patient contacted on 07/03/2021 with ultrasound results as follows:  Abdominal ultrasound does show subtle nodularity of the liver, consistent with cirrhosis.  Otherwise no masses and Doppler demonstrates normal patent portal vein.  Based on the cirrhotic appearing liver, recommend upper endoscopy to evaluate for esophageal varices and portal hypertensive gastropathy.  We will follow-up on fib 4 testing.  Can discuss that role of additional serologic evaluation to evaluate for etiology of cirrhosis at time of her follow-up appointment in the GI clinic.

## 2021-07-08 ENCOUNTER — Telehealth: Payer: Self-pay | Admitting: Emergency Medicine

## 2021-07-08 ENCOUNTER — Encounter: Payer: Self-pay | Admitting: Gastroenterology

## 2021-07-08 IMAGING — DX DG CHEST 2V
1 series · 1 of 1 positions shown · non-contrast
Comparison: Same day earlier at [DATE] p.m.
COMPARISON: Same day earlier at [DATE] p.m.

Addendum:
CLINICAL DATA: Right pneumothorax post bronchoscopy.

EXAM:
CHEST - 2 VIEW

[chest]
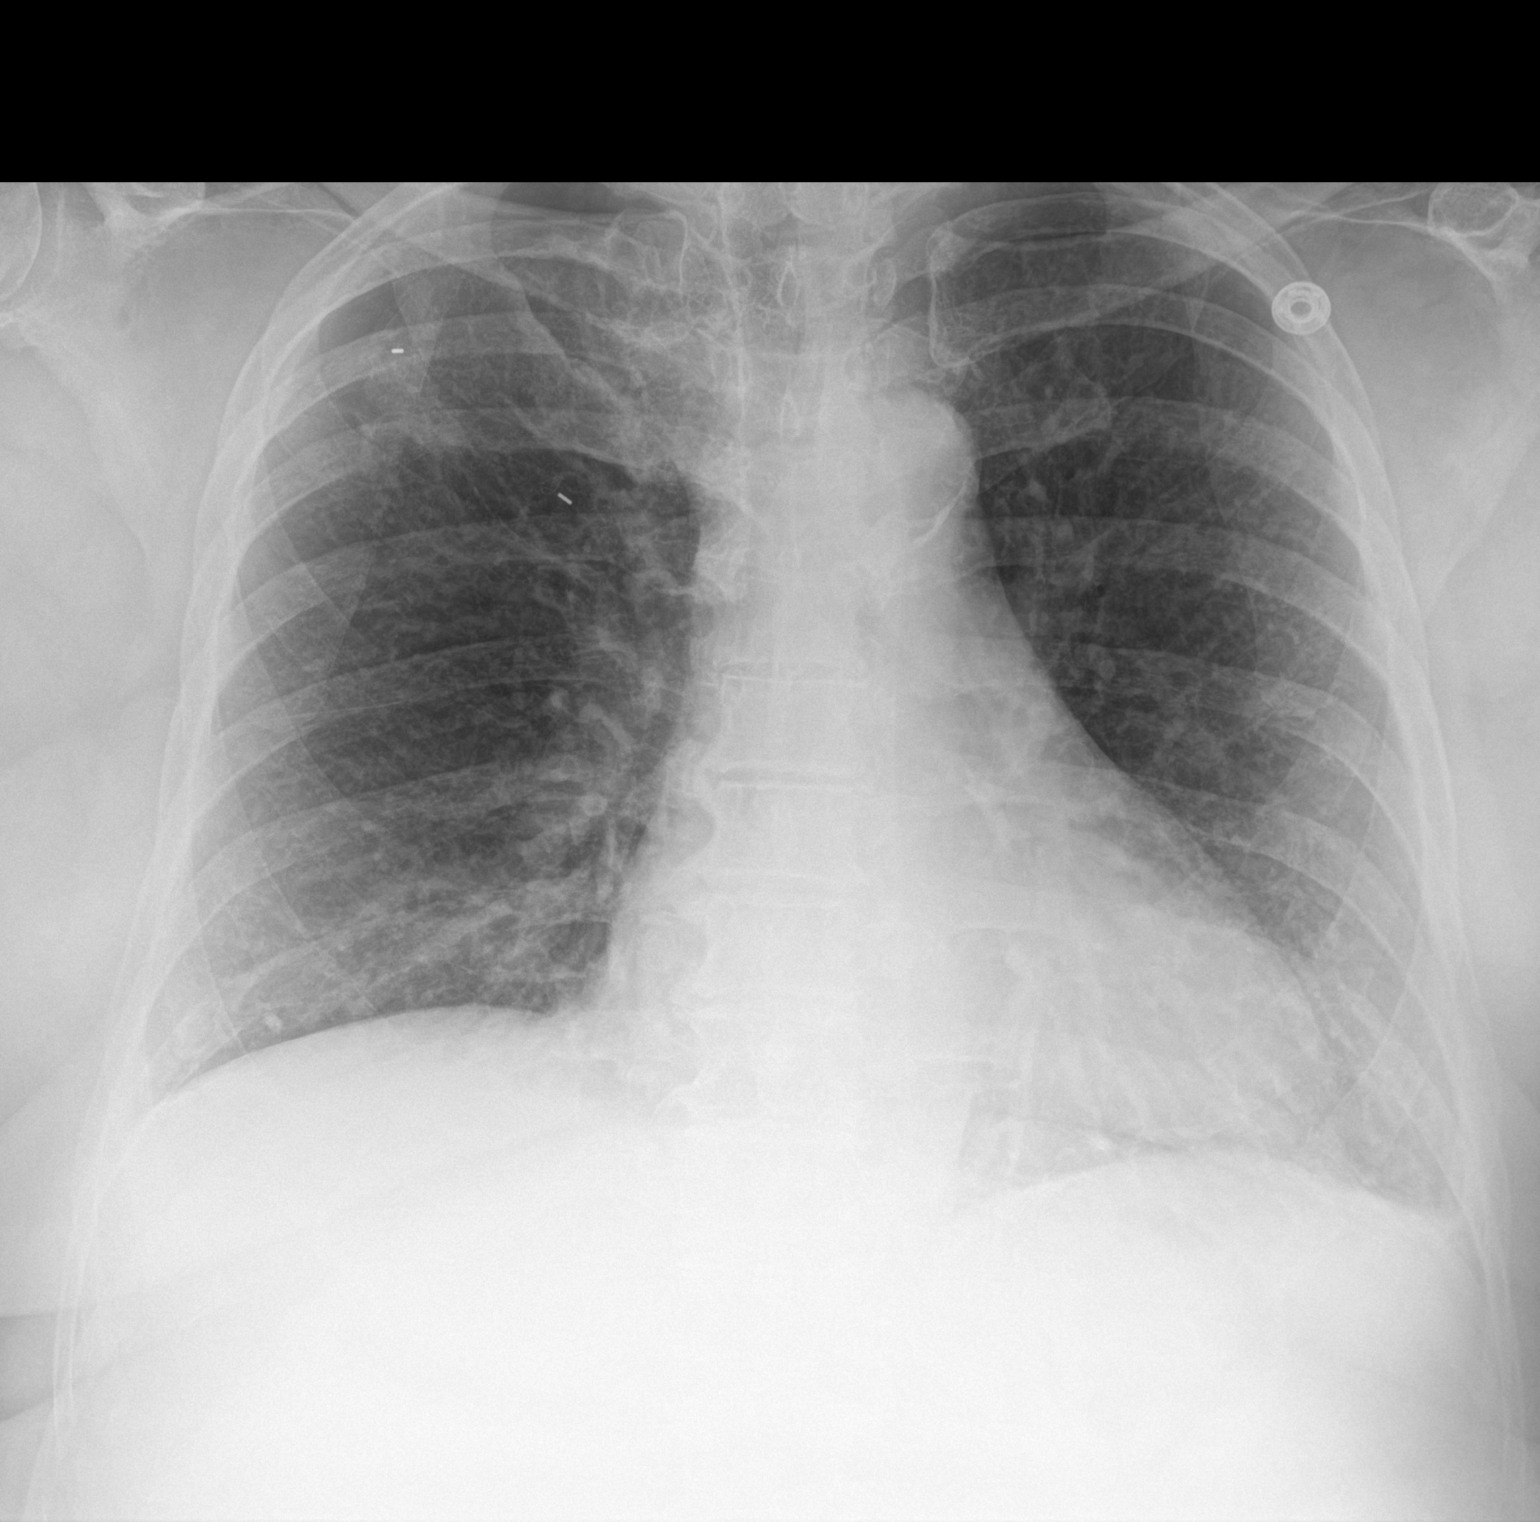

[1 of 1 positions shown; findings below may reference images not displayed]

FINDINGS: Slight interval worsening small right apical pneumothorax.
Postsurgical change over the right upper lung. Remainder the lungs
are unremarkable. Cardiomediastinal silhouette and remainder of the
exam is unchanged.
IMPRESSION: 1. Slight interval worsening small right apical pneumothorax.
2. Postsurgical change over the right upper lung.

ADDENDUM:
These results were called by telephone at the time of interpretation
on 03/09/2021 at [DATE] p.m. to provider ATIFETE PLAETEVOET , who verbally
acknowledged these results.

*** End of Addendum ***
FINDINGS: Slight interval worsening small right apical pneumothorax.
Postsurgical change over the right upper lung. Remainder the lungs
are unremarkable. Cardiomediastinal silhouette and remainder of the
exam is unchanged.
IMPRESSION: 1. Slight interval worsening small right apical pneumothorax.
2. Postsurgical change over the right upper lung.

## 2021-07-08 NOTE — Telephone Encounter (Signed)
Ok to change to phone visit

## 2021-07-08 NOTE — Telephone Encounter (Signed)
Called and spoke with pt and she is aware that the visit has been changed to telephone visit.  Nothing further is needed.

## 2021-07-08 NOTE — Telephone Encounter (Signed)
Called and spoke with Sonya Walton.  Sonya Walton stated she is scheduled 07/10/21 OV with Dr. Lamonte Sakai at 1100 to review CT results.  Sonya Walton is asking if OV can be changed to telephone visit or if Dr. Lamonte Sakai can call her with CT results?    Message routed to Dr. Lamonte Sakai to advise

## 2021-07-09 IMAGING — DX DG CHEST 1V PORT
1 series · 1 of 1 positions shown · non-contrast
Comparison: Portable chest 03/09/2021 and earlier.

CLINICAL DATA: 60-year-old female postoperative day 1 status post
bronchoscopic biopsy for right upper lobe lesion.

EXAM:
PORTABLE CHEST 1 VIEW

[chest ap]
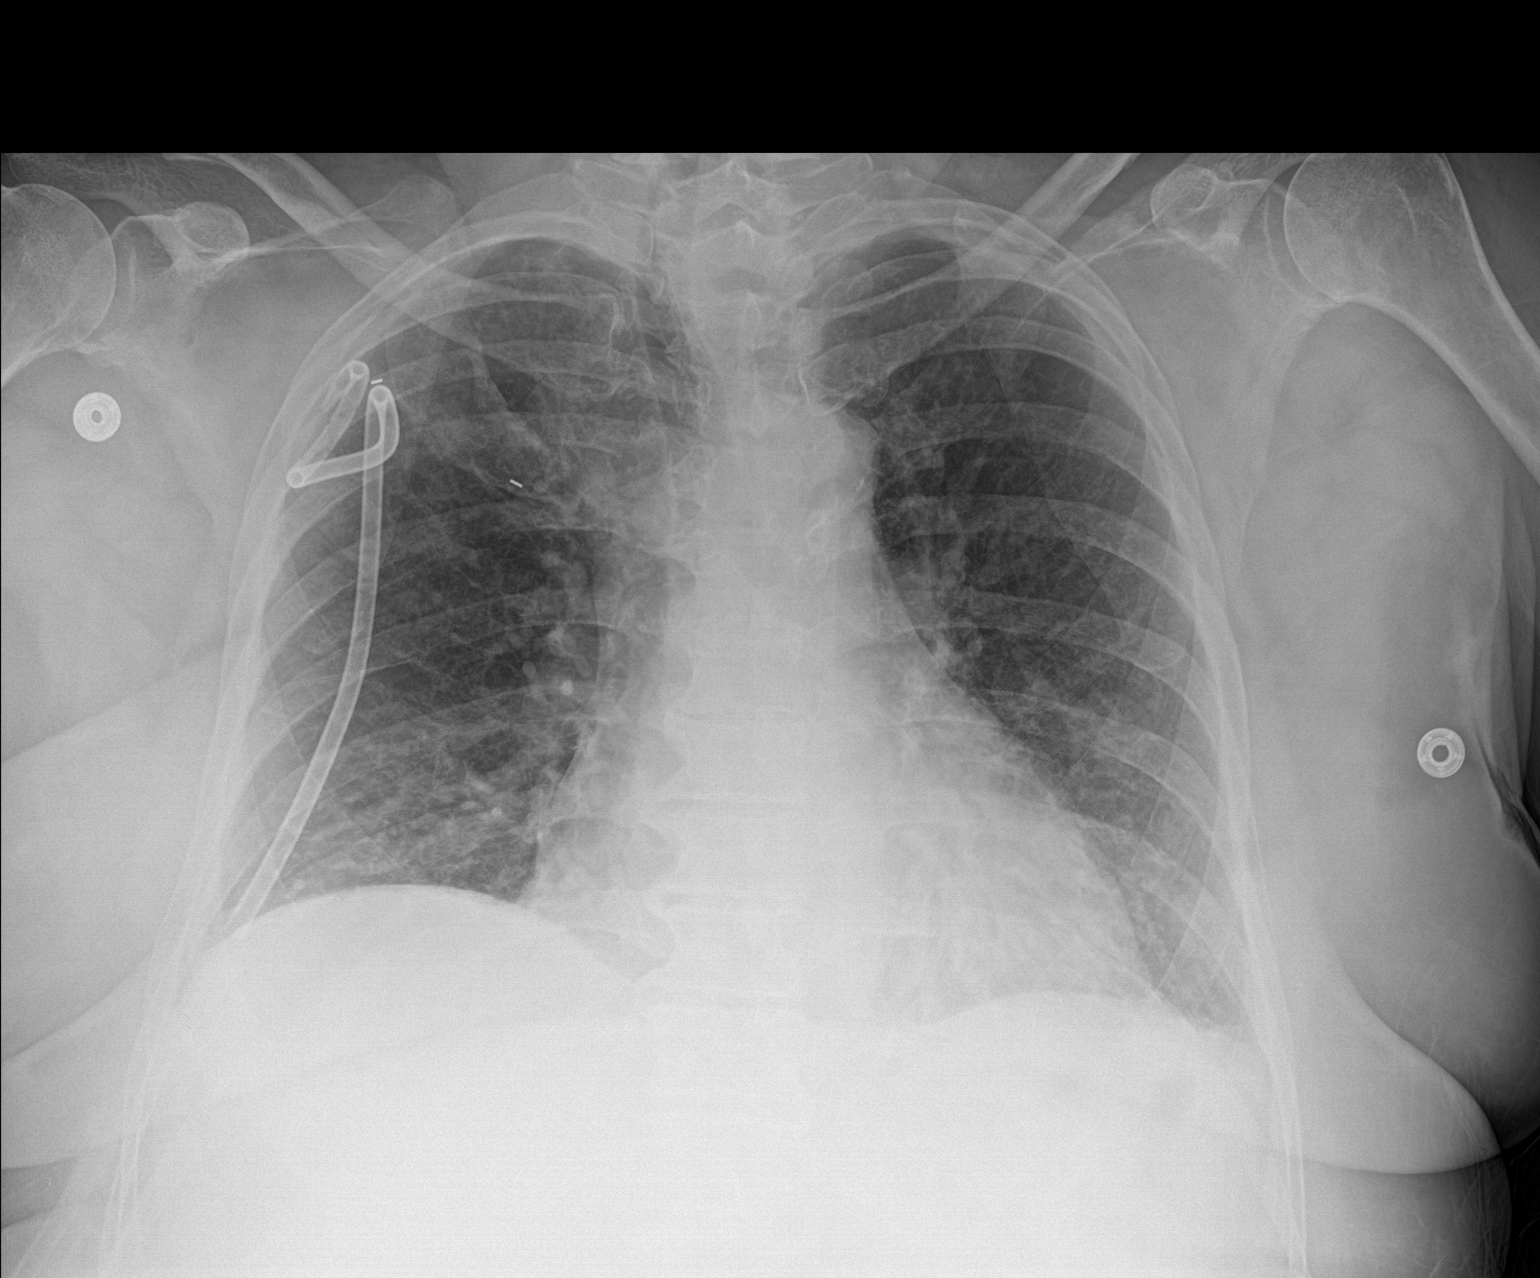

[1 of 1 positions shown; findings below may reference images not displayed]

FINDINGS: Portable AP semi upright view at 1917 hours. Stable right chest
tube. No pneumothorax identified. Improved lung volumes from
yesterday. Small surgical clip just above the right hilum is stable.
Mediastinal contours remain within normal limits. No acute pulmonary
opacity. No acute osseous abnormality identified. Mild rightward
deviation of the trachea at the thoracic inlet related to known
thyroid goiter.
IMPRESSION: 1. Stable right chest tube. No pneumothorax.
2. No acute cardiopulmonary abnormality.

## 2021-07-09 IMAGING — DX DG CHEST 1V PORT
1 series · 1 of 1 positions shown · non-contrast
Comparison: Single view of the chest earlier today.

CLINICAL DATA: Patient suffered a right pneumothorax after biopsy.
Chest tube in place.

EXAM:
PORTABLE CHEST 1 VIEW

[chest]
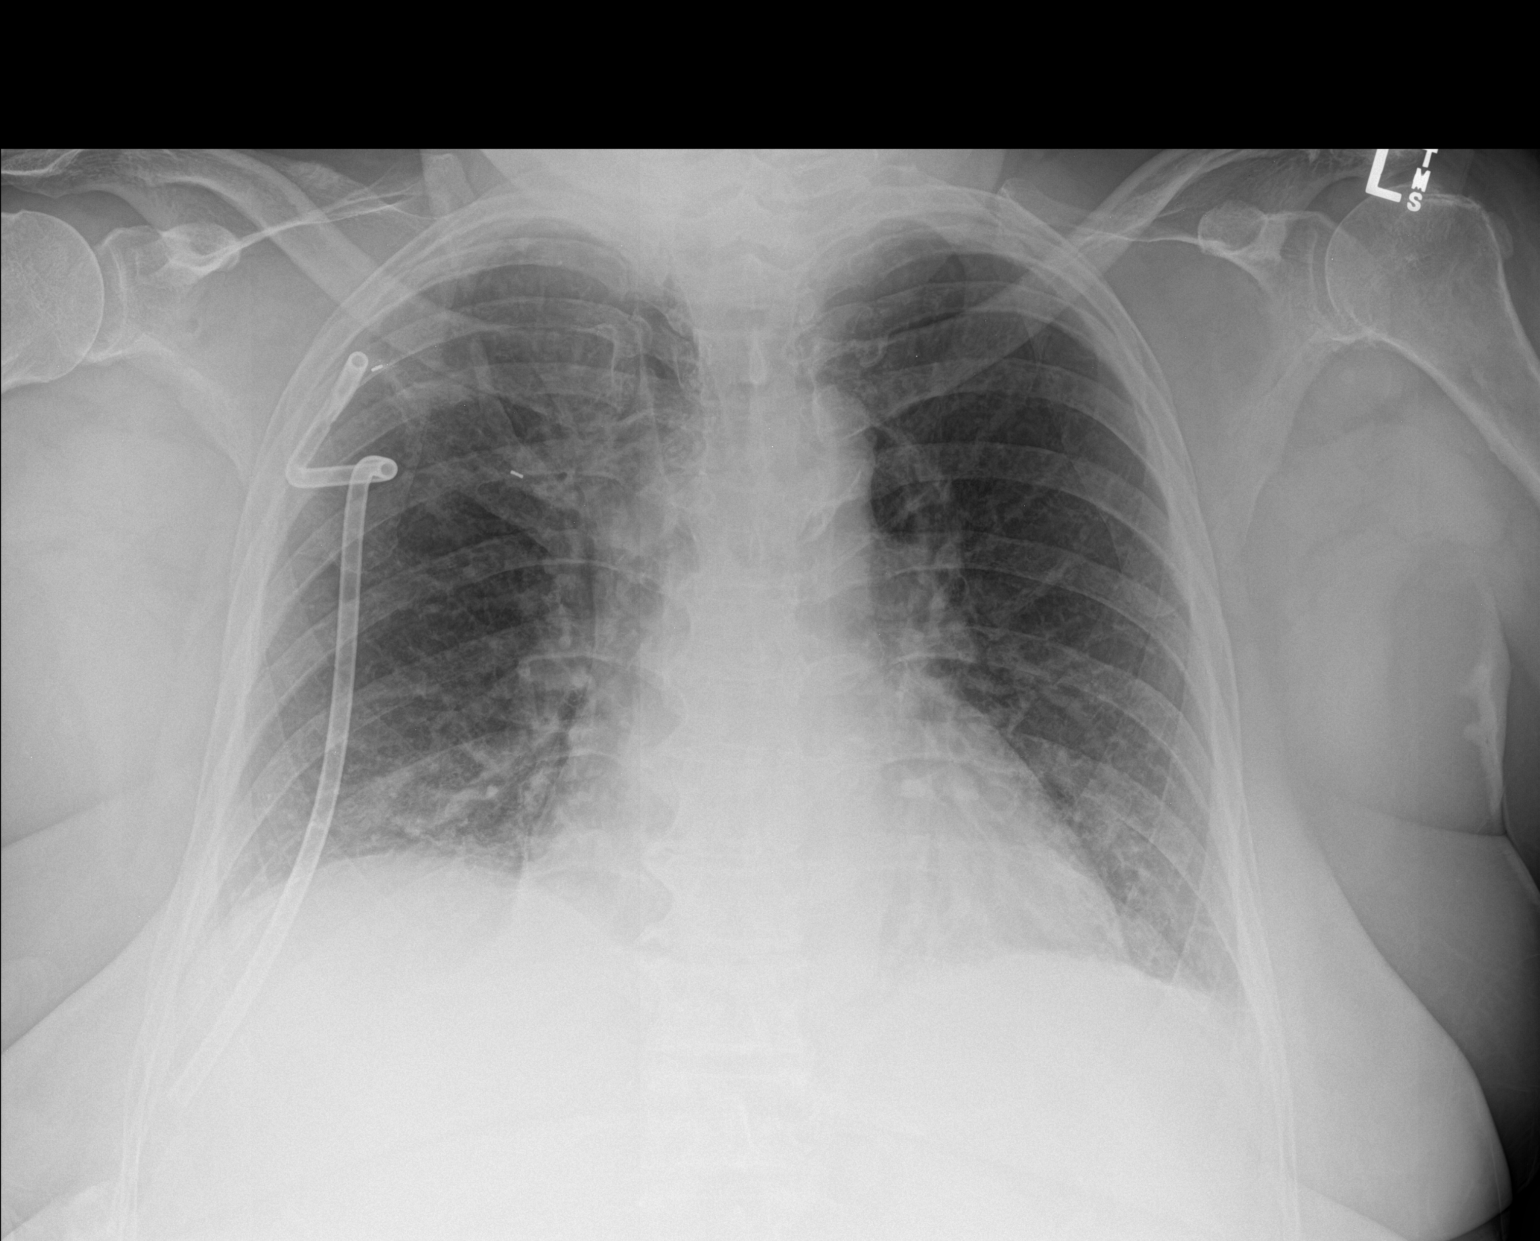

[1 of 1 positions shown; findings below may reference images not displayed]

FINDINGS: Right chest tube has backed out or been pulled back slightly. No
pneumothorax. Mild bibasilar atelectasis. Fiducial markers in the
right upper chest noted. Heart size is normal. Aortic
atherosclerosis.
IMPRESSION: The patient's right chest tube has backed out slightly or been
pulled back. Negative for pneumothorax. No new abnormality.

## 2021-07-10 ENCOUNTER — Encounter: Payer: Self-pay | Admitting: Emergency Medicine

## 2021-07-10 ENCOUNTER — Other Ambulatory Visit: Payer: Self-pay

## 2021-07-10 ENCOUNTER — Telehealth: Payer: Self-pay | Admitting: Emergency Medicine

## 2021-07-10 ENCOUNTER — Ambulatory Visit (INDEPENDENT_AMBULATORY_CARE_PROVIDER_SITE_OTHER): Payer: 59 | Admitting: Emergency Medicine

## 2021-07-10 DIAGNOSIS — K7581 Nonalcoholic steatohepatitis (NASH): Secondary | ICD-10-CM

## 2021-07-10 DIAGNOSIS — R918 Other nonspecific abnormal finding of lung field: Secondary | ICD-10-CM

## 2021-07-10 DIAGNOSIS — K7460 Unspecified cirrhosis of liver: Secondary | ICD-10-CM

## 2021-07-10 NOTE — Progress Notes (Signed)
Patient requested to go to [place] 9-12 to have lab work done

## 2021-07-10 NOTE — Progress Notes (Signed)
Virtual Visit via Telephone Note  I connected with Sonya Walton on 07/10/21 at 11:00 AM EDT by telephone and verified that I am speaking with the correct person using two identifiers.  Location: Patient: home Provider: office   I discussed the limitations, risks, security and privacy concerns of performing an evaluation and management service by telephone and the availability of in person appointments. I also discussed with the patient that there may be a patient responsible charge related to this service. The patient expressed understanding and agreed to proceed.   History of Present Illness: 60 year old former smoker with COPD, diabetes, hypertension, allergic rhinitis.  We have been evaluating a subsolid apical right upper lobe mass with associated mediastinal adenopathy.  A bronchoscopy 03/09/2021 showed atypical cells but was nondiagnostic.  Her cultures are negative.   Observations/Objective: CT scan of the chest 06/13/2021 reviewed by me, shows some slight enlargement of right upper lobe pulmonary nodule compared with her priors. She is feeling well.  Discussed the options for next steps w her including possible referral for surgery, possible repeat bronchoscopy for another attempt at a tissue diagnosis.  Also discussed that she may be a candidate for radiation oncology and XRT even without diagnostic biopsy material since she had atypical cells and the nodule is growing.  She wants to at least hear radiation oncology's opinion before deciding next steps.    Assessment and Plan: Mass of right lung Enlarging on her interval CT scan of the chest.  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  Atypical cells obtained on her bronchoscopy but nondiagnostic.  She is hesitant to commit to a repeat bronchoscopy and I think she needs a tissue diagnosis before we will commit to thoracic surgery.  She wants me to discuss her case in the thoracic oncology conference, see if she is a  candidate for empiric SBRT.  If radiation oncology would prefer a tissue diagnosis then we can talk about the timing of a repeat bronchoscopy versus following serial films.   Follow Up Instructions: OV after I discuss her case at Thoracic conference next week   I discussed the assessment and treatment plan with the patient. The patient was provided an opportunity to ask questions and all were answered. The patient agreed with the plan and demonstrated an understanding of the instructions.   The patient was advised to call back or seek an in-person evaluation if the symptoms worsen or if the condition fails to improve as anticipated.  I provided 18 minutes of non-face-to-face time during this encounter.   Collene Gobble, MD

## 2021-07-10 NOTE — Assessment & Plan Note (Signed)
Enlarging on her interval CT scan of the chest.  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  Atypical cells obtained on her bronchoscopy but nondiagnostic.  She is hesitant to commit to a repeat bronchoscopy and I think she needs a tissue diagnosis before we will commit to thoracic surgery.  She wants me to discuss her case in the thoracic oncology conference, see if she is a candidate for empiric SBRT.  If radiation oncology would prefer a tissue diagnosis then we can talk about the timing of a repeat bronchoscopy versus following serial films.

## 2021-07-13 NOTE — Telephone Encounter (Signed)
Attempted to call pt but unable to reach and unable to leave VM. Will try to call back later.

## 2021-07-13 NOTE — Telephone Encounter (Signed)
Called and spoke with pt. Pt said she read the AVS on mychart after her televisit with RB on 9/9 and said that it stated on there that she was a former smoker with history of COPD.  Pt said that she has never been told by RB that she had COPD and also states that she has never been treated for it and wants to know if this is a misprint or if this is correct.  Dr. Lamonte Sakai, please advise.

## 2021-07-14 NOTE — Progress Notes (Signed)
Received notification from [employer]) regarding approval for [employer]. Patient assistance approved from 07/14/21 to 07/14/22.  MEDICATION WILL SHIP TO PT'S HOME  Phone: [phone]

## 2021-07-14 NOTE — Progress Notes (Addendum)
Received notification from [employer] regarding RE ENROLLMENT approval for [employer] . Patient assistance WILL CONTINUE from 07/30/21 to 07/30/22.  MEDICATION WILL SHIP TO PT'S HOME  Phone: [phone]

## 2021-07-14 NOTE — Telephone Encounter (Signed)
Please let her know that I reviewed her testing -- the pulmonary function testing shows some evidence for mild obstruction or COPD, but I do not see that it has been significant enough for [place] to start any kind of treatment. If she develops shortness of breath or other symptoms than we may decide to do so at some point in the future.

## 2021-07-14 NOTE — Telephone Encounter (Signed)
Spoke with the pt and notified of response per RB  She verbalized understanding  Nothing further needed

## 2021-07-16 ENCOUNTER — Other Ambulatory Visit: Payer: Self-pay | Admitting: *Deleted

## 2021-07-16 ENCOUNTER — Telehealth: Payer: Self-pay | Admitting: Emergency Medicine

## 2021-07-16 DIAGNOSIS — R918 Other nonspecific abnormal finding of lung field: Secondary | ICD-10-CM

## 2021-07-16 LAB — ANA: Anti Nuclear Antibody (ANA): NEGATIVE

## 2021-07-16 LAB — ANTI-MICROSOMAL ANTIBODY LIVER / KIDNEY: LKM1 Ab: 6.5 Units (ref 0.0–20.0)

## 2021-07-16 LAB — IGG, IGA, IGM
IgA/Immunoglobulin A, Serum: 278 mg/dL (ref 87–352)
IgG (Immunoglobin G), Serum: 1408 mg/dL (ref 586–1602)
IgM (Immunoglobulin M), Srm: 80 mg/dL (ref 26–217)

## 2021-07-16 LAB — IRON,TIBC AND FERRITIN PANEL
Ferritin: 283 ng/mL — ABNORMAL HIGH (ref 15–150)
Iron Saturation: 30 % (ref 15–55)
Iron: 79 ug/dL (ref 27–139)
Total Iron Binding Capacity: 266 ug/dL (ref 250–450)
UIBC: 187 ug/dL (ref 118–369)

## 2021-07-16 LAB — HEPATITIS B SURFACE ANTIBODY,QUALITATIVE: Hep B Surface Ab, Qual: NONREACTIVE

## 2021-07-16 LAB — HEPATITIS B CORE ANTIBODY, TOTAL: Hep B Core Total Ab: NEGATIVE

## 2021-07-16 LAB — HEPATITIS C ANTIBODY: Hep C Virus Ab: <0.1 s/co ratio (ref 0.0–0.9)

## 2021-07-16 LAB — ANTI-SMOOTH MUSCLE ANTIBODY, IGG: Smooth Muscle Ab: 5 Units (ref 0–19)

## 2021-07-16 LAB — HEPATITIS A ANTIBODY, TOTAL: hep A Total Ab: NEGATIVE

## 2021-07-16 LAB — ALPHA-1-ANTITRYPSIN: A-1 Antitrypsin: 162 mg/dL (ref 101–187)

## 2021-07-16 LAB — HEPATITIS B SURFACE ANTIGEN: Hepatitis B Surface Ag: NEGATIVE

## 2021-07-16 LAB — CERULOPLASMIN: Ceruloplasmin: 20.3 mg/dL (ref 19.0–39.0)

## 2021-07-16 LAB — MITOCHONDRIAL ANTIBODIES: Mitochondrial Ab: <20 Units (ref 0.0–20.0)

## 2021-07-16 NOTE — Telephone Encounter (Signed)
Pt returning missed call [phone]

## 2021-07-16 NOTE — Telephone Encounter (Signed)
I discussed Sonya Walton's case at thoracic conference this morning.  She has an enlarging right upper lobe irregular pulmonary nodule that was negative on bronchoscopy and biopsies in May. Consensus was that she may be a surgical candidate and we should at least explore this.  Also consider repeat bronchoscopy or TTNA to get tissue.  If procedure cannot be done then radiation oncology would consider empiric SBRT.  Called her to discuss this but no answer.  I will try her again.

## 2021-07-16 NOTE — Progress Notes (Signed)
The proposed treatment discussed in conference is for discussion purpose only and is not a binding recommendation.  The patients have not been physically examined, or presented with their treatment options.  Therefore, final treatment plans cannot be decided.  

## 2021-07-16 NOTE — Telephone Encounter (Signed)
Call made to patient daughter (DPR), confirmed patient DOB, she is going to have the patient give [place] a call as well.I made her aware we take lunch between 1230-130 so try and call before or after then. Voiced understanding.   Nothing further needed at this time.

## 2021-07-17 NOTE — Telephone Encounter (Signed)
I was able to reach the patient and discuss options with her.  Her case was reviewed at thoracic oncology clinic.  I did explain to her that she may be a surgical candidate.  She has some hesitation about having any procedure, in particular she would like to avoid a repeat biopsy because her previous was complicated by pneumothorax.  That said she is willing to speak with thoracic surgery to see what surgery would be like, but cannot downtime to expect, what, complications could result.  I think that this is a good plan, will give her as much information as possible in order to make the best decision going forward.  I will make the referral to thoracic surgery today.

## 2021-07-17 NOTE — Addendum Note (Signed)
Addended by: Collene Gobble on: 07/17/2021 10:29 AM   Modules accepted: Orders

## 2021-07-21 ENCOUNTER — Encounter: Payer: PRIVATE HEALTH INSURANCE | Admitting: Thoracic Surgery (Cardiothoracic Vascular Surgery)

## 2021-07-22 ENCOUNTER — Encounter: Payer: PRIVATE HEALTH INSURANCE | Admitting: Thoracic Surgery (Cardiothoracic Vascular Surgery)

## 2021-07-24 ENCOUNTER — Encounter: Payer: Self-pay | Admitting: Thoracic Surgery (Cardiothoracic Vascular Surgery)

## 2021-07-24 ENCOUNTER — Institutional Professional Consult (permissible substitution) (INDEPENDENT_AMBULATORY_CARE_PROVIDER_SITE_OTHER): Payer: PRIVATE HEALTH INSURANCE | Admitting: Thoracic Surgery (Cardiothoracic Vascular Surgery)

## 2021-07-24 ENCOUNTER — Other Ambulatory Visit: Payer: Self-pay

## 2021-07-24 VITALS — BP 137/76 | HR 84 | Resp 20 | Ht 63.0 in | Wt 210.0 lb

## 2021-07-24 DIAGNOSIS — R918 Other nonspecific abnormal finding of lung field: Secondary | ICD-10-CM | POA: Diagnosis not present

## 2021-07-24 NOTE — H&P (View-Only) (Signed)
PCP is Lattie Haw, MD Referring Provider is Collene Gobble, MD  Chief Complaint  Patient presents with   Lung Mass    Surgical consult, Chest CT 06/13/21, PET Scan 01/05/21, PFT's 12/18/20    HPI: Sonya Walton is sent for consultation regarding a right upper lobe lung lesion.  Sonya Walton is a 60 year old woman with a history of tobacco abuse (quit 2019), hypertension, hyperlipidemia, osteoarthritis, type 2 insulin-dependent diabetes without complication, and a thyroid nodule.  She has been followed for a mixed density mass in the right upper lobe dating back to November 2021.  Over time the nodule had increased in size.  She had a navigational bronchoscopy by Dr. Lamonte Sakai in May 2022.  She did have a pneumothorax afterwards.  Biopsies were nondiagnostic but there were some atypical cells.  She did not want to pursue any further work-up or treatment at that point.  And she continue to be followed.    More recently she had a follow-up CT which showed a slight increase in size.  Dr. Lamonte Sakai recommended a repeat bronchoscopy but she does not want to do that.  She also is reluctant to consider surgical resection.  She finally agreed to talk to [place] about possible surgery.  She also has questions about possible stereotactic radiation.  She denies cough, shortness of breath, wheezing, chest pain, tightness, or pressure.  She denies change in appetite or weight loss.  Zubrod Score: At the time of surgery this patient's most appropriate activity status/level should be described as: _0     0    Normal activity, no symptoms _1     1    Restricted in physical strenuous activity but ambulatory, able to do out light work _2     2    Ambulatory and capable of self care, unable to do work activities, up and about >50 % of waking hours                              _3     3    Only limited self care, in bed greater than 50% of waking hours _4     4    Completely disabled, no self care, confined to bed or chair _5      5    Moribund  Past Medical History:  Diagnosis Date   Allergy    Arthritis    Diabetes mellitus without complication ([place])    Hoarse voice quality 01/19/2016   Hypertension    states under control with med., has been on med. since 2011   Immature cataract    Osteoarthritis 12/2015   right AC joint   Partial tear of right rotator cuff 12/2015   Wears dentures    upper    Past Surgical History:  Procedure Laterality Date   BREAST BIOPSY Left    BRONCHIAL BIOPSY  03/09/2021   Procedure: BRONCHIAL BIOPSIES;  Surgeon: Collene Gobble, MD;  Location: [facility] ENDOSCOPY;  Service: Pulmonary;;   BRONCHIAL BRUSHINGS  03/09/2021   Procedure: BRONCHIAL BRUSHINGS;  Surgeon: Collene Gobble, MD;  Location: [place];  Service: Pulmonary;;   BRONCHIAL NEEDLE ASPIRATION BIOPSY  03/09/2021   Procedure: BRONCHIAL NEEDLE ASPIRATION BIOPSIES;  Surgeon: Collene Gobble, MD;  Location: [facility]COPY;  Service: Pulmonary;;   BUNIONECTOMY Bilateral 1994   CHOLECYSTECTOMY  01/17/2012   Procedure: LAPAROSCOPIC CHOLECYSTECTOMY;  Surgeon: Harl Bowie, MD;  Location: [place];  Service: General;  Laterality: N/A;  FIDUCIAL MARKER PLACEMENT  03/09/2021   Procedure: FIDUCIAL MARKER PLACEMENT;  Surgeon: Collene Gobble, MD;  Location: [facility] ENDOSCOPY;  Service: Pulmonary;;   PUBOVAGINAL SLING  03/05/2003   SHOULDER ARTHROSCOPY WITH DISTAL CLAVICLE RESECTION Right 01/26/2016   Procedure: SHOULDER ARTHROSCOPY WITH DISTAL CLAVICLE RESECTION;  Surgeon: Tania Ade, MD;  Location: [place];  Service: Orthopedics;  Laterality: Right;   SHOULDER ARTHROSCOPY WITH SUBACROMIAL DECOMPRESSION Right 01/26/2016   Procedure: SHOULDER ARTHROSCOPY WITH SUBACROMIAL DECOMPRESSION DEBRIDEMENT;  Surgeon: Tania Ade, MD;  Location: [place];  Service: Orthopedics;  Laterality: Right;   TONSILLECTOMY  1975   TOTAL VAGINAL HYSTERECTOMY  03/05/2003   TUBAL LIGATION  1981   VAGINAL HYSTERECTOMY  2004    partial    VIDEO BRONCHOSCOPY WITH ENDOBRONCHIAL NAVIGATION N/A 03/09/2021   Procedure: VIDEO BRONCHOSCOPY WITH ENDOBRONCHIAL NAVIGATION;  Surgeon: Collene Gobble, MD;  Location: [place] ENDOSCOPY;  Service: Pulmonary;  Laterality: N/A;    Family History  Problem Relation Age of Onset   Liver cancer Father    Cancer Father        lung and liver   Heart disease Mother    Hyperlipidemia Mother    Hypertension Mother    Heart failure Mother    Hypertension Other    Diabetes Other    Asthma Other    Diabetes type II Sister    Breast cancer Sister    Asthma Sister    COPD Sister    Diabetes type II Brother    Cancer Paternal Aunt        lung   Hypertension Sister    Hypertension Brother    Diabetes Brother    Asthma Brother    Cancer Other        lung   Colon polyps Neg Hx    Esophageal cancer Neg Hx    Rectal cancer Neg Hx    Stomach cancer Neg Hx     Social History Social History   Tobacco Use   Smoking status: Former    Packs/day: 1.00    Years: 37.00    Pack years: 37.00    Types: Cigarettes    Start date: 11/01/1981    Quit date: 01/09/2018    Years since quitting: 3.5   Smokeless tobacco: Never   Tobacco comments:    1 ppd max use.   Vaping Use   Vaping Use: Never used  Substance Use Topics   Alcohol use: Yes    Alcohol/week: 0.0 standard drinks    Comment: rarely   Drug use: No    Current Outpatient Medications  Medication Sig Dispense Refill   acetaminophen (TYLENOL) 500 MG tablet Take 1,000 mg by mouth every 6 (six) hours as needed for mild pain or moderate pain.     aspirin EC 81 MG tablet Take 1 tablet (81 mg total) by mouth daily. Swallow whole. 90 tablet 3   atenolol-chlorthalidone (TENORETIC) 50-25 MG tablet TAKE 1 TABLET BY MOUTH EVERY DAY (Patient taking differently: Take 1 tablet by mouth daily.) 90 tablet 3   blood glucose meter kit and supplies KIT Inject 1 each into the skin as directed. Use meter once daily to measure blood glucose. 1 each 0    cetirizine (ZYRTEC) 10 MG tablet Chew 10 mg by mouth daily. (Patient not taking: Reported on 07/10/2021)     Cholecalciferol (VITAMIN D3) 2000 UNITS TABS Take 2,000 Units by mouth every morning.     Dulaglutide 3 MG/0.5ML SOPN Inject  3 mg into the skin every Thursday.     glucose blood ([employer] ADVANCED GLUCOSE TEST) test strip Please specify directions, refills and quantity\ Once daily, [id number], 3 refills, 100 quantity Dr Lattie Haw MD 100 each 0   Insulin Glargine (BASAGLAR KWIKPEN) 100 UNIT/ML Inject 16 Units into the skin daily. (Patient taking differently: Inject 25 Units into the skin daily.)     Insulin Pen Needle (PEN NEEDLES) 32G X 6 MM MISC 1 each by Does not apply route daily. 50 each 6   nitroGLYCERIN (NITROSTAT) 0.4 MG SL tablet Place 1 tablet (0.4 mg total) under the tongue every 5 (five) minutes as needed for chest pain. 30 tablet 12   ondansetron (ZOFRAN) 4 MG tablet Take 1 tablet (4 mg total) by mouth daily as needed for nausea or vomiting. 30 tablet 0   potassium chloride SA (KLOR-CON) 20 MEQ tablet Take 2 tablets (40 mEq total) by mouth daily. 180 tablet 3   RELION PEN NEEDLES 32G X 4 MM MISC USE 1 STRIP TO CHECK GLUCOSE ONCE DAILY 50 each 0   rosuvastatin (CRESTOR) 10 MG tablet Take 1 tablet (10 mg total) by mouth daily. 90 tablet 3   Current Facility-Administered Medications  Medication Dose Route Frequency Provider Last Rate Last Admin   0.9 %  sodium chloride infusion  500 mL Intravenous Once Cirigliano, Vito V, DO        Allergies  Allergen Reactions   Percocet [Oxycodone-Acetaminophen] Other (See Comments)    Dysphoria    Review of Systems  Constitutional:  Negative for activity change, fatigue and unexpected weight change.  HENT:  Negative for voice change (Hoarse voice, chronic).   Eyes:  Negative for visual disturbance.  Respiratory:  Negative for cough, shortness of breath and wheezing.   Cardiovascular:  Negative for chest pain.  Genitourinary:   Negative for difficulty urinating and dysuria.  Musculoskeletal:  Positive for arthralgias.  Neurological:  Negative for seizures and syncope.  Hematological:  Does not bruise/bleed easily.   BP 137/76   Pulse 84   Resp 20   Ht _0  (1.6 m)   Wt 210 lb (95.3 kg)   SpO2 92% Comment: RA  BMI 37.20 kg/m  Physical Exam Constitutional:      General: She is not in acute distress.    Appearance: She is obese.  HENT:     Head: Normocephalic and atraumatic.  Eyes:     General: No scleral icterus.    Extraocular Movements: Extraocular movements intact.  Cardiovascular:     Rate and Rhythm: Normal rate and regular rhythm.     Heart sounds: Normal heart sounds. No murmur heard.   No friction rub. No gallop.  Pulmonary:     Effort: Pulmonary effort is normal. No respiratory distress.     Breath sounds: Normal breath sounds. No wheezing or rales.  Abdominal:     General: There is no distension.     Palpations: Abdomen is soft.  Musculoskeletal:     Cervical back: Neck supple.  Lymphadenopathy:     Cervical: No cervical adenopathy.  Skin:    General: Skin is warm and dry.  Neurological:     General: No focal deficit present.     Mental Status: She is alert and oriented to person, place, and time.     Cranial Nerves: No cranial nerve deficit.     Motor: No weakness.    Diagnostic Tests: CT CHEST WITHOUT CONTRAST   TECHNIQUE: Multidetector CT imaging  of the chest was performed using thin slice collimation for electromagnetic bronchoscopy planning purposes, without intravenous contrast.   COMPARISON:  Cardiac CT 02/05/2021, chest CT 01/30/2021 and PET-CT 01/05/2021. Thyroid ultrasound 10/03/2020.   FINDINGS: Cardiovascular: Atherosclerosis of the aorta, great vessels and coronary arteries. The heart size is normal. There is no pericardial effusion.   Mediastinum/Nodes: There are no enlarged mediastinal, hilar or axillary lymph nodes.Hilar assessment is limited by the lack  of intravenous contrast, although the hilar contours appear unchanged. Stable asymmetric enlargement of the left thyroid lobe with posterior extension measuring up to 4.6 x 3.3 cm on image 20/2, previously evaluated by ultrasound. Stable mass effect on the trachea. The esophagus appears unremarkable.   Lungs/Pleura: There is no pleural effusion. The dominant part solid nodule laterally at the right lung apex measures 3.3 x 1.5 cm on image 34/3 (previously 3.1 x 1.4 cm as measured in a similar fashion). As previously noted, this has mildly enlarged from baseline study of 09/05/2020. No increasing focal solid components identified. Several other subcentimeter solid and ground-glass pulmonary nodules bilaterally are unchanged. There are 2 new fiducial markers anteriorly in the right upper lobe, at least 2.5 cm away from the dominant part solid nodule.   Upper abdomen: Redemonstrated are morphologic changes of hepatic cirrhosis. No adrenal mass.   Musculoskeletal/Chest wall: There is no chest wall mass or suspicious osseous finding. Nodular density posteriorly in the left breast is grossly unchanged   IMPRESSION: 1. Imaging for bronchoscopy planning. 2. The known part solid right upper lobe lesion has minimally enlarged from previous study of 4 months ago, although has not developed significant solid components. Of note, the fiducial markers placed at previous bronchoscopy are removed from this dominant lesion. 3. Additional scattered pulmonary nodules are unchanged. 4. Stable asymmetric nodular enlargement of the left thyroid gland, previously evaluated by ultrasound. 5. Hepatic cirrhosis. 6. Coronary and Aortic Atherosclerosis (ICD10-I70.0).     Electronically Signed   By: Richardean Sale M.D.   On: 06/15/2021 09:14 I personally reviewed the CT images.  There is a mixed density nodule superior laterally in the right upper lobe that has increased in size over time.  No evidence  of adenopathy.  Pulmonary function testing to 17 2022 FVC 2.09 (65%) FEV1 1.64 (66%) DLCO 26.37 (134%)  Impression: Sonya Walton is a 60 year old woman with a history of tobacco abuse (quit 2019), hypertension, hyperlipidemia, osteoarthritis, type 2 insulin-dependent diabetes without complication, and a thyroid nodule.    Right upper lobe lung nodule-mixed density nodule superior lateral aspect of right upper lobe.  Has slowly grown in size over the past year.  Minimal activity on PET.  I concur with Dr. 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  We discussed multiple potential options including continued radiographic follow-up, repeat bronchoscopy, CT-guided biopsy, and surgical resection.  Given the nodule has grown over time I do not think continued radiographic follow-up is appropriate, unless she refuses all other options.  She refuses to have a repeat bronchoscopy because of her pneumothorax after the first 1.  I discussed CT-guided biopsy but emphasized to Sonya Walton and her daughter that there can be the same issues with sampling error.  Finally the most aggressive, but also most definitive option would be to proceed with surgical resection.  That would involve a wedge resection and possible lobectomy if the lesion more cancerous.  She does have adequate pulmonary function to tolerate resection.  I informed her and her daughter of the  general nature of the proposed procedure.  We will plan to do a robotic right upper lobe wedge resection to be followed by lobectomy if the nodule is cancerous.  Given the size of the lesion I think will be difficult to get an adequate margin with just a wedge resection.  I informed them of the need for general anesthesia, the incisions to be used, the use of drains to postoperatively, the expected hospital stay, and the overall recovery.  I informed them of the indications, risks, benefits, and alternatives.  They understand the  risks include, but not limited to death, MI, DVT, PE, bleeding, possible need for transfusion, infection, prolonged air leak, cardiac arrhythmias, as well as possibility of other unforeseeable complications.  She is undecided as to how she would like to proceed.  If she would like to proceed with surgery she can just call and we will schedule it.  If she would like to come back for further discussion with either myself or Dr. Lamonte Sakai we can arrange that.  We also could arrange an appointment with a radiation oncologist if she would like to talk to them before making a decision.  Plan: She wishes to think over her options Will return in 2 weeks to further discuss possible surgical resection.  [name], MD [facility] [phone]  She said if she did anything more aggressive then continued follow-up she would like to wait until after the holidays.  I encouraged her to consider doing something sooner than that.  If she does choose to delay intervention she would need another CT in about 3 months.  Revonda Standard Roxan Hockey, MD [facility] [phone]

## 2021-07-24 NOTE — Progress Notes (Addendum)
PCP is Patel, Poonam, MD Referring Provider is Byrum, Robert S, MD  Chief Complaint  Patient presents with   Lung Mass    Surgical consult, Chest CT 06/13/21, PET Scan 01/05/21, PFT's 12/18/20    HPI: [name] is sent for consultation regarding a right upper lobe lung lesion.  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  She has been followed for a mixed density mass in the right upper lobe dating back to November 2021.  Over time the nodule had increased in size.  She had a navigational bronchoscopy by Dr. Byrum in May 2022.  She did have a pneumothorax afterwards.  Biopsies were nondiagnostic but there were some atypical cells.  She did not want to pursue any further work-up or treatment at that point.  And she continue to be followed.    More recently she had a follow-up CT which showed a slight increase in size.  Dr. Byrum recommended a repeat bronchoscopy but she does not want to do that.  She also is reluctant to consider surgical resection.  She finally agreed to talk to us about possible surgery.  She also has questions about possible stereotactic radiation.  She denies cough, shortness of breath, wheezing, chest pain, tightness, or pressure.  She denies change in appetite or weight loss.  Zubrod Score: At the time of surgery this patient's most appropriate activity status/level should be described as: []    0    Normal activity, no symptoms [x]    1    Restricted in physical strenuous activity but ambulatory, able to do out light work []    2    Ambulatory and capable of self care, unable to do work activities, up and about >50 % of waking hours                              []    3    Only limited self care, in bed greater than 50% of waking hours []    4    Completely disabled, no self care, confined to bed or chair []     5    Moribund  Past Medical History:  Diagnosis Date   Allergy    Arthritis    Diabetes mellitus without complication (HCC)    Hoarse voice quality 01/19/2016   Hypertension    states under control with med., has been on med. since 2011   Immature cataract    Osteoarthritis 12/2015   right AC joint   Partial tear of right rotator cuff 12/2015   Wears dentures    upper    Past Surgical History:  Procedure Laterality Date   BREAST BIOPSY Left    BRONCHIAL BIOPSY  03/09/2021   Procedure: BRONCHIAL BIOPSIES;  Surgeon: Byrum, Robert S, MD;  Location: [facility];  Service: Pulmonary;;   BRONCHIAL BRUSHINGS  03/09/2021   Procedure: BRONCHIAL BRUSHINGS;  Surgeon: Byrum, Robert S, MD;  Location: [facility];  Service: Pulmonary;;   BRONCHIAL NEEDLE ASPIRATION BIOPSY  03/09/2021   Procedure: BRONCHIAL NEEDLE ASPIRATION BIOPSIES;  Surgeon: Byrum, Robert S, MD;  Location: [facility];  Service: Pulmonary;;   BUNIONECTOMY Bilateral 1994   CHOLECYSTECTOMY  01/17/2012   Procedure: LAPAROSCOPIC CHOLECYSTECTOMY;  Surgeon: Douglas A Blackman, MD;  Location: [facility];  Service: General;  Laterality: N/A;     FIDUCIAL MARKER PLACEMENT  03/09/2021   Procedure: FIDUCIAL MARKER PLACEMENT;  Surgeon: Byrum, Robert S, MD;  Location: [facility];  Service: Pulmonary;;   PUBOVAGINAL SLING  03/05/2003   SHOULDER ARTHROSCOPY WITH DISTAL CLAVICLE RESECTION Right 01/26/2016   Procedure: SHOULDER ARTHROSCOPY WITH DISTAL CLAVICLE RESECTION;  Surgeon: Justin Chandler, MD;  Location: [facility];  Service: Orthopedics;  Laterality: Right;   SHOULDER ARTHROSCOPY WITH SUBACROMIAL DECOMPRESSION Right 01/26/2016   Procedure: SHOULDER ARTHROSCOPY WITH SUBACROMIAL DECOMPRESSION DEBRIDEMENT;  Surgeon: Justin Chandler, MD;  Location: [facility];  Service: Orthopedics;  Laterality: Right;   TONSILLECTOMY  1975   TOTAL VAGINAL HYSTERECTOMY  03/05/2003   TUBAL LIGATION  1981   VAGINAL HYSTERECTOMY  2004    partial    VIDEO BRONCHOSCOPY WITH ENDOBRONCHIAL NAVIGATION N/A 03/09/2021   Procedure: VIDEO BRONCHOSCOPY WITH ENDOBRONCHIAL NAVIGATION;  Surgeon: Byrum, Robert S, MD;  Location: [facility];  Service: Pulmonary;  Laterality: N/A;    Family History  Problem Relation Age of Onset   Liver cancer Father    Cancer Father        lung and liver   Heart disease Mother    Hyperlipidemia Mother    Hypertension Mother    Heart failure Mother    Hypertension Other    Diabetes Other    Asthma Other    Diabetes type II Sister    Breast cancer Sister    Asthma Sister    COPD Sister    Diabetes type II Brother    Cancer Paternal Aunt        lung   Hypertension Sister    Hypertension Brother    Diabetes Brother    Asthma Brother    Cancer Other        lung   Colon polyps Neg Hx    Esophageal cancer Neg Hx    Rectal cancer Neg Hx    Stomach cancer Neg Hx     Social History Social History   Tobacco Use   Smoking status: Former    Packs/day: 1.00    Years: 37.00    Pack years: 37.00    Types: Cigarettes    Start date: 11/01/1981    Quit date: 01/09/2018    Years since quitting: 3.5   Smokeless tobacco: Never   Tobacco comments:    1 ppd max use.   Vaping Use   Vaping Use: Never used  Substance Use Topics   Alcohol use: Yes    Alcohol/week: 0.0 standard drinks    Comment: rarely   Drug use: No    Current Outpatient Medications  Medication Sig Dispense Refill   acetaminophen (TYLENOL) 500 MG tablet Take 1,000 mg by mouth every 6 (six) hours as needed for mild pain or moderate pain.     aspirin EC 81 MG tablet Take 1 tablet (81 mg total) by mouth daily. Swallow whole. 90 tablet 3   atenolol-chlorthalidone (TENORETIC) 50-25 MG tablet TAKE 1 TABLET BY MOUTH EVERY DAY (Patient taking differently: Take 1 tablet by mouth daily.) 90 tablet 3   blood glucose meter kit and supplies KIT Inject 1 each into the skin as directed. Use meter once daily to measure blood glucose. 1 each 0    cetirizine (ZYRTEC) 10 MG tablet Chew 10 mg by mouth daily. (Patient not taking: Reported on 07/10/2021)     Cholecalciferol (VITAMIN D3) 2000 UNITS TABS Take 2,000 Units by mouth every morning.     Dulaglutide 3 MG/0.5ML SOPN Inject   3 mg into the skin every Thursday.     glucose blood ([facility] ADVANCED GLUCOSE TEST) test strip Please specify directions, refills and quantity\ Once daily, NPI:[id number], 3 refills, 100 quantity Dr Poonam Patel MD 100 each 0   Insulin Glargine (BASAGLAR KWIKPEN) 100 UNIT/ML Inject 16 Units into the skin daily. (Patient taking differently: Inject 25 Units into the skin daily.)     Insulin Pen Needle (PEN NEEDLES) 32G X 6 MM MISC 1 each by Does not apply route daily. 50 each 6   nitroGLYCERIN (NITROSTAT) 0.4 MG SL tablet Place 1 tablet (0.4 mg total) under the tongue every 5 (five) minutes as needed for chest pain. 30 tablet 12   ondansetron (ZOFRAN) 4 MG tablet Take 1 tablet (4 mg total) by mouth daily as needed for nausea or vomiting. 30 tablet 0   potassium chloride SA (KLOR-CON) 20 MEQ tablet Take 2 tablets (40 mEq total) by mouth daily. 180 tablet 3   RELION PEN NEEDLES 32G X 4 MM MISC USE 1 STRIP TO CHECK GLUCOSE ONCE DAILY 50 each 0   rosuvastatin (CRESTOR) 10 MG tablet Take 1 tablet (10 mg total) by mouth daily. 90 tablet 3   Current Facility-Administered Medications  Medication Dose Route Frequency Provider Last Rate Last Admin   0.9 %  sodium chloride infusion  500 mL Intravenous Once Cirigliano, Vito V, DO        Allergies  Allergen Reactions   Percocet [Oxycodone-Acetaminophen] Other (See Comments)    Dysphoria    Review of Systems  Constitutional:  Negative for activity change, fatigue and unexpected weight change.  HENT:  Negative for voice change (Hoarse voice, chronic).   Eyes:  Negative for visual disturbance.  Respiratory:  Negative for cough, shortness of breath and wheezing.   Cardiovascular:  Negative for chest pain.  Genitourinary:   Negative for difficulty urinating and dysuria.  Musculoskeletal:  Positive for arthralgias.  Neurological:  Negative for seizures and syncope.  Hematological:  Does not bruise/bleed easily.   BP 137/76   Pulse 84   Resp 20   Ht 5' 3" (1.6 m)   Wt 210 lb (95.3 kg)   SpO2 92% Comment: RA  BMI 37.20 kg/m  Physical Exam Constitutional:      General: She is not in acute distress.    Appearance: She is obese.  HENT:     Head: Normocephalic and atraumatic.  Eyes:     General: No scleral icterus.    Extraocular Movements: Extraocular movements intact.  Cardiovascular:     Rate and Rhythm: Normal rate and regular rhythm.     Heart sounds: Normal heart sounds. No murmur heard.   No friction rub. No gallop.  Pulmonary:     Effort: Pulmonary effort is normal. No respiratory distress.     Breath sounds: Normal breath sounds. No wheezing or rales.  Abdominal:     General: There is no distension.     Palpations: Abdomen is soft.  Musculoskeletal:     Cervical back: Neck supple.  Lymphadenopathy:     Cervical: No cervical adenopathy.  Skin:    General: Skin is warm and dry.  Neurological:     General: No focal deficit present.     Mental Status: She is alert and oriented to person, place, and time.     Cranial Nerves: No cranial nerve deficit.     Motor: No weakness.    Diagnostic Tests: CT CHEST WITHOUT CONTRAST   TECHNIQUE: Multidetector CT imaging   of the chest was performed using thin slice collimation for electromagnetic bronchoscopy planning purposes, without intravenous contrast.   COMPARISON:  Cardiac CT 02/05/2021, chest CT 01/30/2021 and PET-CT 01/05/2021. Thyroid ultrasound 10/03/2020.   FINDINGS: Cardiovascular: Atherosclerosis of the aorta, great vessels and coronary arteries. The heart size is normal. There is no pericardial effusion.   Mediastinum/Nodes: There are no enlarged mediastinal, hilar or axillary lymph nodes.Hilar assessment is limited by the lack  of intravenous contrast, although the hilar contours appear unchanged. Stable asymmetric enlargement of the left thyroid lobe with posterior extension measuring up to 4.6 x 3.3 cm on image 20/2, previously evaluated by ultrasound. Stable mass effect on the trachea. The esophagus appears unremarkable.   Lungs/Pleura: There is no pleural effusion. The dominant part solid nodule laterally at the right lung apex measures 3.3 x 1.5 cm on image 34/3 (previously 3.1 x 1.4 cm as measured in a similar fashion). As previously noted, this has mildly enlarged from baseline study of 09/05/2020. No increasing focal solid components identified. Several other subcentimeter solid and ground-glass pulmonary nodules bilaterally are unchanged. There are 2 new fiducial markers anteriorly in the right upper lobe, at least 2.5 cm away from the dominant part solid nodule.   Upper abdomen: Redemonstrated are morphologic changes of hepatic cirrhosis. No adrenal mass.   Musculoskeletal/Chest wall: There is no chest wall mass or suspicious osseous finding. Nodular density posteriorly in the left breast is grossly unchanged   IMPRESSION: 1. Imaging for bronchoscopy planning. 2. The known part solid right upper lobe lesion has minimally enlarged from previous study of 4 months ago, although has not developed significant solid components. Of note, the fiducial markers placed at previous bronchoscopy are removed from this dominant lesion. 3. Additional scattered pulmonary nodules are unchanged. 4. Stable asymmetric nodular enlargement of the left thyroid gland, previously evaluated by ultrasound. 5. Hepatic cirrhosis. 6. Coronary and Aortic Atherosclerosis (ICD10-I70.0).     Electronically Signed   By: William  Veazey M.D.   On: 06/15/2021 09:14 I personally reviewed the CT images.  There is a mixed density nodule superior laterally in the right upper lobe that has increased in size over time.  No evidence  of adenopathy.  Pulmonary function testing to 17 2022 FVC 2.09 (65%) FEV1 1.64 (66%) DLCO 26.37 (134%)  Impression: Sonya Walton is a 60-year-old woman with a history of tobacco abuse (quit 2019), hypertension, hyperlipidemia, osteoarthritis, type 2 insulin-dependent diabetes without complication, and a thyroid nodule.    Right upper lobe lung nodule-mixed density nodule superior lateral aspect of right upper lobe.  Has slowly grown in size over the past year.  Minimal activity on PET.  I concur with Dr. 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  We discussed multiple potential options including continued radiographic follow-up, repeat bronchoscopy, CT-guided biopsy, and surgical resection.  Given the nodule has grown over time I do not think continued radiographic follow-up is appropriate, unless she refuses all other options.  She refuses to have a repeat bronchoscopy because of her pneumothorax after the first 1.  I discussed CT-guided biopsy but emphasized to Mrs. Herford and her daughter that there can be the same issues with sampling error.  Finally the most aggressive, but also most definitive option would be to proceed with surgical resection.  That would involve a wedge resection and possible lobectomy if the lesion more cancerous.  She does have adequate pulmonary function to tolerate resection.  I informed her and her daughter of the   general nature of the proposed procedure.  We will plan to do a robotic right upper lobe wedge resection to be followed by lobectomy if the nodule is cancerous.  Given the size of the lesion I think will be difficult to get an adequate margin with just a wedge resection.  I informed them of the need for general anesthesia, the incisions to be used, the use of drains to postoperatively, the expected hospital stay, and the overall recovery.  I informed them of the indications, risks, benefits, and alternatives.  They understand the  risks include, but not limited to death, MI, DVT, PE, bleeding, possible need for transfusion, infection, prolonged air leak, cardiac arrhythmias, as well as possibility of other unforeseeable complications.  She is undecided as to how she would like to proceed.  If she would like to proceed with surgery she can just call and we will schedule it.  If she would like to come back for further discussion with either myself or Dr. Byrum we can arrange that.  We also could arrange an appointment with a radiation oncologist if she would like to talk to them before making a decision.  Plan: She wishes to think over her options Will return in 2 weeks to further discuss possible surgical resection.  Zollie Clemence C Dmetrius Ambs, MD [facility] [phone]  She said if she did anything more aggressive then continued follow-up she would like to wait until after the holidays.  I encouraged her to consider doing something sooner than that.  If she does choose to delay intervention she would need another CT in about 3 months.  Shirlene Andaya C. Arushi Partridge, MD [facility] [phone]   

## 2021-07-29 ENCOUNTER — Encounter: Payer: Self-pay | Admitting: *Deleted

## 2021-07-29 ENCOUNTER — Ambulatory Visit (INDEPENDENT_AMBULATORY_CARE_PROVIDER_SITE_OTHER): Payer: 59 | Admitting: Gastroenterology

## 2021-07-29 ENCOUNTER — Other Ambulatory Visit: Payer: Self-pay

## 2021-07-29 ENCOUNTER — Other Ambulatory Visit: Payer: Self-pay | Admitting: *Deleted

## 2021-07-29 DIAGNOSIS — R918 Other nonspecific abnormal finding of lung field: Secondary | ICD-10-CM

## 2021-07-29 DIAGNOSIS — Z23 Encounter for immunization: Secondary | ICD-10-CM | POA: Diagnosis not present

## 2021-07-30 ENCOUNTER — Telehealth: Payer: Self-pay | Admitting: Student-PharmD

## 2021-07-30 DIAGNOSIS — E1165 Type 2 diabetes mellitus with hyperglycemia: Secondary | ICD-10-CM

## 2021-07-30 MED ORDER — DULAGLUTIDE 3 MG/0.5ML ~~LOC~~ SOAJ: 3.0000 mg | SUBCUTANEOUS | 11 refills | Status: DC

## 2021-07-30 NOTE — Progress Notes (Signed)
Patient presents for vaccine.  Please see nursing notes for details.

## 2021-07-30 NOTE — Telephone Encounter (Signed)
Received fax from [facility] that patient is out of refills for Trulicity 3 mg (she receives through the [employer]). Her last A1c 06/04/21 was up from previous, to 9.0. PCP added Jardiance 10 mg daily and increased Basaglar from 16 to 25 units daily.  Called patient to see how she is doing with these changes. If BG were still elevated, could increase Trulicity to max dose of 4.5 mg weekly. She reports she has been doing really well since the changes made at her last PCP visit. Fasting BG range from 98 to 133, but she has only seen numbers above 130 a couple of times. Most are 110 or 115. She is very pleased with her improved BG. She reports no issues with Jardiance or increased dose of Basaglar. No AE with Trulicity. No BG less than 80 and no signs of hypoglycemia. She is working on eating better - watching carbs, decreasing potatoes, all the "white foods that are good but not good for you."   Encouraged her to keep this up, and to let us know if she experiences any low BG or symptoms of hypoglycemia before her next visit as we could decrease her insulin if needed. Given good BG control, will continue current dose of Trulicity 3 mg weekly and send in refills to [facility].

## 2021-07-30 NOTE — Telephone Encounter (Signed)
Discussed, reviewed and agree with plan.

## 2021-08-03 ENCOUNTER — Other Ambulatory Visit: Payer: Self-pay

## 2021-08-03 ENCOUNTER — Encounter: Payer: Self-pay | Admitting: Gastroenterology

## 2021-08-03 ENCOUNTER — Ambulatory Visit (AMBULATORY_SURGERY_CENTER): Payer: PRIVATE HEALTH INSURANCE | Admitting: Gastroenterology

## 2021-08-03 VITALS — BP 125/64 | HR 84 | Temp 98.2°F | Resp 20 | Ht 63.0 in | Wt 215.0 lb

## 2021-08-03 DIAGNOSIS — K635 Polyp of colon: Secondary | ICD-10-CM | POA: Diagnosis not present

## 2021-08-03 DIAGNOSIS — K5520 Angiodysplasia of colon without hemorrhage: Secondary | ICD-10-CM

## 2021-08-03 DIAGNOSIS — K766 Portal hypertension: Secondary | ICD-10-CM

## 2021-08-03 DIAGNOSIS — B9681 Helicobacter pylori [H. pylori] as the cause of diseases classified elsewhere: Secondary | ICD-10-CM | POA: Diagnosis not present

## 2021-08-03 DIAGNOSIS — D128 Benign neoplasm of rectum: Secondary | ICD-10-CM

## 2021-08-03 DIAGNOSIS — K5730 Diverticulosis of large intestine without perforation or abscess without bleeding: Secondary | ICD-10-CM

## 2021-08-03 DIAGNOSIS — Z86010 Personal history of colonic polyps: Secondary | ICD-10-CM

## 2021-08-03 DIAGNOSIS — K2970 Gastritis, unspecified, without bleeding: Secondary | ICD-10-CM | POA: Diagnosis not present

## 2021-08-03 DIAGNOSIS — D125 Benign neoplasm of sigmoid colon: Secondary | ICD-10-CM

## 2021-08-03 DIAGNOSIS — K319 Disease of stomach and duodenum, unspecified: Secondary | ICD-10-CM | POA: Diagnosis not present

## 2021-08-03 DIAGNOSIS — I8500 Esophageal varices without bleeding: Secondary | ICD-10-CM | POA: Diagnosis not present

## 2021-08-03 DIAGNOSIS — R1012 Left upper quadrant pain: Secondary | ICD-10-CM

## 2021-08-03 MED ORDER — SODIUM CHLORIDE 0.9 % IV SOLN: 500.0000 mL | Freq: Once | INTRAVENOUS | Status: DC

## 2021-08-03 NOTE — Patient Instructions (Signed)
Please read handouts provided. Continue present medications. Await pathology results. Return to GI office as needed.   YOU HAD AN ENDOSCOPIC PROCEDURE TODAY AT [facility]:   Refer to the procedure report that was given to you for any specific questions about what was found during the examination.  If the procedure report does not answer your questions, please call your gastroenterologist to clarify.  If you requested that your care partner not be given the details of your procedure findings, then the procedure report has been included in a sealed envelope for you to review at your convenience later.  YOU SHOULD EXPECT: Some feelings of bloating in the abdomen. Passage of more gas than usual.  Walking can help get rid of the air that was put into your GI tract during the procedure and reduce the bloating. If you had a lower endoscopy (such as a colonoscopy or flexible sigmoidoscopy) you may notice spotting of blood in your stool or on the toilet paper. If you underwent a bowel prep for your procedure, you may not have a normal bowel movement for a few days.  Please Note:  You might notice some irritation and congestion in your nose or some drainage.  This is from the oxygen used during your procedure.  There is no need for concern and it should clear up in a day or so.  SYMPTOMS TO REPORT IMMEDIATELY:  Following lower endoscopy (colonoscopy or flexible sigmoidoscopy):  Excessive amounts of blood in the stool  Significant tenderness or worsening of abdominal pains  Swelling of the abdomen that is new, acute  Fever of 100F or higher  Following upper endoscopy (EGD)  Vomiting of blood or coffee ground material  New chest pain or pain under the shoulder blades  Painful or persistently difficult swallowing  New shortness of breath  Fever of 100F or higher  Black, tarry-looking stools  For urgent or emergent issues, a gastroenterologist can be reached at any hour by calling  [phone]. Do not use MyChart messaging for urgent concerns.    DIET:  We do recommend a small meal at first, but then you may proceed to your regular diet.  Drink plenty of fluids but you should avoid alcoholic beverages for 24 hours.  ACTIVITY:  You should plan to take it easy for the rest of today and you should NOT DRIVE or use heavy machinery until tomorrow (because of the sedation medicines used during the test).    FOLLOW UP: Our staff will call the number listed on your records 48-72 hours following your procedure to check on you and address any questions or concerns that you may have regarding the information given to you following your procedure. If we do not reach you, we will leave a message.  We will attempt to reach you two times.  During this call, we will ask if you have developed any symptoms of COVID 19. If you develop any symptoms (ie: fever, flu-like symptoms, shortness of breath, cough etc.) before then, please call [phone].  If you test positive for Covid 19 in the 2 weeks post procedure, please call and report this information to [place].    If any biopsies were taken you will be contacted by phone or by letter within the next 1-3 weeks.  Please call us at [phone] if you have not heard about the biopsies in 3 weeks.    SIGNATURES/CONFIDENTIALITY: You and/or your care partner have signed paperwork which will be entered into your  electronic medical record.  These signatures attest to the fact that that the information above on your After Visit Summary has been reviewed and is understood.  Full responsibility of the confidentiality of this discharge information lies with you and/or your care-partner.

## 2021-08-03 NOTE — Progress Notes (Signed)
GASTROENTEROLOGY PROCEDURE H&P NOTE   Primary Care Physician: Lattie Haw, MD    Reason for Procedure:   LUQ pain, esophageal varices screening, history of colon polyps  Plan:    EGD, colonoscopy  Patient is appropriate for endoscopic procedure(s) in the ambulatory ([place]) setting.  The nature of the procedure, as well as the risks, benefits, and alternatives were carefully and thoroughly reviewed with the patient. Ample time for discussion and questions allowed. The patient understood, was satisfied, and agreed to proceed.     HPI: Sonya Walton is a 60 y.o. female who presents for EGD and colonoscopy for evaluation of LUQ pain and for ongoing polyp surveillance along with evaluation for esophageal varices screening due to recent CT and ultrasound suggesting cirrhotic appearing liver. INR 1.2, but extended serologic w/u otherwise unrevealing.   Endoscopic History: - Colonoscopy (2008): Single polyp per patient.  No report available for review - Colonoscopy (05/2020): Tortuous colon.  10 subcentimeter tubular adenomas and sessile serrated polyps.  Multiple rectal and rectosigmoid hyperplastic polyps.  Sigmoid diverticulosis, nodular ICV (path: Benign), Ascending colon AVM.  Normal TI.  Recommended repeat in 1 year  Past Medical History:  Diagnosis Date   Allergy    Arthritis    Diabetes mellitus without complication ([place])    Hoarse voice quality 01/19/2016   Hypertension    states under control with med., has been on med. since 2011   Immature cataract    Osteoarthritis 12/2015   right AC joint   Partial tear of right rotator cuff 12/2015   Wears dentures    upper    Past Surgical History:  Procedure Laterality Date   BREAST BIOPSY Left    BRONCHIAL BIOPSY  03/09/2021   Procedure: BRONCHIAL BIOPSIES;  Surgeon: Collene Gobble, MD;  Location: [facility] ENDOSCOPY;  Service: Pulmonary;;   BRONCHIAL BRUSHINGS  03/09/2021   Procedure: BRONCHIAL BRUSHINGS;  Surgeon: Collene Gobble,  MD;  Location: [facility];  Service: Pulmonary;;   BRONCHIAL NEEDLE ASPIRATION BIOPSY  03/09/2021   Procedure: BRONCHIAL NEEDLE ASPIRATION BIOPSIES;  Surgeon: Collene Gobble, MD;  Location: [facility] ENDOSCOPY;  Service: Pulmonary;;   BUNIONECTOMY Bilateral 1994   CHOLECYSTECTOMY  01/17/2012   Procedure: LAPAROSCOPIC CHOLECYSTECTOMY;  Surgeon: Harl Bowie, MD;  Location: [place];  Service: General;  Laterality: N/A;   FIDUCIAL MARKER PLACEMENT  03/09/2021   Procedure: FIDUCIAL MARKER PLACEMENT;  Surgeon: Collene Gobble, MD;  Location: [facility] ENDOSCOPY;  Service: Pulmonary;;   PUBOVAGINAL SLING  03/05/2003   SHOULDER ARTHROSCOPY WITH DISTAL CLAVICLE RESECTION Right 01/26/2016   Procedure: SHOULDER ARTHROSCOPY WITH DISTAL CLAVICLE RESECTION;  Surgeon: Tania Ade, MD;  Location: [place];  Service: Orthopedics;  Laterality: Right;   SHOULDER ARTHROSCOPY WITH SUBACROMIAL DECOMPRESSION Right 01/26/2016   Procedure: SHOULDER ARTHROSCOPY WITH SUBACROMIAL DECOMPRESSION DEBRIDEMENT;  Surgeon: Tania Ade, MD;  Location: [place];  Service: Orthopedics;  Laterality: Right;   TONSILLECTOMY  1975   TOTAL VAGINAL HYSTERECTOMY  03/05/2003   TUBAL LIGATION  1981   VAGINAL HYSTERECTOMY  2004   partial    VIDEO BRONCHOSCOPY WITH ENDOBRONCHIAL NAVIGATION N/A 03/09/2021   Procedure: VIDEO BRONCHOSCOPY WITH ENDOBRONCHIAL NAVIGATION;  Surgeon: Collene Gobble, MD;  Location: [place] ENDOSCOPY;  Service: Pulmonary;  Laterality: N/A;    Prior to Admission medications   Medication Sig Start Date End Date Taking? Authorizing Provider  acetaminophen (TYLENOL) 500 MG tablet Take 1,000 mg by mouth every 6 (six) hours as needed for mild  pain or moderate pain.   Yes [provider]  aspirin EC 81 MG tablet Take 1 tablet (81 mg total) by mouth daily. Swallow whole. 07/03/21  Yes Loel Dubonnet, NP  atenolol-chlorthalidone (TENORETIC) 50-25 MG tablet TAKE 1 TABLET BY MOUTH EVERY  DAY Patient taking differently: Take 1 tablet by mouth daily. 12/12/20  Yes Lattie Haw, MD  blood glucose meter kit and supplies KIT Inject 1 each into the skin as directed. Use meter once daily to measure blood glucose. 07/20/19  Yes Wilber Oliphant, MD  Cholecalciferol (VITAMIN D3) 2000 UNITS TABS Take 2,000 Units by mouth every morning.   Yes [provider]  Dulaglutide 3 MG/0.5ML SOPN Inject 3 mg into the skin once a week. 07/30/21  Yes Hensel, Jamal Collin, MD  glucose blood ([facility] ADVANCED GLUCOSE TEST) test strip Please specify directions, refills and quantity\ Once daily, [id number], 3 refills, 100 quantity Dr Lattie Haw MD 11/13/19  Yes Lattie Haw, MD  Insulin Glargine (BASAGLAR KWIKPEN) 100 UNIT/ML Inject 16 Units into the skin daily. Patient taking differently: Inject 25 Units into the skin daily. 03/10/21  Yes Collene Gobble, MD  Insulin Pen Needle (PEN NEEDLES) 32G X 6 MM MISC 1 each by Does not apply route daily. 10/23/19  Yes Lattie Haw, MD  ondansetron (ZOFRAN) 4 MG tablet Take 1 tablet (4 mg total) by mouth daily as needed for nausea or vomiting. 03/10/21 03/10/22 Yes Byrum, Rose Fillers, MD  potassium chloride SA (KLOR-CON) 20 MEQ tablet Take 2 tablets (40 mEq total) by mouth daily. 02/27/21  Yes Freada Bergeron, MD  RELION PEN NEEDLES 32G X 4 MM MISC USE 1 STRIP TO CHECK GLUCOSE ONCE DAILY 06/08/21  Yes Lattie Haw, MD  rosuvastatin (CRESTOR) 10 MG tablet Take 1 tablet (10 mg total) by mouth daily. 01/27/21  Yes Freada Bergeron, MD  cetirizine (ZYRTEC) 10 MG tablet Chew 10 mg by mouth daily. Patient not taking: No sig reported    [provider]  nitroGLYCERIN (NITROSTAT) 0.4 MG SL tablet Place 1 tablet (0.4 mg total) under the tongue every 5 (five) minutes as needed for chest pain. 12/12/20 07/03/21  Lattie Haw, MD  pantoprazole (PROTONIX) 40 MG tablet Take 1 tablet (40 mg total) by mouth daily at 12 noon. 01/01/12 01/17/12  Charlynne Cousins, MD     Current Outpatient Medications  Medication Sig Dispense Refill   acetaminophen (TYLENOL) 500 MG tablet Take 1,000 mg by mouth every 6 (six) hours as needed for mild pain or moderate pain.     aspirin EC 81 MG tablet Take 1 tablet (81 mg total) by mouth daily. Swallow whole. 90 tablet 3   atenolol-chlorthalidone (TENORETIC) 50-25 MG tablet TAKE 1 TABLET BY MOUTH EVERY DAY (Patient taking differently: Take 1 tablet by mouth daily.) 90 tablet 3   blood glucose meter kit and supplies KIT Inject 1 each into the skin as directed. Use meter once daily to measure blood glucose. 1 each 0   Cholecalciferol (VITAMIN D3) 2000 UNITS TABS Take 2,000 Units by mouth every morning.     Dulaglutide 3 MG/0.5ML SOPN Inject 3 mg into the skin once a week. 2 mL 11   glucose blood ([facility] ADVANCED GLUCOSE TEST) test strip Please specify directions, refills and quantity\ Once daily, [id number], 3 refills, 100 quantity Dr Lattie Haw MD 100 each 0   Insulin Glargine (BASAGLAR KWIKPEN) 100 UNIT/ML Inject 16 Units into the skin daily. (Patient taking differently: Inject 25 Units into  the skin daily.)     Insulin Pen Needle (PEN NEEDLES) 32G X 6 MM MISC 1 each by Does not apply route daily. 50 each 6   ondansetron (ZOFRAN) 4 MG tablet Take 1 tablet (4 mg total) by mouth daily as needed for nausea or vomiting. 30 tablet 0   potassium chloride SA (KLOR-CON) 20 MEQ tablet Take 2 tablets (40 mEq total) by mouth daily. 180 tablet 3   RELION PEN NEEDLES 32G X 4 MM MISC USE 1 STRIP TO CHECK GLUCOSE ONCE DAILY 50 each 0   rosuvastatin (CRESTOR) 10 MG tablet Take 1 tablet (10 mg total) by mouth daily. 90 tablet 3   cetirizine (ZYRTEC) 10 MG tablet Chew 10 mg by mouth daily. (Patient not taking: No sig reported)     nitroGLYCERIN (NITROSTAT) 0.4 MG SL tablet Place 1 tablet (0.4 mg total) under the tongue every 5 (five) minutes as needed for chest pain. 30 tablet 12   Current Facility-Administered Medications  Medication Dose  Route Frequency Provider Last Rate Last Admin   0.9 %  sodium chloride infusion  500 mL Intravenous Once Orbie Grupe V, DO        Allergies as of 08/03/2021 - Review Complete 08/03/2021  Allergen Reaction Noted   Percocet [oxycodone-acetaminophen] Other (See Comments) 01/08/2019    Family History  Problem Relation Age of Onset   Liver cancer Father    Cancer Father        lung and liver   Heart disease Mother    Hyperlipidemia Mother    Hypertension Mother    Heart failure Mother    Hypertension Other    Diabetes Other    Asthma Other    Diabetes type II Sister    Breast cancer Sister    Asthma Sister    COPD Sister    Diabetes type II Brother    Cancer Paternal Aunt        lung   Hypertension Sister    Hypertension Brother    Diabetes Brother    Asthma Brother    Cancer Other        lung   Colon polyps Neg Hx    Esophageal cancer Neg Hx    Rectal cancer Neg Hx    Stomach cancer Neg Hx     Social History   Socioeconomic History   Marital status: Widowed    Spouse name: Not on file   Number of children: Not on file   Years of education: Not on file   Highest education level: 10th grade  Occupational History   Not on file  Tobacco Use   Smoking status: Former    Packs/day: 1.00    Years: 37.00    Pack years: 37.00    Types: Cigarettes    Start date: 11/01/1981    Quit date: 01/09/2018    Years since quitting: 3.5   Smokeless tobacco: Never   Tobacco comments:    1 ppd max use.   Vaping Use   Vaping Use: Never used  Substance and Sexual Activity   Alcohol use: Yes    Alcohol/week: 0.0 standard drinks    Comment: rarely   Drug use: No   Sexual activity: Not Currently    Partners: Male    Birth control/protection: Surgical  Other Topics Concern   Not on file  Social History Narrative   Previous [facility] patient, last seen 05/15/12      Works fulltime at [employer] at [employer] (  [employer]).  Completed some high school   Currently  separated   Lives with her ssiter, Verdene Lennert, [place]            Social Determinants of Health   Financial Resource Strain: Not on file  Food Insecurity: Not on file  Transportation Needs: Not on file  Physical Activity: Not on file  Stress: Not on file  Social Connections: Not on file  Intimate Partner Violence: Not on file    Physical Exam: Vital signs in last 24 hours: _0  (!) 153/92 (BP Location: Right Arm, Patient Position: Sitting, Cuff Size: Normal)   Pulse 87   Temp 98.2 F (36.8 C) (Temporal)   Ht _1  (1.6 m)   Wt 215 lb (97.5 kg)   SpO2 94%   BMI 38.09 kg/m  GEN: NAD EYE: Sclerae anicteric ENT: MMM CV: Non-tachycardic Pulm: CTA b/l GI: Soft, NT/ND NEURO:  Alert & Oriented x 3   Gerrit Heck, DO [facility]   08/03/2021 10:05 AM

## 2021-08-03 NOTE — Op Note (Signed)
[place] Patient Name: Sonya Walton Procedure Date: 08/03/2021 10:07 AM MRN: [id number] Endoscopist: Gerrit Heck , MD Age: 60 Referring MD:  Date of Birth: 1961-10-31 Gender: Female Account #: [id number] Procedure:                Colonoscopy Indications:              Surveillance: History of numerous (> 10) adenomas                            on last colonoscopy (< 3 yrs)                           - Colonoscopy (05/2020): Tortuous colon. 10                            subcentimeter tubular adenomas and sessile serrated                            polyps. Multiple rectal and rectosigmoid                            hyperplastic polyps. Sigmoid diverticulosis,                            nodular ICV (path: Benign), Ascending colon AVM.                            Normal TI. Recommended repeat in 1 year Medicines:                Monitored Anesthesia Care Procedure:                Pre-Anesthesia Assessment:                           - Prior to the procedure, a History and Physical                            was performed, and patient medications and                            allergies were reviewed. The patient's tolerance of                            previous anesthesia was also reviewed. The risks                            and benefits of the procedure and the sedation                            options and risks were discussed with the patient.                            All questions were answered, and informed consent  was obtained. Prior Anticoagulants: The patient has                            taken no previous anticoagulant or antiplatelet                            agents. ASA Grade Assessment: III - A patient with                            severe systemic disease. After reviewing the risks                            and benefits, the patient was deemed in                            satisfactory condition to undergo the  procedure.                           After obtaining informed consent, the colonoscope                            was passed under direct vision. Throughout the                            procedure, the patient's blood pressure, pulse, and                            oxygen saturations were monitored continuously. The                            CF [id number] #[id number] was introduced through the anus                            and advanced to the the terminal ileum. The                            colonoscopy was technically difficult and complex                            due to a tortuous colon. The patient tolerated the                            procedure well. The quality of the bowel                            preparation was good. The terminal ileum, ileocecal                            valve, appendiceal orifice, and rectum were                            photographed. Scope In: 10:25:33 AM Scope Out: 10:50:49 AM Scope Withdrawal Time: 0 hours 17 minutes 49 seconds  Total Procedure Duration:  0 hours 25 minutes 16 seconds  Findings:                 The perianal and digital rectal examinations were                            normal.                           Two sessile polyps were found in the sigmoid colon.                            The polyps were 3 to 4 mm in size. These polyps                            were removed with a cold snare. Resection and                            retrieval were complete. Estimated blood loss was                            minimal.                           A few sessile polyps were found in the rectum. The                            polyps were 2 to 3 mm in size. Several of these                            polyps were removed with a cold snare for                            histologic representative evaluation. Resection and                            retrieval were complete. Estimated blood loss was                            minimal.                            Multiple small and large-mouthed diverticula were                            found in the sigmoid colon, transverse colon and                            ascending colon.                           The sigmoid colon was hypertrophied and                            significantly tortuous with acute angulation.  Advancing the scope required using manual pressure.                           The retroflexed view of the distal rectum and anal                            verge was normal and showed no anal or rectal                            abnormalities.                           A single small angioectasia without bleeding was                            found in the ascending colon.                           The terminal ileum appeared normal. Complications:            No immediate complications. Estimated Blood Loss:     Estimated blood loss was minimal. Impression:               - Two 3 to 4 mm polyps in the sigmoid colon,                            removed with a cold snare. Resected and retrieved.                           - A few 2 to 3 mm polyps in the rectum, removed                            with a cold snare. Resected and retrieved.                           - Diverticulosis in the sigmoid colon, in the                            transverse colon and in the ascending colon.                           - Tortuous colon.                           - The distal rectum and anal verge are normal on                            retroflexion view.                           - A single non-bleeding colonic angioectasia.                           - The examined portion of the ileum was normal. Recommendation:           -  Patient has a contact number available for                            emergencies. The signs and symptoms of potential                            delayed complications were discussed with the                            patient. Return to normal  activities tomorrow.                            Written discharge instructions were provided to the                            patient.                           - Resume previous diet.                           - Continue present medications.                           - Await pathology results.                           - Repeat colonoscopy in 3 - 5 years for                            surveillance based on pathology results.                           - Return to GI office PRN. Gerrit Heck, MD 08/03/2021 11:10:01 AM

## 2021-08-03 NOTE — Progress Notes (Signed)
Vitals-DT  History reviewed. 

## 2021-08-03 NOTE — Progress Notes (Signed)
Called to room to assist during endoscopic procedure.  Patient ID and intended procedure confirmed with present staff. Received instructions for my participation in the procedure from the performing physician.  

## 2021-08-03 NOTE — Progress Notes (Signed)
PT taken to PACU. Monitors in place. VSS. Report given to RN. 

## 2021-08-03 NOTE — Op Note (Signed)
[place] Patient Name: Sonya Walton Procedure Date: 08/03/2021 10:07 AM MRN: [id number] Endoscopist: Gerrit Heck , MD Age: 60 Referring MD:  Date of Birth: 09-04-1961 Gender: Female Account #: [id number] Procedure:                Upper GI endoscopy Indications:              Abdominal pain in the left upper quadrant                           Cirrhotic appearing liver on CT and ultrasound,                            presenting today to rule out esophageal varices and                            portal hypertensive changes. Medicines:                Monitored Anesthesia Care Procedure:                Pre-Anesthesia Assessment:                           - Prior to the procedure, a History and Physical                            was performed, and patient medications and                            allergies were reviewed. The patient's tolerance of                            previous anesthesia was also reviewed. The risks                            and benefits of the procedure and the sedation                            options and risks were discussed with the patient.                            All questions were answered, and informed consent                            was obtained. Prior Anticoagulants: The patient has                            taken no previous anticoagulant or antiplatelet                            agents. ASA Grade Assessment: III - A patient with                            severe systemic disease. After reviewing the risks  and benefits, the patient was deemed in                            satisfactory condition to undergo the procedure.                           After obtaining informed consent, the endoscope was                            passed under direct vision. Throughout the                            procedure, the patient's blood pressure, pulse, and                            oxygen saturations were  monitored continuously. The                            GIF [id number] #[id number] was introduced through the                            mouth, and advanced to the second part of duodenum.                            The upper GI endoscopy was accomplished without                            difficulty. The patient tolerated the procedure                            well. Scope In: Scope Out: Findings:                 Grade I varices were found in the lower third of                            the esophagus. They were small in size and                            flattened with insufflation.                           The Z-line was regular and was found 38 cm from the                            incisors.                           Mild portal hypertensive gastropathy was found in                            the entire examined stomach. Biopsies were taken                            with a cold forceps for histology.  Estimated blood                            loss was minimal.                           The examined duodenum was normal. Complications:            No immediate complications. Estimated Blood Loss:     Estimated blood loss was minimal. Impression:               - Grade I esophageal varices.                           - Z-line regular, 38 cm from the incisors.                           - Portal hypertensive gastropathy. Biopsied.                           - Normal examined duodenum. Recommendation:           - Patient has a contact number available for                            emergencies. The signs and symptoms of potential                            delayed complications were discussed with the                            patient. Return to normal activities tomorrow.                            Written discharge instructions were provided to the                            patient.                           - Resume previous diet.                           - Continue present medications.                            - Await pathology results.                           - Can consider repeat EGD in 2 years for ongoing                            variceal surveillance.                           - Colonoscopy today. Gerrit Heck, MD 08/03/2021 11:03:48 AM

## 2021-08-05 ENCOUNTER — Telehealth: Payer: Self-pay

## 2021-08-05 NOTE — Telephone Encounter (Signed)
  Follow up Call-  Call back number 08/03/2021 05/21/2020  Post procedure Call Back phone  # [phone]  Permission to leave phone message Yes Yes  Some recent data might be hidden     Patient questions:  Do you have a fever, pain , or abdominal swelling? No. Pain Score  0 *  Have you tolerated food without any problems? Yes.    Have you been able to return to your normal activities? Yes.    Do you have any questions about your discharge instructions: Diet   No. Medications  No. Follow up visit  No.  Do you have questions or concerns about your Care? No.  Actions: * If pain score is 4 or above: No action needed, pain <4.

## 2021-08-11 ENCOUNTER — Telehealth: Payer: Self-pay | Admitting: Gastroenterology

## 2021-08-11 NOTE — Progress Notes (Signed)
Surgical Instructions    Your procedure is scheduled on October 14th Friday.  Report to [facility] Main Entrance "A" at 5:30 A.M., then check in with the Admitting office.  Call this number if you have problems the morning of surgery:  [phone]   If you have any questions prior to your surgery date call [phone]: Open Monday-Friday 8am-4pm    Remember:  Do not eat or drink after midnight the night before your surgery   Take these medicines the morning of surgery with A SIP OF WATER potassium chloride SA (KLOR-CON) 20 MEQ tablet rosuvastatin (CRESTOR) 10 MG tablet  IF NEEDED  acetaminophen (TYLENOL) 500 MG tablet cetirizine (ZYRTEC) 10 MG tablet nitroGLYCERIN (NITROSTAT) 0.4 MG SL tablet ondansetron (ZOFRAN) 4 MG tablet    Follow your surgeon's instructions on when to stop Aspirin.  If no instructions were given by your surgeon then you will need to call the office to get those instructions.    As of today, STOP taking any Aspirin (unless otherwise instructed by your surgeon) Aleve, Naproxen, Ibuprofen, Motrin, Advil, Goody's, BC's, all herbal medications, fish oil, and all vitamins.  WHAT DO I DO ABOUT MY DIABETES MEDICATION?   Do not take oral diabetes medicines (JARDIANCE)) the morning of surgery.        Do not take the oral diabetes medicines (JARDIANCE)) the day before surgery 08/13/21  THE NIGHT BEFORE SURGERY, take ____8______ units of ___Insulin Glargine [facility]) ________insulin.      THE DAY OF SURGERY, take ____8______ units of ___Insulin Glargine [facility]) ________insulin.   THE MORNING OF SURGERY, DO NOT take Dulaglutide insulin.  The day of surgery, do not take other diabetes injectables, including Byetta (exenatide), Bydureon (exenatide ER), Victoza (liraglutide), or Trulicity (dulaglutide).   HOW TO MANAGE YOUR DIABETES BEFORE AND AFTER SURGERY  Why is it important to control my blood sugar before and after surgery? Improving  blood sugar levels before and after surgery helps healing and can limit problems. A way of improving blood sugar control is eating a healthy diet by:  Eating less sugar and carbohydrates  Increasing activity/exercise  Talking with your doctor about reaching your blood sugar goals High blood sugars (greater than 180 mg/dL) can raise your risk of infections and slow your recovery, so you will need to focus on controlling your diabetes during the weeks before surgery. Make sure that the doctor who takes care of your diabetes knows about your planned surgery including the date and location.  How do I manage my blood sugar before surgery? Check your blood sugar at least 4 times a day, starting 2 days before surgery, to make sure that the level is not too high or low.  Check your blood sugar the morning of your surgery when you wake up and every 2 hours until you get to the Short Stay unit.  If your blood sugar is less than 70 mg/dL, you will need to treat for low blood sugar: Do not take insulin. Treat a low blood sugar (less than 70 mg/dL) with  cup of clear juice (cranberry or apple), 4 glucose tablets, OR glucose gel. Recheck blood sugar in 15 minutes after treatment (to make sure it is greater than 70 mg/dL). If your blood sugar is not greater than 70 mg/dL on recheck, call [phone] for further instructions. Report your blood sugar to the short stay nurse when you get to Short Stay.  If you are admitted to the hospital after surgery: Your blood sugar will  be checked by the staff and you will probably be given insulin after surgery (instead of oral diabetes medicines) to make sure you have good blood sugar levels. The goal for blood sugar control after surgery is 80-180 mg/dL.    After your COVID test   You are not required to quarantine however you are required to wear a well-fitting mask when you are out and around people not in your household.  If your mask becomes wet or soiled,  replace with a new one.  Wash your hands often with soap and water for 20 seconds or clean your hands with an alcohol-based hand sanitizer that contains at least 60% alcohol.  Do not share personal items.  Notify your provider: if you are in close contact with someone who has COVID  or if you develop a fever of 100.4 or greater, sneezing, cough, sore throat, shortness of breath or body aches.             Do not wear jewelry or makeup Do not wear lotions, powders, perfumes, or deodorant. Do not shave 48 hours prior to surgery.   Do not bring valuables to the hospital. DO Not wear nail polish, gel polish, artificial nails, or any other type of covering on natural nails including finger and toenails. If patients have artificial nails, gel coating, etc. that need to be removed by a nail salon, please have this removed prior to surgery or surgery may need to be canceled/delayed if the surgeon/ anesthesia feels like the patient is unable to be adequately monitored.             [facility] is not responsible for any belongings or valuables.  Do NOT Smoke (Tobacco/Vaping)  24 hours prior to your procedure  If you use a CPAP at night, you may bring your mask for your overnight stay.   Contacts, glasses, hearing aids, dentures or partials may not be worn into surgery, please bring cases for these belongings   For patients admitted to the hospital, discharge time will be determined by your treatment team.   Patients discharged the day of surgery will not be allowed to drive home, and someone needs to stay with them for 24 hours.  NO VISITORS WILL BE ALLOWED IN PRE-OP WHERE PATIENTS ARE PREPPED FOR SURGERY.  ONLY 1 SUPPORT PERSON MAY BE PRESENT IN THE WAITING ROOM WHILE YOU ARE IN SURGERY.  IF YOU ARE TO BE ADMITTED, ONCE YOU ARE IN YOUR ROOM YOU WILL BE ALLOWED TWO (2) VISITORS. 1 (ONE) VISITOR MAY STAY OVERNIGHT BUT MUST ARRIVE TO THE ROOM BY 8pm.  Minor children may have two parents present.  Special consideration for safety and communication needs will be reviewed on a case by case basis.  Special instructions:    Oral Hygiene is also important to reduce your risk of infection.  Remember - BRUSH YOUR TEETH THE MORNING OF SURGERY WITH YOUR REGULAR TOOTHPASTE   [facility]- Preparing For Surgery  Before surgery, you can play an important role. Because skin is not sterile, your skin needs to be as free of germs as possible. You can reduce the number of germs on your skin by washing with CHG (chlorahexidine gluconate) Soap before surgery.  CHG is an antiseptic cleaner which kills germs and bonds with the skin to continue killing germs even after washing.     Please do not use if you have an allergy to CHG or antibacterial soaps. If your skin becomes reddened/irritated stop using the  CHG.  Do not shave (including legs and underarms) for at least 48 hours prior to first CHG shower. It is OK to shave your face.  Please follow these instructions carefully.     Shower the NIGHT BEFORE SURGERY and the MORNING OF SURGERY with CHG Soap.   If you chose to wash your hair, wash your hair first as usual with your normal shampoo. After you shampoo, rinse your hair and body thoroughly to remove the shampoo.  Then [employer] and genitals (private parts) with your normal soap and rinse thoroughly to remove soap.  After that Use CHG Soap as you would any other liquid soap. You can apply CHG directly to the skin and wash gently with a scrungie or a clean washcloth.   Apply the CHG Soap to your body ONLY FROM THE NECK DOWN.  Do not use on open wounds or open sores. Avoid contact with your eyes, ears, mouth and genitals (private parts). Wash Face and genitals (private parts)  with your normal soap.   Wash thoroughly, paying special attention to the area where your surgery will be performed.  Thoroughly rinse your body with warm water from the neck down.  DO NOT shower/wash with your normal soap  after using and rinsing off the CHG Soap.  Pat yourself dry with a CLEAN TOWEL.  Wear CLEAN PAJAMAS to bed the night before surgery  Place CLEAN SHEETS on your bed the night before your surgery  DO NOT SLEEP WITH PETS.   Day of Surgery:  Take a shower with CHG soap. Wear Clean/Comfortable clothing the morning of surgery Do not apply any deodorants/lotions.   Remember to brush your teeth WITH YOUR REGULAR TOOTHPASTE.   Please read over the following fact sheets that you were given.

## 2021-08-11 NOTE — Telephone Encounter (Signed)
Pt called inquiring about path results. Pls call her when they are ready.

## 2021-08-12 ENCOUNTER — Telehealth: Payer: Self-pay | Admitting: General Surgery

## 2021-08-12 ENCOUNTER — Encounter (HOSPITAL_COMMUNITY)
Admission: RE | Admit: 2021-08-12 | Discharge: 2021-08-12 | Disposition: A | Discharge status: Home / Self Care | Payer: PRIVATE HEALTH INSURANCE | Source: Ambulatory Visit | Attending: Thoracic Surgery (Cardiothoracic Vascular Surgery) | Admitting: Thoracic Surgery (Cardiothoracic Vascular Surgery)

## 2021-08-12 ENCOUNTER — Ambulatory Visit (HOSPITAL_COMMUNITY)
Admission: RE | Admit: 2021-08-12 | Discharge: 2021-08-12 | Disposition: A | Discharge status: Home / Self Care | Payer: PRIVATE HEALTH INSURANCE | Source: Ambulatory Visit | Attending: Thoracic Surgery (Cardiothoracic Vascular Surgery) | Admitting: Thoracic Surgery (Cardiothoracic Vascular Surgery)

## 2021-08-12 ENCOUNTER — Other Ambulatory Visit: Payer: Self-pay

## 2021-08-12 ENCOUNTER — Encounter (HOSPITAL_COMMUNITY): Payer: Self-pay

## 2021-08-12 DIAGNOSIS — B9681 Helicobacter pylori [H. pylori] as the cause of diseases classified elsewhere: Secondary | ICD-10-CM

## 2021-08-12 DIAGNOSIS — K2970 Gastritis, unspecified, without bleeding: Secondary | ICD-10-CM

## 2021-08-12 DIAGNOSIS — Z01818 Encounter for other preprocedural examination: Secondary | ICD-10-CM | POA: Insufficient documentation

## 2021-08-12 DIAGNOSIS — R918 Other nonspecific abnormal finding of lung field: Secondary | ICD-10-CM | POA: Insufficient documentation

## 2021-08-12 DIAGNOSIS — Z20822 Contact with and (suspected) exposure to covid-19: Secondary | ICD-10-CM | POA: Insufficient documentation

## 2021-08-12 HISTORY — DX: Atherosclerotic heart disease of native coronary artery without angina pectoris: I25.10

## 2021-08-12 LAB — CBC
HCT: 51.5 % — ABNORMAL HIGH (ref 36.0–46.0)
Hemoglobin: 16.3 g/dL — ABNORMAL HIGH (ref 12.0–15.0)
MCH: 28.5 pg (ref 26.0–34.0)
MCHC: 31.7 g/dL (ref 30.0–36.0)
MCV: 90 fL (ref 80.0–100.0)
Platelets: 242 10*3/uL (ref 150–400)
RBC: 5.72 MIL/uL — ABNORMAL HIGH (ref 3.87–5.11)
RDW: 14.5 % (ref 11.5–15.5)
WBC: 10.1 10*3/uL (ref 4.0–10.5)
nRBC: 0 % (ref 0.0–0.2)

## 2021-08-12 LAB — URINALYSIS, ROUTINE W REFLEX MICROSCOPIC
Bacteria, UA: NONE SEEN
Bilirubin Urine: NEGATIVE
Glucose, UA: >=500 mg/dL — AB
Hgb urine dipstick: NEGATIVE
Ketones, ur: NEGATIVE mg/dL
Leukocytes,Ua: NEGATIVE
Nitrite: NEGATIVE
Protein, ur: NEGATIVE mg/dL
Specific Gravity, Urine: 1.018 (ref 1.005–1.030)
pH: 7 (ref 5.0–8.0)

## 2021-08-12 LAB — COMPREHENSIVE METABOLIC PANEL
ALT: 51 U/L — ABNORMAL HIGH (ref 0–44)
AST: 47 U/L — ABNORMAL HIGH (ref 15–41)
Albumin: 3.7 g/dL (ref 3.5–5.0)
Alkaline Phosphatase: 86 U/L (ref 38–126)
Anion gap: 10 (ref 5–15)
BUN: 9 mg/dL (ref 8–23)
CO2: 26 mmol/L (ref 22–32)
Calcium: 9.5 mg/dL (ref 8.9–10.3)
Chloride: 103 mmol/L (ref 98–111)
Creatinine, Ser: 0.67 mg/dL (ref 0.44–1.00)
GFR, Estimated: >60 mL/min (ref >60)
Glucose, Bld: 102 mg/dL — ABNORMAL HIGH (ref 70–99)
Potassium: 3.1 mmol/L — ABNORMAL LOW (ref 3.5–5.1)
Sodium: 139 mmol/L (ref 135–145)
Total Bilirubin: 0.9 mg/dL (ref 0.3–1.2)
Total Protein: 7.4 g/dL (ref 6.5–8.1)

## 2021-08-12 LAB — GLUCOSE, CAPILLARY: Glucose-Capillary: 116 mg/dL — ABNORMAL HIGH (ref 70–99)

## 2021-08-12 LAB — APTT: aPTT: 31 seconds (ref 24–36)

## 2021-08-12 LAB — PROTIME-INR
INR: 1.2 (ref 0.8–1.2)
Prothrombin Time: 14.8 seconds (ref 11.4–15.2)

## 2021-08-12 LAB — SURGICAL PCR SCREEN
MRSA, PCR: NEGATIVE
Staphylococcus aureus: NEGATIVE

## 2021-08-12 LAB — HEMOGLOBIN A1C
Hgb A1c MFr Bld: 6.8 % — ABNORMAL HIGH (ref 4.8–5.6)
Mean Plasma Glucose: 148.46 mg/dL

## 2021-08-12 LAB — SARS CORONAVIRUS 2 (TAT 6-24 HRS): SARS Coronavirus 2: NEGATIVE

## 2021-08-12 MED ORDER — OMEPRAZOLE 20 MG PO CPDR: 20.0000 mg | DELAYED_RELEASE_CAPSULE | Freq: Two times a day (BID) | ORAL | 0 refills | Status: DC

## 2021-08-12 MED ORDER — BISMUTH SUBSALICYLATE 262 MG PO CHEW: 524.0000 mg | CHEWABLE_TABLET | Freq: Four times a day (QID) | ORAL | 0 refills | Status: AC

## 2021-08-12 MED ORDER — DOXYCYCLINE HYCLATE 100 MG PO CAPS: 100.0000 mg | ORAL_CAPSULE | Freq: Two times a day (BID) | ORAL | 0 refills | Status: AC

## 2021-08-12 MED ORDER — METRONIDAZOLE 250 MG PO TABS: 250.0000 mg | ORAL_TABLET | Freq: Four times a day (QID) | ORAL | 0 refills | Status: AC

## 2021-08-12 NOTE — Telephone Encounter (Signed)
-----   Message from [place], DO sent at 08/12/2021  2:40 PM EDT ----- Results from the recent colonoscopy and upper endoscopy are as follows: - One of the polyps removed from the rectum was a tubular adenoma.  This is considered benign, but precancerous.  This was removed entirely at time of colonoscopy. - The remainder of the polyps were benign Hyperplastic Polyps.  These types of polyps harbor no malignant potential.  -The biopsies from the recent upper GI Endoscopy were notable for H. Pylori gastritis, and will plan on treating with quad therapy as below. Please confirm no medication allergies to the prescribed regimen.   1) Omeprazole 20 mg 2 times a day x 14 d 2) Pepto Bismol 2 tabs (262 mg each) 4 times a day x 14 d 3) Metronidazole 250 mg 4 times a day x 14 d 4) doxycycline 100 mg 2 times a day x 14 d  After 14 days, ok to stop omeprazole.  4 weeks after treatment completed, check H. Pylori stool antigen to confirm eradication (must be off acid suppression therapy)  Dx: H. Pylori gastritis

## 2021-08-12 NOTE — Telephone Encounter (Signed)
Notified the patient of her pathology. Explained to her about h. Pylori and the medication regime she will need to take to erradicate it. The patient verbalized understanding and I explained I would send her a my chart message with all the directions. Orders placed for medication and for stool antigen test

## 2021-08-12 NOTE — Progress Notes (Signed)
PCP - Dr. Lattie Haw Cardiologist - Dr. Shary Key  Chest x-ray - 08/12/21 EKG - 08/12/21 Stress Test - 01/11/12 ECHO - 02/26/21 Cardiac Cath -   Sleep Study - denies CPAP - denies  CBG at PAT: 116 Fasting Blood Sugar - 100-115 Checks Blood Sugar 1 time a day  Blood Thinner Instructions: n/a Aspirin Instructions: LD 08/12/21  COVID TEST- 08/12/21; done in PAT  Anesthesia review: Yes  Patient denies shortness of breath, fever, cough and chest pain at PAT appointment   All instructions explained to the patient, with a verbal understanding of the material. Patient agrees to go over the instructions while at home for a better understanding. Patient also instructed to self quarantine after being tested for COVID-19. The opportunity to ask questions was provided.

## 2021-08-13 ENCOUNTER — Encounter (HOSPITAL_COMMUNITY): Payer: Self-pay

## 2021-08-13 NOTE — Progress Notes (Addendum)
Ms Mabin called to ask about her medications, Insulin and blood pressure medication. Patient takes 25 units of Basaglar in the am-,  I instructed Ms Hinesley to take full dose this am and if CBG > 70 take 12 units of Basaglar insulin. I instructed Ms Manygoats to take Tenoretic today and to not take it in the am.

## 2021-08-13 NOTE — Anesthesia Preprocedure Evaluation (Addendum)
Anesthesia Evaluation  Patient identified by MRN, date of birth, ID band Patient awake    Reviewed: Allergy & Precautions, NPO status , Patient's Chart, lab work & pertinent test results  Airway Mallampati: I  TM Distance: >3 FB Neck ROM: Full    Dental  (+) Edentulous Upper, Missing,    Pulmonary former smoker,    breath sounds clear to auscultation       Cardiovascular hypertension, Pt. on medications + CAD   Rhythm:Regular Rate:Normal  Echo:  1. Left ventricular ejection fraction, by estimation, is 55 to 60%. The  left ventricle has normal function. The left ventricle has no regional  wall motion abnormalities. The left ventricular internal cavity size was  mildly dilated. Left ventricular  diastolic parameters were normal.  2. Right ventricular systolic function is normal. The right ventricular  size is normal. There is normal pulmonary artery systolic pressure.  3. Left atrial size was mildly dilated.  4. The mitral valve is normal in structure. Trivial mitral valve  regurgitation. No evidence of mitral stenosis.  5. The aortic valve is tricuspid. Aortic valve regurgitation is not  visualized. No aortic stenosis is present.   Neuro/Psych    GI/Hepatic Neg liver ROS, GERD  Medicated,  Endo/Other  diabetes, Type 2, Insulin Dependent  Renal/GU negative Renal ROS     Musculoskeletal  (+) Arthritis ,   Abdominal Normal abdominal exam  (+)   Peds  Hematology negative hematology ROS (+)   Anesthesia Other Findings   Reproductive/Obstetrics                         Anesthesia Physical Anesthesia Plan  ASA: 3  Anesthesia Plan: General   Post-op Pain Management:    Induction: Intravenous  PONV Risk Score and Plan: 4 or greater and Ondansetron, Dexamethasone, Midazolam and Scopolamine patch - Pre-op  Airway Management Planned: Oral ETT and Double Lumen EBT  Additional Equipment:  Arterial line, CVP and Ultrasound Guidance Line Placement  Intra-op Plan:   Post-operative Plan: Extubation in OR  Informed Consent: I have reviewed the patients History and Physical, chart, labs and discussed the procedure including the risks, benefits and alternatives for the proposed anesthesia with the patient or authorized representative who has indicated his/her understanding and acceptance.     Dental advisory given  Plan Discussed with: CRNA  Anesthesia Plan Comments: (See APP note by Durel Salts, FNP )      Anesthesia Quick Evaluation

## 2021-08-13 NOTE — Progress Notes (Signed)
Anesthesia Chart Review:   Case: [id number] Date/Time: 08/14/21 0715   Procedure: XI ROBOTIC ASSISTED THORASCOPY-WEDGE RESECTION, possible upper lobectomy (Right: Chest)   Anesthesia type: General   Pre-op diagnosis: RUL NODULE   Location: [facility] OR ROOM 10 / [facility]   Surgeons: [name], MD       DISCUSSION: Pt is 60 years old with hx CAD (mild, nonobstructive by CT), HTN, DM  VS: BP 122/69   Pulse 75   Temp 36.9 C (Oral)   Resp 18   Ht $R'5\' 3"'LC$  (1.6 m)   Wt 95.3 kg   SpO2 95%   BMI 37.22 kg/m   PROVIDERS: - PCP is Lattie Haw, MD - Cardiologist is Gwyndolyn Kaufman, MD. Last office visit 07/03/21 with Laurann Montana, NP   LABS: Labs reviewed: Acceptable for surgery. (all labs ordered are listed, but only abnormal results are displayed)  Labs Reviewed  GLUCOSE, CAPILLARY - Abnormal; Notable for the following components:      Result Value   Glucose-Capillary 116 (*)    All other components within normal limits  CBC - Abnormal; Notable for the following components:   RBC 5.72 (*)    Hemoglobin 16.3 (*)    HCT 51.5 (*)    All other components within normal limits  COMPREHENSIVE METABOLIC PANEL - Abnormal; Notable for the following components:   Potassium 3.1 (*)    Glucose, Bld 102 (*)    AST 47 (*)    ALT 51 (*)    All other components within normal limits  URINALYSIS, ROUTINE W REFLEX MICROSCOPIC - Abnormal; Notable for the following components:   Glucose, UA >=500 (*)    All other components within normal limits  HEMOGLOBIN A1C - Abnormal; Notable for the following components:   Hgb A1c MFr Bld 6.8 (*)    All other components within normal limits  SURGICAL PCR SCREEN  SARS CORONAVIRUS 2 (TAT 6-24 HRS)  PROTIME-INR  APTT  TYPE AND SCREEN     IMAGES: CXR 08/12/21: results pending  CT super D chest 06/13/21:  1. Imaging for bronchoscopy planning. 2. The known part solid right upper lobe lesion has minimally enlarged from previous study of 4 months  ago, although has not developed significant solid components. Of note, the fiducial markers placed at previous bronchoscopy are removed from this dominant lesion. 3. Additional scattered pulmonary nodules are unchanged. 4. Stable asymmetric nodular enlargement of the left thyroid gland, previously evaluated by ultrasound. 5. Hepatic cirrhosis. 6. Coronary and Aortic Atherosclerosis   EKG 08/12/21:  NSR Incomplete right bundle branch block Minimal voltage criteria for LVH, may be normal variant ( Cornell product ) Nonspecific T wave abnormality   CV: Echo 02/26/21:  1. Left ventricular ejection fraction, by estimation, is 55 to 60%. The left ventricle has normal function. The left ventricle has no regional wall motion abnormalities. The left ventricular internal cavity size was mildly dilated. Left ventricular diastolic parameters were normal.   2. Right ventricular systolic function is normal. The right ventricular size is normal. There is normal pulmonary artery systolic pressure.   3. Left atrial size was mildly dilated.   4. The mitral valve is normal in structure. Trivial mitral valve regurgitation. No evidence of mitral stenosis.   5. The aortic valve is tricuspid. Aortic valve regurgitation is not visualized. No aortic stenosis is present.    CT coronary morphology 02/05/21:  1. Mild nonobstructive CAD, CADRADS = 2. Highest concentration of plaque noted in the transition from  the distal left main into the bifurcation of the LAD/LCx, without significant obstruction. 2. Coronary calcium score of 12. This was 99th percentile for age and sex matched control. 3.  Normal coronary origin with right dominance. 4.  Aortic atherosclerosis   Past Medical History:  Diagnosis Date   Allergy    Arthritis    Coronary artery disease    mild by CT 02/05/21   Diabetes mellitus without complication ([place])    Hoarse voice quality 01/19/2016   Hypertension    states under control with med., has  been on med. since 2011   Immature cataract    Osteoarthritis 12/2015   right AC joint   Partial tear of right rotator cuff 12/2015   Thyroid nodule 2021   Wears dentures    upper    Past Surgical History:  Procedure Laterality Date   BREAST BIOPSY Left    BRONCHIAL BIOPSY  03/09/2021   Procedure: BRONCHIAL BIOPSIES;  Surgeon: Collene Gobble, MD;  Location: [facility] ENDOSCOPY;  Service: Pulmonary;;   BRONCHIAL BRUSHINGS  03/09/2021   Procedure: BRONCHIAL BRUSHINGS;  Surgeon: Collene Gobble, MD;  Location: [facility];  Service: Pulmonary;;   BRONCHIAL NEEDLE ASPIRATION BIOPSY  03/09/2021   Procedure: BRONCHIAL NEEDLE ASPIRATION BIOPSIES;  Surgeon: Collene Gobble, MD;  Location: [facility] ENDOSCOPY;  Service: Pulmonary;;   BUNIONECTOMY Bilateral 1994   CHOLECYSTECTOMY  01/17/2012   Procedure: LAPAROSCOPIC CHOLECYSTECTOMY;  Surgeon: Harl Bowie, MD;  Location: [facility];  Service: General;  Laterality: N/A;   FIDUCIAL MARKER PLACEMENT  03/09/2021   Procedure: FIDUCIAL MARKER PLACEMENT;  Surgeon: Collene Gobble, MD;  Location: [facility] ENDOSCOPY;  Service: Pulmonary;;   PUBOVAGINAL SLING  03/05/2003   SHOULDER ARTHROSCOPY WITH DISTAL CLAVICLE RESECTION Right 01/26/2016   Procedure: SHOULDER ARTHROSCOPY WITH DISTAL CLAVICLE RESECTION;  Surgeon: Tania Ade, MD;  Location: [facility] [facility];  Service: Orthopedics;  Laterality: Right;   SHOULDER ARTHROSCOPY WITH SUBACROMIAL DECOMPRESSION Right 01/26/2016   Procedure: SHOULDER ARTHROSCOPY WITH SUBACROMIAL DECOMPRESSION DEBRIDEMENT;  Surgeon: Tania Ade, MD;  Location: [facility];  Service: Orthopedics;  Laterality: Right;   TONSILLECTOMY  1975   TOTAL VAGINAL HYSTERECTOMY  03/05/2003   TUBAL LIGATION  1981   VAGINAL HYSTERECTOMY  2004   partial    VIDEO BRONCHOSCOPY WITH ENDOBRONCHIAL NAVIGATION N/A 03/09/2021   Procedure: VIDEO BRONCHOSCOPY WITH ENDOBRONCHIAL NAVIGATION;  Surgeon: Collene Gobble, MD;  Location: [facility] ENDOSCOPY;   Service: Pulmonary;  Laterality: N/A;    MEDICATIONS:  acetaminophen (TYLENOL) 500 MG tablet   aspirin EC 81 MG tablet   atenolol-chlorthalidone (TENORETIC) 50-25 MG tablet   bismuth subsalicylate (PEPTO-BISMOL) 262 MG chewable tablet   blood glucose meter kit and supplies KIT   cetirizine (ZYRTEC) 10 MG tablet   Cholecalciferol (VITAMIN D3) 2000 UNITS TABS   doxycycline (VIBRAMYCIN) 100 MG capsule   Dulaglutide 3 MG/0.5ML SOPN   empagliflozin (JARDIANCE) 10 MG TABS tablet   glucose blood ([employer] ADVANCED GLUCOSE TEST) test strip   Insulin Glargine (BASAGLAR KWIKPEN) 100 UNIT/ML   Insulin Pen Needle (PEN NEEDLES) 32G X 6 MM MISC   metroNIDAZOLE (FLAGYL) 250 MG tablet   nitroGLYCERIN (NITROSTAT) 0.4 MG SL tablet   omeprazole (PRILOSEC) 20 MG capsule   ondansetron (ZOFRAN) 4 MG tablet   potassium chloride SA (KLOR-CON) 20 MEQ tablet   RELION PEN NEEDLES 32G X 4 MM MISC   rosuvastatin (CRESTOR) 10 MG tablet   No current facility-administered medications for this encounter.  If no changes, I anticipate pt can proceed with surgery as scheduled.   Willeen Cass, PhD, FNP-BC [facility]/Anesthesiology Phone: [phone] 08/13/2021 8:42 AM

## 2021-08-14 ENCOUNTER — Inpatient Hospital Stay (HOSPITAL_COMMUNITY): Payer: PRIVATE HEALTH INSURANCE | Admitting: Emergency Medicine

## 2021-08-14 ENCOUNTER — Inpatient Hospital Stay (HOSPITAL_COMMUNITY)
Admission: RE | Admit: 2021-08-14 | Discharge: 2021-08-19 | DRG: 164 | Disposition: A | Discharge status: Home / Self Care | Payer: PRIVATE HEALTH INSURANCE | Attending: Thoracic Surgery (Cardiothoracic Vascular Surgery) | Admitting: Thoracic Surgery (Cardiothoracic Vascular Surgery)

## 2021-08-14 ENCOUNTER — Inpatient Hospital Stay (HOSPITAL_COMMUNITY): Payer: PRIVATE HEALTH INSURANCE | Admitting: Certified Registered"

## 2021-08-14 ENCOUNTER — Inpatient Hospital Stay (HOSPITAL_COMMUNITY): Payer: PRIVATE HEALTH INSURANCE

## 2021-08-14 ENCOUNTER — Encounter (HOSPITAL_COMMUNITY): Payer: Self-pay | Admitting: Thoracic Surgery (Cardiothoracic Vascular Surgery)

## 2021-08-14 ENCOUNTER — Other Ambulatory Visit: Payer: Self-pay

## 2021-08-14 ENCOUNTER — Encounter (HOSPITAL_COMMUNITY)
Admission: RE | Disposition: A | Discharge status: Home / Self Care | Payer: Self-pay | Source: Home / Self Care | Attending: Thoracic Surgery (Cardiothoracic Vascular Surgery)

## 2021-08-14 DIAGNOSIS — E876 Hypokalemia: Secondary | ICD-10-CM | POA: Diagnosis not present

## 2021-08-14 DIAGNOSIS — E785 Hyperlipidemia, unspecified: Secondary | ICD-10-CM | POA: Diagnosis present

## 2021-08-14 DIAGNOSIS — Z87891 Personal history of nicotine dependence: Secondary | ICD-10-CM

## 2021-08-14 DIAGNOSIS — Z794 Long term (current) use of insulin: Secondary | ICD-10-CM

## 2021-08-14 DIAGNOSIS — Z801 Family history of malignant neoplasm of trachea, bronchus and lung: Secondary | ICD-10-CM

## 2021-08-14 DIAGNOSIS — Z6837 Body mass index (BMI) 37.0-37.9, adult: Secondary | ICD-10-CM | POA: Diagnosis not present

## 2021-08-14 DIAGNOSIS — D72828 Other elevated white blood cell count: Secondary | ICD-10-CM | POA: Diagnosis not present

## 2021-08-14 DIAGNOSIS — I2510 Atherosclerotic heart disease of native coronary artery without angina pectoris: Secondary | ICD-10-CM | POA: Diagnosis present

## 2021-08-14 DIAGNOSIS — Z7982 Long term (current) use of aspirin: Secondary | ICD-10-CM | POA: Diagnosis not present

## 2021-08-14 DIAGNOSIS — Z20822 Contact with and (suspected) exposure to covid-19: Secondary | ICD-10-CM | POA: Diagnosis present

## 2021-08-14 DIAGNOSIS — Z8249 Family history of ischemic heart disease and other diseases of the circulatory system: Secondary | ICD-10-CM | POA: Diagnosis not present

## 2021-08-14 DIAGNOSIS — Z90710 Acquired absence of both cervix and uterus: Secondary | ICD-10-CM

## 2021-08-14 DIAGNOSIS — C3411 Malignant neoplasm of upper lobe, right bronchus or lung: Secondary | ICD-10-CM | POA: Diagnosis not present

## 2021-08-14 DIAGNOSIS — I959 Hypotension, unspecified: Secondary | ICD-10-CM | POA: Diagnosis present

## 2021-08-14 DIAGNOSIS — Z800 Family history of malignant neoplasm of digestive organs: Secondary | ICD-10-CM

## 2021-08-14 DIAGNOSIS — D62 Acute posthemorrhagic anemia: Secondary | ICD-10-CM | POA: Diagnosis not present

## 2021-08-14 DIAGNOSIS — Z902 Acquired absence of lung [part of]: Secondary | ICD-10-CM

## 2021-08-14 DIAGNOSIS — Z79899 Other long term (current) drug therapy: Secondary | ICD-10-CM

## 2021-08-14 DIAGNOSIS — Z885 Allergy status to narcotic agent status: Secondary | ICD-10-CM | POA: Diagnosis not present

## 2021-08-14 DIAGNOSIS — K219 Gastro-esophageal reflux disease without esophagitis: Secondary | ICD-10-CM | POA: Diagnosis present

## 2021-08-14 DIAGNOSIS — I454 Nonspecific intraventricular block: Secondary | ICD-10-CM | POA: Diagnosis present

## 2021-08-14 DIAGNOSIS — E669 Obesity, unspecified: Secondary | ICD-10-CM | POA: Diagnosis present

## 2021-08-14 DIAGNOSIS — E119 Type 2 diabetes mellitus without complications: Secondary | ICD-10-CM | POA: Diagnosis present

## 2021-08-14 DIAGNOSIS — R918 Other nonspecific abnormal finding of lung field: Secondary | ICD-10-CM

## 2021-08-14 DIAGNOSIS — Z833 Family history of diabetes mellitus: Secondary | ICD-10-CM

## 2021-08-14 DIAGNOSIS — Z09 Encounter for follow-up examination after completed treatment for conditions other than malignant neoplasm: Secondary | ICD-10-CM

## 2021-08-14 DIAGNOSIS — Z4682 Encounter for fitting and adjustment of non-vascular catheter: Secondary | ICD-10-CM

## 2021-08-14 HISTORY — PX: OTHER SURGICAL HISTORY: SHX169

## 2021-08-14 HISTORY — PX: NODE DISSECTION: SHX5269

## 2021-08-14 HISTORY — PX: INTERCOSTAL NERVE BLOCK: SHX5021

## 2021-08-14 LAB — PREPARE RBC (CROSSMATCH)

## 2021-08-14 LAB — GLUCOSE, CAPILLARY
Glucose-Capillary: 133 mg/dL — ABNORMAL HIGH (ref 70–99)
Glucose-Capillary: 148 mg/dL — ABNORMAL HIGH (ref 70–99)
Glucose-Capillary: 166 mg/dL — ABNORMAL HIGH (ref 70–99)
Glucose-Capillary: 154 mg/dL — ABNORMAL HIGH (ref 70–99)

## 2021-08-14 LAB — POCT I-STAT 7, (LYTES, BLD GAS, ICA,H+H)
Acid-Base Excess: 1 mmol/L (ref 0.0–2.0)
Bicarbonate: 28.2 mmol/L — ABNORMAL HIGH (ref 20.0–28.0)
Calcium, Ion: 1.23 mmol/L (ref 1.15–1.40)
HCT: 47 % — ABNORMAL HIGH (ref 36.0–46.0)
Hemoglobin: 16 g/dL — ABNORMAL HIGH (ref 12.0–15.0)
O2 Saturation: 100 %
Potassium: 3 mmol/L — ABNORMAL LOW (ref 3.5–5.1)
Sodium: 141 mmol/L (ref 135–145)
TCO2: 30 mmol/L (ref 22–32)
pCO2 arterial: 51.4 mmHg — ABNORMAL HIGH (ref 32.0–48.0)
pH, Arterial: 7.346 — ABNORMAL LOW (ref 7.350–7.450)
pO2, Arterial: 206 mmHg — ABNORMAL HIGH (ref 83.0–108.0)

## 2021-08-14 LAB — CBC
HCT: 40.4 % (ref 36.0–46.0)
Hemoglobin: 13.6 g/dL (ref 12.0–15.0)
MCH: 29.7 pg (ref 26.0–34.0)
MCHC: 33.7 g/dL (ref 30.0–36.0)
MCV: 88.2 fL (ref 80.0–100.0)
Platelets: 214 10*3/uL (ref 150–400)
RBC: 4.58 MIL/uL (ref 3.87–5.11)
RDW: 14.5 % (ref 11.5–15.5)
WBC: 14.8 10*3/uL — ABNORMAL HIGH (ref 4.0–10.5)
nRBC: 0 % (ref 0.0–0.2)

## 2021-08-14 LAB — ABO/RH: ABO/RH(D): B POS

## 2021-08-14 SURGERY — WEDGE RESECTION, LUNG, ROBOT-ASSISTED, THORACOSCOPIC
Anesthesia: General | Site: Chest | Laterality: Right

## 2021-08-14 MED ORDER — HYDROMORPHONE HCL 1 MG/ML IJ SOLN
INTRAMUSCULAR | Status: AC
  Filled 2021-08-14: qty 1

## 2021-08-14 MED ORDER — SODIUM CHLORIDE FLUSH 0.9 % IV SOLN
INTRAVENOUS | Status: DC | PRN
  Administered 2021-08-14: 100 mL

## 2021-08-14 MED ORDER — MIDAZOLAM HCL 2 MG/2ML IJ SOLN
INTRAMUSCULAR | Status: AC
  Filled 2021-08-14: qty 2

## 2021-08-14 MED ORDER — SODIUM CHLORIDE 0.9 % IV SOLN: INTRAVENOUS | Status: DC

## 2021-08-14 MED ORDER — GLYCOPYRROLATE PF 0.2 MG/ML IJ SOSY
PREFILLED_SYRINGE | INTRAMUSCULAR | Status: DC | PRN
  Administered 2021-08-14: .2 mg via INTRAVENOUS

## 2021-08-14 MED ORDER — BUPIVACAINE HCL (PF) 0.5 % IJ SOLN
INTRAMUSCULAR | Status: AC
  Filled 2021-08-14: qty 30

## 2021-08-14 MED ORDER — PANTOPRAZOLE SODIUM 40 MG PO TBEC
40.0000 mg | DELAYED_RELEASE_TABLET | Freq: Two times a day (BID) | ORAL | Status: DC
  Administered 2021-08-14 – 2021-08-19 (×10): 40 mg via ORAL
  Filled 2021-08-14 (×10): qty 1

## 2021-08-14 MED ORDER — MIDAZOLAM HCL 5 MG/5ML IJ SOLN
INTRAMUSCULAR | Status: DC | PRN
Start: 2021-08-14 — End: 2021-08-14
  Administered 2021-08-14: 2 mg via INTRAVENOUS

## 2021-08-14 MED ORDER — CEFAZOLIN SODIUM-DEXTROSE 2-4 GM/100ML-% IV SOLN
2.0000 g | INTRAVENOUS | Status: AC
  Administered 2021-08-14: 2 g via INTRAVENOUS
  Filled 2021-08-14: qty 100

## 2021-08-14 MED ORDER — DEXAMETHASONE SODIUM PHOSPHATE 10 MG/ML IJ SOLN
INTRAMUSCULAR | Status: DC | PRN
  Administered 2021-08-14: 10 mg via INTRAVENOUS

## 2021-08-14 MED ORDER — SUGAMMADEX SODIUM 200 MG/2ML IV SOLN
INTRAVENOUS | Status: DC | PRN
  Administered 2021-08-14: 400 mg via INTRAVENOUS

## 2021-08-14 MED ORDER — LACTATED RINGERS IV SOLN: INTRAVENOUS | Status: DC

## 2021-08-14 MED ORDER — MORPHINE SULFATE (PF) 2 MG/ML IV SOLN
2.0000 mg | INTRAVENOUS | Status: DC | PRN
  Administered 2021-08-15 – 2021-08-16 (×2): 2 mg via INTRAVENOUS
  Filled 2021-08-14 (×2): qty 1

## 2021-08-14 MED ORDER — ACETAMINOPHEN 10 MG/ML IV SOLN
1000.0000 mg | Freq: Once | INTRAVENOUS | Status: DC | PRN
  Administered 2021-08-14: 1000 mg via INTRAVENOUS

## 2021-08-14 MED ORDER — METRONIDAZOLE 500 MG PO TABS
250.0000 mg | ORAL_TABLET | Freq: Four times a day (QID) | ORAL | Status: DC
  Administered 2021-08-14 – 2021-08-19 (×18): 250 mg via ORAL
  Filled 2021-08-14 (×18): qty 1

## 2021-08-14 MED ORDER — ACETAMINOPHEN 160 MG/5ML PO SOLN: 325.0000 mg | ORAL | Status: DC | PRN

## 2021-08-14 MED ORDER — ACETAMINOPHEN 160 MG/5ML PO SOLN
1000.0000 mg | Freq: Four times a day (QID) | ORAL | Status: DC
  Filled 2021-08-14: qty 40.6

## 2021-08-14 MED ORDER — SENNOSIDES-DOCUSATE SODIUM 8.6-50 MG PO TABS
1.0000 | ORAL_TABLET | Freq: Every day | ORAL | Status: DC
  Administered 2021-08-14 – 2021-08-18 (×5): 1 via ORAL
  Filled 2021-08-14 (×5): qty 1

## 2021-08-14 MED ORDER — ONDANSETRON HCL 4 MG/2ML IJ SOLN
INTRAMUSCULAR | Status: DC | PRN
  Administered 2021-08-14: 4 mg via INTRAVENOUS

## 2021-08-14 MED ORDER — ATENOLOL 50 MG PO TABS
50.0000 mg | ORAL_TABLET | Freq: Every day | ORAL | Status: DC
  Administered 2021-08-15 – 2021-08-19 (×5): 50 mg via ORAL
  Filled 2021-08-14 (×5): qty 1

## 2021-08-14 MED ORDER — ONDANSETRON HCL 4 MG/2ML IJ SOLN
4.0000 mg | Freq: Four times a day (QID) | INTRAMUSCULAR | Status: DC | PRN
  Administered 2021-08-15 – 2021-08-17 (×3): 4 mg via INTRAVENOUS
  Filled 2021-08-14 (×3): qty 2

## 2021-08-14 MED ORDER — KETOROLAC TROMETHAMINE 15 MG/ML IJ SOLN
15.0000 mg | Freq: Four times a day (QID) | INTRAMUSCULAR | Status: AC
  Administered 2021-08-14 – 2021-08-16 (×8): 15 mg via INTRAVENOUS
  Filled 2021-08-14 (×8): qty 1

## 2021-08-14 MED ORDER — ROSUVASTATIN CALCIUM 5 MG PO TABS
10.0000 mg | ORAL_TABLET | Freq: Every day | ORAL | Status: DC
  Administered 2021-08-15 – 2021-08-19 (×5): 10 mg via ORAL
  Filled 2021-08-14 (×5): qty 2

## 2021-08-14 MED ORDER — PROPOFOL 10 MG/ML IV BOLUS
INTRAVENOUS | Status: DC | PRN
  Administered 2021-08-14: 50 mg via INTRAVENOUS
  Administered 2021-08-14: 150 mg via INTRAVENOUS

## 2021-08-14 MED ORDER — ORAL CARE MOUTH RINSE: 15.0000 mL | Freq: Once | OROMUCOSAL | Status: AC

## 2021-08-14 MED ORDER — DOXYCYCLINE HYCLATE 100 MG PO TABS
100.0000 mg | ORAL_TABLET | Freq: Two times a day (BID) | ORAL | Status: DC
  Administered 2021-08-14 – 2021-08-19 (×10): 100 mg via ORAL
  Filled 2021-08-14 (×10): qty 1

## 2021-08-14 MED ORDER — BISACODYL 5 MG PO TBEC
10.0000 mg | DELAYED_RELEASE_TABLET | Freq: Every day | ORAL | Status: DC
  Administered 2021-08-15 – 2021-08-19 (×5): 10 mg via ORAL
  Filled 2021-08-14 (×5): qty 2

## 2021-08-14 MED ORDER — BISMUTH SUBSALICYLATE 262 MG PO CHEW
524.0000 mg | CHEWABLE_TABLET | Freq: Three times a day (TID) | ORAL | Status: DC
  Administered 2021-08-14 – 2021-08-19 (×18): 524 mg via ORAL
  Filled 2021-08-14 (×21): qty 2

## 2021-08-14 MED ORDER — ACETAMINOPHEN 500 MG PO TABS
1000.0000 mg | ORAL_TABLET | Freq: Four times a day (QID) | ORAL | Status: DC
Start: 2021-08-14 — End: 2021-08-19
  Administered 2021-08-14 – 2021-08-19 (×18): 1000 mg via ORAL
  Filled 2021-08-14 (×18): qty 2

## 2021-08-14 MED ORDER — LACTATED RINGERS IV SOLN: INTRAVENOUS | Status: DC | PRN

## 2021-08-14 MED ORDER — TRAMADOL HCL 50 MG PO TABS
50.0000 mg | ORAL_TABLET | Freq: Four times a day (QID) | ORAL | Status: DC | PRN
  Administered 2021-08-15 – 2021-08-18 (×5): 100 mg via ORAL
  Filled 2021-08-14 (×5): qty 2

## 2021-08-14 MED ORDER — HEMOSTATIC AGENTS (NO CHARGE) OPTIME
TOPICAL | Status: DC | PRN
Start: 2021-08-14 — End: 2021-08-14
  Administered 2021-08-14 (×3): 1 via TOPICAL

## 2021-08-14 MED ORDER — ACETAMINOPHEN 10 MG/ML IV SOLN
INTRAVENOUS | Status: AC
  Filled 2021-08-14: qty 100

## 2021-08-14 MED ORDER — PROMETHAZINE HCL 25 MG/ML IJ SOLN: 6.2500 mg | INTRAMUSCULAR | Status: DC | PRN

## 2021-08-14 MED ORDER — SUCCINYLCHOLINE CHLORIDE 200 MG/10ML IV SOSY
PREFILLED_SYRINGE | INTRAVENOUS | Status: DC | PRN
  Administered 2021-08-14: 120 mg via INTRAVENOUS

## 2021-08-14 MED ORDER — PHENYLEPHRINE HCL-NACL 20-0.9 MG/250ML-% IV SOLN
INTRAVENOUS | Status: DC | PRN
  Administered 2021-08-14: 30 ug/min via INTRAVENOUS

## 2021-08-14 MED ORDER — LACTATED RINGERS IV BOLUS
250.0000 mL | Freq: Once | INTRAVENOUS | Status: AC
  Administered 2021-08-14: 250 mL via INTRAVENOUS

## 2021-08-14 MED ORDER — PROPOFOL 10 MG/ML IV BOLUS
INTRAVENOUS | Status: AC
  Filled 2021-08-14: qty 20

## 2021-08-14 MED ORDER — AMISULPRIDE (ANTIEMETIC) 5 MG/2ML IV SOLN: 10.0000 mg | Freq: Once | INTRAVENOUS | Status: DC | PRN

## 2021-08-14 MED ORDER — ATENOLOL-CHLORTHALIDONE 50-25 MG PO TABS: 1.0000 | ORAL_TABLET | Freq: Every morning | ORAL | Status: DC

## 2021-08-14 MED ORDER — 0.9 % SODIUM CHLORIDE (POUR BTL) OPTIME
TOPICAL | Status: DC | PRN
  Administered 2021-08-14: 2000 mL

## 2021-08-14 MED ORDER — ACETAMINOPHEN 325 MG PO TABS: 325.0000 mg | ORAL_TABLET | ORAL | Status: DC | PRN

## 2021-08-14 MED ORDER — CEFAZOLIN SODIUM-DEXTROSE 2-4 GM/100ML-% IV SOLN
2.0000 g | Freq: Three times a day (TID) | INTRAVENOUS | Status: AC
  Administered 2021-08-14 (×2): 2 g via INTRAVENOUS
  Filled 2021-08-14 (×2): qty 100

## 2021-08-14 MED ORDER — LIDOCAINE 2% (20 MG/ML) 5 ML SYRINGE
INTRAMUSCULAR | Status: DC | PRN
  Administered 2021-08-14: 40 mg via INTRAVENOUS

## 2021-08-14 MED ORDER — CHLORHEXIDINE GLUCONATE 0.12 % MT SOLN
15.0000 mL | Freq: Once | OROMUCOSAL | Status: AC
  Administered 2021-08-14: 15 mL via OROMUCOSAL
  Filled 2021-08-14: qty 15

## 2021-08-14 MED ORDER — HYDROMORPHONE HCL 1 MG/ML IJ SOLN: 0.2500 mg | INTRAMUSCULAR | Status: DC | PRN

## 2021-08-14 MED ORDER — KETOROLAC TROMETHAMINE 15 MG/ML IJ SOLN
INTRAMUSCULAR | Status: AC
  Administered 2021-08-14: 15 mg
  Filled 2021-08-14: qty 1

## 2021-08-14 MED ORDER — CHLORTHALIDONE 25 MG PO TABS
25.0000 mg | ORAL_TABLET | Freq: Every day | ORAL | Status: DC
  Administered 2021-08-15 – 2021-08-19 (×5): 25 mg via ORAL
  Filled 2021-08-14 (×5): qty 1

## 2021-08-14 MED ORDER — BUPIVACAINE LIPOSOME 1.3 % IJ SUSP
INTRAMUSCULAR | Status: AC
  Filled 2021-08-14: qty 20

## 2021-08-14 MED ORDER — INSULIN ASPART 100 UNIT/ML IJ SOLN
0.0000 [IU] | Freq: Three times a day (TID) | INTRAMUSCULAR | Status: DC
  Administered 2021-08-14 – 2021-08-17 (×3): 2 [IU] via SUBCUTANEOUS

## 2021-08-14 MED ORDER — FENTANYL CITRATE (PF) 250 MCG/5ML IJ SOLN
INTRAMUSCULAR | Status: AC
  Filled 2021-08-14: qty 5

## 2021-08-14 MED ORDER — PHENYLEPHRINE HCL (PRESSORS) 10 MG/ML IV SOLN
INTRAVENOUS | Status: AC
  Filled 2021-08-14: qty 2

## 2021-08-14 MED ORDER — FENTANYL CITRATE (PF) 250 MCG/5ML IJ SOLN
INTRAMUSCULAR | Status: DC | PRN
  Administered 2021-08-14 (×2): 50 ug via INTRAVENOUS
  Administered 2021-08-14: 100 ug via INTRAVENOUS
  Administered 2021-08-14 (×2): 50 ug via INTRAVENOUS

## 2021-08-14 MED ORDER — ENOXAPARIN SODIUM 40 MG/0.4ML IJ SOSY
40.0000 mg | PREFILLED_SYRINGE | Freq: Every day | INTRAMUSCULAR | Status: DC
  Administered 2021-08-15 – 2021-08-19 (×5): 40 mg via SUBCUTANEOUS
  Filled 2021-08-14 (×5): qty 0.4

## 2021-08-14 MED ORDER — ROCURONIUM BROMIDE 10 MG/ML (PF) SYRINGE
PREFILLED_SYRINGE | INTRAVENOUS | Status: DC | PRN
Start: 2021-08-14 — End: 2021-08-14
  Administered 2021-08-14: 30 mg via INTRAVENOUS
  Administered 2021-08-14 (×2): 50 mg via INTRAVENOUS
  Administered 2021-08-14: 30 mg via INTRAVENOUS

## 2021-08-14 MED ORDER — STERILE WATER FOR IRRIGATION IR SOLN
Status: DC | PRN
  Administered 2021-08-14: 2000 mL

## 2021-08-14 SURGICAL SUPPLY — 116 items
ADH SKN CLS APL DERMABOND .7 (GAUZE/BANDAGES/DRESSINGS) ×1
APL SWBSTK 6 STRL LF DISP (MISCELLANEOUS) ×1
APPLICATOR COTTON TIP 6 STRL (MISCELLANEOUS) IMPLANT
APPLICATOR COTTON TIP 6IN STRL (MISCELLANEOUS) ×2
APPLIER CLIP ROT 10 11.4 M/L (STAPLE)
APR CLP MED LRG 11.4X10 (STAPLE)
BAG TISS RTRVL C300 12X14 (MISCELLANEOUS) ×1
BLADE CLIPPER SURG (BLADE) ×1 IMPLANT
BNDG COHESIVE 6X5 TAN STRL LF (GAUZE/BANDAGES/DRESSINGS) IMPLANT
CANISTER SUCT 3000ML PPV (MISCELLANEOUS) ×4 IMPLANT
CANNULA REDUC XI 12-8 STAPL (CANNULA) ×4
CANNULA REDUCER 12-8 DVNC XI (CANNULA) ×2 IMPLANT
CLIP APPLIE ROT 10 11.4 M/L (STAPLE) IMPLANT
CLIP TI WIDE RED SMALL 6 (CLIP) ×2 IMPLANT
CLIP VESOCCLUDE MED 6/CT (CLIP) IMPLANT
CNTNR URN SCR LID CUP LEK RST (MISCELLANEOUS) ×5 IMPLANT
CONN ST 1/4X3/8  BEN (MISCELLANEOUS)
CONN ST 1/4X3/8 BEN (MISCELLANEOUS) IMPLANT
CONT SPEC 4OZ STRL OR WHT (MISCELLANEOUS) ×34
DEFOGGER SCOPE WARMER CLEARIFY (MISCELLANEOUS) ×3 IMPLANT
DERMABOND ADVANCED (GAUZE/BANDAGES/DRESSINGS) ×1
DERMABOND ADVANCED .7 DNX12 (GAUZE/BANDAGES/DRESSINGS) ×1 IMPLANT
DRAIN CHANNEL 28F RND 3/8 FF (WOUND CARE) IMPLANT
DRAIN CHANNEL 32F RND 10.7 FF (WOUND CARE) IMPLANT
DRAPE ARM DVNC X/XI (DISPOSABLE) ×4 IMPLANT
DRAPE COLUMN DVNC XI (DISPOSABLE) ×1 IMPLANT
DRAPE CV SPLIT W-CLR ANES SCRN (DRAPES) ×2 IMPLANT
DRAPE DA VINCI XI ARM (DISPOSABLE) ×8
DRAPE DA VINCI XI COLUMN (DISPOSABLE) ×2
DRAPE HALF SHEET 40X57 (DRAPES) ×2 IMPLANT
DRAPE INCISE IOBAN 66X45 STRL (DRAPES) ×1 IMPLANT
DRAPE ORTHO SPLIT 77X108 STRL (DRAPES) ×2
DRAPE SURG ORHT 6 SPLT 77X108 (DRAPES) ×1 IMPLANT
ELECT BLADE 6.5 EXT (BLADE) IMPLANT
ELECT REM PT RETURN 9FT ADLT (ELECTROSURGICAL) ×2
ELECTRODE REM PT RTRN 9FT ADLT (ELECTROSURGICAL) ×1 IMPLANT
GAUZE KITTNER 4X5 RF (MISCELLANEOUS) ×6 IMPLANT
GAUZE SPONGE 4X4 12PLY STRL (GAUZE/BANDAGES/DRESSINGS) ×2 IMPLANT
GLOVE TRIUMPH SURG SIZE 7.5 (KITS) ×4 IMPLANT
GOWN STRL REUS W/ TWL LRG LVL3 (GOWN DISPOSABLE) ×2 IMPLANT
GOWN STRL REUS W/ TWL XL LVL3 (GOWN DISPOSABLE) ×2 IMPLANT
GOWN STRL REUS W/TWL 2XL LVL3 (GOWN DISPOSABLE) ×2 IMPLANT
GOWN STRL REUS W/TWL LRG LVL3 (GOWN DISPOSABLE) ×4
GOWN STRL REUS W/TWL XL LVL3 (GOWN DISPOSABLE) ×4
HEMOSTAT SURGICEL 2X14 (HEMOSTASIS) ×6 IMPLANT
IRRIGATION STRYKERFLOW (MISCELLANEOUS) ×1 IMPLANT
IRRIGATOR STRYKERFLOW (MISCELLANEOUS) ×2
KIT BASIN OR (CUSTOM PROCEDURE TRAY) ×2 IMPLANT
KIT SUCTION CATH 14FR (SUCTIONS) IMPLANT
KIT TURNOVER KIT B (KITS) ×2 IMPLANT
NDL HYPO 25GX1X1/2 BEV (NEEDLE) ×1 IMPLANT
NDL SPNL 22GX3.5 QUINCKE BK (NEEDLE) ×1 IMPLANT
NEEDLE HYPO 25GX1X1/2 BEV (NEEDLE) ×2 IMPLANT
NEEDLE SPNL 22GX3.5 QUINCKE BK (NEEDLE) ×2 IMPLANT
NS IRRIG 1000ML POUR BTL (IV SOLUTION) ×2 IMPLANT
PACK CHEST (CUSTOM PROCEDURE TRAY) ×2 IMPLANT
PAD ARMBOARD 7.5X6 YLW CONV (MISCELLANEOUS) ×4 IMPLANT
PORT ACCESS TROCAR AIRSEAL 12 (TROCAR) ×1 IMPLANT
PORT ACCESS TROCAR AIRSEAL 5M (TROCAR) ×1
RELOAD STAPLE 45 2.5 WHT DVNC (STAPLE) IMPLANT
RELOAD STAPLE 45 3.5 BLU DVNC (STAPLE) IMPLANT
RELOAD STAPLE 45 4.3 GRN DVNC (STAPLE) IMPLANT
RELOAD STAPLE 45 4.6 BLK DVNC (STAPLE) IMPLANT
RELOAD STAPLER 2.5X45 WHT DVNC (STAPLE) ×4 IMPLANT
RELOAD STAPLER 3.5X45 BLU DVNC (STAPLE) ×6 IMPLANT
RELOAD STAPLER 4.3X45 GRN DVNC (STAPLE) ×2 IMPLANT
RELOAD STAPLER 45 4.6 BLK DVNC (STAPLE) ×2 IMPLANT
SCISSORS LAP 5X35 DISP (ENDOMECHANICALS) IMPLANT
SEAL CANN UNIV 5-8 DVNC XI (MISCELLANEOUS) ×2 IMPLANT
SEAL XI 5MM-8MM UNIVERSAL (MISCELLANEOUS) ×4
SEALANT PROGEL (MISCELLANEOUS) IMPLANT
SET TRI-LUMEN FLTR TB AIRSEAL (TUBING) ×2 IMPLANT
SOLUTION ELECTROLUBE (MISCELLANEOUS) ×2 IMPLANT
SPONGE INTESTINAL PEANUT (DISPOSABLE) IMPLANT
SPONGE TONSIL TAPE 1 RFD (DISPOSABLE) IMPLANT
STAPLER 45 SUREFORM CVD (STAPLE) ×4
STAPLER 45 SUREFORM CVD DVNC (STAPLE) IMPLANT
STAPLER CANNULA SEAL DVNC XI (STAPLE) ×2 IMPLANT
STAPLER CANNULA SEAL XI (STAPLE) ×4
STAPLER RELOAD 2.5X45 WHITE (STAPLE) ×8
STAPLER RELOAD 2.5X45 WHT DVNC (STAPLE) ×4
STAPLER RELOAD 3.5X45 BLU DVNC (STAPLE) ×6
STAPLER RELOAD 3.5X45 BLUE (STAPLE) ×12
STAPLER RELOAD 4.3X45 GREEN (STAPLE) ×4
STAPLER RELOAD 4.3X45 GRN DVNC (STAPLE) ×2
STAPLER RELOAD 45 4.6 BLK (STAPLE) ×4
STAPLER RELOAD 45 4.6 BLK DVNC (STAPLE) ×2
SUT PDS AB 3-0 SH 27 (SUTURE) IMPLANT
SUT PROLENE 4 0 RB 1 (SUTURE)
SUT PROLENE 4-0 RB1 .5 CRCL 36 (SUTURE) IMPLANT
SUT SILK  1 MH (SUTURE) ×2
SUT SILK 1 MH (SUTURE) ×2 IMPLANT
SUT SILK 1 TIES 10X30 (SUTURE) IMPLANT
SUT SILK 2 0 SH (SUTURE) IMPLANT
SUT SILK 2 0SH CR/8 30 (SUTURE) ×1 IMPLANT
SUT SILK 3 0SH CR/8 30 (SUTURE) IMPLANT
SUT VIC AB 1 CTX 36 (SUTURE)
SUT VIC AB 1 CTX36XBRD ANBCTR (SUTURE) IMPLANT
SUT VIC AB 2-0 CTX 36 (SUTURE) ×1 IMPLANT
SUT VIC AB 3-0 MH 27 (SUTURE) IMPLANT
SUT VIC AB 3-0 X1 27 (SUTURE) ×4 IMPLANT
SUT VICRYL 0 TIES 12 18 (SUTURE) ×2 IMPLANT
SUT VICRYL 0 UR6 27IN ABS (SUTURE) ×4 IMPLANT
SUT VICRYL 2 TP 1 (SUTURE) IMPLANT
SYR 20ML LL LF (SYRINGE) ×4 IMPLANT
SYSTEM RETRIEVAL ANCHOR 12 (MISCELLANEOUS) ×1 IMPLANT
SYSTEM SAHARA CHEST DRAIN ATS (WOUND CARE) ×2 IMPLANT
TAPE CLOTH 4X10 WHT NS (GAUZE/BANDAGES/DRESSINGS) ×2 IMPLANT
TAPE CLOTH SURG 4X10 WHT LF (GAUZE/BANDAGES/DRESSINGS) ×1 IMPLANT
TIP APPLICATOR SPRAY EXTEND 16 (VASCULAR PRODUCTS) IMPLANT
TOWEL GREEN STERILE (TOWEL DISPOSABLE) ×4 IMPLANT
TRAY FOLEY MTR SLVR 16FR STAT (SET/KITS/TRAYS/PACK) ×2 IMPLANT
TROCAR BLADELESS 15MM (ENDOMECHANICALS) IMPLANT
TROCAR XCEL 12X100 BLDLESS (ENDOMECHANICALS) IMPLANT
TROCAR XCEL BLADELESS 5X75MML (TROCAR) IMPLANT
WATER STERILE IRR 1000ML POUR (IV SOLUTION) ×3 IMPLANT

## 2021-08-14 NOTE — Progress Notes (Addendum)
Pacu Nursing Note  Patient having large clots in tubing close to connector of chest tube tubing. Needing to continue to "milk" tubing to keep it from clotting. Large clot moved down thru tubing to CT chamber approx size of small cigar.  Patients dressing at CT insertion site is also saturated. It needs constant milking to keep it patent.   Called Dr Roxan Hockey to come assess. He came and stated to just reinforce dressing. RN stated that she was also more concerned with the clotting that is occurring and worried it would occlude the drainage. Dr Roxan Hockey stated "just reinforce dressing, I'm not taking her back to OR for that." He seemed unconcerned and that nothing else needed to be done.   Will reinforce and continue to assess ourput and drainage for patency and clots.

## 2021-08-14 NOTE — Progress Notes (Signed)
Pacu RN Report to floor given  Gave report to Sonya Walton, rm 2C11. Discussed surgery, meds given in OR and Pacu, VS, IV fluids given, EBL, urine output, pain and other pertinent information. Also discussed if pt had any family or friends here or belongings with them.    Discussed CT and drainage/clotting and that Dr Roxan Hockey had come to assess and to just reinforce.  Pt exits my care.

## 2021-08-14 NOTE — Anesthesia Procedure Notes (Signed)
Central Venous Catheter Insertion Performed by: Effie Berkshire, MD, anesthesiologist Start/End10/14/2022 7:00 AM, 08/14/2021 7:10 AM Patient location: Pre-op. Preanesthetic checklist: patient identified, IV checked, site marked, risks and benefits discussed, surgical consent, monitors and equipment checked, pre-op evaluation, timeout performed and anesthesia consent Position: Trendelenburg Lidocaine 1% used for infiltration and patient sedated Hand hygiene performed , maximum sterile barriers used  and Seldinger technique used Catheter size: 8 Fr Total catheter length 16. Central line was placed.Double lumen Procedure performed using ultrasound guided technique. Ultrasound Notes:anatomy identified, needle tip was noted to be adjacent to the nerve/plexus identified, no ultrasound evidence of intravascular and/or intraneural injection and image(s) printed for medical record Attempts: 1 Following insertion, dressing applied, line sutured and Biopatch. Post procedure assessment: blood return through all ports  Patient tolerated the procedure well with no immediate complications.

## 2021-08-14 NOTE — Anesthesia Procedure Notes (Signed)
Procedure Name: Intubation Date/Time: 08/14/2021 7:52 AM Performed by: Griffin Dakin, CRNA Pre-anesthesia Checklist: Patient identified, Emergency Drugs available, Suction available and Patient being monitored Patient Re-evaluated:Patient Re-evaluated prior to induction Oxygen Delivery Method: Circle system utilized Preoxygenation: Pre-oxygenation with 100% oxygen Induction Type: IV induction Laryngoscope Size: Mac and 4 Grade View: Grade II Tube type: Oral Endobronchial tube: Left, Double lumen EBT, EBT position confirmed by fiberoptic bronchoscope and EBT position confirmed by auscultation and 39 Fr Number of attempts: 1 Airway Equipment and Method: Stylet Placement Confirmation: ETT inserted through vocal cords under direct vision, positive ETCO2 and breath sounds checked- equal and bilateral Secured at: 29 cm Tube secured with: Tape Dental Injury: Teeth and Oropharynx as per pre-operative assessment

## 2021-08-14 NOTE — Hospital Course (Addendum)
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     Right upper lobe lung nodule-mixed density nodule superior lateral aspect of right upper lobe.  Has slowly grown in size over the past year.  Minimal activity on PET.  I concur with Dr. 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  We discussed multiple potential options including continued radiographic follow-up, repeat bronchoscopy, CT-guided biopsy, and surgical resection.  Given the nodule has grown over time I do not think continued radiographic follow-up is appropriate, unless she refuses all other options.  She refuses to have a repeat bronchoscopy because of her pneumothorax after the first 1.  I discussed CT-guided biopsy but emphasized to Mrs. Blevens and her daughter that there can be the same issues with sampling error.  Finally the most aggressive, but also most definitive option would be to proceed with surgical resection.  That would involve a wedge resection and possible lobectomy if the lesion more cancerous.  She does have adequate pulmonary function to tolerate resection.  I informed her and her daughter of the general nature of the proposed procedure.  We will plan to do a robotic right upper lobe wedge resection to be followed by lobectomy if the nodule is cancerous.  Given the size of the lesion I think will be difficult to get an adequate margin with just a wedge resection.  I informed them of the need for general anesthesia, the incisions to be used, the use of drains to postoperatively, the expected hospital stay, and the overall recovery.  I informed them of the indications, risks, benefits, and alternatives.  They understand the risks include, but not limited to death, MI, DVT, PE, bleeding, possible need for  transfusion, infection, prolonged air leak, cardiac arrhythmias, as well as possibility of other unforeseeable complications.  Course in Hospital: Sonya Walton was admitted for elective surgery on 08/14/2021.  She was taken to the operating room where right upper lobe wedge resection of the pulmonary mass was performed using robotic technique.  The lesion proved to be adenocarcinoma.  Dr. Roxan Hockey proceeded with robotic assisted right upper lobectomy with lymph node dissection.  Following the procedure, Sonya Walton was taken to the postanesthesia care unit in stable condition.  Postoperative hospital course:  Patient has maintained stable vital signs with the exception of some early hypotension.  She was started on IV fluids with improvement.  She was noted to have a significant amount of postoperative bloody drainage early on but this slowed over time and hemoglobin hematocrit have remained fairly stable over the first 2 postoperative days.  She is maintaining good urine output and normal renal function.  Chest x-ray appearance is remained stable.  Blood sugars have been under adequate control with plans to resume home regimen at discharge as appetite improves.  She was hypokalemic and supplemented with oral and IV potassium.  Her chest tube output decreased and her chest tube was able to be removed on 08/18/2021.   Follow up CXR showed stable appearance of right apical space and elevation of diaphragm.  Her central line was removed without difficulty.  She continued to require oxygen with activity, and home use was arranged.  She is ambulating without difficulty.  Her surgical incisions are healing without evidence of infection.  She is medically stable for discharge home today.

## 2021-08-14 NOTE — Transfer of Care (Addendum)
Immediate Anesthesia Transfer of Care Note  Patient: Sonya Walton  Procedure(s) Performed: XI ROBOTIC ASSISTED THORASCOPY-WEDGE RESECTION (Right: Chest) XI ROBOTIC ASSISTED THORASCOPY RIGHT UPPER LOBECTOMY (Right: Chest) NODE DISSECTION (Right: Chest) INTERCOSTAL NERVE BLOCK (Right: Chest)  Patient Location: PACU  Anesthesia Type:General  Level of Consciousness: awake, alert  and oriented  Airway & Oxygen Therapy: Patient Spontanous Breathing and Patient connected to face mask oxygen  Post-op Assessment: Report given to RN and Post -op Vital signs reviewed and stable  Post vital signs: Reviewed and stable  Last Vitals:  Vitals Value Taken Time  BP 127/74 08/14/21 1138  Temp    Pulse 87 08/14/21 1143  Resp 14 08/14/21 1143  SpO2 100 % 08/14/21 1143  Vitals shown include unvalidated device data.  Last Pain:  Vitals:   08/14/21 0616  TempSrc:   PainSc: 0-No pain      Patients Stated Pain Goal: 0 (08/30/12 1438)  Complications: No notable events documented.

## 2021-08-14 NOTE — Brief Op Note (Addendum)
08/14/2021  11:24 AM  PATIENT:  Sonya Walton  60 y.o. 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)  PROCEDURE:  Procedure(s): XI ROBOTIC ASSISTED THORASCOPY-WEDGE RESECTION (Right) XI ROBOTIC ASSISTED THORASCOPY RIGHT UPPER LOBECTOMY (Right) NODE DISSECTION (Right) INTERCOSTAL NERVE BLOCK (Right)  SURGEON:  Surgeon(s) and Role:    * [name], MD - Primary  PHYSICIAN ASSISTANT: Roddenberry  ANESTHESIA:   general  EBL:  70 mL   BLOOD ADMINISTERED:none  DRAINS:  42fr right pleural Blake drain    LOCAL MEDICATIONS USED:  Exparel intercostal block  SPECIMEN:  Right upper lung lobe wedge, remaining lobe, and multiple lymph nodes  DISPOSITION OF SPECIMEN:  PATHOLOGY  COUNTS:  YES  DICTATION: .Dragon Dictation  PLAN OF CARE: Admit to inpatient   PATIENT DISPOSITION:  PACU - hemodynamically stable.   Delay start of Pharmacological VTE agent (>24hrs) due to surgical blood loss or risk of bleeding: yes

## 2021-08-14 NOTE — Progress Notes (Signed)
Pacu Nursing Note  Dr Roxan Hockey here to assess pt, RN asked about CT to H2O seal or suction. PCXR was being done and Dr Lemmie Evens reviewed xray, Pneumo was noted, Dr Roxan Hockey asked for pt to have CT to suction.  Placed CT to suction, changed order.

## 2021-08-14 NOTE — Interval H&P Note (Signed)
History and Physical Interval Note:  She decided to proceed with surgery Results of recent endoscopy noted  08/14/2021 7:17 AM  Sonya Walton  has presented today for surgery, with the diagnosis of RUL NODULE.  The various methods of treatment have been discussed with the patient and family. After consideration of risks, benefits and other options for treatment, the patient has consented to  Procedure(s): XI ROBOTIC ASSISTED THORASCOPY-WEDGE RESECTION, possible upper lobectomy (Right) as a surgical intervention.  The patient's history has been reviewed, patient examined, no change in status, stable for surgery.  I have reviewed the patient's chart and labs.  Questions were answered to the patient's satisfaction.     [name]

## 2021-08-14 NOTE — Anesthesia Procedure Notes (Signed)
Arterial Line Insertion Start/End10/14/2022 7:00 PM, 08/14/2021 7:10 PM Performed by: Effie Berkshire, MD, Griffin Dakin, CRNA, CRNA  Patient location: Pre-op. Preanesthetic checklist: patient identified, IV checked, site marked, risks and benefits discussed, surgical consent, monitors and equipment checked, pre-op evaluation, timeout performed and anesthesia consent Lidocaine 1% used for infiltration Left, radial was placed Catheter size: 20 G Hand hygiene performed  and maximum sterile barriers used   Attempts: 1 Procedure performed without using ultrasound guided technique. Following insertion, dressing applied and Biopatch. Post procedure assessment: normal and unchanged  Patient tolerated the procedure well with no immediate complications.

## 2021-08-14 NOTE — Anesthesia Postprocedure Evaluation (Signed)
Anesthesia Post Note  Patient: Nelani C Bensinger  Procedure(s) Performed: XI ROBOTIC ASSISTED THORASCOPY-WEDGE RESECTION (Right: Chest) XI ROBOTIC ASSISTED THORASCOPY RIGHT UPPER LOBECTOMY (Right: Chest) NODE DISSECTION (Right: Chest) INTERCOSTAL NERVE BLOCK (Right: Chest)     Patient location during evaluation: PACU Anesthesia Type: General Level of consciousness: awake and alert Pain management: pain level controlled Vital Signs Assessment: post-procedure vital signs reviewed and stable Respiratory status: spontaneous breathing, nonlabored ventilation, respiratory function stable and patient connected to nasal cannula oxygen Cardiovascular status: blood pressure returned to baseline and stable Postop Assessment: no apparent nausea or vomiting Anesthetic complications: no   No notable events documented.  Last Vitals:  Vitals:   08/14/21 1320 08/14/21 1325  BP:  103/62  Pulse: 85 86  Resp: 14 14  Temp:  36.7 C  SpO2: 99% 99%    Last Pain:  Vitals:   08/14/21 1325  TempSrc:   PainSc: [place] Bryella Diviney

## 2021-08-15 ENCOUNTER — Encounter (HOSPITAL_COMMUNITY): Payer: Self-pay | Admitting: Thoracic Surgery (Cardiothoracic Vascular Surgery)

## 2021-08-15 ENCOUNTER — Inpatient Hospital Stay (HOSPITAL_COMMUNITY): Payer: PRIVATE HEALTH INSURANCE

## 2021-08-15 LAB — BASIC METABOLIC PANEL
Anion gap: 5 (ref 5–15)
BUN: 10 mg/dL (ref 8–23)
CO2: 28 mmol/L (ref 22–32)
Calcium: 7.9 mg/dL — ABNORMAL LOW (ref 8.9–10.3)
Chloride: 104 mmol/L (ref 98–111)
Creatinine, Ser: 0.63 mg/dL (ref 0.44–1.00)
GFR, Estimated: >60 mL/min (ref >60)
Glucose, Bld: 117 mg/dL — ABNORMAL HIGH (ref 70–99)
Potassium: 2.9 mmol/L — ABNORMAL LOW (ref 3.5–5.1)
Sodium: 137 mmol/L (ref 135–145)

## 2021-08-15 LAB — CBC
HCT: 33.6 % — ABNORMAL LOW (ref 36.0–46.0)
Hemoglobin: 11 g/dL — ABNORMAL LOW (ref 12.0–15.0)
MCH: 29.3 pg (ref 26.0–34.0)
MCHC: 32.7 g/dL (ref 30.0–36.0)
MCV: 89.4 fL (ref 80.0–100.0)
Platelets: 200 10*3/uL (ref 150–400)
RBC: 3.76 MIL/uL — ABNORMAL LOW (ref 3.87–5.11)
RDW: 14.5 % (ref 11.5–15.5)
WBC: 13.1 10*3/uL — ABNORMAL HIGH (ref 4.0–10.5)
nRBC: 0 % (ref 0.0–0.2)

## 2021-08-15 LAB — GLUCOSE, CAPILLARY
Glucose-Capillary: 115 mg/dL — ABNORMAL HIGH (ref 70–99)
Glucose-Capillary: 125 mg/dL — ABNORMAL HIGH (ref 70–99)
Glucose-Capillary: 138 mg/dL — ABNORMAL HIGH (ref 70–99)
Glucose-Capillary: 86 mg/dL (ref 70–99)

## 2021-08-15 MED ORDER — POTASSIUM CHLORIDE CRYS ER 20 MEQ PO TBCR
40.0000 meq | EXTENDED_RELEASE_TABLET | Freq: Two times a day (BID) | ORAL | Status: AC
  Administered 2021-08-15 (×2): 40 meq via ORAL
  Filled 2021-08-15 (×2): qty 2

## 2021-08-15 MED ORDER — CHLORHEXIDINE GLUCONATE CLOTH 2 % EX PADS
6.0000 | MEDICATED_PAD | Freq: Every day | CUTANEOUS | Status: DC
  Administered 2021-08-15 – 2021-08-17 (×3): 6 via TOPICAL

## 2021-08-15 NOTE — Progress Notes (Addendum)
[address]       [place] [address]             [phone]      1 Day Post-Op Procedure(s) (LRB): XI ROBOTIC ASSISTED THORASCOPY-WEDGE RESECTION (Right) XI ROBOTIC ASSISTED THORASCOPY RIGHT UPPER LOBECTOMY (Right) NODE DISSECTION (Right) INTERCOSTAL NERVE BLOCK (Right) Subjective: Some pain, mostly well controlled  Objective: Vital signs in last 24 hours: Temp:  [97.5 F (36.4 C)-98.6 F (37 C)] 97.7 F (36.5 C) (10/15 0721) Pulse Rate:  [70-94] 75 (10/15 0721) Cardiac Rhythm: Normal sinus rhythm (10/15 0300) Resp:  [13-23] 20 (10/15 0721) BP: (79-131)/(52-85) 117/67 (10/15 0721) SpO2:  [90 %-100 %] 96 % (10/15 0721) Arterial Line BP: (73-152)/(35-73) 118/61 (10/14 1325)  Hemodynamic parameters for last 24 hours:    Intake/Output from previous day: 10/14 0701 - 10/15 0700 In: 3662.6 [P.O.:240; I.V.:3422.6] Out: 2350 [Urine:1255; Blood:70; Chest Tube:1025] Intake/Output this shift: No intake/output data recorded.  General appearance: alert, cooperative, and no distress Heart: regular rate and rhythm Lungs: mildly dim right base Abdomen: benign Extremities: PAS in place Wound: incis healing well  Lab Results: Recent Labs    08/14/21 1809 08/15/21 0408  WBC 14.8* 13.1*  HGB 13.6 11.0*  HCT 40.4 33.6*  PLT 214 200   BMET:  Recent Labs    08/12/21 1400 08/14/21 0837 08/15/21 0408  NA 139 141 137  K 3.1* 3.0* 2.9*  CL 103  --  104  CO2 26  --  28  GLUCOSE 102*  --  117*  BUN 9  --  10  CREATININE 0.67  --  0.63  CALCIUM 9.5  --  7.9*    PT/INR:  Recent Labs    08/12/21 1400  LABPROT 14.8  INR 1.2   ABG    Component Value Date/Time   PHART 7.346 (L) 08/14/2021 0837   HCO3 28.2 (H) 08/14/2021 0837   TCO2 30 08/14/2021 0837   O2SAT 100.0 08/14/2021 0837   CBG (last 3)  Recent Labs    08/14/21 1612 08/14/21 2107 08/15/21 0608  GLUCAP 166* 154* 115*    Meds Scheduled Meds:  acetaminophen  1,000 mg Oral Q6H    Or   acetaminophen (TYLENOL) oral liquid 160 mg/5 mL  1,000 mg Oral Q6H   atenolol  50 mg Oral Daily   And   chlorthalidone  25 mg Oral Daily   bisacodyl  10 mg Oral Daily   bismuth subsalicylate  734 mg Oral TID AC & HS   doxycycline  100 mg Oral BID   enoxaparin (LOVENOX) injection  40 mg Subcutaneous Daily   insulin aspart  0-24 Units Subcutaneous TID AC & HS   ketorolac  15 mg Intravenous Q6H   metroNIDAZOLE  250 mg Oral QID   pantoprazole  40 mg Oral BID   rosuvastatin  10 mg Oral Daily   senna-docusate  1 tablet Oral QHS   Continuous Infusions:  sodium chloride 100 mL/hr at 08/15/21 0137   PRN Meds:.morphine injection, ondansetron (ZOFRAN) IV, traMADol  Xrays DG Chest Port 1 View  Result Date: 08/14/2021 CLINICAL DATA:  Status post right-sided lobectomy. EXAM: PORTABLE CHEST 1 VIEW COMPARISON:  08/12/2021 FINDINGS: Patient rotated right. Tracheal deviation right. Right internal jugular line tip at low SVC. Numerous leads and wires project over the chest. Right hemidiaphragm elevation. Normal heart size for level of inspiration. No pleural fluid. Heterogeneous lucencies about the right perihilar lung and apex are most consistent  with small to moderate volume pneumothorax. No lobar consolidation. IMPRESSION: Small to moderate volume right-sided pneumothorax. These results will be called to the ordering clinician or representative by the Radiologist Assistant, and communication documented in the PACS or [employer]. Electronically Signed   By: Abigail Miyamoto M.D.   On: 08/14/2021 14:21    Assessment/Plan: S/P Procedure(s) (LRB): XI ROBOTIC ASSISTED THORASCOPY-WEDGE RESECTION (Right) XI ROBOTIC ASSISTED THORASCOPY RIGHT UPPER LOBECTOMY (Right) NODE DISSECTION (Right) INTERCOSTAL NERVE BLOCK (Right)  POD#1  1 afeb, VSS SBP 79-130's, improved over time with IVF- reduce rate as beeding has slowed 2 sats ok on 3 liters 3 CT 1025- keep in place, slowed over last 12 hours,  bloody- no air leak, place to H2O seal 4 adeq UOP 5 expected ABLA - monitor 6 hypokalemia- replace 7 normal renal fxn 8 CBG ok control- cont SSI, hold on home meds for now 9 reactive leukocytosis- trending lower 10 CXR some hilar fullness and medial effusion on right, no pntx  11 routine pulm toilet/rehab. Add flutter valve   LOS: 1 day    Gaspar Bidding Pager [phone] 08/15/2021   Agree with above Lots of CT output.  Will keep tubes in today  [place]

## 2021-08-15 NOTE — Op Note (Signed)
Sonya Walton, Sonya Walton MEDICAL RECORD NO: [id number] ACCOUNT NO: [id number] DATE OF BIRTH: 16-Mar-1961 FACILITY: [facility] LOCATION: [facility] PHYSICIAN: Revonda Standard. Roxan Hockey, MD  Operative Report   DATE OF PROCEDURE: 08/14/2021  PREOPERATIVE DIAGNOSIS:  Right upper lobe lung nodule.  POSTOPERATIVE DIAGNOSIS:  Adenocarcinoma right upper lobe, clinical stage IA (T1, N0).  PROCEDURE:   Xi robotic-assisted right thoracoscopy,  Right upper lobe wedge resection,  Right upper lobectomy,  Lymph node dissection and  Intercostal nerve blocks levels 3 through 10.  SURGEON:  Revonda Standard. Roxan Hockey, MD  ASSISTANT:  Enid Cutter, PA  ANESTHESIA:  General.  FINDINGS:  Frozen section of nodule showed adenocarcinoma.  CLINICAL NOTE:  Sonya Walton is a 60 year old woman with a history of tobacco abuse, who has been followed over time for a mixed density nodule in the right upper lobe.  The nodule had increased in size.  Navigational bronchoscopy was performed, but was  nondiagnostic.  She then opted to continue with radiographic followup; however, on a recent CT, the nodule had increased in size again and she was advised to undergo surgical resection.  The indications, risks, benefits, and alternatives were discussed  in detail with the patient.  She understood and accepted the risks and agreed to proceed.  OPERATIVE NOTE:  Sonya Walton was brought to the operating room on 08/14/2021.  She had induction of general anesthesia and was intubated with a double lumen endotracheal tube.  Intravenous antibiotics were administered.  Sequential compression devices  were placed on the calves for DVT prophylaxis.  Foley catheter was placed.  She was placed in a left lateral decubitus position and the right chest was prepped and draped in the usual sterile fashion.  Single lung ventilation of the left lung was  initiated and was tolerated well throughout the procedure.  A timeout was performed and a solution  containing 20 mL of liposomal bupivacaine, 30 mL of 0.5% bupivacaine and 50 mL of saline was prepared.  This was used for local at the incision sites as well as for the intercostal nerve blocks.  An incision was  made in the eighth interspace in the mid axillary line and an 8 mm port was inserted. The thoracoscope was advanced in the chest.  After confirming intrapleural placement, carbon dioxide was insufflated per protocol.  A 12 mm port was placed in the  eighth interspace anterior to the camera port.  Then, a 12 mm AirSeal port was placed in the tenth interspace.  Intercostal nerve blocks were performed from the third to the tenth interspace.  10 mL of the bupivacaine solution was injected into a  subpleural plane at each level.  Two additional eighth interspace ports were placed. The robot was deployed.  The camera arm was docked.  Targeting was performed.  The remaining arms were docked. The robotic instruments were inserted with thoracoscopic  visualization.  Initially, the lung was not deflated enough to be 601% certain where the nodule was, so the lymph node dissection was begun.  The inferior ligament was divided with bipolar cautery, pleural reflection was divided to the hilum posteriorly.  All lymph  nodes that were encountered were removed and sent as separate specimens for permanent pathology.  The patient did tend to bleed easily with any manipulation and also had some bleeding from the robotic port access sites.  After the level 7 nodes were  removed, inspection was made and the upper lobe was deflated enough that it was clear where the nodule was. A wedge  resection was performed using green and black cartridges on the robotic stapler.  Once completed, the specimen was placed into a 12 mm  endoscopic retrieval bag, removed, and sent for frozen section.  While awaiting the result, the pleural reflection was divided superiorly between the azygos and the hilum.  Then, the pleura was incised  over the mediastinum superior to the azygos vein and level  4 nodes were removed.  The frozen section returned showing adenocarcinoma and decision was made to proceed with lobectomy.  The pulmonary artery was dissected out in the central portion near the confluence of the major and minor fissures. The basilar segmental artery was  identified and then the superior segmental artery was identified.  The major fissure was completed working posteriorly between the superior segment and the right upper lobe with sequential firings of the robotic stapler.  The posterior ascending branch  now was exposed.  It was dissected out, encircled and divided with the vascular stapler.  Dissection then was carried from posterior to anterior along the pulmonary artery.  The middle lobe branches were identified. The posterior upper lobe vein was  identified and a plane was developed along this and the minor fissure was completed working from posterior to anterior, again using the robotic stapler.  The upper lobe veins then were encircled and divided with a vascular stapler.  There was a relatively large  level 10 node along the bronchus.  Once this was dissected out, the anterior and apical branches were divided. A stapler then was placed across the right upper lobe bronchus at its origin and closed.  A test inflation showed good aeration of the lower  and middle lobes.  The stapler was fired transecting the right upper lobe bronchus.  The middle lobe then was tacked to the lower lobe using the robotic stapler.  The sponges and vessel loop that had been used during the dissection were removed.  The  right upper lobe was placed into an endoscopic retrieval bag.  The robotic instruments were removed.  The robot was undocked.  The anterior eighth interspace incision was lengthened to approximately 3 cm and the right upper lobe was removed in the  endoscopic retrieval bag through this incision and sent for frozen section.  Lobe  was removed through this incision and sent for permanent pathology.  Final inspection was made for hemostasis.  The chest was copiously irrigated with warm saline.  A test  inflation showed no air leakage from the bronchial stump or the staple lines.  A 28-French Blake drain was placed through the original port incision and secured with a #1 silk suture.  Dual lung ventilation was resumed.  The remaining incisions were  closed in standard fashion.  Chest tube was placed to a Pleur-Evac on waterseal.  The patient then was extubated in the operating room and taken to the postanesthetic care unit in good condition.  All sponge, needle and instrument counts were correct at  the end of the procedure.   VAI D: 08/14/2021 4:52:33 pm T: 08/15/2021 3:04:00 am  JOB: [id number]/ [id number]

## 2021-08-16 ENCOUNTER — Inpatient Hospital Stay (HOSPITAL_COMMUNITY): Payer: PRIVATE HEALTH INSURANCE

## 2021-08-16 LAB — COMPREHENSIVE METABOLIC PANEL
ALT: 31 U/L (ref 0–44)
AST: 39 U/L (ref 15–41)
Albumin: 2.6 g/dL — ABNORMAL LOW (ref 3.5–5.0)
Alkaline Phosphatase: 64 U/L (ref 38–126)
Anion gap: 5 (ref 5–15)
BUN: 9 mg/dL (ref 8–23)
CO2: 27 mmol/L (ref 22–32)
Calcium: 7.9 mg/dL — ABNORMAL LOW (ref 8.9–10.3)
Chloride: 104 mmol/L (ref 98–111)
Creatinine, Ser: 0.52 mg/dL (ref 0.44–1.00)
GFR, Estimated: >60 mL/min (ref >60)
Glucose, Bld: 80 mg/dL (ref 70–99)
Potassium: 3.8 mmol/L (ref 3.5–5.1)
Sodium: 136 mmol/L (ref 135–145)
Total Bilirubin: 0.8 mg/dL (ref 0.3–1.2)
Total Protein: 5.1 g/dL — ABNORMAL LOW (ref 6.5–8.1)

## 2021-08-16 LAB — CBC
HCT: 33.4 % — ABNORMAL LOW (ref 36.0–46.0)
Hemoglobin: 11.1 g/dL — ABNORMAL LOW (ref 12.0–15.0)
MCH: 30.1 pg (ref 26.0–34.0)
MCHC: 33.2 g/dL (ref 30.0–36.0)
MCV: 90.5 fL (ref 80.0–100.0)
Platelets: 187 10*3/uL (ref 150–400)
RBC: 3.69 MIL/uL — ABNORMAL LOW (ref 3.87–5.11)
RDW: 14.8 % (ref 11.5–15.5)
WBC: 12.9 10*3/uL — ABNORMAL HIGH (ref 4.0–10.5)
nRBC: 0 % (ref 0.0–0.2)

## 2021-08-16 LAB — GLUCOSE, CAPILLARY
Glucose-Capillary: 111 mg/dL — ABNORMAL HIGH (ref 70–99)
Glucose-Capillary: 119 mg/dL — ABNORMAL HIGH (ref 70–99)
Glucose-Capillary: 112 mg/dL — ABNORMAL HIGH (ref 70–99)
Glucose-Capillary: 108 mg/dL — ABNORMAL HIGH (ref 70–99)

## 2021-08-16 NOTE — Progress Notes (Signed)
Centre closed chest tube drainage bag is changed without any complications.  Pt is ambulatory to the room Tolerating clear diet with no vomiting No PRN pain meds besides scheduled one Chest tube site dressing changed and output recorded every 4 hour  Will continue to monitor the patient  Palma Holter, RN

## 2021-08-16 NOTE — Progress Notes (Addendum)
[address]       , [address]             [phone]      2 Days Post-Op Procedure(s) (LRB): XI ROBOTIC ASSISTED THORASCOPY-WEDGE RESECTION (Right) XI ROBOTIC ASSISTED THORASCOPY RIGHT UPPER LOBECTOMY (Right) NODE DISSECTION (Right) INTERCOSTAL NERVE BLOCK (Right) Subjective: Conts to feel fairly well  Objective: Vital signs in last 24 hours: Temp:  [97.6 F (36.4 C)-98 F (36.7 C)] 97.7 F (36.5 C) (10/16 0312) Pulse Rate:  [70-76] 76 (10/16 0312) Cardiac Rhythm: Normal sinus rhythm (10/16 0400) Resp:  [15-20] 15 (10/16 0312) BP: (110-128)/(62-70) 115/62 (10/16 0312) SpO2:  [91 %-97 %] 97 % (10/16 0312)  Hemodynamic parameters for last 24 hours:    Intake/Output from previous day: 10/15 0701 - 10/16 0700 In: 1780.6 [P.O.:680; I.V.:1100.6] Out: 2015 [Urine:1625; Chest Tube:390] Intake/Output this shift: No intake/output data recorded.  General appearance: alert, cooperative, fatigued, and no distress Heart: regular rate and rhythm Lungs: clear to auscultation bilaterally Abdomen: benign Extremities: no edema or calf tenderness Wound: incis ok  Lab Results: Recent Labs    08/15/21 0408 08/16/21 0350  WBC 13.1* 12.9*  HGB 11.0* 11.1*  HCT 33.6* 33.4*  PLT 200 187   BMET:  Recent Labs    08/15/21 0408 08/16/21 0350  NA 137 136  K 2.9* 3.8  CL 104 104  CO2 28 27  GLUCOSE 117* 80  BUN 10 9  CREATININE 0.63 0.52  CALCIUM 7.9* 7.9*    PT/INR: No results for input(s): LABPROT, INR in the last 72 hours. ABG    Component Value Date/Time   PHART 7.346 (L) 08/14/2021 0837   HCO3 28.2 (H) 08/14/2021 0837   TCO2 30 08/14/2021 0837   O2SAT 100.0 08/14/2021 0837   CBG (last 3)  Recent Labs    08/15/21 1546 08/15/21 2120 08/16/21 0653  GLUCAP 138* 86 111*    Meds Scheduled Meds:  acetaminophen  1,000 mg Oral Q6H   Or   acetaminophen (TYLENOL) oral liquid 160 mg/5 mL  1,000 mg Oral Q6H   atenolol  50 mg Oral  Daily   And   chlorthalidone  25 mg Oral Daily   bisacodyl  10 mg Oral Daily   bismuth subsalicylate  710 mg Oral TID AC & HS   Chlorhexidine Gluconate Cloth  6 each Topical Daily   doxycycline  100 mg Oral BID   enoxaparin (LOVENOX) injection  40 mg Subcutaneous Daily   insulin aspart  0-24 Units Subcutaneous TID AC & HS   ketorolac  15 mg Intravenous Q6H   metroNIDAZOLE  250 mg Oral QID   pantoprazole  40 mg Oral BID   rosuvastatin  10 mg Oral Daily   senna-docusate  1 tablet Oral QHS   Continuous Infusions:  sodium chloride 50 mL/hr at 08/15/21 2300   PRN Meds:.morphine injection, ondansetron (ZOFRAN) IV, traMADol  Xrays DG Chest Port 1 View  Result Date: 08/15/2021 CLINICAL DATA:  Shortness of breath. History of recent RIGHT upper lobectomy. EXAM: PORTABLE CHEST 1 VIEW COMPARISON:  Chest x-ray dated 08/14/2021 FINDINGS: RIGHT IJ central line is stable in position. Opacity at the RIGHT lung apex, likely pleural fluid/pneumothorax related to the recent partial lobectomy. RIGHT-sided chest tube in place with tip directed towards the RIGHT lung apex. Lungs otherwise clear. No pleural effusion is seen. Heart size and mediastinal contours are stable. IMPRESSION: 1. Expected small pleural fluid and/or pneumothorax at the RIGHT  lung apex status post partial lobectomy. RIGHT-sided chest tube in place with tip directed towards the RIGHT lung apex. 2. Otherwise unremarkable chest x-ray. Electronically Signed   By: Franki Cabot M.D.   On: 08/15/2021 09:04   DG Chest Port 1 View  Result Date: 08/14/2021 CLINICAL DATA:  Status post right-sided lobectomy. EXAM: PORTABLE CHEST 1 VIEW COMPARISON:  08/12/2021 FINDINGS: Patient rotated right. Tracheal deviation right. Right internal jugular line tip at low SVC. Numerous leads and wires project over the chest. Right hemidiaphragm elevation. Normal heart size for level of inspiration. No pleural fluid. Heterogeneous lucencies about the right perihilar  lung and apex are most consistent with small to moderate volume pneumothorax. No lobar consolidation. IMPRESSION: Small to moderate volume right-sided pneumothorax. These results will be called to the ordering clinician or representative by the Radiologist Assistant, and communication documented in the PACS or [employer]. Electronically Signed   By: Abigail Miyamoto M.D.   On: 08/14/2021 14:21    Assessment/Plan: S/P Procedure(s) (LRB): XI ROBOTIC ASSISTED THORASCOPY-WEDGE RESECTION (Right) XI ROBOTIC ASSISTED THORASCOPY RIGHT UPPER LOBECTOMY (Right) NODE DISSECTION (Right) INTERCOSTAL NERVE BLOCK (Right) POD#2  1 afeb, VSS, NSR 2 sats ok on 2 liters, SOB with ambulation 3 CT 390 /24 h, 200 overnight, bloody, no air leak, leave on H2O seal 4 CXR stable in appearance c/w yesterday film- push pulm toilet, rehab as able 5 H/H remains stable 6 leukocytosis trend conts to improve 7 normal renal fxn 8 CBG's adeq control- not requiring home meds yet, appetite fairly good , home meds at d/c 9 cont lovenox for now for DVT PPX but may be contrib to bloody drainage      LOS: 2 days    John Giovanni PA-C Pager [phone] 08/16/2021    Agree with above. 390 mL over the last 24 hours.  It is more serosanguineous today. Off oxygen. Continue pulmonary hygiene.  Breylin Dom Bary Leriche

## 2021-08-17 ENCOUNTER — Inpatient Hospital Stay (HOSPITAL_COMMUNITY): Payer: PRIVATE HEALTH INSURANCE

## 2021-08-17 LAB — CBC
HCT: 33.7 % — ABNORMAL LOW (ref 36.0–46.0)
Hemoglobin: 11.1 g/dL — ABNORMAL LOW (ref 12.0–15.0)
MCH: 29.8 pg (ref 26.0–34.0)
MCHC: 32.9 g/dL (ref 30.0–36.0)
MCV: 90.6 fL (ref 80.0–100.0)
Platelets: 170 10*3/uL (ref 150–400)
RBC: 3.72 MIL/uL — ABNORMAL LOW (ref 3.87–5.11)
RDW: 14.6 % (ref 11.5–15.5)
WBC: 8.8 10*3/uL (ref 4.0–10.5)
nRBC: 0 % (ref 0.0–0.2)

## 2021-08-17 LAB — COMPREHENSIVE METABOLIC PANEL
ALT: 33 U/L (ref 0–44)
AST: 36 U/L (ref 15–41)
Albumin: 2.6 g/dL — ABNORMAL LOW (ref 3.5–5.0)
Alkaline Phosphatase: 65 U/L (ref 38–126)
Anion gap: 6 (ref 5–15)
BUN: 6 mg/dL — ABNORMAL LOW (ref 8–23)
CO2: 27 mmol/L (ref 22–32)
Calcium: 7.9 mg/dL — ABNORMAL LOW (ref 8.9–10.3)
Chloride: 103 mmol/L (ref 98–111)
Creatinine, Ser: 0.48 mg/dL (ref 0.44–1.00)
GFR, Estimated: >60 mL/min (ref >60)
Glucose, Bld: 100 mg/dL — ABNORMAL HIGH (ref 70–99)
Potassium: 3 mmol/L — ABNORMAL LOW (ref 3.5–5.1)
Sodium: 136 mmol/L (ref 135–145)
Total Bilirubin: 1 mg/dL (ref 0.3–1.2)
Total Protein: 5.3 g/dL — ABNORMAL LOW (ref 6.5–8.1)

## 2021-08-17 LAB — SURGICAL PATHOLOGY

## 2021-08-17 LAB — GLUCOSE, CAPILLARY
Glucose-Capillary: 94 mg/dL (ref 70–99)
Glucose-Capillary: 110 mg/dL — ABNORMAL HIGH (ref 70–99)
Glucose-Capillary: 100 mg/dL — ABNORMAL HIGH (ref 70–99)
Glucose-Capillary: 128 mg/dL — ABNORMAL HIGH (ref 70–99)

## 2021-08-17 MED ORDER — POTASSIUM CHLORIDE CRYS ER 20 MEQ PO TBCR
40.0000 meq | EXTENDED_RELEASE_TABLET | Freq: Once | ORAL | Status: AC
  Administered 2021-08-17: 40 meq via ORAL
  Filled 2021-08-17: qty 2

## 2021-08-17 MED ORDER — METOCLOPRAMIDE HCL 5 MG/5ML PO SOLN
10.0000 mg | Freq: Three times a day (TID) | ORAL | Status: DC
Start: 2021-08-17 — End: 2021-08-19
  Administered 2021-08-17 (×2): 10 mg via ORAL
  Filled 2021-08-17 (×8): qty 10

## 2021-08-17 MED ORDER — ORAL CARE MOUTH RINSE
15.0000 mL | Freq: Two times a day (BID) | OROMUCOSAL | Status: DC
  Administered 2021-08-18 – 2021-08-19 (×3): 15 mL via OROMUCOSAL

## 2021-08-17 MED ORDER — POTASSIUM CHLORIDE 10 MEQ/50ML IV SOLN
10.0000 meq | INTRAVENOUS | Status: AC
  Administered 2021-08-17 (×4): 10 meq via INTRAVENOUS
  Filled 2021-08-17 (×4): qty 50

## 2021-08-17 MED ORDER — SODIUM CHLORIDE 0.9 % IV SOLN
6.2500 mg | Freq: Four times a day (QID) | INTRAVENOUS | Status: DC | PRN
  Filled 2021-08-17: qty 0.25

## 2021-08-17 NOTE — Discharge Summary (Addendum)
Physician Discharge Summary  Patient ID: Sonya Walton MRN: [id number] DOB/AGE: 1961/07/09 60 y.o.  Admit date: 08/14/2021 Discharge date: 08/20/2021  Admission Diagnoses: Right upper lobe lung nodule  Patient Active Problem List   Diagnosis Date Noted   Pneumothorax after biopsy 03/09/2021   Pulmonary nodule 1 cm or greater in diameter 12/22/2020   Mass of right lung 12/12/2020   Thyroid nodule incidentally noted on imaging study 10/07/2020   Morbid obesity ([place]) 02/22/2020   Screening for malignant neoplasm of colon 02/22/2020   Encounter for screening mammogram for malignant neoplasm of breast 02/22/2020   Left breast mass 07/07/2018   Diabetes mellitus with hyperglycemia ([place]) 04/12/2016   Rotator cuff arthropathy 12/30/2015   Partial tear of right rotator cuff 05/02/2015   Hypertension 10/05/2012   History of tobacco use 12/31/2011   Arthritis 12/31/2011   Discharge Diagnoses: Adenocarcinoma right upper lobe, clinical and pathologic stage IA(T1N0)  Patient Active Problem List   Diagnosis Date Noted   S/P Robotic Assisted Right VATS with Right Lower Lobectomy, Lymph Node Dissection, Intercostal nerve block 08/14/2021   Pneumothorax after biopsy 03/09/2021   Pulmonary nodule 1 cm or greater in diameter 12/22/2020   Mass of right lung 12/12/2020   Thyroid nodule incidentally noted on imaging study 10/07/2020   Morbid obesity ([place]) 02/22/2020   Screening for malignant neoplasm of colon 02/22/2020   Encounter for screening mammogram for malignant neoplasm of breast 02/22/2020   Left breast mass 07/07/2018   Diabetes mellitus with hyperglycemia ([place]) 04/12/2016   Rotator cuff arthropathy 12/30/2015   Partial tear of right rotator cuff 05/02/2015   Hypertension 10/05/2012   History of tobacco use 12/31/2011   Arthritis 12/31/2011   Discharged Condition: good  History of Present Illness:  Sonya Walton is a 60 year old woman with a history of tobacco abuse (quit  2019), hypertension, hyperlipidemia, osteoarthritis, type 2 insulin-dependent diabetes without complication, and a thyroid nodule.     Right upper lobe lung nodule-mixed density nodule superior lateral aspect of right upper lobe.  Has slowly grown in size over the past year.  Minimal activity on PET.  I concur with Dr. 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  We discussed multiple potential options including continued radiographic follow-up, repeat bronchoscopy, CT-guided biopsy, and surgical resection.  Given the nodule has grown over time I do not think continued radiographic follow-up is appropriate, unless she refuses all other options.  She refuses to have a repeat bronchoscopy because of her pneumothorax after the first 1.  I discussed CT-guided biopsy but emphasized to Mrs. Walrath and her daughter that there can be the same issues with sampling error.  Finally the most aggressive, but also most definitive option would be to proceed with surgical resection.  That would involve a wedge resection and possible lobectomy if the lesion more cancerous.  She does have adequate pulmonary function to tolerate resection.  I informed her and her daughter of the general nature of the proposed procedure.  We will plan to do a robotic right upper lobe wedge resection to be followed by lobectomy if the nodule is cancerous.  Given the size of the lesion I think will be difficult to get an adequate margin with just a wedge resection.  I informed them of the need for general anesthesia, the incisions to be used, the use of drains to postoperatively, the expected hospital stay, and the overall recovery.  I informed them of the indications, risks, benefits, and alternatives.  They understand the risks include, but not limited to death, MI, DVT, PE, bleeding, possible need for transfusion, infection, prolonged air leak, cardiac arrhythmias, as well as possibility of other unforeseeable  complications.  Course in Hospital: [name] was admitted for elective surgery on 08/14/2021.  She was taken to the operating room where right upper lobe wedge resection of the pulmonary mass was performed using robotic technique.  The lesion proved to be adenocarcinoma.  Dr. Roxan Hockey proceeded with robotic assisted right upper lobectomy with lymph node dissection.  Following the procedure, [name] was taken to the postanesthesia care unit in stable condition.  Postoperative hospital course:  Patient has maintained stable vital signs with the exception of some early hypotension.  She was started on IV fluids with improvement.  She was noted to have a significant amount of postoperative bloody drainage early on but this slowed over time and hemoglobin hematocrit have remained fairly stable over the first 2 postoperative days.  She is maintaining good urine output and normal renal function.  Chest x-ray appearance is remained stable.  Blood sugars have been under adequate control with plans to resume home regimen at discharge as appetite improves.  She was hypokalemic and supplemented with oral and IV potassium.  Her chest tube output decreased and her chest tube was able to be removed on 08/18/2021.   Follow up CXR showed stable appearance of right apical space and elevation of diaphragm.  Her central line was removed without difficulty.  She continued to require oxygen with activity, and home use was arranged.  She is ambulating without difficulty.  Her surgical incisions are healing without evidence of infection.  She is medically stable for discharge home today.  Significant Diagnostic Studies: nuclear medicine:   . Ground-glass nodule in the apical segment right upper lobe does not show metabolism above blood pool but has increased in size slightly from 09/05/2020, per CT chest 12/09/2020, and remains worrisome for low-grade adenocarcinoma. 2. Cirrhosis. 3. Asymmetrically enlarged  heterogeneous left lobe of the thyroid. Patient underwent ultrasound thyroid 10/03/2020. No immediate follow-up recommended unless clinically warranted. (Ref: J Am Coll Radiol. 2015 Feb;12(2): 143-50). 4. Aortic atherosclerosis (ICD10-I70.0). Coronary artery calcification.  Treatments: surgery:   Operative Report    DATE OF PROCEDURE: 08/14/2021   PREOPERATIVE DIAGNOSIS:  Right upper lobe lung nodule.   POSTOPERATIVE DIAGNOSIS:  Adenocarcinoma right upper lobe, clinical stage IA (T1, N0).   PROCEDURE:  Xi robotic-assisted right thoracoscopy, right upper lobe wedge resection, right upper lobectomy, lymph node dissection and intercostal nerve blocks levels 3 through 10.   SURGEON:  Revonda Standard. Roxan Hockey, MD   ASSISTANT:  Enid Cutter, PA  PATHOLOGY:  SURGICAL PATHOLOGY  CASE: [phone]  PATIENT: Elmyra Ricks  Surgical Pathology Report   Clinical History: RUL nodule (cm)   FINAL MICROSCOPIC DIAGNOSIS:   A. LUNG, RIGHT UPPER LOBE, WEDGE RESECTION:  - Invasive moderately differentiated adenocarcinoma, 1.6 cm  - Visceral pleura is not involved  - Tumor involves the stapled resection edge, see comment  - See oncology table   B. LYMPH NODE, LEVEL 9, EXCISION:  - Lymph node, negative for carcinoma (0/1)   C. LYMPH NODE, LEVEL 7, EXCISION:  - Lymph node, negative for carcinoma (0/1)   D. LYMPH NODE, LEVEL 7 #2, EXCISION:  - Lymph node, negative for carcinoma (0/1)   E. LYMPH NODE, LEVEL 10, EXCISION:  - Lymph node, negative for carcinoma (0/1)   F. LYMPH NODE, LEVEL 4R, EXCISION:  - Lymph node, negative  for carcinoma (0/1)   G. LYMPH NODE, LEVEL 4R #2, EXCISION:  - Lymph node, negative for carcinoma (0/1)   H. LYMPH NODE, LEVEL 4R #3, EXCISION:  - Lymph node, negative for carcinoma (0/1)   I. LYMPH NODE, LEVEL 12, EXCISION:  - Lymph node, negative for carcinoma (0/1)   J. LYMPH NODE, LEVEL 12 #2, EXCISION:  - Lymph node, negative for carcinoma (0/1)    K. LYMPH NODE, LEVEL 11, EXCISION:  - Lymph node, negative for carcinoma (0/1)   L. LYMPH NODE, LEVEL 11 #2, EXCISION:  - Lymph node, negative for carcinoma (0/1)   M. LYMPH NODE, LEVEL 10 #2, EXCISION:  - Lymph node, negative for carcinoma (0/1)   N. LYMPH NODE, LEVEL 10 #3, EXCISION:  - Lymph node, negative for carcinoma (0/1)   O. LUNG, RIGHT UPPER LOBE, LOBECTOMY:  - Small focus of invasive adenocarcinoma, 0.3 cm  - Bronchovascular margins are negative for carcinoma   Discharge Exam: Blood pressure 127/74, pulse 84, temperature (!) 97.5 F (36.4 C), temperature source Oral, resp. rate 18, height _0  (1.6 m), weight 210 lb (95.3 kg), SpO2 96 %.  General appearance: alert, cooperative, and no distress Heart: regular rate and rhythm Lungs: diminished breath sounds right base Abdomen: soft, non-tender; bowel sounds normal; no masses,  no organomegaly Extremities: extremities normal, atraumatic, no cyanosis or edema Wound: clean and dry  Disposition: Discharge disposition: 01-Home or Self Care       Allergies as of 08/19/2021       Reactions   Percocet [oxycodone-acetaminophen] Other (See Comments)   Dysphoria        Medication List     TAKE these medications    acetaminophen 500 MG tablet Commonly known as: TYLENOL Take 1,000 mg by mouth every 6 (six) hours as needed for mild pain or moderate pain.   aspirin EC 81 MG tablet Take 1 tablet (81 mg total) by mouth daily. Swallow whole.   atenolol-chlorthalidone 50-25 MG tablet Commonly known as: TENORETIC TAKE 1 TABLET BY MOUTH EVERY DAY What changed:  how much to take how to take this when to take this additional instructions   Basaglar KwikPen 100 UNIT/ML Inject 16 Units into the skin daily. What changed: how much to take   bismuth subsalicylate 629 MG chewable tablet Commonly known as: Pepto-Bismol Chew 2 tablets (524 mg total) by mouth 4 (four) times daily for 14 days.   blood glucose  meter kit and supplies Kit Inject 1 each into the skin as directed. Use meter once daily to measure blood glucose.   cetirizine 10 MG tablet Commonly known as: ZYRTEC Take 10 mg by mouth daily as needed for allergies.   [employer] Advanced Glucose Test test strip Generic drug: glucose blood Please specify directions, refills and quantity\ Once daily, [id number], 3 refills, 100 quantity Dr Lattie Haw MD   doxycycline 100 MG capsule Commonly known as: VIBRAMYCIN Take 1 capsule (100 mg total) by mouth 2 (two) times daily for 14 days.   Dulaglutide 3 MG/0.5ML Sopn Inject 3 mg into the skin once a week. What changed: when to take this   empagliflozin 10 MG Tabs tablet Commonly known as: JARDIANCE Take 10 mg by mouth in the morning.   metroNIDAZOLE 250 MG tablet Commonly known as: FLAGYL Take 1 tablet (250 mg total) by mouth 4 (four) times daily for 14 days.   nitroGLYCERIN 0.4 MG SL tablet Commonly known as: Nitrostat Place 1 tablet (0.4 mg total) under the  tongue every 5 (five) minutes as needed for chest pain.   omeprazole 20 MG capsule Commonly known as: PRILOSEC Take 1 capsule (20 mg total) by mouth 2 (two) times daily before a meal for 14 days.   ondansetron 4 MG tablet Commonly known as: Zofran Take 1 tablet (4 mg total) by mouth daily as needed for nausea or vomiting.   Pen Needles 32G X 6 MM Misc 1 each by Does not apply route daily.   ReliOn Pen Needles 32G X 4 MM Misc Generic drug: Insulin Pen Needle USE 1 STRIP TO CHECK GLUCOSE ONCE DAILY   potassium chloride SA 20 MEQ tablet Commonly known as: KLOR-CON Take 2 tablets (40 mEq total) by mouth daily.   rosuvastatin 10 MG tablet Commonly known as: CRESTOR Take 1 tablet (10 mg total) by mouth daily.   traMADol 50 MG tablet Commonly known as: ULTRAM Take 1-2 tablets (50-100 mg total) by mouth every 6 (six) hours as needed (mild pain).   Vitamin D3 50 MCG (2000 UT) Tabs Take 2,000 Units by mouth every  morning.        Follow-up Information     [facility] Follow up.   Why: oxygen Contact information: [place] [address] [phone]                 Signed: Ellwood Handler, PA-C  08/20/2021, 9:06 AM

## 2021-08-17 NOTE — Progress Notes (Addendum)
      [address]       [place] [address]             [phone]      3 Days Post-Op Procedure(s) (LRB): XI ROBOTIC ASSISTED THORASCOPY-WEDGE RESECTION (Right) XI ROBOTIC ASSISTED THORASCOPY RIGHT UPPER LOBECTOMY (Right) NODE DISSECTION (Right) INTERCOSTAL NERVE BLOCK (Right)  Subjective:  Up in chair.  Doing okay.  Pain control is adequate.  She has experienced some vomiting, medication helps.  She doesn't wish to advance diet as every time she does it makes her sick.  + BM  Objective: Vital signs in last 24 hours: Temp:  [97.6 F (36.4 C)-98.1 F (36.7 C)] 98.1 F (36.7 C) (10/17 0319) Pulse Rate:  [77-95] 86 (10/17 0319) Cardiac Rhythm: Normal sinus rhythm;Bundle branch block (10/17 0711) Resp:  [16-25] 16 (10/17 0319) BP: (117-143)/(63-81) 143/81 (10/17 0319) SpO2:  [91 %-94 %] 93 % (10/17 0319)  Intake/Output from previous day: 10/16 0701 - 10/17 0700 In: 1702.4 [P.O.:480; I.V.:1222.4] Out: 1410 [Urine:1000; Chest Tube:410]  General appearance: alert, cooperative, and no distress Lungs: clear to auscultation bilaterally Abdomen: soft, non-tender; bowel sounds normal; no masses,  no organomegaly Extremities: extremities normal, atraumatic, no cyanosis or edema Wound: clean and dry  Lab Results: Recent Labs    08/16/21 0350 08/17/21 0205  WBC 12.9* 8.8  HGB 11.1* 11.1*  HCT 33.4* 33.7*  PLT 187 170   BMET:  Recent Labs    08/16/21 0350 08/17/21 0205  NA 136 136  K 3.8 3.0*  CL 104 103  CO2 27 27  GLUCOSE 80 100*  BUN 9 6*  CREATININE 0.52 0.48  CALCIUM 7.9* 7.9*    PT/INR: No results for input(s): LABPROT, INR in the last 72 hours. ABG    Component Value Date/Time   PHART 7.346 (L) 08/14/2021 0837   HCO3 28.2 (H) 08/14/2021 0837   TCO2 30 08/14/2021 0837   O2SAT 100.0 08/14/2021 0837   CBG (last 3)  Recent Labs    08/16/21 1528 08/16/21 2204 08/17/21 0632  GLUCAP 112* 108* 94    Assessment/Plan: S/P  Procedure(s) (LRB): XI ROBOTIC ASSISTED THORASCOPY-WEDGE RESECTION (Right) XI ROBOTIC ASSISTED THORASCOPY RIGHT UPPER LOBECTOMY (Right) NODE DISSECTION (Right) INTERCOSTAL NERVE BLOCK (Right)  CV- NSR, BP stable- continue home Atenolol, Chlorthatlidone Pulm- no air leak present, 410 cc serous output yesterday ( level is currently at 200 in pleurovac) leave tube in place today, no CXR was ordered, will obtain Hypokalemia- K is at 3.0, will give 40 meq today GI- N/V at times, relieves with anti-emetics, remains on liquid diet, doesn't wish to advance DM- cbgs well controlled DVT prophylaxis, continue Lovenox Dispo- patient stable, CT output remains high to remove, will await results of CXR, patient stable, leave chest tube in place until output decreases   LOS: 3 days    Ellwood Handler, PA-C' 08/17/2021  Patient seen and examined, agree with above Will give IV KCl in addition to PO Ambulate Path pending  Remo Lipps C. Roxan Hockey, MD [facility] [phone]

## 2021-08-18 ENCOUNTER — Inpatient Hospital Stay (HOSPITAL_COMMUNITY): Payer: PRIVATE HEALTH INSURANCE

## 2021-08-18 LAB — GLUCOSE, CAPILLARY
Glucose-Capillary: 96 mg/dL (ref 70–99)
Glucose-Capillary: 94 mg/dL (ref 70–99)
Glucose-Capillary: 91 mg/dL (ref 70–99)
Glucose-Capillary: 99 mg/dL (ref 70–99)

## 2021-08-18 LAB — BASIC METABOLIC PANEL
Anion gap: 6 (ref 5–15)
BUN: 5 mg/dL — ABNORMAL LOW (ref 8–23)
CO2: 30 mmol/L (ref 22–32)
Calcium: 8.6 mg/dL — ABNORMAL LOW (ref 8.9–10.3)
Chloride: 102 mmol/L (ref 98–111)
Creatinine, Ser: 0.51 mg/dL (ref 0.44–1.00)
GFR, Estimated: >60 mL/min (ref >60)
Glucose, Bld: 96 mg/dL (ref 70–99)
Potassium: 3.3 mmol/L — ABNORMAL LOW (ref 3.5–5.1)
Sodium: 138 mmol/L (ref 135–145)

## 2021-08-18 MED ORDER — POTASSIUM CHLORIDE CRYS ER 20 MEQ PO TBCR
20.0000 meq | EXTENDED_RELEASE_TABLET | ORAL | Status: AC
  Administered 2021-08-18 (×3): 20 meq via ORAL
  Filled 2021-08-18 (×3): qty 1

## 2021-08-18 NOTE — Progress Notes (Signed)
Mobility Specialist Progress Note:   08/18/21 1410  Oxygen Therapy  SpO2 98 %  O2 Flow Rate (L/min) 2 L/min  Mobility  Activity Ambulated in hall  Level of Assistance Modified independent, requires aide device or extra time  Assistive Device Front wheel walker  Distance Ambulated (ft) 180 ft  Mobility Ambulated with assistance in hallway  Mobility Response Tolerated well  Mobility performed by Mobility specialist  $Mobility charge 1 Mobility   During Mobility: SpO2 85-92% with unreliable pleth Post Mobility: SpO2 98%  Pt received in bed, agreed to mobility. Ambulated in hallway 180' on RA with RW. Pt desat to 85% during amb with an unreliable pleth. Pt stated that SOB has decreased since this morning. Left in bed with all needs met.   Nelta Numbers Mobility Specialist  Phone [phone]

## 2021-08-18 NOTE — Progress Notes (Addendum)
      [address]       [place] [address]             [phone]      4 Days Post-Op Procedure(s) (LRB): XI ROBOTIC ASSISTED THORASCOPY-WEDGE RESECTION (Right) XI ROBOTIC ASSISTED THORASCOPY RIGHT UPPER LOBECTOMY (Right) NODE DISSECTION (Right) INTERCOSTAL NERVE BLOCK (Right)  Subjective:  No new complaints.  Nausea, vomiting improved.  She tolerated a regular diet at dinner.  Up in chair this morning attempting to eat breakfast.  Didn't ambulate in hall yesterday.  Objective: Vital signs in last 24 hours: Temp:  [97.8 F (36.6 C)-98.6 F (37 C)] 98.6 F (37 C) (10/18 0300) Pulse Rate:  [74-84] 82 (10/18 0300) Cardiac Rhythm: Normal sinus rhythm (10/18 0700) Resp:  [17-20] 19 (10/18 0300) BP: (126-141)/(65-77) 128/76 (10/18 0300) SpO2:  [92 %-100 %] 95 % (10/18 0300)  Intake/Output from previous day: 10/17 0701 - 10/18 0700 In: 417.9 [P.O.:240; IV Piggyback:177.9] Out: 210 [Chest Tube:210]  General appearance: alert, cooperative, and no distress Heart: regular rate and rhythm Lungs: diminished breath sounds right base Abdomen: soft, non-tender; bowel sounds normal; no masses,  no organomegaly Extremities: extremities normal, atraumatic, no cyanosis or edema Wound: clean and dry  Lab Results: Recent Labs    08/16/21 0350 08/17/21 0205  WBC 12.9* 8.8  HGB 11.1* 11.1*  HCT 33.4* 33.7*  PLT 187 170   BMET:  Recent Labs    08/17/21 0205 08/18/21 0517  NA 136 138  K 3.0* 3.3*  CL 103 102  CO2 27 30  GLUCOSE 100* 96  BUN 6* 5*  CREATININE 0.48 0.51  CALCIUM 7.9* 8.6*    PT/INR: No results for input(s): LABPROT, INR in the last 72 hours. ABG    Component Value Date/Time   PHART 7.346 (L) 08/14/2021 0837   HCO3 28.2 (H) 08/14/2021 0837   TCO2 30 08/14/2021 0837   O2SAT 100.0 08/14/2021 0837   CBG (last 3)  Recent Labs    08/17/21 1616 08/17/21 2111 08/18/21 0621  GLUCAP 100* 128* 96    Assessment/Plan: S/P Procedure(s)  (LRB): XI ROBOTIC ASSISTED THORASCOPY-WEDGE RESECTION (Right) XI ROBOTIC ASSISTED THORASCOPY RIGHT UPPER LOBECTOMY (Right) NODE DISSECTION (Right) INTERCOSTAL NERVE BLOCK (Right)  CV- remains hemodynamically stable in NSR- continue Lopressor, Chlorthalidone Pulm- CT output decreasing, 270 cc output yesterday fluid less serous today (level currently at 470 in pleurovac), I suspect chest tube will remain in place today, will discuss with Dr. Roxan Hockey... CXR with stable trace apical space Hypokalemia- persists, but improved.Marland Kitchen additional supplementation ordered GI- N/V improved, continue prn emetics Dm- sugars remain well controlled Dispo- patient stable, CT output has decreased to 270 cc output last 24 hours, fluid is less serous than yesterday, continue to supplement potassium, will discuss chest tube management with Dr. Roxan Hockey, continue current care   LOS: 4 days    Erin Barrett, PA-C 08/18/2021 CXR stable. No air leak. Drainage trending down- will dc chest tube Dc central line Deconditioning- just back from walk, continue ambulation Possibly home tomorrow Path- T1N0, stage IA adenocarcinoma- informed her of result  Sonya Walton C. Roxan Hockey, MD [facility] [phone]

## 2021-08-18 NOTE — Progress Notes (Signed)
SATURATION QUALIFICATIONS: (This note is used to comply with regulatory documentation for home oxygen)  Patient Saturations on Room Air at Rest = 91%  Patient Saturations on Room Air while Ambulating = 85%  Patient Saturations on 2 Liters of oxygen while Ambulating = 94%  Please briefly explain why patient needs home oxygen: desats and feels SOB when ambulating.

## 2021-08-19 ENCOUNTER — Inpatient Hospital Stay (HOSPITAL_COMMUNITY): Payer: PRIVATE HEALTH INSURANCE

## 2021-08-19 LAB — GLUCOSE, CAPILLARY: Glucose-Capillary: 91 mg/dL (ref 70–99)

## 2021-08-19 MED ORDER — TRAMADOL HCL 50 MG PO TABS: 50.0000 mg | ORAL_TABLET | Freq: Four times a day (QID) | ORAL | 0 refills | Status: DC | PRN

## 2021-08-19 NOTE — Progress Notes (Signed)
O2 tank delivered, instruction given on how to use it. Discharged home accompanied by daughter. Discharge instructions given to pt.

## 2021-08-19 NOTE — Consult Note (Signed)
   [facility] CM Inpatient Consult   08/19/2021  Sonya Walton [birth year] [id number] [place] [employer] [ACO] Patient: showing in banner, not a [facility] insurance  The patient is not on the current member enrollment rosters for any of the [employer] risk contracted plans.    Please call if you need further clarification.   Reason:  Not a beneficiary currently attributed to one of the [place].    Natividad Brood, RN BSN [facility] Liaison  [phone] business mobile phone Toll free office [phone]  Fax number: [phone] [email or web address]

## 2021-08-19 NOTE — TOC Transition Note (Signed)
Transition of Care [facility]) - CM/SW Discharge Note   Patient Details  Name: Sonya Walton MRN: [id number] Date of Birth: 05/09/1961  Transition of Care [facility]) CM/SW Contact:  Zenon Mayo, RN Phone Number: 08/19/2021, 9:23 AM   Clinical Narrative:    Patient is for dc today, NCM spoke with patient, aksed if she has preference for oxygen company, she states she does not, she is ok with [employer] supplying the home oxygen.  NCM made referral to [facility] with [employer], a tank will be brought to patient's room and concentrator will be set up at patient's home.  Patient states her daughter will transport her home.    Final next level of care: Home/Self Care Barriers to Discharge: No Barriers Identified   Patient Goals and CMS Choice Patient states their goals for this hospitalization and ongoing recovery are:: return home   Choice offered to / list presented to : NA  Discharge Placement                       Discharge Plan and Services                DME Arranged: Oxygen DME Agency: [employer] Date DME Agency Contacted: 08/19/21 Time DME Agency Contacted: [phone] Representative spoke with at DME Agency: Thedore Mins [facility] Arranged: NA          Social Determinants of Health ([place]) Interventions     Readmission Risk Interventions No flowsheet data found.

## 2021-08-19 NOTE — Progress Notes (Addendum)
      [address]       [place] [address]             [phone]        5 Days Post-Op Procedure(s) (LRB): XI ROBOTIC ASSISTED THORASCOPY-WEDGE RESECTION (Right) XI ROBOTIC ASSISTED THORASCOPY RIGHT UPPER LOBECTOMY (Right) NODE DISSECTION (Right) INTERCOSTAL NERVE BLOCK (Right)  Subjective:  Up in bed, no new complaints.  Overall feeling better and like she is making progress. She has a walker at home.  Objective: Vital signs in last 24 hours: Temp:  [97.9 F (36.6 C)-98.8 F (37.1 C)] 98.3 F (36.8 C) (10/19 0227) Pulse Rate:  [72-86] 77 (10/19 0227) Cardiac Rhythm: Normal sinus rhythm (10/19 0709) Resp:  [15-25] 20 (10/19 0227) BP: (115-142)/(65-75) 130/70 (10/19 0227) SpO2:  [92 %-98 %] 96 % (10/19 0227)  Intake/Output from previous day: 10/18 0701 - 10/19 0700 In: 240 [P.O.:240] Out: -   General appearance: alert, cooperative, and no distress Heart: regular rate and rhythm Lungs: diminished breath sounds right base Abdomen: soft, non-tender; bowel sounds normal; no masses,  no organomegaly Extremities: extremities normal, atraumatic, no cyanosis or edema Wound: clean and dry  Lab Results: Recent Labs    08/17/21 0205  WBC 8.8  HGB 11.1*  HCT 33.7*  PLT 170   BMET:  Recent Labs    08/17/21 0205 08/18/21 0517  NA 136 138  K 3.0* 3.3*  CL 103 102  CO2 27 30  GLUCOSE 100* 96  BUN 6* 5*  CREATININE 0.48 0.51  CALCIUM 7.9* 8.6*    PT/INR: No results for input(s): LABPROT, INR in the last 72 hours. ABG    Component Value Date/Time   PHART 7.346 (L) 08/14/2021 0837   HCO3 28.2 (H) 08/14/2021 0837   TCO2 30 08/14/2021 0837   O2SAT 100.0 08/14/2021 0837   CBG (last 3)  Recent Labs    08/18/21 1556 08/18/21 2111 08/19/21 0614  GLUCAP 91 99 91    Assessment/Plan: S/P Procedure(s) (LRB): XI ROBOTIC ASSISTED THORASCOPY-WEDGE RESECTION (Right) XI ROBOTIC ASSISTED THORASCOPY RIGHT UPPER LOBECTOMY (Right) NODE DISSECTION  (Right) INTERCOSTAL NERVE BLOCK (Right)  CV- hemodynamically stable, continue Lopressor, Chlorthalidone Pulm- CT out yesterday, CXR with stable right apical space, elevation of right hemi-diaphragm, unable to wean oxygen with ambulation, have placed orders for home use Hypokalemia- improved GI- N/V resolved DM-sugars controlled Dispo- patient stable, will d/c home today if okay with Dr. Roxan Hockey, home oxygen has been ordered.   LOS: 5 days    Ellwood Handler, PA-C 08/19/2021 Patient seen and examined, agree with above Will need home O2 temporarily, should be able to wean off over next couple of weeks  Remo Lipps C. Roxan Hockey, MD [facility] [phone]

## 2021-08-21 ENCOUNTER — Other Ambulatory Visit: Payer: Self-pay

## 2021-08-21 ENCOUNTER — Ambulatory Visit (INDEPENDENT_AMBULATORY_CARE_PROVIDER_SITE_OTHER): Payer: 59 | Admitting: Family Medicine

## 2021-08-21 VITALS — BP 123/70 | HR 89 | Ht 63.0 in | Wt 208.2 lb

## 2021-08-21 DIAGNOSIS — Z09 Encounter for follow-up examination after completed treatment for conditions other than malignant neoplasm: Secondary | ICD-10-CM | POA: Diagnosis not present

## 2021-08-21 DIAGNOSIS — Z23 Encounter for immunization: Secondary | ICD-10-CM

## 2021-08-21 DIAGNOSIS — Z902 Acquired absence of lung [part of]: Secondary | ICD-10-CM

## 2021-08-21 LAB — TYPE AND SCREEN
ABO/RH(D): B POS
Antibody Screen: NEGATIVE
Unit division: 0
Unit division: 0
Unit division: 0

## 2021-08-21 LAB — BPAM RBC
Blood Product Expiration Date: 202210192359
Blood Product Expiration Date: 202210222359
Blood Product Expiration Date: 202211062359
ISSUE DATE / TIME: 202210140835
ISSUE DATE / TIME: 202210161405
Unit Type and Rh: 7300
Unit Type and Rh: 7300
Unit Type and Rh: 7300

## 2021-08-21 NOTE — Progress Notes (Signed)
    SUBJECTIVE:   CHIEF COMPLAINT / HPI: Hospital follow-up  Patient was recently admitted from 10/14-10/20 for pulmonary mass ultimately diagnosed as adenocarcinoma.  She underwent a robotic right upper lobe wedge resection followed by right upper lobectomy with lymph node dissection.  Chest tube was in place until 10/18.  She had persistent oxygen requirement so home oxygen was arranged.  Patient made the appointment today because she was unsure whether she should restart her regular medications because her discharge instructions were not clear to her.  She did start taking her oral medications.  She is currently on treatment for H. pylori.  She also wants to have her incision sites checked.  She has not noticed any significant drainage.  Her sister at home helps with the dressing.  PERTINENT  PMH / PSH: Former smoker (quit 2019), HTN, T2DM, HLD, OA  OBJECTIVE:   BP 123/70   Pulse 89   Ht 5\' 3"  (1.6 m)   Wt 208 lb 4 oz (94.5 kg)   SpO2 99% Comment: with oxygen  BMI 36.89 kg/m   General: Alert, sitting in chair with rolling walker, NAD CV: RRR, no murmurs Pulm: CTAB, no wheezes or rales, nasal cannula in place with portable oxygen Derm: 4 incision sites visualized along right flank, there is a small amount of surrounding erythema to the incision site most medial, otherwise incisions appear intact without any drainage.  Overlying dressing is clean dry and intact.  ASSESSMENT/PLAN:   S/P Robotic Assisted Right VATS with Right Lower Lobectomy, Lymph Node Dissection, Intercostal nerve block Appears to be doing well postoperatively.  Has follow-up appointment with surgery 11/1.  Advised she can resume her home medications.  Recommended daily dressing changes and OTC topical antibiotic.   HCM - flu vaccine given   Zola Button, MD [place]

## 2021-08-21 NOTE — Patient Instructions (Addendum)
It was nice seeing you today!  OK to restart all your medications.  Change dressings daily.  You can use a topical antibiotic to help prevent infection. Apply with dressing change.  Stay well, Zola Button, MD [place] [phone]

## 2021-08-21 NOTE — Assessment & Plan Note (Signed)
Appears to be doing well postoperatively.  Has follow-up appointment with surgery 11/1.  Advised she can resume her home medications.  Recommended daily dressing changes and OTC topical antibiotic.

## 2021-08-24 ENCOUNTER — Telehealth: Payer: Self-pay

## 2021-08-24 NOTE — Telephone Encounter (Signed)
Patient calls nurse line reporting symptoms of yeast infection. Patient reports she has been on 2 antibiotics since 10/12. Patient reports over the last several days she has noticed a "raw itchy" feeling after she showers. Patient denies any discharge, however states "I want to scratch and scratch down there." Will forward to PCP.

## 2021-08-27 NOTE — Telephone Encounter (Signed)
Patient calls nurse line again to check the status of request.

## 2021-08-28 ENCOUNTER — Other Ambulatory Visit: Payer: Self-pay | Admitting: *Deleted

## 2021-08-28 NOTE — Progress Notes (Signed)
The proposed treatment discussed in conference is for discussion purpose only and is not a binding recommendation.  The patients have not been physically examined, or presented with their treatment options.  Therefore, final treatment plans cannot be decided.  

## 2021-08-31 ENCOUNTER — Other Ambulatory Visit: Payer: Self-pay | Admitting: Thoracic Surgery (Cardiothoracic Vascular Surgery)

## 2021-08-31 DIAGNOSIS — R918 Other nonspecific abnormal finding of lung field: Secondary | ICD-10-CM

## 2021-09-01 ENCOUNTER — Encounter: Payer: Self-pay | Admitting: Thoracic Surgery (Cardiothoracic Vascular Surgery)

## 2021-09-01 ENCOUNTER — Ambulatory Visit (INDEPENDENT_AMBULATORY_CARE_PROVIDER_SITE_OTHER): Payer: Self-pay | Admitting: Thoracic Surgery (Cardiothoracic Vascular Surgery)

## 2021-09-01 ENCOUNTER — Other Ambulatory Visit: Payer: Self-pay

## 2021-09-01 ENCOUNTER — Ambulatory Visit
Admission: RE | Admit: 2021-09-01 | Discharge: 2021-09-01 | Disposition: A | Discharge status: Home / Self Care | Payer: PRIVATE HEALTH INSURANCE | Source: Ambulatory Visit | Attending: Thoracic Surgery (Cardiothoracic Vascular Surgery) | Admitting: Thoracic Surgery (Cardiothoracic Vascular Surgery)

## 2021-09-01 ENCOUNTER — Ambulatory Visit (INDEPENDENT_AMBULATORY_CARE_PROVIDER_SITE_OTHER): Payer: PRIVATE HEALTH INSURANCE | Admitting: Gastroenterology

## 2021-09-01 ENCOUNTER — Ambulatory Visit: Payer: PRIVATE HEALTH INSURANCE | Admitting: Thoracic Surgery (Cardiothoracic Vascular Surgery)

## 2021-09-01 VITALS — BP 138/80 | HR 85 | Resp 20 | Ht 63.0 in | Wt 201.0 lb

## 2021-09-01 DIAGNOSIS — Z23 Encounter for immunization: Secondary | ICD-10-CM | POA: Diagnosis not present

## 2021-09-01 DIAGNOSIS — Z09 Encounter for follow-up examination after completed treatment for conditions other than malignant neoplasm: Secondary | ICD-10-CM

## 2021-09-01 DIAGNOSIS — C3411 Malignant neoplasm of upper lobe, right bronchus or lung: Secondary | ICD-10-CM

## 2021-09-01 DIAGNOSIS — R918 Other nonspecific abnormal finding of lung field: Secondary | ICD-10-CM

## 2021-09-01 MED ORDER — PREGABALIN 25 MG PO CAPS: 25.0000 mg | ORAL_CAPSULE | Freq: Two times a day (BID) | ORAL | 1 refills | Status: DC

## 2021-09-01 MED ORDER — TRAMADOL HCL 50 MG PO TABS: 50.0000 mg | ORAL_TABLET | Freq: Four times a day (QID) | ORAL | 0 refills | Status: DC | PRN

## 2021-09-01 NOTE — Progress Notes (Signed)
[address]       [place] [address]             [phone]     HPI: [name] returns for a scheduled postoperative follow-up visit.  Sonya Walton is a 60 year old woman with a history of tobacco abuse, hypertension, hyperlipidemia, type 2 diabetes, arthritis, and thyroid nodule.  She has been followed for a right upper lobe lung nodule since November 2021.  Navigational bronchoscopy was nondiagnostic but did show some atypical cells.  The nodule continued to increase in size.  I did a robotic assisted right upper lobectomy and node dissection on 08/14/2021.  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  She did well postoperatively and went home on day 4.  She complains of some paresthesias in the right costal margin region.  She has been taking Tylenol on a regular basis and taking 50 mg of tramadol about every 6 hours.  She has not had any respiratory issues.  Past Medical History:  Diagnosis Date   Allergy    Arthritis    Coronary artery disease    mild by CT 02/05/21   Diabetes mellitus without complication (HCC)    Hoarse voice quality 01/19/2016   Hypertension    states under control with med., has been on med. since 2011   Immature cataract    Osteoarthritis 12/2015   right AC joint   Partial tear of right rotator cuff 12/2015   Thyroid nodule 2021   Wears dentures    upper    Current Outpatient Medications  Medication Sig Dispense Refill   pregabalin (LYRICA) 25 MG capsule Take 1 capsule (25 mg total) by mouth 2 (two) times daily. 60 capsule 1   acetaminophen (TYLENOL) 500 MG tablet Take 1,000 mg by mouth every 6 (six) hours as needed for mild pain or moderate pain.     aspirin EC 81 MG tablet Take 1 tablet (81 mg total) by mouth daily. Swallow whole. 90 tablet 3   atenolol-chlorthalidone (TENORETIC) 50-25 MG tablet TAKE 1 TABLET BY MOUTH EVERY DAY (Patient taking differently: Take 1 tablet by mouth in the morning.) 90 tablet 3    blood glucose meter kit and supplies KIT Inject 1 each into the skin as directed. Use meter once daily to measure blood glucose. 1 each 0   cetirizine (ZYRTEC) 10 MG tablet Take 10 mg by mouth daily as needed for allergies. (Patient not taking: Reported on 08/21/2021)     Cholecalciferol (VITAMIN D3) 2000 UNITS TABS Take 2,000 Units by mouth every morning.     Dulaglutide 3 MG/0.5ML SOPN Inject 3 mg into the skin once a week. (Patient taking differently: Inject 3 mg into the skin every Thursday.) 2 mL 11   empagliflozin (JARDIANCE) 10 MG TABS tablet Take 10 mg by mouth in the morning.     glucose blood ([employer] ADVANCED GLUCOSE TEST) test strip Please specify directions, refills and quantity\ Once daily, ZBQ:[id number], 3 refills, 100 quantity Dr Towanda Octave MD 100 each 0   Insulin Glargine (BASAGLAR KWIKPEN) 100 UNIT/ML Inject 16 Units into the skin daily. (Patient taking differently: Inject 25 Units into the skin daily.)     Insulin Pen Needle (PEN NEEDLES) 32G X 6 MM MISC 1 each by Does not apply route daily. 50 each 6   nitroGLYCERIN (NITROSTAT) 0.4 MG SL tablet Place 1 tablet (0.4 mg total) under the tongue every 5 (five) minutes as needed for  chest pain. 30 tablet 12   omeprazole (PRILOSEC) 20 MG capsule Take 1 capsule (20 mg total) by mouth 2 (two) times daily before a meal for 14 days. 28 capsule 0   potassium chloride SA (KLOR-CON) 20 MEQ tablet Take 2 tablets (40 mEq total) by mouth daily. 180 tablet 3   RELION PEN NEEDLES 32G X 4 MM MISC USE 1 STRIP TO CHECK GLUCOSE ONCE DAILY 50 each 0   rosuvastatin (CRESTOR) 10 MG tablet Take 1 tablet (10 mg total) by mouth daily. 90 tablet 3   traMADol (ULTRAM) 50 MG tablet Take 1-2 tablets (50-100 mg total) by mouth every 6 (six) hours as needed (mild pain). 30 tablet 0   No current facility-administered medications for this visit.    Physical Exam BP 138/80 (BP Location: Right Arm, Patient Position: Sitting)   Pulse 85   Resp 20   Ht $R'5\' 3"'as$   (1.6 m)   Wt 201 lb (91.2 kg)   SpO2 90% Comment: RA  BMI 35.64 kg/m  60 year old woman in no acute distress Alert and oriented x3 with no focal deficits Lungs diminished to right base but otherwise clear Cardiac regular rate and rhythm Incisions clean dry and intact No peripheral edema  Diagnostic Tests: CHEST - 2 VIEW   COMPARISON:  Chest radiograph 08/19/2021, chest CT 06/13/2021   FINDINGS: Unchanged cardiomediastinal silhouette. Postsurgical changes of right upper lobectomy, with rightward tracheal shift from volume loss. Similar right perihilar opacities. There is a small right pleural effusion with apical pleural thickening and likely some fluid in the apex. Trace residual gaseous component in the apex. No acute osseous abnormality.   IMPRESSION: Postsurgical changes of right upper lobectomy, with similar right perihilar opacities, and small right pleural effusion with trace residual gaseous component in the apex.     Electronically Signed   By: Maurine Simmering M.D.   On: 09/01/2021 09:31 I personally reviewed the chest x-ray.  There are postop changes on right.  Impression: Sonya Walton is a 60 year old former smoker who underwent a robotic assisted right upper lobectomy and node dissection on 08/14/2021.  The nodule turned out to be a T1, N0, stage Ia adenocarcinoma.  She did well postoperatively and went home on day 4.  Overall she is doing well.  She does have some paresthesias.  She is taking tramadol about every 6 hours on the clock.  She is only taking 50 mg.  She also is using Tylenol.  Think she might benefit from Lyrica.  I Minna start her on a low-dose of 25 mg twice daily to see how she tolerates it.  She can continue to use Tylenol and tramadol as needed.  She has stage Ia disease.  She will not need adjuvant chemo or radiation.  I am going to refer her to oncology so that she can get into the system for follow-up.  Plan: Lyrica 25 mg twice daily Refill  tramadol 50 mg every 6 hours as needed.  30 tablets.  No refills.  She will call if she needs additional refills. Refer to Dr. Julien Nordmann at [facility] I will see her back in 2 months with a chest x-ray.  [name], MD [facility] [phone]

## 2021-09-02 ENCOUNTER — Telehealth: Payer: Self-pay | Admitting: *Deleted

## 2021-09-02 DIAGNOSIS — Z902 Acquired absence of lung [part of]: Secondary | ICD-10-CM

## 2021-09-02 NOTE — Telephone Encounter (Signed)
I received referral from Dr.  Roxan Hockey. I called and updated patient on her appt. She verbalized understanding.

## 2021-09-07 ENCOUNTER — Other Ambulatory Visit: Payer: Self-pay

## 2021-09-07 ENCOUNTER — Other Ambulatory Visit: Payer: PRIVATE HEALTH INSURANCE

## 2021-09-07 ENCOUNTER — Encounter: Payer: Self-pay | Admitting: Pharmacist

## 2021-09-07 ENCOUNTER — Ambulatory Visit (INDEPENDENT_AMBULATORY_CARE_PROVIDER_SITE_OTHER): Payer: 59 | Admitting: Pharmacist

## 2021-09-07 VITALS — BP 138/80 | HR 83 | Ht 63.0 in | Wt 195.6 lb

## 2021-09-07 DIAGNOSIS — E041 Nontoxic single thyroid nodule: Secondary | ICD-10-CM

## 2021-09-07 DIAGNOSIS — E1165 Type 2 diabetes mellitus with hyperglycemia: Secondary | ICD-10-CM | POA: Diagnosis not present

## 2021-09-07 MED ORDER — FLUCONAZOLE 150 MG PO TABS: 150.0000 mg | ORAL_TABLET | Freq: Once | ORAL | 0 refills | Status: AC

## 2021-09-07 NOTE — Progress Notes (Signed)
I am precepting today and was asked about patient's thyroid nodule evaluated on 10/03/20 with ultrasound. At that time, recommend was repeat u/s in 1 year. As PCP is out on parental leave I have placed an order for follow up ultrasound. Will send message to [facility] staff to get this scheduled and contact patient with appointment.  Leeanne Rio, MD

## 2021-09-07 NOTE — Progress Notes (Signed)
S:    Patient arrives in a pleasant mood, ambulating without assistance.  Presents for diabetes evaluation, education, and management. Patient was referred and last seen by Primary Care Provider Dr. Posey Pronto on 01/19/2021. Patient was referred on 08/21/2021 by Dr. Nancy Fetter.     Patient recently underwent a lobectomy on 08/14/2021. She appears to be doing well and is recovering as expected.   Patient has been experiencing a yeast infection since 08/12/2021, likely due to combined use of antibiotics and Jardiance (empagliflozin).   Patient also reports decreased appetite, only being able to eat small amounts of food before feeling full.   Patient requested follow-up for Thyroid scan which was scheduled for 1 year repeat ~ 12 months ago.  (Discussed and deferred ordering of thyroid ultrasound to attending physician - Dr. Ardelia Mems).   Medication adherence reported good .   Current diabetes medications include: Trulicity (dulaglutide) 3 mg once weekly, Jardiance (empagliflozin) 10 mg daily, Basaglar (insulin glargine) 25 units daily  Current hypertension medications include: atenolol-chlorthalidone 50-25 mg once daily  Current hyperlipidemia medications include: rosuvastatin 10 mg daily   Patient denies hypoglycemic events.  Patient reported dietary habits: Eats 3 meals/day, however they are small portions Breakfast:scrambled egg, bacon  Lunch: sandwich  Dinner: small dinner    Patient denies nocturia (nighttime urination).  Patient denies neuropathy (nerve pain). Patient denies visual changes. Patient denies self foot exams.     O:  Physical Exam Constitutional:      Appearance: Normal appearance.  Pulmonary:     Effort: Pulmonary effort is normal.  Neurological:     Mental Status: She is alert.  Psychiatric:        Mood and Affect: Mood normal.    Review of Systems  Neurological:  Positive for weakness (fatigue post-operatively).  All other systems reviewed and are  negative.  Lab Results  Component Value Date   HGBA1C 6.8 (H) 08/12/2021   Vitals:   09/07/21 1101  BP: 138/80  Pulse: 83  SpO2: 98%    Lipid Panel     Component Value Date/Time   CHOL 122 06/29/2021 1022   TRIG 107 06/29/2021 1022   HDL 40 02/26/2021 0820   CHOLHDL 2.6 02/26/2021 0820   CHOLHDL 3.6 04/12/2016 0953   VLDL 24 04/12/2016 0953   LDLCALC 47 02/26/2021 0820   LDLCALC 95 10/05/2012 1817    Home fasting blood sugars: 117 (10/31), 104 (11/1), 99 (11/2), 107 (11/3), 81 (11/4), 126 (11/5), 114 (11/6), 99 (11/7)     A/P: Diabetes longstanding currently well controlled. Patient is able to verbalize appropriate hypoglycemia management plan. Medication adherence appears good. Due to excellent fasting blood glucose levels, no changes to diabetes regimen is warranted at this time. However, due to current yeast infection, Jardiance (empagliflozin) will be stopped for a week to allow for effective treatment with Diflucan (fluconazole).  -Stop Jardiance (empagliflozin) for a week, if yeast infection is resolved restart Jardiance (empagliflozin) on Sunday 09/13/2021 -Start Diflucan (fluconazole) 150 mg one tablet today and repeat in 72 hours (Discussed and approved by attending physician Dr. Ardelia Mems) - If home blood sugars are consistently <90, patient was informed to call and set up an earlier appointment to discuss potential changes to insulin regimen  -Extensively discussed pathophysiology of diabetes, recommended lifestyle interventions, dietary effects on blood sugar control -Counseled on s/sx of and management of hypoglycemia -Next A1C anticipated in January 2023.   Written patient instructions provided.  Total time in face to face counseling 30  minutes.   Follow up Pharmacist Clinic Visit in January 2023. Patient seen with Elyse Jarvis, PharmD Candidate.

## 2021-09-07 NOTE — Assessment & Plan Note (Signed)
Diabetes longstanding currently well controlled. Patient is able to verbalize appropriate hypoglycemia management plan. Medication adherence appears good. Due to excellent fasting blood glucose levels, no changes to diabetes regimen is warranted at this time. However, due to current yeast infection, Jardiance (empagliflozin) will be stopped for a week to allow for effective treatment with Diflucan (fluconazole).  -Stop Jardiance (empagliflozin) for a week, if yeast infection is resolved restart Jardiance (empagliflozin) on Sunday 09/13/2021 -Start Diflucan (fluconazole) 150 mg one tablet today and repeat in 72 hours  - If home blood sugars are consistently <90, patient was informed to call and set up an earlier appointment to discuss potential changes to insulin regimen  -Extensively discussed pathophysiology of diabetes, recommended lifestyle interventions, dietary effects on blood sugar control -Counseled on s/sx of and management of hypoglycemia -Next A1C anticipated in January 2023.

## 2021-09-07 NOTE — Patient Instructions (Addendum)
It was nice seeing you today!   Stop Jardiance (empagliflozin) for a week, if yeast infection is resolved restart Jardiance (empagliflozin) on Sunday 09/13/2021  Start Diflucan (fluconazole) 150 mg one today and repeat in 72 hours   If you are consistently having blood sugars <90, please call so that we can set up an appointment and discuss changes to your insulin  Follow up with Dr. Posey Pronto in January

## 2021-09-08 ENCOUNTER — Ambulatory Visit: Payer: PRIVATE HEALTH INSURANCE | Admitting: Thoracic Surgery (Cardiothoracic Vascular Surgery)

## 2021-09-08 ENCOUNTER — Other Ambulatory Visit: Payer: PRIVATE HEALTH INSURANCE

## 2021-09-08 DIAGNOSIS — K2970 Gastritis, unspecified, without bleeding: Secondary | ICD-10-CM

## 2021-09-09 LAB — HELICOBACTER PYLORI  SPECIAL ANTIGEN
MICRO NUMBER:: 12608854
SPECIMEN QUALITY: ADEQUATE

## 2021-09-09 NOTE — Progress Notes (Signed)
[place] [phone]   [phone]  CONSULT NOTE  REFERRING PHYSICIAN: Dr. Roxan Hockey  REASON FOR CONSULTATION:  Stage IA non-small cell lung cancer  HPI Sonya Walton is a 60 y.o. female with a past medical history significant for tobacco abuse, hypertension, hyperlipidemia, diabetes, arthritis, thyroid nodule, and angina is referred to the clinic for newly diagnosed non-small cell lung cancer.  The patient had a low-dose lung cancer screen performed on 09/05/2020 which showed an irregular ill-defined peripheral opacity in the right upper lobe, possibly postinfectious/inflammatory but neoplasm cannot be excluded.  A follow-up CT scan was performed in February 2022 which showed irregular some solid peripheral atypical right upper lobe 3.1 cm mass suspicious for bronchogenic carcinoma.  The patient had a PET scan performed on 01/05/2021 which did not have any hypermetabolism but increased in size slightly from 09/05/2020 and remains worrisome for low-grade adenocarcinoma.  Dr. Lamonte Sakai from pulmonology underwent a navigational bronchoscopy.  The final pathology was showed atypical cells but was not diagnostic for carcinoma.  They continue to follow this nodule on routine CT scan which showed continual slight enlargement of the right upper lobe nodule compared to prior imaging.  The patient met with Dr. Roxan Hockey and underwent a robotic assisted right upper lobectomy on 08/14/2021.  The final pathology showed invasive moderately differentiated adenocarcinoma measuring 1.6 cm. There was another focus of adenocarcinoma measuring 0.3 cm. Reviewed with Dr. Melina Copa who confirmed this is one focus of adenocarcinoma as opposed to two focuses. The procedure started with a wedge resection followed by complete lobectomy. There were 13 lymph nodes examined were all negative for carcinoma.  The final pathologic stage was a pT1b, PN 0 stage 1A.   Overall, the patient is  doing well following her lobectomy except for some paresthesias over the right costal margin region for which she takes Tylenol or tramadol if needed.  She estimates she takes tramadol BID. She also has been taking lyrica. She has some constipation with taking tramadol and has been using Senakot. She denies any fever, chills, night sweats, or unexplained weight loss except for some weight loss after surgery. She denies any hemoptysis. She states she coughs "every once in awhile". She reports some dyspnea on exertion if she is active. She denies any nausea, vomiting, or diarrhea. Denies any headache or visual changes.   Her family history consists of a mother who had heart disease, hypertension, and Alzheimer's.  The patient's maternal aunt had lung cancer.  The patient's grandfather had carcinoma but the patient is unsure of the primary site.  The patient's father had lung cancer.  The patient's brother had lung cancer.  The patient has a sister who had breast cancer at the age of 41.  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  The patient is widowed.  She is accompanied by her son today.  The patient smoked 32 years having quit in 2019.  She averaged 1 pack of cigarettes per day.  She denies any alcohol or drug use.   HPI  Past Medical History:  Diagnosis Date   Allergy    Arthritis    Coronary artery disease    mild by CT 02/05/21   Diabetes mellitus without complication ([place])    Hoarse voice quality 01/19/2016   Hypertension    states under control with med., has  been on med. since 2011   Immature cataract    Osteoarthritis 12/2015   right AC joint   Partial tear of right rotator cuff 12/2015   Thyroid nodule 2021   Wears dentures    upper    Past Surgical History:  Procedure Laterality Date   BREAST BIOPSY Left    BRONCHIAL BIOPSY  03/09/2021    Procedure: BRONCHIAL BIOPSIES;  Surgeon: Collene Gobble, MD;  Location: [name] ENDOSCOPY;  Service: Pulmonary;;   BRONCHIAL BRUSHINGS  03/09/2021   Procedure: BRONCHIAL BRUSHINGS;  Surgeon: Collene Gobble, MD;  Location: [facility] ENDOSCOPY;  Service: Pulmonary;;   BRONCHIAL NEEDLE ASPIRATION BIOPSY  03/09/2021   Procedure: BRONCHIAL NEEDLE ASPIRATION BIOPSIES;  Surgeon: Collene Gobble, MD;  Location: [place] ENDOSCOPY;  Service: Pulmonary;;   BUNIONECTOMY Bilateral 1994   CHOLECYSTECTOMY  01/17/2012   Procedure: LAPAROSCOPIC CHOLECYSTECTOMY;  Surgeon: Harl Bowie, MD;  Location: [place];  Service: General;  Laterality: N/A;   FIDUCIAL MARKER PLACEMENT  03/09/2021   Procedure: FIDUCIAL MARKER PLACEMENT;  Surgeon: Collene Gobble, MD;  Location: [facility] ENDOSCOPY;  Service: Pulmonary;;   INTERCOSTAL NERVE BLOCK Right 08/14/2021   Procedure: INTERCOSTAL NERVE BLOCK;  Surgeon: [name], MD;  Location: [facility] OR;  Service: Thoracic;  Laterality: Right;   NODE DISSECTION Right 08/14/2021   Procedure: NODE DISSECTION;  Surgeon: [name], MD;  Location: [facility] OR;  Service: Thoracic;  Laterality: Right;   PUBOVAGINAL SLING  03/05/2003   SHOULDER ARTHROSCOPY WITH DISTAL CLAVICLE RESECTION Right 01/26/2016   Procedure: SHOULDER ARTHROSCOPY WITH DISTAL CLAVICLE RESECTION;  Surgeon: Tania Ade, MD;  Location: [place];  Service: Orthopedics;  Laterality: Right;   SHOULDER ARTHROSCOPY WITH SUBACROMIAL DECOMPRESSION Right 01/26/2016   Procedure: SHOULDER ARTHROSCOPY WITH SUBACROMIAL DECOMPRESSION DEBRIDEMENT;  Surgeon: Tania Ade, MD;  Location: [place];  Service: Orthopedics;  Laterality: Right;   TONSILLECTOMY  1975   TOTAL VAGINAL HYSTERECTOMY  03/05/2003   TUBAL LIGATION  1981   VAGINAL HYSTERECTOMY  2004   partial    VIDEO BRONCHOSCOPY WITH ENDOBRONCHIAL NAVIGATION N/A 03/09/2021   Procedure: VIDEO BRONCHOSCOPY WITH ENDOBRONCHIAL NAVIGATION;  Surgeon: Collene Gobble, MD;  Location: [place] ENDOSCOPY;  Service: Pulmonary;  Laterality: N/A;    Family History  Problem Relation Age of Onset   Liver cancer Father    Cancer Father        lung and liver   Heart disease Mother    Hyperlipidemia Mother    Hypertension Mother    Heart failure Mother    Hypertension Other    Diabetes Other    Asthma Other    Diabetes type II Sister    Breast cancer Sister    Asthma Sister    COPD Sister    Diabetes type II Brother    Cancer Paternal Aunt        lung   Hypertension Sister    Hypertension Brother    Diabetes Brother    Asthma Brother    Cancer Other        lung   Colon polyps Neg Hx    Esophageal cancer Neg Hx    Rectal cancer Neg Hx    Stomach cancer Neg Hx     Social History Social History   Tobacco Use   Smoking status: Former    Packs/day: 1.00    Years: 37.00    Pack years: 37.00    Types: Cigarettes    Start date: 11/01/1981  Quit date: 01/09/2018    Years since quitting: 3.6   Smokeless tobacco: Never   Tobacco comments:    1 ppd max use.   Vaping Use   Vaping Use: Never used  Substance Use Topics   Alcohol use: Yes    Alcohol/week: 0.0 standard drinks    Comment: rarely   Drug use: No    Allergies  Allergen Reactions   Percocet [Oxycodone-Acetaminophen] Other (See Comments)    Dysphoria    Current Outpatient Medications  Medication Sig Dispense Refill   acetaminophen (TYLENOL) 500 MG tablet Take 500 mg by mouth every 6 (six) hours as needed for mild pain or moderate pain.     aspirin EC 81 MG tablet Take 1 tablet (81 mg total) by mouth daily. Swallow whole. 90 tablet 3   atenolol-chlorthalidone (TENORETIC) 50-25 MG tablet TAKE 1 TABLET BY MOUTH EVERY DAY (Patient taking differently: Take 1 tablet by mouth in the morning.) 90 tablet 3   blood glucose meter kit and supplies KIT Inject 1 each into the skin as directed. Use meter once daily to measure blood glucose. 1 each 0   cetirizine (ZYRTEC) 10 MG tablet Take 10 mg  by mouth daily as needed for allergies.     Cholecalciferol (VITAMIN D3) 2000 UNITS TABS Take 2,000 Units by mouth every morning.     Dulaglutide 3 MG/0.5ML SOPN Inject 3 mg into the skin once a week. (Patient taking differently: Inject 3 mg into the skin every Thursday.) 2 mL 11   empagliflozin (JARDIANCE) 10 MG TABS tablet Take 10 mg by mouth in the morning.     fluconazole (DIFLUCAN) 150 MG tablet Take by mouth.     glucose blood ([employer] ADVANCED GLUCOSE TEST) test strip Please specify directions, refills and quantity\ Once daily, [id number], 3 refills, 100 quantity Dr Lattie Haw MD 100 each 0   Insulin Glargine (BASAGLAR KWIKPEN) 100 UNIT/ML Inject 16 Units into the skin daily. (Patient taking differently: Inject 25 Units into the skin daily.)     Insulin Pen Needle (PEN NEEDLES) 32G X 6 MM MISC 1 each by Does not apply route daily. 50 each 6   nitroGLYCERIN (NITROSTAT) 0.4 MG SL tablet Place 1 tablet (0.4 mg total) under the tongue every 5 (five) minutes as needed for chest pain. 30 tablet 12   potassium chloride SA (KLOR-CON) 20 MEQ tablet Take 2 tablets (40 mEq total) by mouth daily. 180 tablet 3   pregabalin (LYRICA) 25 MG capsule Take 1 capsule (25 mg total) by mouth 2 (two) times daily. 60 capsule 1   RELION PEN NEEDLES 32G X 4 MM MISC USE 1 STRIP TO CHECK GLUCOSE ONCE DAILY 50 each 0   rosuvastatin (CRESTOR) 10 MG tablet Take 1 tablet (10 mg total) by mouth daily. 90 tablet 3   traMADol (ULTRAM) 50 MG tablet Take 1-2 tablets (50-100 mg total) by mouth every 6 (six) hours as needed (mild pain). (Patient taking differently: Take 50-100 mg by mouth every 6 (six) hours as needed (mild pain). Taking 1 in the morning and 1 at night) 30 tablet 0   No current facility-administered medications for this visit.    REVIEW OF SYSTEMS:   Review of Systems  Constitutional: Negative for appetite change, chills, fatigue, fever and unexpected weight change.  HENT: Negative for mouth sores,  nosebleeds, sore throat and trouble swallowing.   Eyes: Negative for eye problems and icterus.  Respiratory: Positive for mild occasional cough and dyspnea on exertion.  Negative for hemoptysis and wheezing.   Cardiovascular: Positive for postsurgical paresthesias/pain.  Negative for leg swelling.  Gastrointestinal: Negative for abdominal pain, constipation, diarrhea, nausea and vomiting.  Genitourinary: Negative for bladder incontinence, difficulty urinating, dysuria, frequency and hematuria.   Musculoskeletal: Negative for back pain, gait problem, neck pain and neck stiffness.  Skin: Negative for itching and rash.  Neurological: Negative for dizziness, extremity weakness, gait problem, headaches, light-headedness and seizures.  Hematological: Negative for adenopathy. Does not bruise/bleed easily.  Psychiatric/Behavioral: Negative for confusion, depression and sleep disturbance. The patient is not nervous/anxious.     PHYSICAL EXAMINATION:  Blood pressure 136/74, pulse 79, temperature 98.1 F (36.7 C), temperature source Temporal, resp. rate 17, height _0  (1.6 m), weight 194 lb 12.8 oz (88.4 kg), SpO2 100 %.  ECOG PERFORMANCE STATUS: 1  Physical Exam  Constitutional: Oriented to person, place, and time and well-developed, well-nourished, and in no distress.  HENT:  Head: Normocephalic and atraumatic.  Mouth/Throat: Oropharynx is clear and moist. No oropharyngeal exudate.  Eyes: Conjunctivae are normal. Right eye exhibits no discharge. Left eye exhibits no discharge. No scleral icterus.  Neck: Normal range of motion. Neck supple.  Cardiovascular: Normal rate, regular rhythm, normal heart sounds and intact distal pulses.   Pulmonary/Chest: Effort normal and breath sounds normal. No respiratory distress. No wheezes. No rales.  Abdominal: Soft. Bowel sounds are normal. Exhibits no distension and no mass. There is no tenderness.  Musculoskeletal: Normal range of motion. Exhibits no edema.   Lymphadenopathy:    No cervical adenopathy.  Neurological: Alert and oriented to person, place, and time. Exhibits normal muscle tone. Gait normal. Coordination normal.  Skin: Skin is warm and dry. No rash noted. Not diaphoretic. No erythema. No pallor.  Psychiatric: Mood, memory and judgment normal.  Vitals reviewed.  LABORATORY DATA: Lab Results  Component Value Date   WBC 8.2 09/14/2021   HGB 14.1 09/14/2021   HCT 43.7 09/14/2021   MCV 87.1 09/14/2021   PLT 272 09/14/2021      Chemistry      Component Value Date/Time   NA 137 09/14/2021 1306   NA 139 06/18/2021 1006   K 3.8 09/14/2021 1306   CL 101 09/14/2021 1306   CO2 28 09/14/2021 1306   BUN 7 (L) 09/14/2021 1306   BUN 9 06/18/2021 1006   CREATININE 0.75 09/14/2021 1306   CREATININE 0.69 04/12/2016 0953   GLU 160 (H) 06/29/2021 1022      Component Value Date/Time   CALCIUM 9.5 09/14/2021 1306   ALKPHOS 119 09/14/2021 1306   AST 57 (H) 09/14/2021 1306   ALT 43 09/14/2021 1306   BILITOT 1.0 09/14/2021 1306       RADIOGRAPHIC STUDIES: DG Chest 1 View  Result Date: 08/16/2021 CLINICAL DATA:  Right upper lobectomy on 08/14/2021 EXAM: CHEST  1 VIEW COMPARISON:  1 day prior FINDINGS: Right internal jugular line tip at low SVC. Right-sided chest tube directed towards the apex, similar. Minimal tracheal deviation right. Mild cardiomegaly. Atherosclerosis in the transverse aorta. Right hemidiaphragm elevation. Right apical pleural fluid or thickening. No pneumothorax. Soft tissue fullness in the right suprahilar region is likely due to postoperative atelectasis and is similar. Clear left lung. IMPRESSION: Similar appearance of right apical pleural thickening or fluid in the setting of right-sided chest tube. No pneumothorax or other acute complication. Aortic Atherosclerosis (ICD10-I70.0). Electronically Signed   By: Abigail Miyamoto M.D.   On: 08/16/2021 08:09   DG Chest 2 View  Result Date:  09/01/2021 CLINICAL DATA:  mass  of right lung EXAM: CHEST - 2 VIEW COMPARISON:  Chest radiograph 08/19/2021, chest CT 06/13/2021 FINDINGS: Unchanged cardiomediastinal silhouette. Postsurgical changes of right upper lobectomy, with rightward tracheal shift from volume loss. Similar right perihilar opacities. There is a small right pleural effusion with apical pleural thickening and likely some fluid in the apex. Trace residual gaseous component in the apex. No acute osseous abnormality. IMPRESSION: Postsurgical changes of right upper lobectomy, with similar right perihilar opacities, and small right pleural effusion with trace residual gaseous component in the apex. Electronically Signed   By: Maurine Simmering M.D.   On: 09/01/2021 09:31   DG Chest 2 View  Result Date: 08/19/2021 CLINICAL DATA:  S/p lobectomy of lung EXAM: CHEST - 2 VIEW COMPARISON:  08/18/2021 FINDINGS: Persistent right apical pneumothorax, lung apex projecting at the level of the posterior aspect right third rib as before. Postop changes of the right hilum. Left lung clear. Heart size normal. Blunting of posterior right costophrenic angle suggesting small effusion. Aortic Atherosclerosis (ICD10-170.0). Visualized bones unremarkable. IMPRESSION: Stable small right apical pneumothorax. Electronically Signed   By: Lucrezia Europe M.D.   On: 08/19/2021 07:31   DG Chest Port 1 View  Result Date: 08/18/2021 CLINICAL DATA:  RIGHT chest tube removal. EXAM: PORTABLE CHEST 1 VIEW COMPARISON:  August 18, 2021 FINDINGS: The cardiomediastinal silhouette is unchanged in contour with unchanged postsurgical contours of the RIGHT hilum.Atherosclerotic calcifications. No pleural effusion. Persistent small RIGHT apical pneumothorax, similar in comparison to prior status post RIGHT chest tube removal. Elevation of the RIGHT hemidiaphragm. Scattered bibasilar atelectasis. Visualized abdomen is unremarkable. IMPRESSION: Persistent small RIGHT apical pneumothorax, similar in comparison to prior status  post RIGHT chest tube removal Electronically Signed   By: Valentino Saxon M.D.   On: 08/18/2021 14:53   DG CHEST PORT 1 VIEW  Result Date: 08/18/2021 CLINICAL DATA:  Follow-up lobectomy EXAM: PORTABLE CHEST 1 VIEW COMPARISON:  08/17/2021 FINDINGS: The left chest remains clear. Lobectomy on the right. Right chest tube in place. Tiny amount of pleural air at the right pleural apex. Right internal jugular central line tip in the SVC above the right atrium. IMPRESSION: Persistent small right apical pneumothorax following lobectomy on the right. Electronically Signed   By: Nelson Chimes M.D.   On: 08/18/2021 08:39   DG CHEST PORT 1 VIEW  Result Date: 08/17/2021 CLINICAL DATA:  Status post lung surgery.  Chest tube present. EXAM: PORTABLE CHEST 1 VIEW COMPARISON:  08/16/2021 FINDINGS: Stable position of the right chest tube near the medial right lung apex. Aeration at the right lung apex is slightly improved. Negative for a large pneumothorax but limited evaluation at the apical region. Stable position of the right jugular Port-A-Cath with the tip in the SVC region. Again noted is volume loss in the right hemithorax with patchy densities at the right lung base. Left lung is clear. Heart size is stable. IMPRESSION: 1. Stable position of the right chest tube. Slightly improved aeration at the right lung apex. Difficult to exclude a small right apical pneumothorax but no evidence for a large pneumothorax. 2. Stable volume loss in the right hemithorax. Electronically Signed   By: Markus Daft M.D.   On: 08/17/2021 10:35    ASSESSMENT/Plan: This is a very pleasant 60 year old female diagnosed with stage Ia (T1b, N0, M0) non-small cell lung cancer, adenocarcinoma.  The patient presented with a right upper lobe lung nodule.  The patient was diagnosed in October 2022.  The  patient underwent a right upper lobectomy and lymph node dissection under the care of Dr. Roxan Hockey on 08/14/2021.  The patient was seen with  Dr. Julien Nordmann today.  Dr. Julien Nordmann had a lengthy discussion today with the patient about her current condition and recommended treatment options.  Dr. Julien Nordmann discussed that for stage Ia, no adjuvant chemotherapy is recommended.  Dr. Julien Nordmann recommends that the patient continue on observation with restaging CT scan of the chest in 6 months.  I will arrange for restaging CT scan the chest in 6 months.  We will see him back for follow-up visit in 6 months for evaluation and to review her scan results.  The patient voices understanding of current disease status and treatment options and is in agreement with the current care plan.  All questions were answered. The patient knows to call the clinic with any problems, questions or concerns. We can certainly see the patient much sooner if necessary.  Thank you so much for allowing me to participate in the care of [place]. I will continue to follow up the patient with you and assist in her care.   Disclaimer: This note was dictated with voice recognition software. Similar sounding words can inadvertently be transcribed and may not be corrected upon review.   Armanie Ullmer L Marceline Napierala September 14, 2021, 2:43 PM  ADDENDUM: Hematology/Oncology Attending: I had a face-to-face encounter with the patient today.  I reviewed her record, lab, scans and recommended her care plan.  This is a very pleasant 60 years old white female with past medical history significant for hypertension, diabetes mellitus, dyslipidemia, thyroid nodule as well as history of angina and tobacco abuse.  The patient underwent CT screening a scan of the chest for September 05, 2020 that was suspicious at that time to be postinfectious/inflammatory in origin.  Follow-up CT scan in February 2022 showed persistent of the irregular partially solid peripheral right upper lobe 3.1 cm mass suspicious for primary bronchogenic carcinoma.  A PET scan on January 05, 2021 showed mild hypermetabolic  activity but there was further increase in the size of the mass.  She underwent bronchoscopy at that time under the care of Dr. Lamonte Sakai and it was not conclusive for malignancy but showed atypical cells.  The patient was followed by observation and repeat imaging studies that showed further increase in the size of the right upper lobe nodule. She was referred to Dr. Roxan Hockey and on August 14, 2021 she underwent robotic right upper lobectomy with lymph node dissection.  The final pathology was consistent with invasive moderately differentiated adenocarcinoma measuring 1.6 cm with negative resection margin and no evidence for vascular or pleural invasion and the dissected lymph nodes were negative for malignancy. I had a lengthy discussion with the patient today about her current disease stage, prognosis and treatment options.  The patient has a stage Ia (T1b, N0, M0) non-small cell lung cancer adenocarcinoma. I discussed with the patient her condition and I explained to her that she had curable resection of her disease and the current standard of care for patient with a stage Ia is observation and close monitoring. I will arrange for the patient to have repeat CT scan of the chest in 6 months for restaging of her disease. She was advised to call immediately if she has any other concerning symptoms in the interval. The total time spent in the appointment was 60 minutes.  Disclaimer: This note was dictated with voice recognition software. Similar sounding words can inadvertently be  transcribed and may be missed upon review. Eilleen Kempf, MD 09/14/21

## 2021-09-14 ENCOUNTER — Telehealth: Payer: Self-pay

## 2021-09-14 ENCOUNTER — Encounter: Payer: Self-pay | Admitting: Physician Assistant

## 2021-09-14 ENCOUNTER — Inpatient Hospital Stay: Payer: 59

## 2021-09-14 ENCOUNTER — Other Ambulatory Visit: Payer: Self-pay

## 2021-09-14 ENCOUNTER — Inpatient Hospital Stay: Payer: 59 | Attending: Physician Assistant | Admitting: Physician Assistant

## 2021-09-14 VITALS — BP 136/74 | HR 79 | Temp 98.1°F | Resp 17 | Ht 63.0 in | Wt 194.8 lb

## 2021-09-14 DIAGNOSIS — C3411 Malignant neoplasm of upper lobe, right bronchus or lung: Secondary | ICD-10-CM

## 2021-09-14 DIAGNOSIS — Z87891 Personal history of nicotine dependence: Secondary | ICD-10-CM | POA: Diagnosis not present

## 2021-09-14 DIAGNOSIS — E119 Type 2 diabetes mellitus without complications: Secondary | ICD-10-CM

## 2021-09-14 DIAGNOSIS — Z801 Family history of malignant neoplasm of trachea, bronchus and lung: Secondary | ICD-10-CM | POA: Diagnosis not present

## 2021-09-14 DIAGNOSIS — I10 Essential (primary) hypertension: Secondary | ICD-10-CM

## 2021-09-14 DIAGNOSIS — Z902 Acquired absence of lung [part of]: Secondary | ICD-10-CM

## 2021-09-14 DIAGNOSIS — C3490 Malignant neoplasm of unspecified part of unspecified bronchus or lung: Secondary | ICD-10-CM | POA: Insufficient documentation

## 2021-09-14 LAB — CMP (CANCER CENTER ONLY)
ALT: 43 U/L (ref 0–44)
AST: 57 U/L — ABNORMAL HIGH (ref 15–41)
Albumin: 3.5 g/dL (ref 3.5–5.0)
Alkaline Phosphatase: 119 U/L (ref 38–126)
Anion gap: 8 (ref 5–15)
BUN: 7 mg/dL — ABNORMAL LOW (ref 8–23)
CO2: 28 mmol/L (ref 22–32)
Calcium: 9.5 mg/dL (ref 8.9–10.3)
Chloride: 101 mmol/L (ref 98–111)
Creatinine: 0.75 mg/dL (ref 0.44–1.00)
GFR, Estimated: >60 mL/min (ref >60)
Glucose, Bld: 103 mg/dL — ABNORMAL HIGH (ref 70–99)
Potassium: 3.8 mmol/L (ref 3.5–5.1)
Sodium: 137 mmol/L (ref 135–145)
Total Bilirubin: 1 mg/dL (ref 0.3–1.2)
Total Protein: 8.4 g/dL — ABNORMAL HIGH (ref 6.5–8.1)

## 2021-09-14 LAB — CBC WITH DIFFERENTIAL (CANCER CENTER ONLY)
Abs Immature Granulocytes: 0.01 10*3/uL (ref 0.00–0.07)
Basophils Absolute: 0.1 10*3/uL (ref 0.0–0.1)
Basophils Relative: 1 %
Eosinophils Absolute: 0.4 10*3/uL (ref 0.0–0.5)
Eosinophils Relative: 5 %
HCT: 43.7 % (ref 36.0–46.0)
Hemoglobin: 14.1 g/dL (ref 12.0–15.0)
Immature Granulocytes: 0 %
Lymphocytes Relative: 41 %
Lymphs Abs: 3.4 10*3/uL (ref 0.7–4.0)
MCH: 28.1 pg (ref 26.0–34.0)
MCHC: 32.3 g/dL (ref 30.0–36.0)
MCV: 87.1 fL (ref 80.0–100.0)
Monocytes Absolute: 1 10*3/uL (ref 0.1–1.0)
Monocytes Relative: 13 %
Neutro Abs: 3.3 10*3/uL (ref 1.7–7.7)
Neutrophils Relative %: 40 %
Platelet Count: 272 10*3/uL (ref 150–400)
RBC: 5.02 MIL/uL (ref 3.87–5.11)
RDW: 14.4 % (ref 11.5–15.5)
WBC Count: 8.2 10*3/uL (ref 4.0–10.5)
nRBC: 0 % (ref 0.0–0.2)

## 2021-09-14 NOTE — Progress Notes (Signed)
Reviewed: I agree with Dr. Koval's documentation and management. 

## 2021-09-14 NOTE — Telephone Encounter (Signed)
-----   Message from Leeanne Rio, MD sent at 09/07/2021 11:25 AM EST ----- Hi team,  Since [place] is out I ordered a f/u thyroid ultrasound for this patient. Can you schedule it for some time after December 3 and contact patient with an appointment?  Thanks! Leeanne Rio, MD

## 2021-09-14 NOTE — Telephone Encounter (Signed)
Called and scheduled Thyroid US: [facility] Tuesday December 6th arrive at 12:15. Pt informed and is ok with the date and time.Sonya Walton, [place]

## 2021-10-06 ENCOUNTER — Ambulatory Visit (HOSPITAL_COMMUNITY): Payer: 59

## 2021-10-08 ENCOUNTER — Ambulatory Visit (HOSPITAL_COMMUNITY)
Admission: RE | Admit: 2021-10-08 | Discharge: 2021-10-08 | Disposition: A | Discharge status: Home / Self Care | Payer: 59 | Source: Ambulatory Visit | Attending: Family Medicine | Admitting: Family Medicine

## 2021-10-08 ENCOUNTER — Telehealth: Payer: Self-pay | Admitting: Family Medicine

## 2021-10-08 ENCOUNTER — Other Ambulatory Visit: Payer: Self-pay

## 2021-10-08 ENCOUNTER — Other Ambulatory Visit: Payer: Self-pay | Admitting: Family Medicine

## 2021-10-08 DIAGNOSIS — E041 Nontoxic single thyroid nodule: Secondary | ICD-10-CM | POA: Insufficient documentation

## 2021-10-08 DIAGNOSIS — E042 Nontoxic multinodular goiter: Secondary | ICD-10-CM | POA: Diagnosis not present

## 2021-10-08 NOTE — Telephone Encounter (Signed)
Attempted to call patient with results.  Called patient to discuss thyroid results.  This demonstrates a slightly enlarged thyroid consistent with a goiter and nodule requiring follow-up in 1 year.  Does not meet criteria for biopsy at this time.  Recommend TSH at follow-up for goiter.

## 2021-10-21 ENCOUNTER — Other Ambulatory Visit: Payer: Self-pay | Admitting: Thoracic Surgery (Cardiothoracic Vascular Surgery)

## 2021-10-21 DIAGNOSIS — C3491 Malignant neoplasm of unspecified part of right bronchus or lung: Secondary | ICD-10-CM

## 2021-11-03 ENCOUNTER — Other Ambulatory Visit: Payer: Self-pay

## 2021-11-03 ENCOUNTER — Ambulatory Visit (INDEPENDENT_AMBULATORY_CARE_PROVIDER_SITE_OTHER): Payer: Self-pay | Admitting: Thoracic Surgery (Cardiothoracic Vascular Surgery)

## 2021-11-03 ENCOUNTER — Ambulatory Visit
Admission: RE | Admit: 2021-11-03 | Discharge: 2021-11-03 | Disposition: A | Discharge status: Home / Self Care | Payer: 59 | Source: Ambulatory Visit | Attending: Thoracic Surgery (Cardiothoracic Vascular Surgery) | Admitting: Thoracic Surgery (Cardiothoracic Vascular Surgery)

## 2021-11-03 VITALS — BP 128/78 | HR 90 | Resp 20 | Ht 63.0 in | Wt 190.0 lb

## 2021-11-03 DIAGNOSIS — C3411 Malignant neoplasm of upper lobe, right bronchus or lung: Secondary | ICD-10-CM

## 2021-11-03 DIAGNOSIS — Z09 Encounter for follow-up examination after completed treatment for conditions other than malignant neoplasm: Secondary | ICD-10-CM

## 2021-11-03 DIAGNOSIS — C3491 Malignant neoplasm of unspecified part of right bronchus or lung: Secondary | ICD-10-CM

## 2021-11-03 DIAGNOSIS — Z9889 Other specified postprocedural states: Secondary | ICD-10-CM | POA: Diagnosis not present

## 2021-11-03 DIAGNOSIS — C3490 Malignant neoplasm of unspecified part of unspecified bronchus or lung: Secondary | ICD-10-CM | POA: Diagnosis not present

## 2021-11-03 NOTE — Progress Notes (Signed)
[address]       [place] [address]             [phone]      HPI: [name] returns for a scheduled postoperative follow-up visit after recent right upper lobectomy.  Sonya Walton is a 61 year old woman old woman with a history of tobacco abuse, hypertension, hyperlipidemia, type 2 diabetes, arthritis, and a thyroid nodule.  She was followed for a right upper lobe nodule that was first noted November 2021.  Navigational bronchoscopy was nondiagnostic.  Continued follow-up showed increased in size.  I did a robotic assisted right upper lobectomy on 08/14/2021.  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  She did well postoperatively and went home on day 4.  I saw her back in the office on 09/01/2021.  She was having a lot of paresthesias and we started her on Lyrica.  In the interim since her last visit she saw Dr. Julien Nordmann.  No adjuvant therapy was indicated.  Her pain is improved significantly.  She is no longer taking tramadol or Lyrica.  She uses Tylenol occasionally.  She does notice some numbness and occasional mild pain especially with coughing.  Past Medical History:  Diagnosis Date   Allergy    Arthritis    Coronary artery disease    mild by CT 02/05/21   Diabetes mellitus without complication ([place])    Hoarse voice quality 01/19/2016   Hypertension    states under control with med., has been on med. since 2011   Immature cataract    Osteoarthritis 12/2015   right AC joint   Partial tear of right rotator cuff 12/2015   Thyroid nodule 2021   Wears dentures    upper      Current Outpatient Medications  Medication Sig Dispense Refill   acetaminophen (TYLENOL) 500 MG tablet Take 500 mg by mouth every 6 (six) hours as needed for mild pain or moderate pain.     aspirin EC 81 MG tablet Take 1 tablet (81 mg total) by mouth daily. Swallow whole. 90 tablet 3   atenolol-chlorthalidone (TENORETIC) 50-25 MG tablet TAKE 1 TABLET BY MOUTH EVERY DAY  (Patient taking differently: Take 1 tablet by mouth in the morning.) 90 tablet 3   blood glucose meter kit and supplies KIT Inject 1 each into the skin as directed. Use meter once daily to measure blood glucose. 1 each 0   cetirizine (ZYRTEC) 10 MG tablet Take 10 mg by mouth daily as needed for allergies.     Cholecalciferol (VITAMIN D3) 2000 UNITS TABS Take 2,000 Units by mouth every morning.     Dulaglutide 3 MG/0.5ML SOPN Inject 3 mg into the skin once a week. 2 mL 11   empagliflozin (JARDIANCE) 10 MG TABS tablet Take 10 mg by mouth in the morning.     glucose blood ([employer] ADVANCED GLUCOSE TEST) test strip Please specify directions, refills and quantity_0 Once daily, [id number], 3 refills, 100 quantity Dr Lattie Haw MD 100 each 0   Insulin Glargine (BASAGLAR KWIKPEN) 100 UNIT/ML Inject 16 Units into the skin daily. (Patient taking differently: Inject 25 Units into the skin daily.)     Insulin Pen Needle (PEN NEEDLES) 32G X 6 MM MISC 1 each by Does not apply route daily. 50 each 6   nitroGLYCERIN (NITROSTAT) 0.4 MG SL tablet Place 1 tablet (0.4 mg total) under the tongue every 5 (five) minutes as needed for chest pain. [place]  tablet 12   potassium chloride SA (KLOR-CON) 20 MEQ tablet Take 2 tablets (40 mEq total) by mouth daily. 180 tablet 3   RELION PEN NEEDLES 32G X 4 MM MISC USE 1 STRIP TO CHECK GLUCOSE ONCE DAILY 50 each 0   rosuvastatin (CRESTOR) 10 MG tablet Take 1 tablet (10 mg total) by mouth daily. 90 tablet 3   fluconazole (DIFLUCAN) 150 MG tablet Take by mouth. (Patient not taking: Reported on 11/03/2021)     No current facility-administered medications for this visit.    Physical Exam BP 128/78    Pulse 90    Resp 20    Ht 5' 3" (1.6 m)    Wt 190 lb (86.2 kg)    SpO2 93% Comment: RA   BMI 33.66 kg/m  Well-appearing 61 year old woman in no acute distress Alert and oriented x3 with no focal deficits Lungs diminished at right base, otherwise clear Incisions well-healed Cardiac  regular rate and rhythm  Diagnostic Tests: CHEST - 2 VIEW   COMPARISON:  09/01/2021   FINDINGS: The heart size and mediastinal contours are within normal limits. Unchanged elevation of the right hemidiaphragm and volume loss of the right hemithorax status post upper lobectomy. The visualized skeletal structures are unremarkable.   IMPRESSION: Postoperative findings of right upper lobectomy. No acute abnormality of the lungs.     Electronically Signed   By: Delanna Ahmadi M.D.   On: 11/03/2021 10:35 I personally reviewed the chest x-ray images.  There are postoperative changes.  Impression: Sonya Walton is a 61 year old woman with a history of tobacco abuse, hypertension, hyperlipidemia, type 2 diabetes, arthritis, and a thyroid nodule.   She was first noted to have a lung nodule in 2021.  Over time that increased in size.  I did a robotic right upper lobectomy.  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  She saw Dr. Julien Nordmann.  There is no indication for adjuvant therapy.  She did have some paresthesias initially after surgery.  She was treated with Lyrica.  She has stopped that medication and her paresthesias are minimal currently.  She is not taking any narcotics.  Plan: Follow-up as scheduled with Dr. Julien Nordmann I will be happy to see her back anytime in the future if I can be of any further assistance with her care  [name], MD [facility] [phone]

## 2021-11-12 ENCOUNTER — Ambulatory Visit (INDEPENDENT_AMBULATORY_CARE_PROVIDER_SITE_OTHER): Payer: 59 | Admitting: Podiatry

## 2021-11-12 ENCOUNTER — Other Ambulatory Visit: Payer: Self-pay

## 2021-11-12 ENCOUNTER — Encounter: Payer: Self-pay | Admitting: Podiatry

## 2021-11-12 DIAGNOSIS — E119 Type 2 diabetes mellitus without complications: Secondary | ICD-10-CM | POA: Diagnosis not present

## 2021-11-12 DIAGNOSIS — M79675 Pain in left toe(s): Secondary | ICD-10-CM

## 2021-11-12 DIAGNOSIS — L84 Corns and callosities: Secondary | ICD-10-CM

## 2021-11-12 DIAGNOSIS — M79674 Pain in right toe(s): Secondary | ICD-10-CM

## 2021-11-12 DIAGNOSIS — E1165 Type 2 diabetes mellitus with hyperglycemia: Secondary | ICD-10-CM

## 2021-11-12 DIAGNOSIS — B351 Tinea unguium: Secondary | ICD-10-CM | POA: Diagnosis not present

## 2021-11-13 ENCOUNTER — Ambulatory Visit (INDEPENDENT_AMBULATORY_CARE_PROVIDER_SITE_OTHER): Payer: 59 | Admitting: Family Medicine

## 2021-11-13 ENCOUNTER — Other Ambulatory Visit: Payer: Self-pay

## 2021-11-13 ENCOUNTER — Encounter: Payer: Self-pay | Admitting: Family Medicine

## 2021-11-13 VITALS — BP 114/73 | HR 71 | Ht 63.0 in | Wt 192.2 lb

## 2021-11-13 DIAGNOSIS — E041 Nontoxic single thyroid nodule: Secondary | ICD-10-CM

## 2021-11-13 DIAGNOSIS — E049 Nontoxic goiter, unspecified: Secondary | ICD-10-CM | POA: Diagnosis not present

## 2021-11-13 DIAGNOSIS — E1165 Type 2 diabetes mellitus with hyperglycemia: Secondary | ICD-10-CM

## 2021-11-13 DIAGNOSIS — Z23 Encounter for immunization: Secondary | ICD-10-CM

## 2021-11-13 DIAGNOSIS — L723 Sebaceous cyst: Secondary | ICD-10-CM | POA: Diagnosis not present

## 2021-11-13 LAB — POCT GLYCOSYLATED HEMOGLOBIN (HGB A1C): HbA1c, POC (controlled diabetic range): 5.9 % (ref 0.0–7.0)

## 2021-11-13 MED ORDER — DOXYCYCLINE MONOHYDRATE 100 MG PO CAPS: 100.0000 mg | ORAL_CAPSULE | Freq: Two times a day (BID) | ORAL | 0 refills | Status: AC

## 2021-11-13 NOTE — Progress Notes (Signed)
SUBJECTIVE:   CHIEF COMPLAINT / HPI:   Sonya Walton is a 61 y.o. female presents for diabetes follow up  Diabetes Patient's current diabetic medications include jardiance 10mg , glargine 25 units . Has stopped trulicity due to being worried about cancer. Tolerating well without side effects.  Patient endorses compliance with these medications. CBG readings averaging in the  range 90-100s.   Lab Results  Component Value Date   HGBA1C 5.9 11/13/2021   HGBA1C 6.8 (H) 08/12/2021   HGBA1C 9.2 (A) 06/04/2021    Denies abdominal pain, blurred vision, polyuria, polydipsia, hypoglycemia. Patient states they understand that diet and exercise can help with her diabetes.  Appetite has improved now.    Last Microalbumin, LDL, Creatinine: Lab Results  Component Value Date   LDLCALC 47 02/26/2021   CREATININE 0.75 09/14/2021   Scalp swelling Pt reports hx of sebaceous cysts which have needed I&D. She has noticed a large swelling over right side of scalp over the last few months which has recently got bigger and tender. No drainage, fevers etc.   Thyroid US results Stable heterogeneous enlarged thyroid compatible with goiter. Pt aware of results. Discussed that she needs TSH today.  [place] Office Visit from 11/13/2021 in [place]  PHQ-9 Total Score 4       PERTINENT  PMH / PSH: T2DM, Lung cancer, obesity   OBJECTIVE:   BP 114/73    Pulse 71    Ht 5\' 3"  (1.6 m)    Wt 192 lb 3.2 oz (87.2 kg)    SpO2 96%    BMI 34.05 kg/m    General: Alert, no acute distress Cardio: well perfused Pulm: normal work of breathing Neuro: Cranial nerves grossly intact   2-3 cm scalp sebaceous cyst with central punctum      ASSESSMENT/PLAN:   Diabetes mellitus with hyperglycemia (HCC) A1c 5.9. Congratulated pt on improvement of A1c but explained such tight control of diabetes is not needed. CBGs 90-100s without sx of hypoglycemia. Pt stopped trulicity 1 month ago  due to concern of getting cancers as she already has lung ca and family hx of cancer. Recommended she continues jardiance 10mg  and glargine 25 units daily. She will continue to keep CBG diary at home over the next few weeks. If they are still 90-100 then she will call me and let me know.  Fasting CBG goal 140-180. We can then titrate down the glargine if she continues to have CBGs in this range.  Sebaceous cyst Incision and drainage of scalp sebaceous cyst, unlikely to be infectious Performing Physician: Lattie Haw Supervising Physician: Dr Andria Frames  PROCEDURE:  A timeout protocol was performed prior to initiating the procedure.   The area was prepared and draped in the usual, sterile manner. The site was anesthetized with 2 % lidocaine with epinephrine. A circular incision was made along the local skin lines was made and the white purulent material expressed. The cyst/abcess was explored thoroughly and sequestered pockets were opened.  Bleeding was minimal.  Packing: none  Follow up: The patient tolerated the procedure well without  complications.  Standard post-procedure care is explained and return  precautions are given. Pt given Doxycycline 100mg  BID for 7 days. Follow up if cyst reoccurs.            Thyroid nodule incidentally noted on imaging study TSH normal today. Follow up thyroid US in fall 2023.    Lattie Haw, MD PGY-3 [place]  [place]

## 2021-11-13 NOTE — Patient Instructions (Addendum)
Thank you for coming to see me today. It was a pleasure. Today we discussed your diabetes. Continue to remain off the trulicity. Check blood sugars over the next few weeks if still 90-100 call me and we can adjust the insulin.  We drained the scalp cyst today. It could reform again, keep an eye on it. I recommend keeping it nice and clean and place bandaid over it. Put antibiotic ointment over it. Take 7 days of antibiotics.   Please follow-up with me in 1 week if it reappears or you have fevers etc  If you have any questions or concerns, please do not hesitate to call the office at [phone].  Best wishes,   Dr Posey Pronto

## 2021-11-14 LAB — TSH: TSH: 1.5 u[IU]/mL (ref 0.450–4.500)

## 2021-11-15 DIAGNOSIS — L723 Sebaceous cyst: Secondary | ICD-10-CM | POA: Insufficient documentation

## 2021-11-15 NOTE — Assessment & Plan Note (Addendum)
A1c 5.9. Congratulated pt on improvement of A1c but explained such tight control of diabetes is not needed. CBGs 90-100s without sx of hypoglycemia. Pt stopped trulicity 1 month ago due to concern of getting cancers as she already has lung ca and family hx of cancer. Recommended she continues jardiance 10mg  and glargine 25 units daily. She will continue to keep CBG diary at home over the next few weeks. If they are still 90-100 then she will call me and let me know.  Fasting CBG goal 140-180. We can then titrate down the glargine if she continues to have CBGs in this range.

## 2021-11-15 NOTE — Assessment & Plan Note (Signed)
TSH normal today. Follow up thyroid US in fall 2023.

## 2021-11-15 NOTE — Assessment & Plan Note (Addendum)
Incision and drainage of scalp sebaceous cyst, unlikely to be infectious Performing Physician: Lattie Haw Supervising Physician: Dr Andria Frames  PROCEDURE:  A timeout protocol was performed prior to initiating the procedure.   The area was prepared and draped in the usual, sterile manner. The site was anesthetized with 2 % lidocaine with epinephrine. A circular incision was made along the local skin lines was made and the white purulent material expressed. The cyst/abcess was explored thoroughly and sequestered pockets were opened.  Bleeding was minimal.  Packing: none  Follow up: The patient tolerated the procedure well without  complications.  Standard post-procedure care is explained and return  precautions are given. Pt given Doxycycline 100mg  BID for 7 days. Follow up if cyst reoccurs.

## 2021-11-16 NOTE — Progress Notes (Signed)
ANNUAL DIABETIC FOOT EXAM  Subjective: Sonya Walton presents today for for annual diabetic foot examination.  Patient relates 2 year h/o diabetes.  Patient denies any h/o foot wounds.  Patient has occasional symptoms of foot tingling.  Patient's blood sugar was 115 mg/dl today. Patient did not check blood glucose this morning.  She has h/o bilateral bunion surgery.  Lattie Haw, MD is patient's PCP. Last visit was November, 2022.  Past Medical History:  Diagnosis Date   Allergy    Arthritis    Coronary artery disease    mild by CT 02/05/21   Diabetes mellitus without complication ([place])    Hoarse voice quality 01/19/2016   Hypertension    states under control with med., has been on med. since 2011   Immature cataract    Osteoarthritis 12/2015   right AC joint   Partial tear of right rotator cuff 12/2015   Thyroid nodule 2021   Wears dentures    upper   Patient Active Problem List   Diagnosis Date Noted   Sebaceous cyst 11/15/2021   Lung cancer ([place]) 09/14/2021   S/P Robotic Assisted Right VATS with Right Lower Lobectomy, Lymph Node Dissection, Intercostal nerve block 08/14/2021   Pneumothorax after biopsy 03/09/2021   Pulmonary nodule 1 cm or greater in diameter 12/22/2020   Mass of right lung 12/12/2020   Thyroid nodule incidentally noted on imaging study 10/07/2020   Morbid obesity ([place]) 02/22/2020   Screening for malignant neoplasm of colon 02/22/2020   Encounter for screening mammogram for malignant neoplasm of breast 02/22/2020   Left breast mass 07/07/2018   Diabetes mellitus with hyperglycemia ([place]) 04/12/2016   Rotator cuff arthropathy 12/30/2015   Partial tear of right rotator cuff 05/02/2015   Hypertension 10/05/2012   History of tobacco use 12/31/2011   Arthritis 12/31/2011   Past Surgical History:  Procedure Laterality Date   BREAST BIOPSY Left    BRONCHIAL BIOPSY  03/09/2021   Procedure: BRONCHIAL BIOPSIES;  Surgeon: Collene Gobble, MD;   Location: [facility] ENDOSCOPY;  Service: Pulmonary;;   BRONCHIAL BRUSHINGS  03/09/2021   Procedure: BRONCHIAL BRUSHINGS;  Surgeon: Collene Gobble, MD;  Location[facility] ENDOSCOPY;  Service: Pulmonary;;   BRONCHIAL NEEDLE ASPIRATION BIOPSY  03/09/2021   Procedure: BRONCHIAL NEEDLE ASPIRATION BIOPSIES;  Surgeon: Collene Gobble, MD;  Location: [facility] ENDOSCOPY;  Service: Pulmonary;;   BUNIONECTOMY Bilateral 1994   CHOLECYSTECTOMY  01/17/2012   Procedure: LAPAROSCOPIC CHOLECYSTECTOMY;  Surgeon: Harl Bowie, MD;  Location: [facility];  Service: General;  Laterality: N/A;   FIDUCIAL MARKER PLACEMENT  03/09/2021   Procedure: FIDUCIAL MARKER PLACEMENT;  Surgeon: Collene Gobble, MD;  Location: [facility] ENDOSCOPY;  Service: Pulmonary;;   INTERCOSTAL NERVE BLOCK Right 08/14/2021   Procedure: INTERCOSTAL NERVE BLOCK;  Surgeon: [name], MD;  Location: [facility] OR;  Service: Thoracic;  Laterality: Right;   NODE DISSECTION Right 08/14/2021   Procedure: NODE DISSECTION;  Surgeon: [name], MD;  Location: [facility] OR;  Service: Thoracic;  Laterality: Right;   PUBOVAGINAL SLING  03/05/2003   SHOULDER ARTHROSCOPY WITH DISTAL CLAVICLE RESECTION Right 01/26/2016   Procedure: SHOULDER ARTHROSCOPY WITH DISTAL CLAVICLE RESECTION;  Surgeon: Tania Ade, MD;  Location: [facility];  Service: Orthopedics;  Laterality: Right;   SHOULDER ARTHROSCOPY WITH SUBACROMIAL DECOMPRESSION Right 01/26/2016   Procedure: SHOULDER ARTHROSCOPY WITH SUBACROMIAL DECOMPRESSION DEBRIDEMENT;  Surgeon: Tania Ade, MD;  Location: [place] [place];  Service: Orthopedics;  Laterality: Right;   TONSILLECTOMY  1975  TOTAL VAGINAL HYSTERECTOMY  03/05/2003   TUBAL LIGATION  1981   VAGINAL HYSTERECTOMY  2004   partial    VIDEO BRONCHOSCOPY WITH ENDOBRONCHIAL NAVIGATION N/A 03/09/2021   Procedure: VIDEO BRONCHOSCOPY WITH ENDOBRONCHIAL NAVIGATION;  Surgeon: Collene Gobble, MD;  Location: [facility] ENDOSCOPY;  Service: Pulmonary;   Laterality: N/A;   Current Outpatient Medications on File Prior to Visit  Medication Sig Dispense Refill   acetaminophen (TYLENOL) 500 MG tablet Take 500 mg by mouth every 6 (six) hours as needed for mild pain or moderate pain.     aspirin EC 81 MG tablet Take 1 tablet (81 mg total) by mouth daily. Swallow whole. 90 tablet 3   atenolol-chlorthalidone (TENORETIC) 50-25 MG tablet TAKE 1 TABLET BY MOUTH EVERY DAY (Patient taking differently: Take 1 tablet by mouth in the morning.) 90 tablet 3   blood glucose meter kit and supplies KIT Inject 1 each into the skin as directed. Use meter once daily to measure blood glucose. 1 each 0   cetirizine (ZYRTEC) 10 MG tablet Take 10 mg by mouth daily as needed for allergies.     Cholecalciferol (VITAMIN D3) 2000 UNITS TABS Take 2,000 Units by mouth every morning.     Dulaglutide 3 MG/0.5ML SOPN Inject 3 mg into the skin once a week. 2 mL 11   empagliflozin (JARDIANCE) 10 MG TABS tablet Take 10 mg by mouth in the morning.     fluconazole (DIFLUCAN) 150 MG tablet Take by mouth. (Patient not taking: Reported on 11/03/2021)     glucose blood ([employer] ADVANCED GLUCOSE TEST) test strip Please specify directions, refills and quantity\ Once daily, [id number], 3 refills, 100 quantity Dr Lattie Haw MD 100 each 0   Insulin Glargine (BASAGLAR KWIKPEN) 100 UNIT/ML Inject 16 Units into the skin daily. (Patient taking differently: Inject 25 Units into the skin daily.)     Insulin Pen Needle (PEN NEEDLES) 32G X 6 MM MISC 1 each by Does not apply route daily. 50 each 6   nitroGLYCERIN (NITROSTAT) 0.4 MG SL tablet Place 1 tablet (0.4 mg total) under the tongue every 5 (five) minutes as needed for chest pain. 30 tablet 12   potassium chloride SA (KLOR-CON) 20 MEQ tablet Take 2 tablets (40 mEq total) by mouth daily. 180 tablet 3   RELION PEN NEEDLES 32G X 4 MM MISC USE 1 STRIP TO CHECK GLUCOSE ONCE DAILY 50 each 0   rosuvastatin (CRESTOR) 10 MG tablet Take 1 tablet (10 mg total)  by mouth daily. 90 tablet 3   [DISCONTINUED] pantoprazole (PROTONIX) 40 MG tablet Take 1 tablet (40 mg total) by mouth daily at 12 noon. 30 tablet 0   No current facility-administered medications on file prior to visit.    Allergies  Allergen Reactions   Percocet [Oxycodone-Acetaminophen] Other (See Comments)    Dysphoria   Social History   Occupational History   Not on file  Tobacco Use   Smoking status: Former    Packs/day: 1.00    Years: 37.00    Pack years: 37.00    Types: Cigarettes    Start date: 11/01/1981    Quit date: 01/09/2018    Years since quitting: 3.8   Smokeless tobacco: Never   Tobacco comments:    1 ppd max use.   Vaping Use   Vaping Use: Never used  Substance and Sexual Activity   Alcohol use: Yes    Alcohol/week: 0.0 standard drinks    Comment: rarely   Drug use: No  Sexual activity: Not Currently    Partners: Male    Birth control/protection: Surgical   Family History  Problem Relation Age of Onset   Liver cancer Father    Cancer Father        lung and liver   Heart disease Mother    Hyperlipidemia Mother    Hypertension Mother    Heart failure Mother    Hypertension Other    Diabetes Other    Asthma Other    Diabetes type II Sister    Breast cancer Sister    Asthma Sister    COPD Sister    Diabetes type II Brother    Cancer Paternal Aunt        lung   Hypertension Sister    Hypertension Brother    Diabetes Brother    Asthma Brother    Cancer Other        lung   Colon polyps Neg Hx    Esophageal cancer Neg Hx    Rectal cancer Neg Hx    Stomach cancer Neg Hx    Immunization History  Administered Date(s) Administered   Hepatitis A, Adult 07/29/2021   Hepb-cpg 07/29/2021, 09/01/2021   Influenza,inj,Quad PF,6+ Mos 09/01/2020, 08/21/2021   Influenza-Unspecified 08/06/2014, 08/03/2016, 08/06/2017   PFIZER(Purple Top)SARS-COV-2 Vaccination 04/09/2020, 05/02/2020   PNEUMOCOCCAL CONJUGATE-20 11/13/2021   Pneumococcal  Polysaccharide-23 10/06/2020   Tdap 02/23/2013     Review of Systems: Negative except as noted in the HPI.   Objective: There were no vitals filed for this visit.  DELOISE MARCHANT is a pleasant 61 y.o. female in NAD. AAO X 3.  Vascular Examination: CFT <3 seconds b/l LE. Palpable DP/PT pulses b/l LE. Digital hair sparse b/l. Skin temperature gradient WNL b/l. No pain with calf compression b/l. No edema noted b/l. No cyanosis or clubbing noted b/l LE.  Dermatological Examination: Pedal integument with normal turgor, texture and tone b/l LE. No open wounds b/l. No interdigital macerations b/l. Toenails 1-5 b/l elongated, thickened, discolored with subungual debris. +Tenderness with dorsal palpation of nailplates. Hyperkeratotic lesion(s) noted plantar heel pad of left foot and submet head 5 right foot.  Musculoskeletal Examination: Normal muscle strength 5/5 to all lower extremity muscle groups bilaterally. No pain, crepitus or joint limitation noted with ROM b/l LE. No gross bony pedal deformities b/l. Patient ambulates independently without assistive aids.  Footwear Assessment: Does the patient wear appropriate shoes? Yes. Does the patient need inserts/orthotics? Yes.  Neurological Examination: Protective sensation intact 5/5 intact bilaterally with 10g monofilament b/l. Vibratory sensation decreased b/l. Proprioception intact bilaterally.  Hemoglobin A1C Latest Ref Rng & Units 11/13/2021 08/12/2021 06/04/2021 12/12/2020  HGBA1C 0.0 - 7.0 % 5.9 6.8(H) 9.2(A) 8.0(A)  Some recent data might be hidden   Assessment: 1. Pain due to onychomycosis of toenails of both feet   2. Callus   3. Type 2 diabetes mellitus with hyperglycemia, without long-term current use of insulin ([place])   4. Encounter for diabetic foot exam ([place])      ADA Risk Categorization: Low Risk :  Patient has all of the following: Intact protective sensation No prior foot ulcer  No severe deformity Pedal pulses  present  Plan: -Diabetic foot examination performed today. -Continue foot and shoe inspections daily. Monitor blood glucose per PCP/Endocrinologist's recommendations. -Mycotic toenails 1-5 bilaterally were debrided in length and girth with sterile nail nippers and dremel without incident. -Callus(es) plantar heel pad of left foot and submet head 5 right foot pared utilizing sterile scalpel  blade without complication or incident. Total number debrided =2. -Patient/POA to call should there be question/concern in the interim.  Return in about 3 months (around 02/10/2022).  Marzetta Board, DPM

## 2021-12-09 ENCOUNTER — Telehealth: Payer: Self-pay

## 2021-12-09 DIAGNOSIS — E1165 Type 2 diabetes mellitus with hyperglycemia: Secondary | ICD-10-CM

## 2021-12-09 NOTE — Telephone Encounter (Signed)
Patients calls nurse line reporting fasting CBGs in the 90s the last "week or so." Patient reports she was told to contact PCP if numbers dropped below 100 for medication adjustments. Patients reports ~90s have only been occurring in the mornings.  Patient denies any symptoms of hypoglycemia.   Will forward to PCP for review.

## 2021-12-11 ENCOUNTER — Telehealth: Payer: Self-pay | Admitting: Family Medicine

## 2021-12-11 NOTE — Telephone Encounter (Signed)
Called Sonya Walton and made some changes to diabetic medication. See telephone note. Thank you.

## 2021-12-11 NOTE — Telephone Encounter (Signed)
Called pt regarding recent low CBGs 90s. Most of her fasting CBGs 90-100s. She denies hypoglycemic events. She is still taking jardiance 10mg  and glargine 25 units. Recommended she reduced glargine to 22 units and I would like her fasting Cbgs 120-140s range. She will call me if they are still low despite change of glargine dose and we can titrate accordingly.  Discussed last year's thyroid US results. Sonya Walton would like to follow up in clinic next month-booked app with me on 8th March.

## 2021-12-14 ENCOUNTER — Other Ambulatory Visit: Payer: Self-pay | Admitting: Family Medicine

## 2021-12-17 ENCOUNTER — Other Ambulatory Visit: Payer: Self-pay | Admitting: Family Medicine

## 2021-12-17 DIAGNOSIS — I10 Essential (primary) hypertension: Secondary | ICD-10-CM

## 2022-01-05 NOTE — Progress Notes (Signed)
? ? ? ?  SUBJECTIVE:  ? ?CHIEF COMPLAINT / HPI:  ? ?Sonya Walton is a 61 y.o. female presents for diabetes follow up ? ? ?Diabetes ?Patient's current diabetic medications include jardiance 10mg  and lantus 22 units. Tolerating well without side effects.  Patient endorses compliance with these medications. CBG readings averaging in the 99-112 range.  Has skipped lantus a few days a week when CBGs<100. Patient's last A1c was  ?Lab Results  ?Component Value Date  ? HGBA1C 5.9 11/13/2021  ? HGBA1C 6.8 (H) 08/12/2021  ? HGBA1C 9.2 (A) 06/04/2021  ?Denies abdominal pain, blurred vision, polyuria, polydipsia, hypoglycemia. Patient states they understand that diet and exercise can help with her diabetes. ? ?Last Microalbumin, LDL, Creatinine: ?Lab Results  ?Component Value Date  ? [place] 47 02/26/2021  ? CREATININE 0.75 09/14/2021  ?  ?Concerned about weight gain ?Gained 3 lb since previous visit. Noticed appetite change since stopping trulicity. Pt eats cereal, eggs/toast, sandwich/burger and meat,rice/pasta and veggies. Working on small portions. Cannot afford to go to [place]. Goal weight is approx 150lb.  ? ?[place] Office Visit from 01/06/2022 in [place]  ?PHQ-9 Total Score 4  ? ?  ?   ? ?PERTINENT  PMH / PSH: DM, obesity ? ?OBJECTIVE:  ? ?BP 130/74   Pulse 72   Wt 195 lb 9.6 oz (88.7 kg)   SpO2 95%   BMI 34.65 kg/m?   ? ?General: Alert, no acute distress ?HEENT: small 1.5cm sebaceous cyst on right side of scalp, mildly TTP, no drainage or erythema ?Cardio: well perfused  ?Pulm: normal work of breathing ?Neuro: Cranial nerves grossly intact  ? ?ASSESSMENT/PLAN:  ? ?Weight loss counseling, encounter for ?Pt is noticing some weight gain post her surgery and after stopping trulicity. She would like to lose weight. Goal weight is 150lb, she is currently 195lb. She cannot afford HWW with her insurance. Right now she is working on portion control. Recommended: ?-Increasing fluid  intake ?-Increasing protein intake at every meal approx 30g each meal ?-Increase vegetables at each meal ?-Low glycemic index carbs ?-Avoiding processed foods ?-Will consider re-starting GLP 1 agonist next month if pt wants to after next a1c. ? ?Diabetes mellitus with hyperglycemia ([place]) ?CBGs improved but still lower than should be. They are all <140. Sometimes has hypoglycemic events. Recommends pt reduces lantus to 19 units but should take this daily. If her CBGs continue to be <140 she should contact me and we can reduce it further. A1c next month.  ? ?Sebaceous cyst ?Slightly increased in size since drainage in January. No obvious drainage or erythema. Pt denies fevers. Is not bothering pt too much right now. Will keep an eye on it. If she gets bigger or there are signs of infection pt should return to have it drained plus antibiotics. ?  ? ?Lattie Haw, MD PGY-3 ?[facility]  ?

## 2022-01-06 ENCOUNTER — Encounter: Payer: Self-pay | Admitting: Family Medicine

## 2022-01-06 ENCOUNTER — Other Ambulatory Visit: Payer: Self-pay

## 2022-01-06 ENCOUNTER — Ambulatory Visit (INDEPENDENT_AMBULATORY_CARE_PROVIDER_SITE_OTHER): Payer: 59 | Admitting: Family Medicine

## 2022-01-06 DIAGNOSIS — Z713 Dietary counseling and surveillance: Secondary | ICD-10-CM | POA: Diagnosis not present

## 2022-01-06 DIAGNOSIS — L723 Sebaceous cyst: Secondary | ICD-10-CM | POA: Diagnosis not present

## 2022-01-06 DIAGNOSIS — E1165 Type 2 diabetes mellitus with hyperglycemia: Secondary | ICD-10-CM

## 2022-01-06 NOTE — Patient Instructions (Signed)
Thank you for coming to see me today. It was a pleasure. Today we discussed your sugars, they are still a little low, reduce lantus to 19 units. If they still are <140 then call me. ? ?Try to aim for 20-30g protein per meal. Increase veggies every meal ?Avoid processed food ?Stay hydrated with plenty of water ? ?Please follow-up with me in 4 weeks  ? ?If you have any questions or concerns, please do not hesitate to call the office at [phone]. ? ?Best wishes,  ? ?Dr Posey Pronto   ?

## 2022-01-07 DIAGNOSIS — Z713 Dietary counseling and surveillance: Secondary | ICD-10-CM | POA: Insufficient documentation

## 2022-01-07 NOTE — Assessment & Plan Note (Signed)
Slightly increased in size since drainage in January. No obvious drainage or erythema. Pt denies fevers. Is not bothering pt too much right now. Will keep an eye on it. If she gets bigger or there are signs of infection pt should return to have it drained plus antibiotics. ?

## 2022-01-07 NOTE — Assessment & Plan Note (Signed)
CBGs improved but still lower than should be. They are all <140. Sometimes has hypoglycemic events. Recommends pt reduces lantus to 19 units but should take this daily. If her CBGs continue to be <140 she should contact me and we can reduce it further. A1c next month.  ?

## 2022-01-07 NOTE — Assessment & Plan Note (Signed)
Pt is noticing some weight gain post her surgery and after stopping trulicity. She would like to lose weight. Goal weight is 150lb, she is currently 195lb. She cannot afford HWW with her insurance. Right now she is working on portion control. Recommended: ?-Increasing fluid intake ?-Increasing protein intake at every meal approx 30g each meal ?-Increase vegetables at each meal ?-Low glycemic index carbs ?-Avoiding processed foods ?-Will consider re-starting GLP 1 agonist next month if pt wants to after next a1c. ?

## 2022-02-06 ENCOUNTER — Other Ambulatory Visit: Payer: Self-pay | Admitting: Family Medicine

## 2022-02-11 NOTE — Progress Notes (Addendum)
? ? ? ?  SUBJECTIVE:  ? ?CHIEF COMPLAINT / HPI:  ? ?Sonya Walton is a 61 y.o. female presents for diabetes follow up ? ?Diabetes ?Patient's current diabetic medications include jardiance, lantus 19 units. Tolerating well without side effects.  Patient endorses compliance with these medications. CBG readings averaging in the 103-112 range.  Patient's last A1c was  ?Lab Results  ?Component Value Date  ? HGBA1C 6.2 02/12/2022  ? HGBA1C 5.9 11/13/2021  ? HGBA1C 6.8 (H) 08/12/2021  ?`   Denies abdominal pain, blurred vision, polyuria, polydipsia, hypoglycemia. Patient states they understand that diet and exercise can help with her diabetes. ? ?ast Microalbumin, LDL, Creatinine: ?Lab Results  ?Component Value Date  ? [place] 47 02/26/2021  ? CREATININE 0.75 09/14/2021  ? ? ?Right groin issue ?Pt noticed a bump on the right pannus since last week. Might have got a little bigger.  It feels hard and painful. It is also erythematous. It has a small amount of discharge-white yellow. Took hot bath, warm wash cloth and took tylenol.  ? ?[facility] Office Visit from 02/12/2022 in [place]  ?PHQ-9 Total Score 3  ? ?  ?  ?PERTINENT  PMH / PSH: DM ? ?OBJECTIVE:  ? ?BP 119/68   Pulse 70   Temp 98.3 ?F (36.8 ?C) (Oral)   Resp 16   Wt 201 lb 6.4 oz (91.4 kg)   SpO2 96%   BMI 35.68 kg/m?   ? ?General: Alert, no acute distress ?Cardio: Normal S1 and S2, RRR, no r/m/g ?Pulm: CTAB, normal work of breathing ?Abdomen: Bowel sounds normal. Abdomen soft and non-tender.  ?Extremities: No peripheral edema.  ?Neuro: Cranial nerves grossly intact  ? ?Exam chaperoned by CMA Rodman Pickle  ? ? ? ? ?ASSESSMENT/PLAN:  ? ?Folliculitis ?Likely folliculitis from ingrown hair. No drainage. Approx 1cm, not large enough to drain. Recommended warm compresses, analgesia and continue to monitor sx. Strict ER precautions given to .pt ? ?Diabetes mellitus with hyperglycemia ([place]) ?A1c 6.2, Congratulated pt on good control. Continue  current diabetes management. ? ?Encounter for screening mammogram for malignant neoplasm of breast ?Ordered mammogram today. ?  ? ?Lattie Haw, MD PGY-3 ?[facility] -* ?

## 2022-02-12 ENCOUNTER — Encounter: Payer: Self-pay | Admitting: Family Medicine

## 2022-02-12 ENCOUNTER — Ambulatory Visit (INDEPENDENT_AMBULATORY_CARE_PROVIDER_SITE_OTHER): Payer: 59 | Admitting: Family Medicine

## 2022-02-12 VITALS — BP 119/68 | HR 70 | Temp 98.3°F | Resp 16 | Wt 201.4 lb

## 2022-02-12 DIAGNOSIS — L739 Follicular disorder, unspecified: Secondary | ICD-10-CM | POA: Diagnosis not present

## 2022-02-12 DIAGNOSIS — Z1231 Encounter for screening mammogram for malignant neoplasm of breast: Secondary | ICD-10-CM

## 2022-02-12 DIAGNOSIS — E1165 Type 2 diabetes mellitus with hyperglycemia: Secondary | ICD-10-CM | POA: Diagnosis not present

## 2022-02-12 DIAGNOSIS — N6459 Other signs and symptoms in breast: Secondary | ICD-10-CM

## 2022-02-12 DIAGNOSIS — E119 Type 2 diabetes mellitus without complications: Secondary | ICD-10-CM

## 2022-02-12 LAB — POCT GLYCOSYLATED HEMOGLOBIN (HGB A1C): HbA1c, POC (controlled diabetic range): 6.2 % (ref 0.0–7.0)

## 2022-02-12 MED ORDER — POTASSIUM CHLORIDE CRYS ER 20 MEQ PO TBCR: 40.0000 meq | EXTENDED_RELEASE_TABLET | Freq: Every day | ORAL | 3 refills | Status: DC

## 2022-02-12 MED ORDER — NITROGLYCERIN 0.4 MG SL SUBL: 0.4000 mg | SUBLINGUAL_TABLET | SUBLINGUAL | 12 refills | Status: DC | PRN

## 2022-02-12 MED ORDER — ROSUVASTATIN CALCIUM 10 MG PO TABS: 10.0000 mg | ORAL_TABLET | Freq: Every day | ORAL | 3 refills | Status: DC

## 2022-02-12 NOTE — Patient Instructions (Signed)
Thank you for coming to see me today. It was a pleasure. Today we discussed your right lower abdominal pain, it is likely a small infection of an ingrown hair, keep an eye on it. It does not need drainage right now. I recommend monitoring. ? ?Diabetes is great, well done. Continue same meds. F/u in 3 months. ? ?I have placed an order for your mammogram.  Please call [facility] at [phone] to schedule your appointment within one week.  ? ?Please follow-up with me as needed   ? ?If you have any questions or concerns, please do not hesitate to call the office at [phone]. ? ?Best wishes,  ? ?Dr Posey Pronto   ?

## 2022-02-12 NOTE — Progress Notes (Signed)
Patient is here for f/up DMll. ?Patient has concerns about having a possible bump in her groin area that is very painful. Patient would like the provider to look at ? ?

## 2022-02-16 ENCOUNTER — Other Ambulatory Visit: Payer: Self-pay | Admitting: Family Medicine

## 2022-02-16 DIAGNOSIS — N6459 Other signs and symptoms in breast: Secondary | ICD-10-CM

## 2022-02-18 DIAGNOSIS — L739 Follicular disorder, unspecified: Secondary | ICD-10-CM | POA: Insufficient documentation

## 2022-02-18 NOTE — Assessment & Plan Note (Addendum)
Likely folliculitis from ingrown hair. No drainage. Approx 1cm, not large enough to drain. Recommended warm compresses, analgesia and continue to monitor sx. Strict ER precautions given to .pt ?

## 2022-02-18 NOTE — Assessment & Plan Note (Signed)
A1c 6.2, Congratulated pt on good control. Continue current diabetes management. ?

## 2022-02-18 NOTE — Assessment & Plan Note (Signed)
Ordered mammogram today. ?

## 2022-02-24 ENCOUNTER — Other Ambulatory Visit: Payer: 59

## 2022-02-25 ENCOUNTER — Ambulatory Visit: Payer: 59 | Admitting: Podiatry

## 2022-02-26 ENCOUNTER — Ambulatory Visit (INDEPENDENT_AMBULATORY_CARE_PROVIDER_SITE_OTHER): Payer: 59 | Admitting: Family Medicine

## 2022-02-26 ENCOUNTER — Encounter: Payer: Self-pay | Admitting: Family Medicine

## 2022-02-26 DIAGNOSIS — S76311A Strain of muscle, fascia and tendon of the posterior muscle group at thigh level, right thigh, initial encounter: Secondary | ICD-10-CM

## 2022-02-26 DIAGNOSIS — S76319A Strain of muscle, fascia and tendon of the posterior muscle group at thigh level, unspecified thigh, initial encounter: Secondary | ICD-10-CM | POA: Insufficient documentation

## 2022-02-26 NOTE — Assessment & Plan Note (Signed)
Most likely right hamstring strain. Examination patient is tender on palpation of semimembranous muscle and tendon.  There is minor swelling in this area.  No erythema or warm to touch. The knee exam is normal. Low suspicion for collateral ligament/meniscal tear. I also considered DVT however symptoms are not entirely convincing of this.  Recommended the patient applies Ice to the affected area at least 4 times a day, she has a compression bandage at home which she will use and elevation.  Would avoid ibuprofen given age patient cantak Tylenol and also try to diclofenac which she has at home.  I recommended that she should message me on mychart if no improve in symptoms in the next few days. If she develops thigh/calf swelling, erythema then she should let me know and I will place order for DVT US. Strict ER precautions given to pt. ?

## 2022-02-26 NOTE — Patient Instructions (Addendum)
Thank you for coming to see me today. It was a pleasure. Today we discussed your pain. I think it is hamstring pain from a strain/tear. I recommend ICE multiple times a day, COMPRESSION, ELEVATION, TYLENOL.  ? ?Please message me next week if no improvement and we can send you to sports medicine. Let me know if there is any calf swelling or pain and this could be a sign of a clot in the legs which is dangerous ? ?If you have any questions or concerns, please do not hesitate to call the office at [phone]. ? ?Best wishes,  ? ?Dr Posey Pronto   ? ? ? ? ?0 ? ? ?Hamstring Strain ? ?A hamstring strain happens when the muscles in the back of the thighs (hamstring muscles) are overstretched or torn. The hamstring muscles are used in straightening the hips, bending the knees, and pulling back the legs. This injury is often called a pulled hamstring muscle. The tissue that connects the muscle to a bone (tendon) may also be affected. ?The severity of a hamstring strain may be rated in degrees or grades. First-degree (or grade 1) strains have the least amount of muscle tearing and pain. Second-degree and third-degree (grade 2 and 3) strains have increasingly more tearing and pain. ?What are the causes? ?This condition is caused by a sudden, violent force being placed on the hamstring muscles, stretching them too far. This often happens during activities that involve sprinting, jumping, kicking, or weight lifting. ?What increases the risk? ?Hamstring strains are especially common in athletes. The following factors may also make you more likely to develop this condition: ?Having low strength, endurance, or flexibility of the hamstring muscles. ?Doing high-impact physical activity or sports. ?Having poor physical fitness. ?Having a previous leg injury. ?Having tired (fatigued) muscles. ?Having a previous hamstring strain. ?What are the signs or symptoms? ?Symptoms of this condition include: ?Pain in the back of the thigh or  buttocks. ?Swelling. ?Bruising. ?Muscle spasms. ?Trouble moving the affected muscle because of pain. ?For severe strains, you may feel popping or snapping in the back of your thigh when the injury occurs. ?How is this diagnosed? ?This condition is diagnosed based on your symptoms, your medical history, and a physical exam. ?You may also have imaging tests, such as an MRI or X-rays. ?Your strain may be rated based on how severe it is. The ratings are: ?Grade 1 strain (mild). Muscles are overstretched. There may be very small muscle tears. ?Grade 2 strain (moderate). Muscles are partially torn. ?Grade 3 strain (severe). Muscles are completely torn. ?How is this treated? ?Treatment for this condition usually involves: ?Protecting, resting, icing, applying compression, and elevating the injured area (PRICE therapy). ?Medicines. Your health care provider may recommend medicines to help reduce pain or inflammation. ?Doing exercises to regain strength and flexibility in the muscles. Your health care provider will tell you when it is okay to begin exercising. ?Hamstring strains may take a long time to heal. This type of strain may happen again in athletes. Follow your health care provider's advice about when to return to sports-related activities. ?Follow these instructions at home: ?PRICE therapy ?Use PRICE therapy to promote muscle healing during the first 2-3 days after your injury, or as told by your health care provider. ?Protect the muscle from being injured again. ?Rest your injury. This usually involves limiting your normal activities and not using the injured hamstring muscle. Talk with your health care provider about how you should limit your activities. ?Apply ice to  the injured area: ?Put ice in a plastic bag. ?Place a towel between your skin and the bag. ?Leave the ice on for 20 minutes, 2-3 times a day. After the third day, switch to applying heat as told. ?Put pressure (compression) on your injured hamstring  by wrapping it with an elastic bandage. Be careful not to wrap it too tightly. That may interfere with blood circulation or may increase swelling. ?Raise (elevate) your injured hamstring above the level of your heart as often as possible. When you are lying down, you can do this by putting a pillow under your thigh. ? ?Activity ?Begin exercising or stretching only as told by your health care provider. ?Do not return to full activity level until your health care provider approves. ?To help prevent muscle strains in the future, always warm up before exercising and stretch afterward. ?General instructions ?Take over-the-counter and prescription medicines only as told by your health care provider. ?If directed, apply heat to the affected area as often as told by your health care provider. Use the heat source that your health care provider recommends, such as a moist heat pack or a heating pad. ?Place a towel between your skin and the heat source. ?Leave the heat on for 20-30 minutes. ?Remove the heat if your skin turns bright red. This is especially important if you are unable to feel pain, heat, or cold. You may have a greater risk of getting burned. ?Keep all follow-up visits. This is important. ?Contact a health care provider if: ?You have increasing pain or swelling in the injured area. ?You have numbness, tingling, or a significant loss of strength in the injured area. ?Get help right away if: ?Your foot or your toes become cold or turn blue. ?Summary ?A hamstring strain happens when the muscles in the back of the thighs (hamstring muscles) are overstretched or torn. ?This injury can be caused by a sudden, violent force being placed on the hamstring muscles, causing them to stretch too far. ?Symptoms include pain, swelling, and muscle spasms in the injured area. ?Treatment includes PRICE therapy: protecting, resting, icing, applying compression, and elevating the injured area. ?This information is not intended to  replace advice given to you by your health care provider. Make sure you discuss any questions you have with your health care provider. ?Document Revised: 03/19/2021 Document Reviewed: 03/19/2021 ?Elsevier Patient Education ? [place]. ? ?

## 2022-02-26 NOTE — Progress Notes (Signed)
     SUBJECTIVE:   CHIEF COMPLAINT / HPI:   Sonya Walton is a 61 y.o. female presents for thigh pain  Right thigh pain Right thigh pain started on 5 days. She noticed it when she woke up after sleeping.  The pain is described as an aching/sore type of pain and radiates towards the calf. Alleviated by having a hot bath. Exacerbated by worsening by walking and sitting for prolonged periods of time. Denies fevers, redness, or tenderness on touch.  Denies chest pain, palpitations, dizziness, cough, dyspnea. No recent surgeries, recent travel or recent immobilization. Denies recent falls or recent injuries.  Of note patient had a similar episode in 2006 on the same knee after she twisted it when she was at the [facility] from 02/26/2022 in [place]  PHQ-9 Total Score 1        PERTINENT  PMH / PSH: Arthritis, obesity, diabetes, hypertension, lung cancer  OBJECTIVE:   BP 120/69   Pulse 76   Ht 5\' 3"  (1.6 m)   Wt 201 lb 6 oz (91.3 kg)   SpO2 95%   BMI 35.67 kg/m    General: Alert, no acute distress Cardio: Normal S1 and S2, RRR, no r/m/g Pulm: CTAB, normal work of breathing Abdomen: Bowel sounds normal. Abdomen soft and non-tender.  Extremities: No peripheral edema.  Neuro: Cranial nerves grossly intact   Knee: - Inspection: no gross deformity. No swelling/effusion, erythema or bruising. Skin intact - Palpation: no TTP - ROM: full active ROM with flexion and extension in knee and hip - Strength: 5/5 strength - Neuro/vasc: NV intact - Special Tests: - LIGAMENTS: negative anterior and posterior drawer, negative Lachman's, no MCL or LCL laxity  -- MENISCUS: negative McMurray's, negative Thessaly  -- PF JOINT: nml patellar mobility bilaterally.  negative patellar grind, negative patellar apprehension  Hips: normal ROM, negative FABER and FADIR bilaterally   ASSESSMENT/PLAN:   Hamstring strain Most likely right hamstring  strain. Examination patient is tender on palpation of semimembranous muscle and tendon.  There is minor swelling in this area.  No erythema or warm to touch. The knee exam is normal. Low suspicion for collateral ligament/meniscal tear. I also considered DVT however symptoms are not entirely convincing of this.  Recommended the patient applies Ice to the affected area at least 4 times a day, she has a compression bandage at home which she will use and elevation.  Would avoid ibuprofen given age patient cantak Tylenol and also try to diclofenac which she has at home.  I recommended that she should message me on mychart if no improve in symptoms in the next few days. If she develops thigh/calf swelling, erythema then she should let me know and I will place order for DVT US. Strict ER precautions given to pt.    Lattie Haw, MD PGY-3 [place]

## 2022-03-03 ENCOUNTER — Telehealth: Payer: Self-pay

## 2022-03-03 DIAGNOSIS — S76309A Unspecified injury of muscle, fascia and tendon of the posterior muscle group at thigh level, unspecified thigh, initial encounter: Secondary | ICD-10-CM

## 2022-03-03 NOTE — Telephone Encounter (Signed)
Pt informed of below.Sonya Walton, CMA ? ?

## 2022-03-03 NOTE — Telephone Encounter (Signed)
Patient calls nurse line reporting continued discomfort in right leg.  ? ?Patient reports she has been icing and taking Tylenol, however no improvement.  ? ?Patient denies any worsening symptoms. Patient denies swelling to the area or erythema.  ? ?Patient reports provider was planning to send her to sports medicine.  ? ?Will forward to PCP.  ?

## 2022-03-03 NOTE — Telephone Encounter (Signed)
Referral to SM placed.  ? ?Nursing please let patient know they will call her. ?Dorris Singh, MD  ?Family Medicine Teaching Service  ? ?

## 2022-03-05 ENCOUNTER — Ambulatory Visit: Payer: Self-pay

## 2022-03-05 ENCOUNTER — Ambulatory Visit: Payer: 59 | Admitting: Family Medicine

## 2022-03-05 VITALS — BP 123/71 | Ht 63.0 in | Wt 201.0 lb

## 2022-03-05 DIAGNOSIS — M769 Unspecified enthesopathy, lower limb, excluding foot: Secondary | ICD-10-CM | POA: Diagnosis not present

## 2022-03-05 DIAGNOSIS — S76311A Strain of muscle, fascia and tendon of the posterior muscle group at thigh level, right thigh, initial encounter: Secondary | ICD-10-CM

## 2022-03-05 NOTE — Patient Instructions (Signed)
Erik - It was great to see you today, thank you for letting me participate in your care! ? ?Today, we discussed that you have a partial tear of one of the inside the muscles of your hamstring --> this is called the semimembranosus tendon/muscle.  ? ?Things you will do for this: ?-Pick up a neoprene knee sleeve and wear this during the day, remove this at nighttime ?-You are to ice the painful area for 15-20 minutes 3 times a day ?-You will be given some rehab exercises that Jinny Blossom shows you today, try to do these once to twice daily ?-You may take Aleve or ibuprofen as needed for the pain ? ?You will follow-up with [place] in 3-4 weeks if still having pain.  Usually partial tears can take anywhere from 3-6 weeks to get better. ? ?If you have any further questions, please give the clinic a call [phone]. ? ?Cheers, ? ?Elba Barman, DO ?[facility] ? ?

## 2022-03-05 NOTE — Progress Notes (Signed)
PCP: Lattie Haw, MD ? ?Subjective:  ? ?HPI: ?Sonya Walton is a very pleasant 61 y.o. female here for evaluation of right distal hamstring pain. ? ?Shacoria states that just over a week ago she was getting out of bed stretching when she felt a sharp pain of the medial posterior thigh.  She denied feeling any pop.  She had no bruising or erythema in this area.  She did feel like there is a small amount of swelling.  Of note she does state she had a similar episode after [place] the [place] about [place] ago.  She saw her primary care physician and thought that she had a hamstring strain but wanted her to see sports medicine for reassurance.  She denies any swelling or effusion about the knee.  The pain is worse when she is walking or she tries bending the leg.  She has no pain with calf raises.  She has done Tylenol and ice without much relief.  She has no history of DVT.  ? ?Past Medical History:  ?Diagnosis Date  ? Allergy   ? Arthritis   ? Coronary artery disease   ? mild by CT 02/05/21  ? Diabetes mellitus without complication ([place])   ? Hoarse voice quality 01/19/2016  ? Hypertension   ? states under control with med., has been on med. since 2011  ? Immature cataract   ? Osteoarthritis 12/2015  ? right AC joint  ? Partial tear of right rotator cuff 12/2015  ? Thyroid nodule 2021  ? Wears dentures   ? upper  ? ? ?Current Outpatient Medications on File Prior to Visit  ?Medication Sig Dispense Refill  ? acetaminophen (TYLENOL) 500 MG tablet Take 500 mg by mouth every 6 (six) hours as needed for mild pain or moderate pain.    ? aspirin EC 81 MG tablet Take 1 tablet (81 mg total) by mouth daily. Swallow whole. 90 tablet 3  ? atenolol-chlorthalidone (TENORETIC) 50-25 MG tablet Take 1 tablet by mouth once daily 90 tablet 0  ? blood glucose meter kit and supplies KIT Inject 1 each into the skin as directed. Use meter once daily to measure blood glucose. 1 each 0  ? cetirizine (ZYRTEC) 10 MG tablet Take 10 mg by  mouth daily as needed for allergies.    ? Cholecalciferol (VITAMIN D3) 2000 UNITS TABS Take 2,000 Units by mouth every morning.    ? Dulaglutide 3 MG/0.5ML SOPN Inject 3 mg into the skin once a week. 2 mL 11  ? empagliflozin (JARDIANCE) 10 MG TABS tablet Take 10 mg by mouth in the morning.    ? glucose blood ([facility] ADVANCED GLUCOSE TEST) test strip Please specify directions, refills and quantity\ ?Once daily, [id number], 3 refills, 100 quantity ?Dr Lattie Haw MD 100 each 0  ? Insulin Glargine (BASAGLAR KWIKPEN) 100 UNIT/ML Inject 16 Units into the skin daily. (Patient taking differently: Inject 25 Units into the skin daily.)    ? Insulin Pen Needle (PEN NEEDLES) 32G X 6 MM MISC 1 each by Does not apply route daily. 50 each 6  ? nitroGLYCERIN (NITROSTAT) 0.4 MG SL tablet Place 1 tablet (0.4 mg total) under the tongue every 5 (five) minutes as needed for chest pain. 30 tablet 12  ? potassium chloride SA (KLOR-CON M) 20 MEQ tablet Take 2 tablets (40 mEq total) by mouth daily. 180 tablet 3  ? RELION PEN NEEDLES 32G X 4 MM MISC USE TO CHECK GLUCOSE ONCE DAILY  50 each 0  ? rosuvastatin (CRESTOR) 10 MG tablet Take 1 tablet (10 mg total) by mouth daily. 90 tablet 3  ? [DISCONTINUED] pantoprazole (PROTONIX) 40 MG tablet Take 1 tablet (40 mg total) by mouth daily at 12 noon. 30 tablet 0  ? ?No current facility-administered medications on file prior to visit.  ? ? ?Past Surgical History:  ?Procedure Laterality Date  ? BREAST BIOPSY Left   ? BRONCHIAL BIOPSY  03/09/2021  ? Procedure: BRONCHIAL BIOPSIES;  Surgeon: Collene Gobble, MD;  Location: [facility] ENDOSCOPY;  Service: Pulmonary;;  ? BRONCHIAL BRUSHINGS  03/09/2021  ? Procedure: BRONCHIAL BRUSHINGS;  Surgeon: Collene Gobble, MD;  Location: [facility] ENDOSCOPY;  Service: Pulmonary;;  ? BRONCHIAL NEEDLE ASPIRATION BIOPSY  03/09/2021  ? Procedure: BRONCHIAL NEEDLE ASPIRATION BIOPSIES;  Surgeon: Collene Gobble, MD;  Location: [facility]COPY;  Service: Pulmonary;;  ? BUNIONECTOMY Bilateral  1994  ? CHOLECYSTECTOMY  01/17/2012  ? Procedure: LAPAROSCOPIC CHOLECYSTECTOMY;  Surgeon: Harl Bowie, MD;  Location: [place];  Service: General;  Laterality: N/A;  ? FIDUCIAL MARKER PLACEMENT  03/09/2021  ? Procedure: FIDUCIAL MARKER PLACEMENT;  Surgeon: Collene Gobble, MD;  Location: [facility] ENDOSCOPY;  Service: Pulmonary;;  ? INTERCOSTAL NERVE BLOCK Right 08/14/2021  ? Procedure: INTERCOSTAL NERVE BLOCK;  Surgeon: [name], MD;  Location: [place];  Service: Thoracic;  Laterality: Right;  ? NODE DISSECTION Right 08/14/2021  ? Procedure: NODE DISSECTION;  Surgeon: [name], MD;  Location: [place];  Service: Thoracic;  Laterality: Right;  ? PUBOVAGINAL SLING  03/05/2003  ? SHOULDER ARTHROSCOPY WITH DISTAL CLAVICLE RESECTION Right 01/26/2016  ? Procedure: SHOULDER ARTHROSCOPY WITH DISTAL CLAVICLE RESECTION;  Surgeon: Tania Ade, MD;  Location: [place];  Service: Orthopedics;  Laterality: Right;  ? SHOULDER ARTHROSCOPY WITH SUBACROMIAL DECOMPRESSION Right 01/26/2016  ? Procedure: SHOULDER ARTHROSCOPY WITH SUBACROMIAL DECOMPRESSION DEBRIDEMENT;  Surgeon: Tania Ade, MD;  Location: [employer];  Service: Orthopedics;  Laterality: Right;  ? TONSILLECTOMY  1975  ? TOTAL VAGINAL HYSTERECTOMY  03/05/2003  ? TUBAL LIGATION  1981  ? VAGINAL HYSTERECTOMY  2004  ? partial   ? VIDEO BRONCHOSCOPY WITH ENDOBRONCHIAL NAVIGATION N/A 03/09/2021  ? Procedure: VIDEO BRONCHOSCOPY WITH ENDOBRONCHIAL NAVIGATION;  Surgeon: Collene Gobble, MD;  Location: [facility] ENDOSCOPY;  Service: Pulmonary;  Laterality: N/A;  ? ? ?Allergies  ?Allergen Reactions  ? Percocet [Oxycodone-Acetaminophen] Other (See Comments)  ?  Dysphoria  ? ? ?BP 123/71   Ht $R'5\' 3"'fX$  (1.6 m)   Wt 201 lb (91.2 kg)   BMI 35.61 kg/m?  ? ?   ? View : No data to display.  ?  ?  ?  ? ? ?   ? View : No data to display.  ?  ?  ?  ? ? ?    ?Objective:  ?Physical Exam: ? ?Gen: Well-appearing, in no acute distress; non-toxic ?CV: Regular  Rate. Well-perfused. Warm.  ?Resp: Breathing unlabored on room air; no wheezing. ?Psych: Fluid speech in conversation; appropriate affect; normal thought process ?Neuro: Sensation intact throughout. No gross coordination deficits.  ?MSK:  ?- Right knee/hamstrings: Inspection of the knee does demonstrate some bony arthritic change although no erythema, ecchymosis or effusion.  There is some soft tissue swelling over the medial posterior aspect of the knee and distal hamstring. + TTP over the distal aspect of the semimembranosus/semitendinosis tendon origin.  Mild TTP over the posterior aspect of the medial femoral condyle.  No significant joint line tenderness.  Negative anterior/posterior drawer.  Negative McMurray's testing.  There is full range of motion in flexion and extension, although some pain with endrange flexion and resisted knee flexion.  No pain and full strength with calf raises bilaterally.  Neurovascular intact distally. ? ?MSK Limited Leg/Knee ultrasound performed, right ? ?-Medial knee joint, meniscus and MCL visualized with mild OA/spurring, otherwise no abnormality. ?-Popliteal fossa was visualized, no Baker's cyst present.  Veins appear compressible on dynamic testing. ?-Long axis view of the semimembranosus tendon does show evidence of a partial tear with hypoechoic fluid near the distal insertion just proximal to the medial femoral condyle.  There is mild-moderate hyperemia at the site.  Visualization in short axis does visualize hypoechoic fluid with a slightly thickened hyperechoic distal tendon of the semimembranosus.  No bony abnormality of the medial femoral condyle. ?-Long axis evaluation of the proximal calf demonstrates medial head of the gastrocnemius and soleus tendons without tearing or abnormality. ?  ?IMPRESSION: Partial tear with hypoechoic surrounding fluid near the distal insertion of the semimembranosus tendon. ? ?  ? ?  ?Assessment & Plan:  ?1. Semimembranosus partial tear,  right leg ?2. Right distal hamstring pain ? ?Discussed the likely etiology of her pain as she does have evidence of a partial tear of her distal hamstring tendon.  We did offer her a body helix neoprene sle

## 2022-03-07 NOTE — Progress Notes (Signed)
SMC: Attending Note: I have reviewed the chart, discussed wit the Sports Medicine Fellow. I agree with assessment and treatment plan as detailed in the Fellow's note.  

## 2022-03-11 ENCOUNTER — Inpatient Hospital Stay: Payer: 59 | Attending: Internal Medicine

## 2022-03-11 ENCOUNTER — Ambulatory Visit (HOSPITAL_COMMUNITY)
Admission: RE | Admit: 2022-03-11 | Discharge: 2022-03-11 | Disposition: A | Discharge status: Home / Self Care | Payer: 59 | Source: Ambulatory Visit | Attending: Physician Assistant | Admitting: Physician Assistant

## 2022-03-11 ENCOUNTER — Other Ambulatory Visit: Payer: Self-pay

## 2022-03-11 DIAGNOSIS — R0789 Other chest pain: Secondary | ICD-10-CM | POA: Insufficient documentation

## 2022-03-11 DIAGNOSIS — C3411 Malignant neoplasm of upper lobe, right bronchus or lung: Secondary | ICD-10-CM | POA: Diagnosis not present

## 2022-03-11 DIAGNOSIS — J9859 Other diseases of mediastinum, not elsewhere classified: Secondary | ICD-10-CM | POA: Diagnosis not present

## 2022-03-11 DIAGNOSIS — I10 Essential (primary) hypertension: Secondary | ICD-10-CM | POA: Insufficient documentation

## 2022-03-11 DIAGNOSIS — Z85118 Personal history of other malignant neoplasm of bronchus and lung: Secondary | ICD-10-CM | POA: Diagnosis not present

## 2022-03-11 DIAGNOSIS — I2510 Atherosclerotic heart disease of native coronary artery without angina pectoris: Secondary | ICD-10-CM | POA: Diagnosis not present

## 2022-03-11 DIAGNOSIS — J929 Pleural plaque without asbestos: Secondary | ICD-10-CM | POA: Diagnosis not present

## 2022-03-11 DIAGNOSIS — R0602 Shortness of breath: Secondary | ICD-10-CM | POA: Insufficient documentation

## 2022-03-11 LAB — CBC WITH DIFFERENTIAL (CANCER CENTER ONLY)
Abs Immature Granulocytes: 0.02 10*3/uL (ref 0.00–0.07)
Basophils Absolute: 0 10*3/uL (ref 0.0–0.1)
Basophils Relative: 0 %
Eosinophils Absolute: 0.4 10*3/uL (ref 0.0–0.5)
Eosinophils Relative: 4 %
HCT: 47.6 % — ABNORMAL HIGH (ref 36.0–46.0)
Hemoglobin: 15.9 g/dL — ABNORMAL HIGH (ref 12.0–15.0)
Immature Granulocytes: 0 %
Lymphocytes Relative: 38 %
Lymphs Abs: 3.4 10*3/uL (ref 0.7–4.0)
MCH: 28.3 pg (ref 26.0–34.0)
MCHC: 33.4 g/dL (ref 30.0–36.0)
MCV: 84.8 fL (ref 80.0–100.0)
Monocytes Absolute: 0.9 10*3/uL (ref 0.1–1.0)
Monocytes Relative: 10 %
Neutro Abs: 4.3 10*3/uL (ref 1.7–7.7)
Neutrophils Relative %: 48 %
Platelet Count: 233 10*3/uL (ref 150–400)
RBC: 5.61 MIL/uL — ABNORMAL HIGH (ref 3.87–5.11)
RDW: 15.5 % (ref 11.5–15.5)
WBC Count: 9 10*3/uL (ref 4.0–10.5)
nRBC: 0 % (ref 0.0–0.2)

## 2022-03-11 LAB — CMP (CANCER CENTER ONLY)
ALT: 33 U/L (ref 0–44)
AST: 33 U/L (ref 15–41)
Albumin: 4 g/dL (ref 3.5–5.0)
Alkaline Phosphatase: 91 U/L (ref 38–126)
Anion gap: 5 (ref 5–15)
BUN: 11 mg/dL (ref 8–23)
CO2: 34 mmol/L — ABNORMAL HIGH (ref 22–32)
Calcium: 9.4 mg/dL (ref 8.9–10.3)
Chloride: 102 mmol/L (ref 98–111)
Creatinine: 0.69 mg/dL (ref 0.44–1.00)
GFR, Estimated: >60 mL/min (ref >60)
Glucose, Bld: 101 mg/dL — ABNORMAL HIGH (ref 70–99)
Potassium: 3.6 mmol/L (ref 3.5–5.1)
Sodium: 141 mmol/L (ref 135–145)
Total Bilirubin: 0.7 mg/dL (ref 0.3–1.2)
Total Protein: 7.8 g/dL (ref 6.5–8.1)

## 2022-03-11 MED ORDER — IOHEXOL 300 MG/ML  SOLN
75.0000 mL | Freq: Once | INTRAMUSCULAR | Status: AC | PRN
  Administered 2022-03-11: 75 mL via INTRAVENOUS

## 2022-03-11 MED ORDER — SODIUM CHLORIDE (PF) 0.9 % IJ SOLN
INTRAMUSCULAR | Status: AC
  Filled 2022-03-11: qty 50

## 2022-03-15 ENCOUNTER — Inpatient Hospital Stay (HOSPITAL_BASED_OUTPATIENT_CLINIC_OR_DEPARTMENT_OTHER): Payer: 59 | Admitting: Internal Medicine

## 2022-03-15 ENCOUNTER — Encounter: Payer: Self-pay | Admitting: Internal Medicine

## 2022-03-15 ENCOUNTER — Other Ambulatory Visit: Payer: Self-pay

## 2022-03-15 VITALS — BP 120/73 | HR 66 | Temp 96.8°F | Resp 17 | Wt 203.2 lb

## 2022-03-15 DIAGNOSIS — I10 Essential (primary) hypertension: Secondary | ICD-10-CM | POA: Diagnosis not present

## 2022-03-15 DIAGNOSIS — C3411 Malignant neoplasm of upper lobe, right bronchus or lung: Secondary | ICD-10-CM | POA: Diagnosis not present

## 2022-03-15 DIAGNOSIS — R0602 Shortness of breath: Secondary | ICD-10-CM | POA: Diagnosis not present

## 2022-03-15 DIAGNOSIS — C3490 Malignant neoplasm of unspecified part of unspecified bronchus or lung: Secondary | ICD-10-CM | POA: Diagnosis not present

## 2022-03-15 DIAGNOSIS — R0789 Other chest pain: Secondary | ICD-10-CM | POA: Diagnosis not present

## 2022-03-15 NOTE — Progress Notes (Signed)
?    [place] ?[phone]   [phone] ? ?OFFICE PROGRESS NOTE ? ?Lattie Haw, MD ?[facility] [address] ?[place] [address] ? ?DIAGNOSIS:  stage Ia (T1b, N0, M0) non-small cell lung cancer, adenocarcinoma presented with right upper lobe lung nodule diagnosed in October 2022. ? ?PRIOR THERAPY: Status post  right upper lobectomy and lymph node dissection under the care of Dr. Roxan Hockey on 08/14/2021. ? ?CURRENT THERAPY: Observation. ? ?INTERVAL HISTORY: ?Sonya Walton 61 y.o. female returns to the clinic today for 48-month follow-up visit.  The patient is feeling fine today with no concerning complaints except for mild shortness of breath with exertion especially going uphill.  She denied having any chest pain, cough or hemoptysis.  She has no nausea, vomiting, diarrhea or constipation.  She has no headache or visual changes.  She has no recent weight loss or night sweats.  She has repeat CT scan of the chest performed recently and she is here for evaluation and discussion of her scan results. ? ?MEDICAL HISTORY: ?Past Medical History:  ?Diagnosis Date  ? Allergy   ? Arthritis   ? Coronary artery disease   ? mild by CT 02/05/21  ? Diabetes mellitus without complication ([place])   ? Hoarse voice quality 01/19/2016  ? Hypertension   ? states under control with med., has been on med. since 2011  ? Immature cataract   ? Osteoarthritis 12/2015  ? right AC joint  ? Partial tear of right rotator cuff 12/2015  ? Thyroid nodule 2021  ? Wears dentures   ? upper  ? ? ?ALLERGIES:  is allergic to percocet [oxycodone-acetaminophen]. ? ?MEDICATIONS:  ?Current Outpatient Medications  ?Medication Sig Dispense Refill  ? acetaminophen (TYLENOL) 500 MG tablet Take 500 mg by mouth every 6 (six) hours as needed for mild pain or moderate pain.    ? aspirin EC 81 MG tablet Take 1 tablet (81 mg total) by mouth daily. Swallow whole. 90 tablet 3  ? atenolol-chlorthalidone (TENORETIC) 50-25 MG tablet Take 1  tablet by mouth once daily 90 tablet 0  ? blood glucose meter kit and supplies KIT Inject 1 each into the skin as directed. Use meter once daily to measure blood glucose. 1 each 0  ? cetirizine (ZYRTEC) 10 MG tablet Take 10 mg by mouth daily as needed for allergies.    ? Cholecalciferol (VITAMIN D3) 2000 UNITS TABS Take 2,000 Units by mouth every morning.    ? Dulaglutide 3 MG/0.5ML SOPN Inject 3 mg into the skin once a week. 2 mL 11  ? empagliflozin (JARDIANCE) 10 MG TABS tablet Take 10 mg by mouth in the morning.    ? glucose blood ([facility] ADVANCED GLUCOSE TEST) test strip Please specify directions, refills and quantity\ ?Once daily, [id number], 3 refills, 100 quantity ?Dr Lattie Haw MD 100 each 0  ? Insulin Glargine (BASAGLAR KWIKPEN) 100 UNIT/ML Inject 16 Units into the skin daily. (Patient taking differently: Inject 25 Units into the skin daily.)    ? Insulin Pen Needle (PEN NEEDLES) 32G X 6 MM MISC 1 each by Does not apply route daily. 50 each 6  ? nitroGLYCERIN (NITROSTAT) 0.4 MG SL tablet Place 1 tablet (0.4 mg total) under the tongue every 5 (five) minutes as needed for chest pain. 30 tablet 12  ? potassium chloride SA (KLOR-CON M) 20 MEQ tablet Take 2 tablets (40 mEq total) by mouth daily. 180 tablet 3  ? RELION PEN NEEDLES 32G X 4 MM  MISC USE TO CHECK GLUCOSE ONCE DAILY 50 each 0  ? rosuvastatin (CRESTOR) 10 MG tablet Take 1 tablet (10 mg total) by mouth daily. 90 tablet 3  ? ?No current facility-administered medications for this visit.  ? ? ?SURGICAL HISTORY:  ?Past Surgical History:  ?Procedure Laterality Date  ? BREAST BIOPSY Left   ? BRONCHIAL BIOPSY  03/09/2021  ? Procedure: BRONCHIAL BIOPSIES;  Surgeon: Collene Gobble, MD;  Location: [facility] ENDOSCOPY;  Service: Pulmonary;;  ? BRONCHIAL BRUSHINGS  03/09/2021  ? Procedure: BRONCHIAL BRUSHINGS;  Surgeon: Collene Gobble, MD;  Location: [facility] ENDOSCOPY;  Service: Pulmonary;;  ? BRONCHIAL NEEDLE ASPIRATION BIOPSY  03/09/2021  ? Procedure: BRONCHIAL NEEDLE  ASPIRATION BIOPSIES;  Surgeon: Collene Gobble, MD;  Location: [facility] ENDOSCOPY;  Service: Pulmonary;;  ? BUNIONECTOMY Bilateral 1994  ? CHOLECYSTECTOMY  01/17/2012  ? Procedure: LAPAROSCOPIC CHOLECYSTECTOMY;  Surgeon: Harl Bowie, MD;  Location: [place];  Service: General;  Laterality: N/A;  ? FIDUCIAL MARKER PLACEMENT  03/09/2021  ? Procedure: FIDUCIAL MARKER PLACEMENT;  Surgeon: Collene Gobble, MD;  Location: [facility] ENDOSCOPY;  Service: Pulmonary;;  ? INTERCOSTAL NERVE BLOCK Right 08/14/2021  ? Procedure: INTERCOSTAL NERVE BLOCK;  Surgeon: [name], MD;  Location: [place];  Service: Thoracic;  Laterality: Right;  ? NODE DISSECTION Right 08/14/2021  ? Procedure: NODE DISSECTION;  Surgeon: [name], MD;  Location: [place];  Service: Thoracic;  Laterality: Right;  ? PUBOVAGINAL SLING  03/05/2003  ? SHOULDER ARTHROSCOPY WITH DISTAL CLAVICLE RESECTION Right 01/26/2016  ? Procedure: SHOULDER ARTHROSCOPY WITH DISTAL CLAVICLE RESECTION;  Surgeon: Tania Ade, MD;  Location: [place];  Service: Orthopedics;  Laterality: Right;  ? SHOULDER ARTHROSCOPY WITH SUBACROMIAL DECOMPRESSION Right 01/26/2016  ? Procedure: SHOULDER ARTHROSCOPY WITH SUBACROMIAL DECOMPRESSION DEBRIDEMENT;  Surgeon: Tania Ade, MD;  Location: [place];  Service: Orthopedics;  Laterality: Right;  ? TONSILLECTOMY  1975  ? TOTAL VAGINAL HYSTERECTOMY  03/05/2003  ? TUBAL LIGATION  1981  ? VAGINAL HYSTERECTOMY  2004  ? partial   ? VIDEO BRONCHOSCOPY WITH ENDOBRONCHIAL NAVIGATION N/A 03/09/2021  ? Procedure: VIDEO BRONCHOSCOPY WITH ENDOBRONCHIAL NAVIGATION;  Surgeon: Collene Gobble, MD;  Location: [facility] ENDOSCOPY;  Service: Pulmonary;  Laterality: N/A;  ? ? ?REVIEW OF SYSTEMS:  A comprehensive review of systems was negative except for: Respiratory: positive for dyspnea on exertion and pleurisy/chest pain  ? ?PHYSICAL EXAMINATION: General appearance: alert, cooperative, and no distress ?Head: Normocephalic,  without obvious abnormality, atraumatic ?Neck: no adenopathy, no JVD, supple, symmetrical, trachea midline, and thyroid not enlarged, symmetric, no tenderness/mass/nodules ?Lymph nodes: Cervical, supraclavicular, and axillary nodes normal. ?Resp: clear to auscultation bilaterally ?Back: symmetric, no curvature. ROM normal. No CVA tenderness. ?Cardio: regular rate and rhythm, S1, S2 normal, no murmur, click, rub or gallop ?GI: soft, non-tender; bowel sounds normal; no masses,  no organomegaly ?Extremities: extremities normal, atraumatic, no cyanosis or edema ? ?ECOG PERFORMANCE STATUS: 1 - Symptomatic but completely ambulatory ? ?Blood pressure 120/73, pulse 66, temperature (!) 96.8 ?F (36 ?C), temperature source Tympanic, resp. rate 17, weight 203 lb 4 oz (92.2 kg), SpO2 94 %. ? ?LABORATORY DATA: ?Lab Results  ?Component Value Date  ? WBC 9.0 03/11/2022  ? HGB 15.9 (H) 03/11/2022  ? HCT 47.6 (H) 03/11/2022  ? MCV 84.8 03/11/2022  ? PLT 233 03/11/2022  ? ? ?  Chemistry   ?   ?Component Value Date/Time  ? NA 141 03/11/2022 1202  ? NA 139 06/18/2021 1006  ?  K 3.6 03/11/2022 1202  ? CL 102 03/11/2022 1202  ? CO2 34 (H) 03/11/2022 1202  ? BUN 11 03/11/2022 1202  ? BUN 9 06/18/2021 1006  ? CREATININE 0.69 03/11/2022 1202  ? CREATININE 0.69 04/12/2016 0953  ? GLU 160 (H) 06/29/2021 1022  ?    ?Component Value Date/Time  ? CALCIUM 9.4 03/11/2022 1202  ? ALKPHOS 91 03/11/2022 1202  ? AST 33 03/11/2022 1202  ? ALT 33 03/11/2022 1202  ? BILITOT 0.7 03/11/2022 1202  ?  ? ? ? ?RADIOGRAPHIC STUDIES: ?CT Chest W Contrast ? ?Result Date: 03/15/2022 ?CLINICAL DATA:  61 year old female with history of non-small cell lung cancer. Follow-up study. EXAM: CT CHEST WITH CONTRAST TECHNIQUE: Multidetector CT imaging of the chest was performed during intravenous contrast administration. RADIATION DOSE REDUCTION: This exam was performed according to the departmental dose-optimization program which includes automated exposure control,  adjustment of the mA and/or kV according to patient size and/or use of iterative reconstruction technique. CONTRAST:  36mL OMNIPAQUE IOHEXOL 300 MG/ML  SOLN COMPARISON:  Chest CT 06/13/2021. FINDINGS: Cardiovascular: H

## 2022-03-15 NOTE — Addendum Note (Signed)
Addended by: Ardeen Garland on: 03/15/2022 02:49 PM ? ? Modules accepted: Orders ? ?

## 2022-03-17 ENCOUNTER — Ambulatory Visit
Admission: RE | Admit: 2022-03-17 | Discharge: 2022-03-17 | Disposition: A | Discharge status: Home / Self Care | Payer: 59 | Source: Ambulatory Visit | Attending: Family Medicine | Admitting: Family Medicine

## 2022-03-17 DIAGNOSIS — N6453 Retraction of nipple: Secondary | ICD-10-CM | POA: Diagnosis not present

## 2022-03-17 DIAGNOSIS — R922 Inconclusive mammogram: Secondary | ICD-10-CM | POA: Diagnosis not present

## 2022-03-17 DIAGNOSIS — N6459 Other signs and symptoms in breast: Secondary | ICD-10-CM

## 2022-03-23 ENCOUNTER — Other Ambulatory Visit: Payer: Self-pay | Admitting: Family Medicine

## 2022-03-23 DIAGNOSIS — I10 Essential (primary) hypertension: Secondary | ICD-10-CM

## 2022-04-04 ENCOUNTER — Other Ambulatory Visit: Payer: Self-pay | Admitting: Family Medicine

## 2022-04-06 ENCOUNTER — Encounter: Payer: Self-pay | Admitting: *Deleted

## 2022-04-29 ENCOUNTER — Other Ambulatory Visit: Payer: Self-pay | Admitting: Family Medicine

## 2022-05-06 ENCOUNTER — Ambulatory Visit (INDEPENDENT_AMBULATORY_CARE_PROVIDER_SITE_OTHER): Payer: 59 | Admitting: Podiatry

## 2022-05-06 ENCOUNTER — Encounter: Payer: Self-pay | Admitting: Podiatry

## 2022-05-06 DIAGNOSIS — M79675 Pain in left toe(s): Secondary | ICD-10-CM | POA: Diagnosis not present

## 2022-05-06 DIAGNOSIS — M79674 Pain in right toe(s): Secondary | ICD-10-CM | POA: Diagnosis not present

## 2022-05-06 DIAGNOSIS — B351 Tinea unguium: Secondary | ICD-10-CM | POA: Diagnosis not present

## 2022-05-06 DIAGNOSIS — B353 Tinea pedis: Secondary | ICD-10-CM | POA: Diagnosis not present

## 2022-05-06 DIAGNOSIS — E1142 Type 2 diabetes mellitus with diabetic polyneuropathy: Secondary | ICD-10-CM | POA: Diagnosis not present

## 2022-05-06 DIAGNOSIS — L84 Corns and callosities: Secondary | ICD-10-CM

## 2022-05-06 MED ORDER — KETOCONAZOLE 2 % EX CREA: TOPICAL_CREAM | CUTANEOUS | 0 refills | Status: DC

## 2022-05-06 NOTE — Patient Instructions (Signed)
Athlete's Foot Athlete's foot (tinea pedis) is a fungal infection of the skin on your feet. It often occurs on the skin that is between or underneath the toes. It can also occur on the soles of your feet. The infection can spread from person to person (is contagious). It can also spread when a person's bare feet come in contact with the fungus on shower floors or on items such as shoes. What are the causes? This condition is caused by a fungus that grows in warm, moist places. You can get athlete's foot by sharing shoes, shower stalls, towels, and wet floors with someone who is infected. Not washing your feet or changing your socks often enough can also lead to athlete's foot. What increases the risk? This condition is more likely to develop in: Men. People who have a weak body defense system (immune system). People who have diabetes. People who use public showers, such as at a gym. People who wear heavy-duty shoes, such as industrial or military shoes. Seasons with warm, humid weather. What are the signs or symptoms? Symptoms of this condition include: Itchy areas between your toes or on the soles of your feet. White, flaky, or scaly areas between your toes or on the soles of your feet. Very itchy small blisters between your toes or on the soles of your feet. Small cuts in your skin. These cuts can become infected. Thick or discolored toenails. How is this diagnosed? This condition may be diagnosed with a physical exam and a review of your medical history. Your health care provider may also take a skin or toenail sample to examine under a microscope. How is this treated? This condition is treated with antifungal medicines. These may be applied as powders, ointments, or creams. In severe cases, an oral antifungal medicine may be given. Follow these instructions at home: Medicines Apply or take over-the-counter and prescription medicines only as told by your health care provider. Apply your  antifungal medicine as told by your health care provider. Do not stop using the antifungal even if your condition improves. Foot care Do not scratch your feet. Keep your feet dry: Wear cotton or wool socks. Change your socks every day or if they become wet. Wear shoes that allow air to flow, such as sandals or canvas tennis shoes. Wash and dry your feet, including the area between your toes. Also, wash and dry your feet: Every day or as told by your health care provider. After exercising. General instructions Do not let others use towels, shoes, nail clippers, or other personal items that touch your feet. Protect your feet by wearing sandals in wet areas, such as locker rooms and shared showers. Keep all follow-up visits. This is important. If you have diabetes, keep your blood sugar under control. Contact a health care provider if: You have a fever. You have swelling, soreness, warmth, or redness in your foot. Your feet are not getting better with treatment. Your symptoms get worse. You have new symptoms. You have severe pain. Summary Athlete's foot (tinea pedis) is a fungal infection of the skin on your feet. It often occurs on skin that is between or underneath the toes. This condition is caused by a fungus that grows in warm, moist places. Symptoms include white, flaky, or scaly areas between your toes or on the soles of your feet. This condition is treated with antifungal medicines. Keep your feet clean. Always dry them thoroughly. This information is not intended to replace advice given to you by   your health care provider. Make sure you discuss any questions you have with your health care provider. Document Revised: 02/08/2021 Document Reviewed: 02/08/2021 Elsevier Patient Education  2023 Elsevier Inc.  

## 2022-05-12 NOTE — Progress Notes (Signed)
  Subjective:  Patient ID: Sonya Walton, female    DOB: [birth year],  MRN: [id number]  Sonya Walton presents to clinic today for at risk foot care with history of diabetic neuropathy and callus(es) b/l lower extremities and painful thick toenails that are difficult to trim. Painful toenails interfere with ambulation. Aggravating factors include wearing enclosed shoe gear. Pain is relieved with periodic professional debridement. Painful calluses are aggravated when weightbearing with and without shoegear. Pain is relieved with periodic professional debridement.  Patient states blood glucose was 108 mg/dl today.  Last A1c was 6.8%.  New problem(s): None.   PCP is Sonya Guiles, DO , and last visit was April, 2023.  Allergies  Allergen Reactions   Percocet [Oxycodone-Acetaminophen] Other (See Comments)    Dysphoria    Review of Systems: Negative except as noted in the HPI.  Objective: No changes noted in today's physical examination. There were no vitals filed for this visit.  Sonya Walton is a pleasant 61 y.o. female in NAD. AAO X 3.  Vascular Examination: CFT <3 seconds b/l LE. Palpable DP/PT pulses b/l LE. Digital hair sparse b/l. Skin temperature gradient WNL b/l. No pain with calf compression b/l. No edema noted b/l. No cyanosis or clubbing noted b/l LE.  Dermatological Examination: Pedal integument with normal turgor, texture and tone b/l LE. No open wounds b/l. No interdigital macerations b/l. Toenails 1-5 b/l elongated, thickened, discolored with subungual debris. +Tenderness with dorsal palpation of nailplates. Hyperkeratotic lesion(s) 5th metatarsal head right lower extremity.  No erythema, no edema, no drainage, no fluctuance. Diffuse scaling noted peripherally and plantarly b/l feet.  No interdigital macerations.  No blisters, no weeping. No signs of secondary bacterial infection noted.   Musculoskeletal Examination: Normal muscle strength 5/5 to all lower  extremity muscle groups bilaterally. No pain, crepitus or joint limitation noted with ROM b/l LE. No gross bony pedal deformities b/l. Patient ambulates independently without assistive aids.  Neurological Examination: Protective sensation intact 5/5 intact bilaterally with 10g monofilament b/l. Vibratory sensation decreased b/l. Proprioception intact bilaterally.     Latest Ref Rng & Units 02/12/2022   11:23 AM 11/13/2021   11:05 AM 08/12/2021    2:00 PM 06/04/2021    9:53 AM  Hemoglobin A1C  Hemoglobin-A1c 0.0 - 7.0 % 6.2  5.9  6.8  9.2    Assessment/Plan: 1. Pain due to onychomycosis of toenails of both feet   2. Callus   3. Tinea pedis of both feet   4. Diabetic peripheral neuropathy associated with type 2 diabetes mellitus ([place])      -Examined patient. -Continue foot and shoe inspections daily. Monitor blood glucose per PCP/Endocrinologist's recommendations. -Patient to continue soft, supportive shoe gear daily. -Toenails 1-5 b/l were debrided in length and girth with sterile nail nippers and dremel without iatrogenic bleeding.  -Callus(es) 5th metatarsal head right lower extremity pared utilizing sterile scalpel blade without complication or incident. Total number debrided =1. -For tinea pedis, Rx sent to pharmacy for Ketoconazole Cream 2% to be applied once daily for six weeks. -Dispensed tube foam. Apply to R 5th toe every morning. Remove every evening. -Patient/POA to call should there be question/concern in the interim.   Return in about 3 months (around 08/06/2022).  Marzetta Board, DPM

## 2022-05-14 ENCOUNTER — Ambulatory Visit (INDEPENDENT_AMBULATORY_CARE_PROVIDER_SITE_OTHER): Payer: 59 | Admitting: Family Medicine

## 2022-05-14 ENCOUNTER — Encounter: Payer: Self-pay | Admitting: Family Medicine

## 2022-05-14 VITALS — BP 128/68 | HR 72 | Wt 209.0 lb

## 2022-05-14 DIAGNOSIS — I10 Essential (primary) hypertension: Secondary | ICD-10-CM

## 2022-05-14 DIAGNOSIS — B3731 Acute candidiasis of vulva and vagina: Secondary | ICD-10-CM

## 2022-05-14 DIAGNOSIS — L723 Sebaceous cyst: Secondary | ICD-10-CM

## 2022-05-14 DIAGNOSIS — E785 Hyperlipidemia, unspecified: Secondary | ICD-10-CM

## 2022-05-14 DIAGNOSIS — E1165 Type 2 diabetes mellitus with hyperglycemia: Secondary | ICD-10-CM

## 2022-05-14 LAB — POCT GLYCOSYLATED HEMOGLOBIN (HGB A1C): HbA1c, POC (controlled diabetic range): 6.5 % (ref 0.0–7.0)

## 2022-05-14 MED ORDER — RELION PEN NEEDLES 32G X 4 MM MISC: 0 refills | Status: DC

## 2022-05-14 MED ORDER — ATENOLOL-CHLORTHALIDONE 50-25 MG PO TABS: 1.0000 | ORAL_TABLET | Freq: Every day | ORAL | 0 refills | Status: DC

## 2022-05-14 MED ORDER — ROSUVASTATIN CALCIUM 10 MG PO TABS: 10.0000 mg | ORAL_TABLET | Freq: Every day | ORAL | 3 refills | Status: DC

## 2022-05-14 MED ORDER — BASAGLAR KWIKPEN 100 UNIT/ML ~~LOC~~ SOPN: 18.0000 [IU] | PEN_INJECTOR | Freq: Every day | SUBCUTANEOUS | Status: DC

## 2022-05-14 MED ORDER — DOXYCYCLINE HYCLATE 100 MG PO TABS: 100.0000 mg | ORAL_TABLET | Freq: Two times a day (BID) | ORAL | 0 refills | Status: AC

## 2022-05-14 MED ORDER — FLUCONAZOLE 150 MG PO TABS: 150.0000 mg | ORAL_TABLET | Freq: Once | ORAL | 0 refills | Status: AC

## 2022-05-14 NOTE — Progress Notes (Addendum)
     SUBJECTIVE:   CHIEF COMPLAINT / HPI:   Sonya Walton is a 61 y.o. female presents for diabetes  Sebaceous cyst Pt had sebaceous cyst back in Jan and treated with 7 days of doxycycline. She reports the cyst has reappeared and is causing her discomfort again  Vulval pruritus Pt reports feeling itching in genital area. She tried OTC fungal cream which has not helped.  Hypertension Patient's current antihypertensive  medications include: tenoretic. Compliant with medications and tolerating well without side effects. Denies any SOB, CP, vision changes, LE edema, medication SEs, or symptoms of hypotension.   Most recent creatinine trend:  Lab Results  Component Value Date   CREATININE 0.69 03/11/2022   CREATININE 0.75 09/14/2021   CREATININE 0.51 08/18/2021     Patient has had a BMP in the past 1 year.  HLD Patient is currently taking rosuvastatin. Endorses compliance and tolerating well without side effects. Denies RUQ pain or myalgias.   Diabetes Patient's current diabetic medications include jardiance, basaglar 22 units. Tolerating well without side effects.  Patient endorses compliance with these medications. CBG readings averaging in the 100s.  Patient's last A1c was  Lab Results  Component Value Date   HGBA1C 6.5 05/14/2022   HGBA1C 6.2 02/12/2022   HGBA1C 5.9 11/13/2021    Denies abdominal pain, blurred vision, polyuria, polydipsia, hypoglycemia. Patient states they understand that diet and exercise can help with her diabetes.  Last Microalbumin, LDL, Creatinine: Lab Results  Component Value Date   LDLCALC 55 05/14/2022   CREATININE 0.69 03/11/2022     Flowsheet Row Office Visit from 05/14/2022 in [facility]  PHQ-9 Total Score 0        Health Maintenance Due  Topic   Zoster Vaccines- Shingrix (1 of 2)     PERTINENT  PMH / PSH: HTN, arthritis, diabetes  OBJECTIVE:   BP 128/68   Pulse 72   Wt 209 lb (94.8 kg)   SpO2 96%    BMI 37.02 kg/m    General: Alert, no acute distress Cardio: well perfused  Pulm: normal work of breathing Neuro: Cranial nerves grossly intact   5cm sebaceous cyst over right side of scalp  ASSESSMENT/PLAN:   Hypertension BP at goal. Continue Tenoretic at current dose.  HLD (hyperlipidemia) Obtained lipid panel today. Mildly elevated triglycerides. Continue Rosuvastatin 10mg .  Sebaceous cyst PRE-OP DIAGNOSIS:  Sebaceous cyst  POST-OP DIAGNOSIS: Same  PROCEDURE: incision and drainage of abscess Performing Physician: Lattie Haw   PROCEDURE:  A timeout protocol was performed prior to initiating the procedure.   The area was prepared and draped in the usual, sterile manner. The site  was anesthetized with 1% lidocaine with epinephrine. A linear incision  along the local skin lines was made and the purulent material expressed.  The abcess was explored thoroughly and sequestered pockets were opened.  Bleeding was minimal.   Followup: The patient tolerated the procedure well without  complications. Prescribed 7 days of doxycycline. Standard post-procedure care is explained and return  precautions are given.  Diabetes mellitus with hyperglycemia (HCC) A1c 6.5. Goal for age 26-8. Congratulated pt. Decreased Basaglar from 22 units to 18 units. Aim is for pt to come off insulin and continue jardiance only. Follow up in 3 months.  Vaginal yeast infection Prescribed 150mg  diflucan for presumed vulval yeast infection.    Lattie Haw, MD PGY-3 [place]

## 2022-05-14 NOTE — Patient Instructions (Signed)
Thank you for coming to see me today. It was a pleasure. Today we discussed your a1c it looks great, reduce basaglar to 18 units.. I recommend doxycycline for the scalp cyst for 7 days.   We will get some labs today.  If they are abnormal or we need to do something about them, I will call you.  If they are normal, I will send you a message on MyChart (if it is active) or a letter in the mail.  If you don't hear from [place] in 2 weeks, please call the office at the number below.   Please follow-up with me as needed   If you have any questions or concerns, please do not hesitate to call the office at [phone].  Best wishes,   Dr Posey Pronto

## 2022-05-15 LAB — LIPID PANEL
Chol/HDL Ratio: 2.9 ratio (ref 0.0–4.4)
Cholesterol, Total: 132 mg/dL (ref 100–199)
HDL: 46 mg/dL (ref >39)
LDL Chol Calc (NIH): 55 mg/dL (ref 0–99)
Triglycerides: 187 mg/dL — ABNORMAL HIGH (ref 0–149)
VLDL Cholesterol Cal: 31 mg/dL (ref 5–40)

## 2022-05-16 DIAGNOSIS — E785 Hyperlipidemia, unspecified: Secondary | ICD-10-CM | POA: Insufficient documentation

## 2022-05-16 NOTE — Assessment & Plan Note (Addendum)
A1c 6.5. Goal for age 62-8. Congratulated pt. Decreased Basaglar from 22 units to 18 units. Aim is for pt to come off insulin and continue jardiance only. Follow up in 3 months.

## 2022-05-16 NOTE — Assessment & Plan Note (Signed)
BP at goal. Continue Tenoretic at current dose.

## 2022-05-16 NOTE — Assessment & Plan Note (Signed)
Prescribed 150mg  diflucan for presumed vulval yeast infection.

## 2022-05-16 NOTE — Assessment & Plan Note (Addendum)
PRE-OP DIAGNOSIS:  Sebaceous cyst  POST-OP DIAGNOSIS: Same  PROCEDURE: incision and drainage of abscess Performing Physician: Lattie Haw   PROCEDURE:  A timeout protocol was performed prior to initiating the procedure.   The area was prepared and draped in the usual, sterile manner. The site  was anesthetized with 1% lidocaine with epinephrine. A linear incision  along the local skin lines was made and the purulent material expressed.  The abcess was explored thoroughly and sequestered pockets were opened.  Bleeding was minimal.   Followup: The patient tolerated the procedure well without  complications. Prescribed 7 days of doxycycline. Standard post-procedure care is explained and return  precautions are given.

## 2022-05-16 NOTE — Assessment & Plan Note (Signed)
Obtained lipid panel today. Mildly elevated triglycerides. Continue Rosuvastatin 10mg .

## 2022-06-09 ENCOUNTER — Telehealth: Payer: Self-pay | Admitting: Internal Medicine

## 2022-06-09 NOTE — Telephone Encounter (Signed)
Called patient regarding upcoming November appointments, patient is notified. 

## 2022-07-07 ENCOUNTER — Other Ambulatory Visit (HOSPITAL_COMMUNITY): Payer: Self-pay

## 2022-07-15 ENCOUNTER — Telehealth: Payer: Self-pay

## 2022-07-15 ENCOUNTER — Other Ambulatory Visit (HOSPITAL_COMMUNITY): Payer: Self-pay

## 2022-07-15 NOTE — Telephone Encounter (Signed)
Patient calls nurse line requesting to speak with Rosendo Gros regarding questions that she has on medication assistance paperwork.   Will forward to [place].   Talbot Grumbling, RN

## 2022-07-19 ENCOUNTER — Telehealth: Payer: Self-pay | Admitting: Student

## 2022-07-19 NOTE — Telephone Encounter (Signed)
Patient dropped off forms that you requested. I have placed them in your box.  Thanks!

## 2022-07-21 ENCOUNTER — Other Ambulatory Visit (HOSPITAL_COMMUNITY): Payer: Self-pay

## 2022-07-21 ENCOUNTER — Telehealth: Payer: Self-pay

## 2022-07-21 NOTE — Telephone Encounter (Signed)
A Prior Authorization was initiated for this patients JARDIANCE 10MG   through CoverMyMeds.   Key:  [id number]

## 2022-07-22 ENCOUNTER — Other Ambulatory Visit (HOSPITAL_COMMUNITY): Payer: Self-pay

## 2022-07-22 NOTE — Telephone Encounter (Signed)
Pt called back, 0 refills remain for Jardiance thru [employer]. Attempted to call in one last refill. Per rep, patients "application" expired 8/56/31.

## 2022-07-22 NOTE — Telephone Encounter (Signed)
Was re-enrolling patient for patient assistance for meds Basaglar & Jardiance. Pt now has commercial coverage and no longer eligible with companies.   Informed pt her basaglar should cost her $35 for month supply at the pharmacy. She currentlty has 3 boxes & 1 pen remaining at home.   Also informed patient that insurance required a PA for her Jardiance. After approved, her copay is $556 for month supply. Attempted a copay card but it will only cover $175 of her copay. Patients enrollment with [employer] for the jardiance will end 07/24/22. Pt has 21 tabs left at home. I encouraged her to call [employer] asap to request one last refill before expiration.   Pt said she will discuss jardiance situation at her appt 08/02/22.

## 2022-07-22 NOTE — Telephone Encounter (Signed)
Prior Auth for patients medication JARDIANCE approved by [employer]/ [employer] from 07/21/22 to 07/22/23.  Key: [id number]   COPAY IS $500+ FOR 30 DAY SUPPLY (Jardiance copay card will only cover up to $175). No longer eligible for patient assistance thru medication company due to [employer].  Will inform pcp & patient.

## 2022-08-01 NOTE — Progress Notes (Unsigned)
  SUBJECTIVE:   CHIEF COMPLAINT / HPI:   Type 2 Diabetes: Home medications include: basaglar 18u daily, Jardiance 10mg  daily. {Blank single:19197::"Does","Does not"} endorse compliance. Home glucose monitoring {home testing:315145}.   Last Microalbumin, LDL, Creatinine: Lab Results  Component Value Date   LDLCALC 55 05/14/2022   CREATININE 0.69 03/11/2022   Patient is up to date on diabetic eye. Patient is up to date on diabetic foot exam.   PERTINENT  PMH / PSH: HTN, T2DM, HLD, lung cancer s/p RLL lobectomy  OBJECTIVE:  There were no vitals taken for this visit.  General: NAD, pleasant, able to participate in exam Cardiac: RRR, no murmurs auscultated Respiratory: CTAB, normal WOB Abdomen: soft, non-tender, non-distended, normoactive bowel sounds Extremities: warm and well perfused, no edema or cyanosis Skin: warm and dry, no rashes noted Neuro: alert, no obvious focal deficits, speech normal Psych: Normal affect and mood  ASSESSMENT/PLAN:  There are no diagnoses linked to this encounter. No follow-ups on file. Wells Guiles, DO 08/01/2022, 2:43 PM PGY-***, [facility] {    This will disappear when note is signed, click to select method of visit    :1}

## 2022-08-02 ENCOUNTER — Ambulatory Visit (INDEPENDENT_AMBULATORY_CARE_PROVIDER_SITE_OTHER): Payer: 59 | Admitting: Student

## 2022-08-02 ENCOUNTER — Encounter: Payer: Self-pay | Admitting: Student

## 2022-08-02 VITALS — BP 119/64 | HR 66 | Ht 63.0 in | Wt 212.2 lb

## 2022-08-02 DIAGNOSIS — Z23 Encounter for immunization: Secondary | ICD-10-CM

## 2022-08-02 DIAGNOSIS — L259 Unspecified contact dermatitis, unspecified cause: Secondary | ICD-10-CM | POA: Diagnosis not present

## 2022-08-02 DIAGNOSIS — E119 Type 2 diabetes mellitus without complications: Secondary | ICD-10-CM | POA: Diagnosis not present

## 2022-08-02 DIAGNOSIS — Z794 Long term (current) use of insulin: Secondary | ICD-10-CM | POA: Diagnosis not present

## 2022-08-02 DIAGNOSIS — Z0000 Encounter for general adult medical examination without abnormal findings: Secondary | ICD-10-CM

## 2022-08-02 MED ORDER — TRIAMCINOLONE ACETONIDE 0.5 % EX OINT: 1.0000 | TOPICAL_OINTMENT | Freq: Two times a day (BID) | CUTANEOUS | 0 refills | Status: DC

## 2022-08-02 NOTE — Patient Instructions (Signed)
It was great to see you today! Thank you for choosing [facility] for your primary care. Sonya Walton was seen for diabetes and rash on hands.  Today we addressed: Diabetes: I would recommend decreasing Basaglar amount by 2 units every few days and continuing to monitor your glucose.  Your glucose should remain less than 150 every morning.  As long as that is the case he may continue to decrease your insulin dosage.  Increasing your exercise and losing some weight will also have significant benefits with your insulin requirement.  Please try to incorporate this into your regular routine.  I am checking your urine today for protein. Rash on hands: I suspect this is an irritant dermatitis.  Given that it has not responded to ketoconazole, we will try a more potent steroid.  Please apply triamcinolone twice a day for 1 week, 2 weeks at the most.  Please do not use this to excessively as it may thin the skin.  If it does not respond, please return for further evaluation.  If you haven't already, sign up for My Chart to have easy access to your labs results, and communication with your primary care physician.  We are checking some labs today. If they are abnormal, I will call you. If they are normal, I will send you a MyChart message (if it is active) or a letter in the mail. If you do not hear about your labs in the next 2 weeks, please call the office. Call the clinic at [phone] if your symptoms worsen or you have any concerns.  You should return to our clinic Return if symptoms worsen or fail to improve. Please arrive 15 minutes before your appointment to ensure smooth check in process.  We appreciate your efforts in making this happen.  Thank you for allowing me to participate in your care, Wells Guiles, DO 08/02/2022, 11:09 AM PGY-2, [place]

## 2022-08-02 NOTE — Assessment & Plan Note (Signed)
States she will get shingles vaccine at her local pharmacy.  Asked patient to let us know if she does that.

## 2022-08-02 NOTE — Assessment & Plan Note (Addendum)
Did not improve with ketoconazole cream which was prescribed by her podiatrist therefore less likely to be tinea manuum.  Suspect more likely a contact dermatitis which should respond to triamcinolone.  Does not have appearance consistent with scabies.  If does not improve, return for reevaluation and potential biopsy.  Other differential to consider may be palmoplantar pustulosis, palmoplantar plaque psoriasis.

## 2022-08-02 NOTE — Assessment & Plan Note (Signed)
Well-controlled, last A1c 6.5.  Given low ranges of fasting glucose, decrease Basaglar 2 units daily every few days.  If fasting glucose begins to get > 150 then may stop decreasing Basaglar dosage.  Continue Jardiance.  Advised exercise given patient is very motivated to get off insulin and this may help her to do so.

## 2022-08-03 ENCOUNTER — Telehealth: Payer: Self-pay

## 2022-08-03 ENCOUNTER — Other Ambulatory Visit (HOSPITAL_COMMUNITY): Payer: Self-pay

## 2022-08-03 LAB — MICROALBUMIN / CREATININE URINE RATIO
Creatinine, Urine: 23.2 mg/dL
Microalb/Creat Ratio: <13 mg/g creat (ref 0–29)
Microalbumin, Urine: <3 ug/mL

## 2022-08-03 NOTE — Telephone Encounter (Signed)
Pt called to f/u with me about Jardiance cost. She forgot to mention our previous convo, regarding the copay, at her appt 08/02/22.  Per 07/19/22 chart note: Also informed patient that insurance required a PA for her Jardiance. After approved, her copay is $556 for month supply. Attempted a copay card but it will only cover $175 of her copay. Patients enrollment with [employer] for the jardiance will end 07/24/22.  Pt doesn't qualify for patient assistance any longer due to having commercial coverage.  Informed her I would route this info to her provider to see about further options.

## 2022-08-05 ENCOUNTER — Ambulatory Visit (INDEPENDENT_AMBULATORY_CARE_PROVIDER_SITE_OTHER): Payer: 59 | Admitting: Podiatry

## 2022-08-05 DIAGNOSIS — Z91199 Patient's noncompliance with other medical treatment and regimen due to unspecified reason: Secondary | ICD-10-CM

## 2022-08-05 NOTE — Progress Notes (Signed)
1. No-show for appointment

## 2022-08-09 ENCOUNTER — Other Ambulatory Visit (HOSPITAL_COMMUNITY): Payer: Self-pay

## 2022-08-09 NOTE — Telephone Encounter (Signed)
Patient returns call to nurse line regarding Jardiance cost and if she should switch to alternative medication.   Will forward to provider.   Talbot Grumbling, RN

## 2022-08-11 ENCOUNTER — Encounter: Payer: Self-pay | Admitting: Family Medicine

## 2022-08-11 ENCOUNTER — Other Ambulatory Visit (HOSPITAL_COMMUNITY): Payer: Self-pay

## 2022-08-11 NOTE — Telephone Encounter (Signed)
PA approved for [place] on patients insurance (approved thru 08/12/23). Ran test claim and copay is $15. Medication will just need to be sent to pt's pharmacy.

## 2022-08-12 ENCOUNTER — Other Ambulatory Visit: Payer: Self-pay | Admitting: Student

## 2022-08-12 ENCOUNTER — Encounter: Payer: Self-pay | Admitting: Podiatry

## 2022-08-12 ENCOUNTER — Ambulatory Visit (INDEPENDENT_AMBULATORY_CARE_PROVIDER_SITE_OTHER): Payer: 59 | Admitting: Podiatry

## 2022-08-12 DIAGNOSIS — M79674 Pain in right toe(s): Secondary | ICD-10-CM

## 2022-08-12 DIAGNOSIS — M79675 Pain in left toe(s): Secondary | ICD-10-CM | POA: Diagnosis not present

## 2022-08-12 DIAGNOSIS — E1142 Type 2 diabetes mellitus with diabetic polyneuropathy: Secondary | ICD-10-CM | POA: Diagnosis not present

## 2022-08-12 DIAGNOSIS — B351 Tinea unguium: Secondary | ICD-10-CM | POA: Diagnosis not present

## 2022-08-12 DIAGNOSIS — E119 Type 2 diabetes mellitus without complications: Secondary | ICD-10-CM

## 2022-08-12 DIAGNOSIS — L84 Corns and callosities: Secondary | ICD-10-CM

## 2022-08-12 DIAGNOSIS — Z794 Long term (current) use of insulin: Secondary | ICD-10-CM

## 2022-08-12 MED ORDER — DAPAGLIFLOZIN PROPANEDIOL 5 MG PO TABS: 5.0000 mg | ORAL_TABLET | Freq: Every day | ORAL | 0 refills | Status: DC

## 2022-08-12 NOTE — Progress Notes (Signed)
  Subjective:  Patient ID: Sonya Walton, female    DOB: [birth year],  MRN: [id number]  Sonya Walton presents to clinic today for at risk foot care with h/o diabetic neuropathy:  Chief Complaint  Patient presents with   Nail Problem    Diabetic foot care BS-110 A1C-6.5 PCP-Anton Dahbura PCP VST-08/02/2022   New problem(s): None.   PCP is Wells Guiles, DO.  Allergies  Allergen Reactions   Percocet [Oxycodone-Acetaminophen] Other (See Comments)    Dysphoria    Review of Systems: Negative except as noted in the HPI.  Objective: No changes noted in today's physical examination.  Sonya Walton is a pleasant 61 y.o. female in NAD. AAO x 3.  Vascular Examination: CFT <3 seconds b/l LE. Palpable DP/PT pulses b/l LE. Digital hair sparse b/l. Skin temperature gradient WNL b/l. No pain with calf compression b/l. No edema noted b/l. No cyanosis or clubbing noted b/l LE.  Dermatological Examination: Pedal integument with normal turgor, texture and tone b/l LE. No open wounds b/l. No interdigital macerations b/l. Toenails 1-5 b/l elongated, thickened, discolored with subungual debris. +Tenderness with dorsal palpation of nailplates.   Hyperkeratotic lesion(s) 5th metatarsal head right lower extremity.  No erythema, no edema, no drainage, no fluctuance.   Musculoskeletal Examination: Normal muscle strength 5/5 to all lower extremity muscle groups bilaterally. No pain, crepitus or joint limitation noted with ROM b/l LE. No gross bony pedal deformities b/l. Patient ambulates independently without assistive aids.  Neurological Examination: Protective sensation intact 5/5 intact bilaterally with 10g monofilament b/l. Vibratory sensation decreased b/l. Proprioception intact bilaterally.  Assessment/Plan: 1. Pain due to onychomycosis of toenails of both feet   2. Callus   3. Diabetic peripheral neuropathy associated with type 2 diabetes mellitus ([place])     No orders of the defined  types were placed in this encounter.   -Patient was evaluated and treated. All patient's and/or POA's questions/concerns answered on today's visit. -Continue diabetic foot care principles: inspect feet daily, monitor glucose as recommended by PCP and/or Endocrinologist, and follow prescribed diet per PCP, Endocrinologist and/or dietician. -Mycotic toenails 1-5 bilaterally were debrided in length and girth with sterile nail nippers and dremel without incident. -Callus(es) 5th metatarsal head right lower extremity pared utilizing sterile scalpel blade without complication or incident. Total number debrided =1. -Patient/POA to call should there be question/concern in the interim.   Return in about 3 months (around 11/12/2022).  Marzetta Board, DPM

## 2022-08-16 ENCOUNTER — Ambulatory Visit (HOSPITAL_BASED_OUTPATIENT_CLINIC_OR_DEPARTMENT_OTHER): Payer: 59 | Admitting: Family

## 2022-08-16 ENCOUNTER — Encounter (HOSPITAL_BASED_OUTPATIENT_CLINIC_OR_DEPARTMENT_OTHER): Payer: Self-pay | Admitting: Family

## 2022-08-16 VITALS — BP 114/70 | HR 68 | Ht 63.0 in | Wt 214.0 lb

## 2022-08-16 DIAGNOSIS — E119 Type 2 diabetes mellitus without complications: Secondary | ICD-10-CM

## 2022-08-16 DIAGNOSIS — Z794 Long term (current) use of insulin: Secondary | ICD-10-CM

## 2022-08-16 DIAGNOSIS — E785 Hyperlipidemia, unspecified: Secondary | ICD-10-CM

## 2022-08-16 DIAGNOSIS — I10 Essential (primary) hypertension: Secondary | ICD-10-CM | POA: Diagnosis not present

## 2022-08-16 DIAGNOSIS — I25118 Atherosclerotic heart disease of native coronary artery with other forms of angina pectoris: Secondary | ICD-10-CM | POA: Diagnosis not present

## 2022-08-16 NOTE — Patient Instructions (Signed)
Medication Instructions:  Your Physician recommend you continue on your current medication as directed.    *If you need a refill on your cardiac medications before your next appointment, please call your pharmacy*  Follow-Up: At [facility], you and your health needs are our priority.  As part of our continuing mission to provide you with exceptional heart care, we have created designated Provider Care Teams.  These Care Teams include your primary Cardiologist (physician) and Advanced Practice Providers (APPs -  Physician Assistants and Nurse Practitioners) who all work together to provide you with the care you need, when you need it.  We recommend signing up for the patient portal called "MyChart".  Sign up information is provided on this After Visit Summary.  MyChart is used to connect with patients for Virtual Visits (Telemedicine).  Patients are able to view lab/test results, encounter notes, upcoming appointments, etc.  Non-urgent messages can be sent to your provider as well.   To learn more about what you can do with MyChart, go to [email or web address].    Your next appointment:   1 year(s)  The format for your next appointment:   In Person  Provider:   Freada Bergeron, MD  or [address] APP   Other Instructions Heart Healthy Diet Recommendations: A low-salt diet is recommended. Meats should be grilled, baked, or boiled. Avoid fried foods. Focus on lean protein sources like fish or chicken with vegetables and fruits. The American Heart Association is a Microbiologist!  American Heart Association Diet and Lifeystyle Recommendations   Exercise recommendations: The American Heart Association recommends 150 minutes of moderate intensity exercise weekly. Try 30 minutes of moderate intensity exercise 4-5 times per week. This could include walking, jogging, or swimming.   Important Information About Sugar

## 2022-08-16 NOTE — Progress Notes (Signed)
 Office Visit    Patient Name: Sonya Walton Date of Encounter: 08/16/2022  PCP:  Dahbura, Anton, DO   [facility]  Cardiologist:  Heather E Pemberton, MD  Advanced Practice Provider:  No care team member to display Electrophysiologist:  None   Chief Complaint    Sonya Walton is a 61 y.o. female with a hx of DM2, HTN, OA, CAD, former tobacco use presents today for follow up   Past Medical History    Past Medical History:  Diagnosis Date   Allergy    Arthritis    Coronary artery disease    mild by CT 02/05/21   Diabetes mellitus without complication (HCC)    Hypertension    states under control with med., has been on med. since 2011   Immature cataract    Osteoarthritis 12/2015   right AC joint   Thyroid nodule 2021   Wears dentures    upper   Past Surgical History:  Procedure Laterality Date   BREAST BIOPSY Left    BRONCHIAL BIOPSY  03/09/2021   Procedure: BRONCHIAL BIOPSIES;  Surgeon: Byrum, Robert S, MD;  [facility]: [facility];  Service: Pulmonary;;   BRONCHIAL BRUSHINGS  03/09/2021   Procedure: BRONCHIAL BRUSHINGS;  Surgeon: Byrum, Robert S, MD;  [facility]: [facility];  Service: Pulmonary;;   BRONCHIAL NEEDLE ASPIRATION BIOPSY  03/09/2021   Procedure: BRONCHIAL NEEDLE ASPIRATION BIOPSIES;  Surgeon: Byrum, Robert S, MD;  [facility]: [facility];  Service: Pulmonary;;   BUNIONECTOMY Bilateral 1994   CHOLECYSTECTOMY  01/17/2012   Procedure: LAPAROSCOPIC CHOLECYSTECTOMY;  Surgeon: Douglas A Blackman, MD;  Location: [facility];  Service: General;  Laterality: N/A;   FIDUCIAL MARKER PLACEMENT  03/09/2021   Procedure: FIDUCIAL MARKER PLACEMENT;  Surgeon: Byrum, Robert S, MD;  [facility]: [facility];  Service: Pulmonary;;   INTERCOSTAL NERVE BLOCK Right 08/14/2021   Procedure: INTERCOSTAL NERVE BLOCK;  Surgeon: Hendrickson, Steven C, MD;  [facility]: [facility];  Service: Thoracic;  Laterality: Right;   NODE DISSECTION Right 08/14/2021   Procedure: NODE  DISSECTION;  Surgeon: Hendrickson, Steven C, MD;  [facility];  Service: Thoracic;  Laterality: Right;   PUBOVAGINAL SLING  03/05/2003   SHOULDER ARTHROSCOPY WITH DISTAL CLAVICLE RESECTION Right 01/26/2016   Procedure: SHOULDER ARTHROSCOPY WITH DISTAL CLAVICLE RESECTION;  Surgeon: Justin Chandler, MD;  Location: [facility];  Service: Orthopedics;  Laterality: Right;   SHOULDER ARTHROSCOPY WITH SUBACROMIAL DECOMPRESSION Right 01/26/2016   Procedure: SHOULDER ARTHROSCOPY WITH SUBACROMIAL DECOMPRESSION DEBRIDEMENT;  Surgeon: Justin Chandler, MD;  Location: [facility];  Service: Orthopedics;  Laterality: Right;   TONSILLECTOMY  1975   TOTAL VAGINAL HYSTERECTOMY  03/05/2003   TUBAL LIGATION  1981   VAGINAL HYSTERECTOMY  2004   partial    VIDEO BRONCHOSCOPY WITH ENDOBRONCHIAL NAVIGATION N/A 03/09/2021   Procedure: VIDEO BRONCHOSCOPY WITH ENDOBRONCHIAL NAVIGATION;  Surgeon: Byrum, Robert S, MD;  [facility];  Service: Pulmonary;  Laterality: N/A;    Allergies  Allergies  Allergen Reactions   Percocet [Oxycodone-Acetaminophen] Other (See Comments)    Dysphoria    History of Present Illness    Sonya Walton is a 61 y.o. female with a hx of DM2, HTN, OA, tobacco use, CAD last seen 07/03/21.  Evaluated 12/2020 by Dr. Pemberton due to chest squeezing. CTA performed 01/2021 with mild nonobstructive CAD. Echo 02/26/21 LVEF 55-60%, no RWMA, LV mildly dilated, normal diastolic parameters, normal PASP, LA mildly dilated, trivial MR.  Seen in follow   up 07/03/21 doing well from cardiac perspective. No changes made.  Presents today for follow up.  She and Sonya sister live together and caretake for Sonya Walton who has dementia. She is one of 7 children and the other siblings help as well. Enjoys spending time with Sonya great grandchildren who are 40 years old and 37 months old. She notes occasional chest discomfort in Sonya chest 3 times since seen a year ago that occurs at rest  and resolves with belching. Denies indigestion. No exertional chest discomfort, dyspnea, lightheadedness, palpitations, edema, orthopnea, PND.   EKGs/Labs/Other Studies Reviewed:   The following studies were reviewed today:  Cardiac CTA 01/2021   IMPRESSION: 1. Mild nonobstructive CAD, CADRADS = 2. Highest concentration of plaque noted in the transition from the distal left main into the bifurcation of the LAD/LCx, without significant obstruction.   2. Coronary calcium score of 12. This was 99th percentile for age and sex matched control.   3.  Normal coronary origin with right dominance.   4.  Aortic atherosclerosis  Echo 02/26/21  1. Left ventricular ejection fraction, by estimation, is 55 to 60%. The  left ventricle has normal function. The left ventricle has no regional  wall motion abnormalities. The left ventricular internal cavity size was  mildly dilated. Left ventricular  diastolic parameters were normal.   2. Right ventricular systolic function is normal. The right ventricular  size is normal. There is normal pulmonary artery systolic pressure.   3. Left atrial size was mildly dilated.   4. The mitral valve is normal in structure. Trivial mitral valve  regurgitation. No evidence of mitral stenosis.   5. The aortic valve is tricuspid. Aortic valve regurgitation is not  visualized. No aortic stenosis is present.   EKG:  EKG ordered today. EKG performed today shows normal sinus rhythm 68 bpm with stable incomplete RBBB.  Recent Labs: 11/13/2021: TSH 1.500 03/11/2022: ALT 33; BUN 11; Creatinine 0.69; Hemoglobin 15.9; Platelet Count 233; Potassium 3.6; Sodium 141  Recent Lipid Panel    Component Value Date/Time   CHOL 132 05/14/2022 1348   CHOL 122 06/29/2021 1022   TRIG 187 (H) 05/14/2022 1348   TRIG 107 06/29/2021 1022   HDL 46 05/14/2022 1348   CHOLHDL 2.9 05/14/2022 1348   CHOLHDL 3.6 04/12/2016 0953   VLDL 24 04/12/2016 0953   LDLCALC 55 05/14/2022 1348    LDLCALC 95 10/05/2012 1817   Home Medications   Current Meds  Medication Sig   acetaminophen (TYLENOL) 500 MG tablet Take 500 mg by mouth every 6 (six) hours as needed for mild pain or moderate pain.   aspirin EC 81 MG tablet Take 1 tablet (81 mg total) by mouth daily. Swallow whole.   atenolol-chlorthalidone (TENORETIC) 50-25 MG tablet Take 1 tablet by mouth daily.   blood glucose meter kit and supplies KIT Inject 1 each into the skin as directed. Use meter once daily to measure blood glucose.   cetirizine (ZYRTEC) 10 MG tablet Take 10 mg by mouth daily as needed for allergies.   Cholecalciferol (VITAMIN D3) 2000 UNITS TABS Take 2,000 Units by mouth every morning.   dapagliflozin propanediol (FARXIGA) 5 MG TABS tablet Take 1 tablet (5 mg total) by mouth daily.   glucose blood ([employer] ADVANCED GLUCOSE TEST) test strip Please specify directions, refills and quantity_0 Once daily, [id number], 3 refills, 100 quantity Dr Lattie Haw MD   Insulin Glargine [facility]) 100 UNIT/ML Inject 18 Units into the skin daily.   Insulin  Pen Needle (RELION PEN NEEDLES) 32G X 4 MM MISC USE 1 NEEDLE DAILY AS DIRECTED   ketoconazole (NIZORAL) 2 % cream Apply to both fett and between toes once daily for 6 weeks.   potassium chloride SA (KLOR-CON M) 20 MEQ tablet Take 2 tablets (40 mEq total) by mouth daily.   rosuvastatin (CRESTOR) 10 MG tablet Take 1 tablet (10 mg total) by mouth daily.   triamcinolone ointment (KENALOG) 0.5 % Apply 1 Application topically 2 (two) times daily.     Review of Systems     All other systems reviewed and are otherwise negative except as noted above.  Physical Exam    VS:  BP 114/70   Pulse 68   Ht 5' 3" (1.6 m)   Wt 214 lb (97.1 kg)   BMI 37.91 kg/m  , BMI Body mass index is 37.91 kg/m.  Wt Readings from Last 3 Encounters:  08/16/22 214 lb (97.1 kg)  08/02/22 212 lb 3.2 oz (96.3 kg)  05/14/22 209 lb (94.8 kg)    GEN: Well nourished, overweight, well  developed, in no acute distress. HEENT: normal. Neck: Supple, no JVD, carotid bruits, or masses. Cardiac: RRR, no murmurs, rubs, or gallops. No clubbing, cyanosis, edema.  Radials/PT 2+ and equal bilaterally.  Respiratory:  Respirations regular and unlabored, clear to auscultation bilaterally. GI: Soft, nontender, nondistended. MS: No deformity or atrophy. Skin: Warm and dry, no rash. Neuro:  Strength and sensation are intact. Psych: Normal affect.  Assessment & Plan    CAD - Stable with no anginal symptoms. No indication for ischemic evaluation.  Mild-nonobstructive by CTA 01/2021.Continue Aspirin, 10m, Crestor 174mdaily. Heart healthy diet and regular cardiovascular exercise encouraged.    HLD. LDL goal <70 - 05/2022 LDL 55. Continue Crestor 1079maily.   HTN - BP well controlled. Continue current antihypertensive regimen.    DM2 - Continue to follow with PCP. Appreciate inclusion of Farxiga for cardioprotective benefit.   Disposition: Follow up in 1 year(s) with Dr. PemJohney Frame APP.  Signed, CaiLoel DubonnetP 08/16/2022, 11:08 AM [facility]

## 2022-08-18 ENCOUNTER — Other Ambulatory Visit (HOSPITAL_COMMUNITY): Payer: Self-pay

## 2022-08-19 ENCOUNTER — Telehealth: Payer: Self-pay

## 2022-08-19 ENCOUNTER — Ambulatory Visit: Payer: 59 | Admitting: Podiatry

## 2022-08-19 NOTE — Telephone Encounter (Signed)
Patient calls nurse line to give PCP an update on insulin regime and home cbgs.  Patient reports she is down to 10 units daily and her fasting sugars have been in the 112-128 range.  Patient would like PCP opinion on continuing to decrease units.   Will forward to PCP.

## 2022-08-24 NOTE — Telephone Encounter (Signed)
Patient updated.

## 2022-09-08 ENCOUNTER — Telehealth: Payer: Self-pay

## 2022-09-08 NOTE — Telephone Encounter (Signed)
Patient calls nurse line requesting to speak with Sonya Walton regarding Faxiga medication assistance.   Please return call to patient at [phone].  Talbot Grumbling, RN

## 2022-09-09 ENCOUNTER — Other Ambulatory Visit: Payer: 59

## 2022-09-10 ENCOUNTER — Ambulatory Visit
Admission: RE | Admit: 2022-09-10 | Discharge: 2022-09-10 | Disposition: A | Discharge status: Home / Self Care | Payer: 59 | Source: Ambulatory Visit | Attending: Internal Medicine | Admitting: Internal Medicine

## 2022-09-10 ENCOUNTER — Inpatient Hospital Stay: Payer: 59 | Attending: Internal Medicine

## 2022-09-10 ENCOUNTER — Other Ambulatory Visit: Payer: Self-pay

## 2022-09-10 DIAGNOSIS — C3490 Malignant neoplasm of unspecified part of unspecified bronchus or lung: Secondary | ICD-10-CM | POA: Diagnosis not present

## 2022-09-10 DIAGNOSIS — C3411 Malignant neoplasm of upper lobe, right bronchus or lung: Secondary | ICD-10-CM | POA: Insufficient documentation

## 2022-09-10 DIAGNOSIS — J9811 Atelectasis: Secondary | ICD-10-CM | POA: Diagnosis not present

## 2022-09-10 DIAGNOSIS — I10 Essential (primary) hypertension: Secondary | ICD-10-CM | POA: Diagnosis not present

## 2022-09-10 DIAGNOSIS — E1136 Type 2 diabetes mellitus with diabetic cataract: Secondary | ICD-10-CM | POA: Diagnosis not present

## 2022-09-10 DIAGNOSIS — Z87891 Personal history of nicotine dependence: Secondary | ICD-10-CM | POA: Insufficient documentation

## 2022-09-10 DIAGNOSIS — Z853 Personal history of malignant neoplasm of breast: Secondary | ICD-10-CM | POA: Diagnosis not present

## 2022-09-10 DIAGNOSIS — I700 Atherosclerosis of aorta: Secondary | ICD-10-CM | POA: Diagnosis not present

## 2022-09-10 LAB — CBC WITH DIFFERENTIAL (CANCER CENTER ONLY)
Abs Immature Granulocytes: 0.02 10*3/uL (ref 0.00–0.07)
Basophils Absolute: 0.1 10*3/uL (ref 0.0–0.1)
Basophils Relative: 1 %
Eosinophils Absolute: 0.4 10*3/uL (ref 0.0–0.5)
Eosinophils Relative: 3 %
HCT: 48.6 % — ABNORMAL HIGH (ref 36.0–46.0)
Hemoglobin: 16.4 g/dL — ABNORMAL HIGH (ref 12.0–15.0)
Immature Granulocytes: 0 %
Lymphocytes Relative: 42 %
Lymphs Abs: 4.7 10*3/uL — ABNORMAL HIGH (ref 0.7–4.0)
MCH: 28.9 pg (ref 26.0–34.0)
MCHC: 33.7 g/dL (ref 30.0–36.0)
MCV: 85.6 fL (ref 80.0–100.0)
Monocytes Absolute: 1.1 10*3/uL — ABNORMAL HIGH (ref 0.1–1.0)
Monocytes Relative: 10 %
Neutro Abs: 5 10*3/uL (ref 1.7–7.7)
Neutrophils Relative %: 44 %
Platelet Count: 223 10*3/uL (ref 150–400)
RBC: 5.68 MIL/uL — ABNORMAL HIGH (ref 3.87–5.11)
RDW: 15.7 % — ABNORMAL HIGH (ref 11.5–15.5)
WBC Count: 11.3 10*3/uL — ABNORMAL HIGH (ref 4.0–10.5)
nRBC: 0 % (ref 0.0–0.2)

## 2022-09-10 LAB — CMP (CANCER CENTER ONLY)
ALT: 32 U/L (ref 0–44)
AST: 29 U/L (ref 15–41)
Albumin: 4.1 g/dL (ref 3.5–5.0)
Alkaline Phosphatase: 81 U/L (ref 38–126)
Anion gap: 4 — ABNORMAL LOW (ref 5–15)
BUN: 11 mg/dL (ref 8–23)
CO2: 31 mmol/L (ref 22–32)
Calcium: 9.6 mg/dL (ref 8.9–10.3)
Chloride: 104 mmol/L (ref 98–111)
Creatinine: 0.63 mg/dL (ref 0.44–1.00)
GFR, Estimated: >60 mL/min (ref >60)
Glucose, Bld: 87 mg/dL (ref 70–99)
Potassium: 3.2 mmol/L — ABNORMAL LOW (ref 3.5–5.1)
Sodium: 139 mmol/L (ref 135–145)
Total Bilirubin: 0.7 mg/dL (ref 0.3–1.2)
Total Protein: 7.8 g/dL (ref 6.5–8.1)

## 2022-09-10 MED ORDER — IOPAMIDOL (ISOVUE-300) INJECTION 61%
75.0000 mL | Freq: Once | INTRAVENOUS | Status: AC | PRN
  Administered 2022-09-10: 75 mL via INTRAVENOUS

## 2022-09-10 NOTE — Telephone Encounter (Signed)
Patient called back to let me know she spoke with insurance. Her previous PA is approved until 08/2023, so her medication will continue to be covered next year.

## 2022-09-10 NOTE — Telephone Encounter (Signed)
Returned patients call.  Pt rec'd letter from insurance stating that Sonya Walton would not be on formulary starting Jan 2024. Informed patient that she could call insurance to get the preferred alternatives, or see if medication would possibly need another PA. Also informed patient that patient assistance will still be available for this medication if needed.

## 2022-09-13 ENCOUNTER — Other Ambulatory Visit: Payer: 59

## 2022-09-13 ENCOUNTER — Ambulatory Visit (HOSPITAL_COMMUNITY): Payer: 59

## 2022-09-15 ENCOUNTER — Other Ambulatory Visit: Payer: Self-pay

## 2022-09-15 ENCOUNTER — Inpatient Hospital Stay: Payer: 59 | Admitting: Internal Medicine

## 2022-09-15 VITALS — BP 145/77 | HR 77 | Temp 98.4°F | Resp 16 | Ht 63.0 in | Wt 215.3 lb

## 2022-09-15 DIAGNOSIS — C3411 Malignant neoplasm of upper lobe, right bronchus or lung: Secondary | ICD-10-CM | POA: Diagnosis not present

## 2022-09-15 DIAGNOSIS — Z87891 Personal history of nicotine dependence: Secondary | ICD-10-CM | POA: Diagnosis not present

## 2022-09-15 DIAGNOSIS — E1136 Type 2 diabetes mellitus with diabetic cataract: Secondary | ICD-10-CM | POA: Diagnosis not present

## 2022-09-15 DIAGNOSIS — C3490 Malignant neoplasm of unspecified part of unspecified bronchus or lung: Secondary | ICD-10-CM | POA: Diagnosis not present

## 2022-09-15 DIAGNOSIS — I10 Essential (primary) hypertension: Secondary | ICD-10-CM | POA: Diagnosis not present

## 2022-09-15 NOTE — Progress Notes (Signed)
    [facility] [phone]   [phone]  OFFICE PROGRESS NOTE  Dahbura, Anton, DO [address] [place] [address]  DIAGNOSIS:  Stage Ia (T1b, N0, M0) non-small cell lung cancer, adenocarcinoma presented with right upper lobe lung nodule diagnosed in October 2022.  PRIOR THERAPY: Status post  right upper lobectomy and lymph node dissection under the care of Dr. Hendrickson on 08/14/2021.  CURRENT THERAPY: Observation.  INTERVAL HISTORY: Sonya Walton 61 y.o. female returns to the clinic today for follow-up visit.  The patient is feeling fine today with no concerning complaints.  She denied having any current chest pain, shortness of breath, cough or hemoptysis.  She has no nausea, vomiting, diarrhea or constipation.  She has no headache or visual changes.  She denied having any significant weight loss or night sweats.  She is here today for evaluation with repeat CT scan of the chest for restaging of her disease.  MEDICAL HISTORY: Past Medical History:  Diagnosis Date   Allergy    Arthritis    Coronary artery disease    mild by CT 02/05/21   Diabetes mellitus without complication (HCC)    Hypertension    states under control with med., has been on med. since 2011   Immature cataract    Osteoarthritis 12/2015   right AC joint   Thyroid nodule 2021   Wears dentures    upper    ALLERGIES:  is allergic to percocet [oxycodone-acetaminophen].  MEDICATIONS:  Current Outpatient Medications  Medication Sig Dispense Refill   acetaminophen (TYLENOL) 500 MG tablet Take 500 mg by mouth every 6 (six) hours as needed for mild pain or moderate pain.     aspirin EC 81 MG tablet Take 1 tablet (81 mg total) by mouth daily. Swallow whole. 90 tablet 3   atenolol-chlorthalidone (TENORETIC) 50-25 MG tablet Take 1 tablet by mouth daily. 90 tablet 0   blood glucose meter kit and supplies KIT Inject 1 each into the skin as directed. Use meter once daily to  measure blood glucose. 1 each 0   cetirizine (ZYRTEC) 10 MG tablet Take 10 mg by mouth daily as needed for allergies.     Cholecalciferol (VITAMIN D3) 2000 UNITS TABS Take 2,000 Units by mouth every morning.     dapagliflozin propanediol (FARXIGA) 5 MG TABS tablet Take 1 tablet (5 mg total) by mouth daily. 90 tablet 0   glucose blood ([facility] ADVANCED GLUCOSE TEST) test strip Please specify directions, refills and quantity\ Once daily, NPI:[id number], 3 refills, 100 quantity Dr Poonam Patel MD 100 each 0   Insulin Glargine (BASAGLAR KWIKPEN) 100 UNIT/ML Inject 18 Units into the skin daily.     Insulin Pen Needle (RELION PEN NEEDLES) 32G X 4 MM MISC USE 1 NEEDLE DAILY AS DIRECTED 1000 each 0   ketoconazole (NIZORAL) 2 % cream Apply to both fett and between toes once daily for 6 weeks. 60 g 0   nitroGLYCERIN (NITROSTAT) 0.4 MG SL tablet Place 1 tablet (0.4 mg total) under the tongue every 5 (five) minutes as needed for chest pain. 30 tablet 12   potassium chloride SA (KLOR-CON M) 20 MEQ tablet Take 2 tablets (40 mEq total) by mouth daily. 180 tablet 3   rosuvastatin (CRESTOR) 10 MG tablet Take 1 tablet (10 mg total) by mouth daily. 90 tablet 3   triamcinolone ointment (KENALOG) 0.5 % Apply 1 Application topically 2 (two) times daily. 30 g 0   No   current facility-administered medications for this visit.    SURGICAL HISTORY:  Past Surgical History:  Procedure Laterality Date   BREAST BIOPSY Left    BRONCHIAL BIOPSY  03/09/2021   Procedure: BRONCHIAL BIOPSIES;  Surgeon: Collene Gobble, MD;  Location: [facility];  Service: Pulmonary;;   BRONCHIAL BRUSHINGS  03/09/2021   Procedure: BRONCHIAL BRUSHINGS;  Surgeon: Collene Gobble, MD;  Location: [facility];  Service: Pulmonary;;   BRONCHIAL NEEDLE ASPIRATION BIOPSY  03/09/2021   Procedure: BRONCHIAL NEEDLE ASPIRATION BIOPSIES;  Surgeon: Collene Gobble, MD;  Location: [facility];  Service: Pulmonary;;   BUNIONECTOMY Bilateral 1994   CHOLECYSTECTOMY   01/17/2012   Procedure: LAPAROSCOPIC CHOLECYSTECTOMY;  Surgeon: Harl Bowie, MD;  Location: [facility];  Service: General;  Laterality: N/A;   FIDUCIAL MARKER PLACEMENT  03/09/2021   Procedure: FIDUCIAL MARKER PLACEMENT;  Surgeon: Collene Gobble, MD;  Location: [facility];  Service: Pulmonary;;   INTERCOSTAL NERVE BLOCK Right 08/14/2021   Procedure: INTERCOSTAL NERVE BLOCK;  Surgeon: [name], MD;  Location: [facility];  Service: Thoracic;  Laterality: Right;   NODE DISSECTION Right 08/14/2021   Procedure: NODE DISSECTION;  Surgeon: [name], MD;  Location: [facility];  Service: Thoracic;  Laterality: Right;   PUBOVAGINAL SLING  03/05/2003   SHOULDER ARTHROSCOPY WITH DISTAL CLAVICLE RESECTION Right 01/26/2016   Procedure: SHOULDER ARTHROSCOPY WITH DISTAL CLAVICLE RESECTION;  Surgeon: Tania Ade, MD;  Location: [facility];  Service: Orthopedics;  Laterality: Right;   SHOULDER ARTHROSCOPY WITH SUBACROMIAL DECOMPRESSION Right 01/26/2016   Procedure: SHOULDER ARTHROSCOPY WITH SUBACROMIAL DECOMPRESSION DEBRIDEMENT;  Surgeon: Tania Ade, MD;  Location: [place];  Service: Orthopedics;  Laterality: Right;   TONSILLECTOMY  1975   TOTAL VAGINAL HYSTERECTOMY  03/05/2003   TUBAL LIGATION  1981   VAGINAL HYSTERECTOMY  2004   partial    VIDEO BRONCHOSCOPY WITH ENDOBRONCHIAL NAVIGATION N/A 03/09/2021   Procedure: VIDEO BRONCHOSCOPY WITH ENDOBRONCHIAL NAVIGATION;  Surgeon: Collene Gobble, MD;  Location: [facility];  Service: Pulmonary;  Laterality: N/A;    REVIEW OF SYSTEMS:  A comprehensive review of systems was negative.   PHYSICAL EXAMINATION: General appearance: alert, cooperative, and no distress Head: Normocephalic, without obvious abnormality, atraumatic Neck: no adenopathy, no JVD, supple, symmetrical, trachea midline, and thyroid not enlarged, symmetric, no tenderness/mass/nodules Lymph nodes: Cervical, supraclavicular, and axillary nodes  normal. Resp: clear to auscultation bilaterally Back: symmetric, no curvature. ROM normal. No CVA tenderness. Cardio: regular rate and rhythm, S1, S2 normal, no murmur, click, rub or gallop GI: soft, non-tender; bowel sounds normal; no masses,  no organomegaly Extremities: extremities normal, atraumatic, no cyanosis or edema  ECOG PERFORMANCE STATUS: 1 - Symptomatic but completely ambulatory  Blood pressure (!) 145/77, pulse 77, temperature 98.4 F (36.9 C), resp. rate 16, height 5' 3" (1.6 m), weight 215 lb 4.8 oz (97.7 kg), SpO2 95 %.  LABORATORY DATA: Lab Results  Component Value Date   WBC 11.3 (H) 09/10/2022   HGB 16.4 (H) 09/10/2022   HCT 48.6 (H) 09/10/2022   MCV 85.6 09/10/2022   PLT 223 09/10/2022      Chemistry      Component Value Date/Time   NA 139 09/10/2022 1256   NA 139 06/18/2021 1006   K 3.2 (L) 09/10/2022 1256   CL 104 09/10/2022 1256   CO2 31 09/10/2022 1256   BUN 11 09/10/2022 1256   BUN 9 06/18/2021 1006   CREATININE 0.63 09/10/2022 1256   CREATININE 0.69  04/12/2016 0953   GLU 160 (H) 06/29/2021 1022      Component Value Date/Time   CALCIUM 9.6 09/10/2022 1256   ALKPHOS 81 09/10/2022 1256   AST 29 09/10/2022 1256   ALT 32 09/10/2022 1256   BILITOT 0.7 09/10/2022 1256       RADIOGRAPHIC STUDIES: CT Chest W Contrast  Result Date: 09/13/2022 CLINICAL DATA:  Non-small cell lung cancer staging. History of breast cancer. Former smoker. * Tracking Code: BO * EXAM: CT CHEST WITH CONTRAST TECHNIQUE: Multidetector CT imaging of the chest was performed during intravenous contrast administration. RADIATION DOSE REDUCTION: This exam was performed according to the departmental dose-optimization program which includes automated exposure control, adjustment of the mA and/or kV according to patient size and/or use of iterative reconstruction technique. CONTRAST:  75mL ISOVUE-300 IOPAMIDOL (ISOVUE-300) INJECTION 61% COMPARISON:  None Available. FINDINGS:  Cardiovascular: Coronary artery calcification and aortic atherosclerotic calcification. Mediastinum/Nodes: Enlargement of the LEFT lobe of thyroid gland consistent with thyroid goiter. No change from prior No mediastinal lymphadenopathy.  No supraclavicular adenopathy. Lungs/Pleura: RIGHT lower lobe 6 mm pulmonary nodule more prominent comparison exam and measures 1 mm larger. Chronic atelectasis in the medial RIGHT upper lung following lobectomy. No change from prior. Airways normal. Normal pleural space Upper Abdomen: Limited view of the liver, kidneys, pancreas are unremarkable. Normal adrenal glands. Is Musculoskeletal: No aggressive osseous lesion IMPRESSION: 1. Interval increase in size of small RIGHT lobe pulmonary nodule. Recommend attention on routine oncology surveillance. 2. No mediastinal lymphadenopathy. 3. Stable chronic atelectasis in the RIGHT upper lung. Electronically Signed   By: Stewart  Edmunds M.D.   On: 09/13/2022 14:22    ASSESSMENT AND PLAN: This is a very pleasant 61 years old white female diagnosed with a stage Ia (T1b, N0, M0) non-small cell lung cancer, adenocarcinoma presented with right upper lobe lung nodule diagnosed in October 2022 status post right upper lobectomy with lymph node dissection under the care of Dr. Hendrickson on August 14, 2021. The patient is currently on observation and she is feeling fine with no concerning complaints. She had repeat CT scan of the chest performed recently.  I personally and independently reviewed the scan images and discussed the result with the patient today. Her scan showed slight increase and right lower lobe lung nodule by 1 mm and comparison to the previous imaging studies. I recommended for the patient to continue on observation with repeat CT scan of the chest in 6 months. She was advised to call immediately if she has any other concerning symptoms in the interval. The patient voices understanding of current disease status and  treatment options and is in agreement with the current care plan.  All questions were answered. The patient knows to call the clinic with any problems, questions or concerns. We can certainly see the patient much sooner if necessary.  The total time spent in the appointment was 20 minutes.  Disclaimer: This note was dictated with voice recognition software. Similar sounding words can inadvertently be transcribed and may not be corrected upon review.        

## 2022-09-27 ENCOUNTER — Other Ambulatory Visit: Payer: Self-pay

## 2022-09-27 DIAGNOSIS — I10 Essential (primary) hypertension: Secondary | ICD-10-CM

## 2022-09-28 MED ORDER — ATENOLOL-CHLORTHALIDONE 50-25 MG PO TABS: 1.0000 | ORAL_TABLET | Freq: Every day | ORAL | 2 refills | Status: DC

## 2022-11-03 ENCOUNTER — Other Ambulatory Visit: Payer: Self-pay

## 2022-11-03 MED ORDER — BASAGLAR KWIKPEN 100 UNIT/ML ~~LOC~~ SOPN: 18.0000 [IU] | PEN_INJECTOR | Freq: Every day | SUBCUTANEOUS | Status: DC

## 2022-11-03 NOTE — Telephone Encounter (Signed)
Patient calls nurse line requesting a refill on Basaglar.   She reports her insurance has changed and she can no longer get insulin from [facility].   Pleas send to [facility] on [address].

## 2022-11-05 ENCOUNTER — Other Ambulatory Visit: Payer: Self-pay | Admitting: Student

## 2022-11-05 DIAGNOSIS — E119 Type 2 diabetes mellitus without complications: Secondary | ICD-10-CM

## 2022-11-08 ENCOUNTER — Other Ambulatory Visit (HOSPITAL_COMMUNITY): Payer: Self-pay

## 2022-11-08 ENCOUNTER — Telehealth: Payer: Self-pay

## 2022-11-08 NOTE — Telephone Encounter (Signed)
Returned pt's call.  Called in a [place] card to pt's pharmacy. Was able to get copay down to $30.67.  Pt says she applied for medicaid and was told she would most likely be approved. Informed her to let the pharmacy know once approved and take her card there for them to apply to her prescriptions.   [place] copay card:

## 2022-11-08 NOTE — Telephone Encounter (Signed)
Patient calls nurse line requesting to speak with Sonya Walton regarding [place] cost. She reports that medication is over $200 due to not yet meeting the deductible. She reports that she has ~ 3-4 days left of medication.   She is asking for a returned call from [place] [facility] to discuss possible medication assistance options.   Please call patient at [phone].  Talbot Grumbling, RN

## 2022-11-10 MED ORDER — BASAGLAR KWIKPEN 100 UNIT/ML ~~LOC~~ SOPN: 18.0000 [IU] | PEN_INJECTOR | Freq: Every day | SUBCUTANEOUS | 3 refills | Status: DC

## 2022-11-10 NOTE — Addendum Note (Signed)
Addended by: Lona Millard C on: 11/10/2022 11:03 AM   Modules accepted: Orders

## 2022-11-10 NOTE — Addendum Note (Signed)
Addended by: Eustace Quail on: 11/10/2022 04:49 PM   Modules accepted: Orders

## 2022-11-10 NOTE — Telephone Encounter (Signed)
Patient calls nurse line regarding insulin. She states that she has not yet received from pharmacy.   Per chart review, medication was set to "No print"  Medication will need to be resent electronically. Please include dispense quantity and number of refills.   Thanks.   Talbot Grumbling, RN

## 2022-11-25 ENCOUNTER — Ambulatory Visit: Payer: 59 | Admitting: Podiatry

## 2022-12-22 ENCOUNTER — Telehealth: Payer: Self-pay

## 2022-12-22 NOTE — Telephone Encounter (Signed)
Patient calls nurse line requesting an apt.  Patient reports she has a hx of H Pylori infection. She reports she was diagnosed by GI "over 2 years ago." She reports similar symptoms of gas, loose stools and feeling bloated.   She denies any fevers, bloody stools, nausea or vomiting.   Patient scheduled for evaluation tomorrow.   Red flags discussed in the meantime.

## 2022-12-23 ENCOUNTER — Encounter: Payer: Self-pay | Admitting: Student

## 2022-12-23 ENCOUNTER — Ambulatory Visit (INDEPENDENT_AMBULATORY_CARE_PROVIDER_SITE_OTHER): Payer: 59 | Admitting: Student

## 2022-12-23 VITALS — BP 108/62 | HR 80 | Wt 218.0 lb

## 2022-12-23 DIAGNOSIS — E119 Type 2 diabetes mellitus without complications: Secondary | ICD-10-CM | POA: Diagnosis not present

## 2022-12-23 DIAGNOSIS — R1013 Epigastric pain: Secondary | ICD-10-CM

## 2022-12-23 DIAGNOSIS — Z794 Long term (current) use of insulin: Secondary | ICD-10-CM

## 2022-12-23 LAB — POCT GLYCOSYLATED HEMOGLOBIN (HGB A1C): HbA1c, POC (controlled diabetic range): 7.3 % — AB (ref 0.0–7.0)

## 2022-12-23 NOTE — Progress Notes (Signed)
    SUBJECTIVE:   CHIEF COMPLAINT / HPI:   Sonya Walton is a 62 y.o. female  presenting for evaluation of new onset GI symptoms of gas, loose stools, feeling bloated.  Symptoms began Sunday with epigastric pain after drinking coffee.  She reports pain is intermittent and does not seem to notice relationship with food.  She has no changes in bowel frequency, no diarrhea or constipation.  She did note some nausea and bloating.  She denies bloody or black stools.  She began feeling better yesterday afternoon and is felt well today.  Several family members have been sick with a GI bug 2 weeks ago. In 08/2021 she had a workup for RUQ abdominal pain was found to have H. pylori.  She completed 14-day treatment and has been stable since.  T2DM:  Meds: Farxiga 5 mg, insulin glargine 18 units daily Eye Exam: Up-to-date Last A1C: 6.5, 7 months ago  PERTINENT  PMH / PSH: Reviewed  OBJECTIVE:   BP 108/62   Pulse 80   Wt 218 lb (98.9 kg)   SpO2 96%   BMI 38.62 kg/m   Well-appearing, no acute distress Cardio: Regular rate, regular rhythm, no murmurs on exam. Pulm: Clear, no wheezing, no crackles. No increased work of breathing Abdominal: bowel sounds present, soft, non-tender, non-distended Extremities: no peripheral edema    ASSESSMENT/PLAN:   Epigastric abdominal pain: Appears to be resolved.  No concerning features on exam or in history.  Discussed with patient management options.  Shared decision making with patient to be conservative and monitor for reoccurrence.  If the pain comes back will send in prescription for Pepcid to her pharmacy  Type 2 diabetes mellitus without complications ([place]) I7O 7.3. Continue current medicines.  Follow-up with PCP     [name], [place]

## 2022-12-23 NOTE — Patient Instructions (Signed)
It was great to see you today!   Today we addressed: You are stomach pain.  Lets start by treating this conservatively and just watching to see if it comes back.  If you notice you do start having more epigastric pain again please call the office and I will send in some Pepcid to your pharmacy. Otherwise if you start noticing black or bloody stool please call your gastroenterologist and set up an appointment with them or you can see me in the office as well.  You should return to our clinic Return in about 6 months (around 06/23/2023).  Please arrive 15 minutes before your appointment to ensure smooth check in process.    Please call the clinic at [phone] if your symptoms worsen or you have any concerns.  Thank you for allowing me to participate in your care, Dr. [name] [facility]

## 2022-12-23 NOTE — Assessment & Plan Note (Signed)
A1c 7.3. Continue current medicines.  Follow-up with PCP

## 2022-12-28 DIAGNOSIS — E11620 Type 2 diabetes mellitus with diabetic dermatitis: Secondary | ICD-10-CM | POA: Diagnosis not present

## 2022-12-28 DIAGNOSIS — I10 Essential (primary) hypertension: Secondary | ICD-10-CM | POA: Diagnosis not present

## 2022-12-28 DIAGNOSIS — Z85118 Personal history of other malignant neoplasm of bronchus and lung: Secondary | ICD-10-CM | POA: Diagnosis not present

## 2022-12-28 DIAGNOSIS — J309 Allergic rhinitis, unspecified: Secondary | ICD-10-CM | POA: Diagnosis not present

## 2022-12-28 DIAGNOSIS — Z8249 Family history of ischemic heart disease and other diseases of the circulatory system: Secondary | ICD-10-CM | POA: Diagnosis not present

## 2022-12-28 DIAGNOSIS — Z885 Allergy status to narcotic agent status: Secondary | ICD-10-CM | POA: Diagnosis not present

## 2022-12-28 DIAGNOSIS — E785 Hyperlipidemia, unspecified: Secondary | ICD-10-CM | POA: Diagnosis not present

## 2022-12-28 DIAGNOSIS — Z794 Long term (current) use of insulin: Secondary | ICD-10-CM | POA: Diagnosis not present

## 2022-12-28 DIAGNOSIS — Z833 Family history of diabetes mellitus: Secondary | ICD-10-CM | POA: Diagnosis not present

## 2022-12-28 DIAGNOSIS — Z87891 Personal history of nicotine dependence: Secondary | ICD-10-CM | POA: Diagnosis not present

## 2022-12-28 DIAGNOSIS — M1990 Unspecified osteoarthritis, unspecified site: Secondary | ICD-10-CM | POA: Diagnosis not present

## 2023-01-13 ENCOUNTER — Other Ambulatory Visit (HOSPITAL_COMMUNITY): Payer: Self-pay

## 2023-02-03 ENCOUNTER — Encounter: Payer: Self-pay | Admitting: Podiatry

## 2023-02-03 ENCOUNTER — Ambulatory Visit (INDEPENDENT_AMBULATORY_CARE_PROVIDER_SITE_OTHER): Payer: 59 | Admitting: Podiatry

## 2023-02-03 DIAGNOSIS — B351 Tinea unguium: Secondary | ICD-10-CM

## 2023-02-03 DIAGNOSIS — E1142 Type 2 diabetes mellitus with diabetic polyneuropathy: Secondary | ICD-10-CM | POA: Diagnosis not present

## 2023-02-03 DIAGNOSIS — M79675 Pain in left toe(s): Secondary | ICD-10-CM

## 2023-02-03 DIAGNOSIS — M79674 Pain in right toe(s): Secondary | ICD-10-CM

## 2023-02-03 DIAGNOSIS — M2011 Hallux valgus (acquired), right foot: Secondary | ICD-10-CM

## 2023-02-03 DIAGNOSIS — L84 Corns and callosities: Secondary | ICD-10-CM | POA: Diagnosis not present

## 2023-02-03 DIAGNOSIS — E119 Type 2 diabetes mellitus without complications: Secondary | ICD-10-CM

## 2023-02-03 DIAGNOSIS — M2012 Hallux valgus (acquired), left foot: Secondary | ICD-10-CM | POA: Diagnosis not present

## 2023-02-03 DIAGNOSIS — B353 Tinea pedis: Secondary | ICD-10-CM | POA: Diagnosis not present

## 2023-02-03 MED ORDER — CLOTRIMAZOLE 1 % EX CREA: TOPICAL_CREAM | CUTANEOUS | 1 refills | Status: DC

## 2023-02-03 NOTE — Progress Notes (Signed)
ANNUAL DIABETIC FOOT EXAM  Subjective: Sonya Walton presents today for annual diabetic foot examination.  Chief Complaint  Patient presents with   diabetic foot care   Patient confirms h/o diabetes.  Patient relates 3 year h/o diabetes.  Patient denies any h/o foot wounds.  Patient has been diagnosed with neuropathy.  Risk factors: diabetes, HTN, hyperlipidemia.  She states her feet are still scaly/dry despite using Ketoconazole Cream. She uses Aquaphor Lotion at night. Patient also states her great toes continue to be sore due to thickness. She wore shoes to a funeral and they were very painful. She would like to have both toenails removed. Patient states her last A1c was around the 7% range.  Sonya Guiles, Sonya Walton is patient's PCP.  Past Medical History:  Diagnosis Date   Arthritis    Coronary artery disease    mild by CT 02/05/21   Hypertension    states under control with med., has been on med. since 2011   Immature cataract    Osteoarthritis 12/2015   right AC joint   Wears dentures    upper   Patient Active Problem List   Diagnosis Date Noted   Contact dermatitis 08/02/2022   HLD (hyperlipidemia) 05/16/2022   Sebaceous cyst 11/15/2021   Lung cancer 09/14/2021   S/P Robotic Assisted Right VATS with Right Lower Lobectomy, Lymph Node Dissection, Intercostal nerve block 08/14/2021   Pulmonary nodule 1 cm or greater in diameter 12/22/2020   Thyroid nodule incidentally noted on imaging study 10/07/2020   Morbid obesity 02/22/2020   Vaginal yeast infection 07/20/2019   Left breast mass 07/07/2018   Type 2 diabetes mellitus without complications [id number]   Rotator cuff arthropathy 12/30/2015   Partial tear of right rotator cuff 05/02/2015   Hypertension 10/05/2012   History of tobacco use 12/31/2011   Arthritis 12/31/2011   Past Surgical History:  Procedure Laterality Date   BREAST BIOPSY Left    BRONCHIAL BIOPSY  03/09/2021   Procedure: BRONCHIAL BIOPSIES;   Surgeon: Collene Gobble, MD;  Location: [facility] ENDOSCOPY;  Service: Pulmonary;;   BRONCHIAL BRUSHINGS  03/09/2021   Procedure: BRONCHIAL BRUSHINGS;  Surgeon: Collene Gobble, MD;  Location: [facility] ENDOSCOPY;  Service: Pulmonary;;   BRONCHIAL NEEDLE ASPIRATION BIOPSY  03/09/2021   Procedure: BRONCHIAL NEEDLE ASPIRATION BIOPSIES;  Surgeon: Collene Gobble, MD;  Location: [place] ENDOSCOPY;  Service: Pulmonary;;   BUNIONECTOMY Bilateral 1994   CHOLECYSTECTOMY  01/17/2012   Procedure: LAPAROSCOPIC CHOLECYSTECTOMY;  Surgeon: Harl Bowie, MD;  Location: [place];  Service: General;  Laterality: N/A;   FIDUCIAL MARKER PLACEMENT  03/09/2021   Procedure: FIDUCIAL MARKER PLACEMENT;  Surgeon: Collene Gobble, MD;  Location: [facility] ENDOSCOPY;  Service: Pulmonary;;   INTERCOSTAL NERVE BLOCK Right 08/14/2021   Procedure: INTERCOSTAL NERVE BLOCK;  Surgeon: [name], MD;  Location: [facility] OR;  Service: Thoracic;  Laterality: Right;   NODE DISSECTION Right 08/14/2021   Procedure: NODE DISSECTION;  Surgeon: [name], MD;  Location: [facility] OR;  Service: Thoracic;  Laterality: Right;   PUBOVAGINAL SLING  03/05/2003   SHOULDER ARTHROSCOPY WITH DISTAL CLAVICLE RESECTION Right 01/26/2016   Procedure: SHOULDER ARTHROSCOPY WITH DISTAL CLAVICLE RESECTION;  Surgeon: Tania Ade, MD;  Location: [place];  Service: Orthopedics;  Laterality: Right;   SHOULDER ARTHROSCOPY WITH SUBACROMIAL DECOMPRESSION Right 01/26/2016   Procedure: SHOULDER ARTHROSCOPY WITH SUBACROMIAL DECOMPRESSION DEBRIDEMENT;  Surgeon: Tania Ade, MD;  Location: [place];  Service: Orthopedics;  Laterality: Right;  TONSILLECTOMY  1975   TOTAL VAGINAL HYSTERECTOMY  03/05/2003   TUBAL LIGATION  1981   VAGINAL HYSTERECTOMY  2004   partial    VIDEO BRONCHOSCOPY WITH ENDOBRONCHIAL NAVIGATION N/A 03/09/2021   Procedure: VIDEO BRONCHOSCOPY WITH ENDOBRONCHIAL NAVIGATION;  Surgeon: Collene Gobble, MD;  Location: [place] ENDOSCOPY;   Service: Pulmonary;  Laterality: N/A;   Current Outpatient Medications on File Prior to Visit  Medication Sig Dispense Refill   acetaminophen (TYLENOL) 500 MG tablet Take 500 mg by mouth every 6 (six) hours as needed for mild pain or moderate pain.     aspirin EC 81 MG tablet Take 1 tablet (81 mg total) by mouth daily. Swallow whole. 90 tablet 3   atenolol-chlorthalidone (TENORETIC) 50-25 MG tablet Take 1 tablet by mouth daily. 90 tablet 2   blood glucose meter kit and supplies KIT Inject 1 each into the skin as directed. Use meter once daily to measure blood glucose. 1 each 0   cetirizine (ZYRTEC) 10 MG tablet Take 10 mg by mouth daily as needed for allergies.     Cholecalciferol (VITAMIN D3) 2000 UNITS TABS Take 2,000 Units by mouth every morning.     dapagliflozin propanediol (FARXIGA) 5 MG TABS tablet Take 1 tablet by mouth once daily 90 tablet 3   glucose blood ([facility] ADVANCED GLUCOSE TEST) test strip Please specify directions, refills and quantity\ Once daily, [id number], 3 refills, 100 quantity Dr Lattie Haw MD 100 each 0   Insulin Glargine (BASAGLAR KWIKPEN) 100 UNIT/ML Inject 18 Units into the skin daily. 15 mL 3   Insulin Pen Needle (RELION PEN NEEDLES) 32G X 4 MM MISC USE 1 NEEDLE DAILY AS DIRECTED 1000 each 0   ketoconazole (NIZORAL) 2 % cream Apply to both fett and between toes once daily for 6 weeks. 60 g 0   nitroGLYCERIN (NITROSTAT) 0.4 MG SL tablet Place 1 tablet (0.4 mg total) under the tongue every 5 (five) minutes as needed for chest pain. 30 tablet 12   potassium chloride SA (KLOR-CON M) 20 MEQ tablet Take 2 tablets (40 mEq total) by mouth daily. 180 tablet 3   rosuvastatin (CRESTOR) 10 MG tablet Take 1 tablet (10 mg total) by mouth daily. 90 tablet 3   triamcinolone ointment (KENALOG) 0.5 % Apply 1 Application topically 2 (two) times daily. 30 g 0   [DISCONTINUED] pantoprazole (PROTONIX) 40 MG tablet Take 1 tablet (40 mg total) by mouth daily at 12 noon. 30 tablet 0    No current facility-administered medications on file prior to visit.    Allergies  Allergen Reactions   Percocet [Oxycodone-Acetaminophen] Other (See Comments)    Dysphoria   Social History   Occupational History   Not on file  Tobacco Use   Smoking status: Former    Packs/day: 1.00    Years: 37.00    Additional pack years: 0.00    Total pack years: 37.00    Types: Cigarettes    Start date: 11/01/1981    Quit date: 01/09/2018    Years since quitting: 5.0   Smokeless tobacco: Never   Tobacco comments:    1 ppd max use.   Vaping Use   Vaping Use: Never used  Substance and Sexual Activity   Alcohol use: Yes    Alcohol/week: 0.0 standard drinks of alcohol    Comment: rarely   Drug use: No   Sexual activity: Not Currently    Partners: Male    Birth control/protection: Surgical   Family History  Problem Relation Age of Onset   Liver cancer Father    Cancer Father        lung and liver   Heart disease Mother    Hyperlipidemia Mother    Hypertension Mother    Heart failure Mother    Hypertension Other    Diabetes Other    Asthma Other    Diabetes type II Sister    Breast cancer Sister    Asthma Sister    COPD Sister    Diabetes type II Brother    Cancer Paternal Aunt        lung   Hypertension Sister    Hypertension Brother    Diabetes Brother    Asthma Brother    Cancer Other        lung   Colon polyps Neg Hx    Esophageal cancer Neg Hx    Rectal cancer Neg Hx    Stomach cancer Neg Hx    Immunization History  Administered Date(s) Administered   Hepatitis A, Adult 07/29/2021   Hepb-cpg 07/29/2021, 09/01/2021   Influenza,inj,Quad PF,6+ Mos 09/01/2020, 08/21/2021, 08/02/2022   Influenza-Unspecified 08/06/2014, 08/03/2016, 08/06/2017   PFIZER(Purple Top)SARS-COV-2 Vaccination 04/09/2020, 05/02/2020   PNEUMOCOCCAL CONJUGATE-20 11/13/2021   Pneumococcal Polysaccharide-23 10/06/2020   Tdap 02/23/2013     Review of Systems: Negative except as noted in  the HPI.   Objective: There were no vitals filed for this visit.  Sonya Walton is a pleasant 62 y.o. female in NAD. AAO X 3.  Vascular Examination: Capillary refill time to digits immediate b/l. Palpable DP pulse(s) b/l LE. Palpable PT pulse(s) b/l LE. Pedal hair absent. No pain with calf compression b/l. Lower extremity skin temperature gradient within normal limits. No edema noted b/l LE. No ischemia or gangrene noted b/l LE. No cyanosis or clubbing noted b/l LE.  Dermatological Examination: Pedal skin is warm and supple b/l LE. No open wounds b/l LE. No interdigital macerations noted b/l LE. Toenails 2-5 bilaterally elongated, discolored, dystrophic, thickened, and crumbly with subungual debris and tenderness to dorsal palpation. Toenail(s) bilateral great toes elongated, discolored, dystrophic, thickened >1/4 inch. Nails are crumbly with subungual debris and there is exquisite tenderness to dorsal palpation. No subungual wound(s) noted. Hyperkeratotic lesion(s) medial IPJ of right great toe.  No erythema, no edema, no drainage, no fluctuance. Diffuse scaling noted peripherally and plantarly b/l feet.  No interdigital macerations.  No blisters, no weeping. No signs of secondary bacterial infection noted.  Neurological Examination: Protective sensation intact 5/5 intact bilaterally with 10g monofilament b/l. Vibratory sensation intact b/l. Proprioception intact bilaterally.  Musculoskeletal Examination: HAV with bunion deformity noted b/l LE.  Footwear Assessment: Does the patient wear appropriate shoes? Yes. Does the patient need inserts/orthotics? No.  Lab Results  Component Value Date   HGBA1C 7.3 (A) 12/23/2022   ADA Risk Categorization: Low Risk :  Patient has all of the following: Intact protective sensation No prior foot ulcer  No severe deformity Pedal pulses present  Assessment: 1. Pain due to onychomycosis of toenails of both feet   2. Tinea pedis of both feet    3. Callus   4. Hallux valgus, acquired, bilateral   5. Diabetic peripheral neuropathy associated with type 2 diabetes mellitus   6. Encounter for diabetic foot exam     Plan: Meds ordered this encounter  Medications   clotrimazole (LOTRIMIN) 1 % cream    Sig: Apply to both feet and between toes twice daily for 6 weeks.  Dispense:  60 g    Refill:  1   -Patient was evaluated and treated. All patient's and/or POA's questions/concerns answered on today's visit. -Discontinue Ketoconazole Cream. -Diabetic foot examination performed today. -Continue diabetic foot care principles: inspect feet daily, monitor glucose as recommended by PCP and/or Endocrinologist, and follow prescribed diet per PCP, Endocrinologist and/or dietician. -Toenails 1-5 b/l were debrided in length and girth with sterile nail nippers and dremel without iatrogenic bleeding.  -Discussed treatment for onychomycosis: temporary vs chemical matrixectomy toenails of bilateral great toes. Patient will schedule with Dr. Loel Lofty at her convenience. -Callus(es) right great toe pared utilizing sterile scalpel blade without complication or incident. Total number debrided =1. -For tinea pedis, Rx sent to pharmacy for Clotrimazole Cream 1% to be applied twice daily for six weeks. -Patient/POA to call should there be question/concern in the interim. Return in about 3 months (around 05/05/2023).  Sonya Walton, DPM

## 2023-02-04 ENCOUNTER — Ambulatory Visit (INDEPENDENT_AMBULATORY_CARE_PROVIDER_SITE_OTHER): Payer: 59 | Admitting: Podiatry

## 2023-02-04 DIAGNOSIS — L600 Ingrowing nail: Secondary | ICD-10-CM | POA: Diagnosis not present

## 2023-02-04 NOTE — Patient Instructions (Signed)

## 2023-02-04 NOTE — Progress Notes (Signed)
  Subjective:  Patient ID: Sonya Walton, female    DOB: 1961-01-26,  MRN: [id number]  Chief Complaint  Patient presents with   Foot Problem    Patient is present for removal of great hallux on bilateral feet, nails are growing thick and patient would like them removed     62 y.o. female presents for pain in the bilateral hallux nail.  They are growing very thick and abnormally.  She is concerned about ingrown's.  She has previously seen Dr. Donzetta Matters for nail trims who referred her to me for bilateral hallux nail avulsion versus chemical matrixectomy.  She is hoping to have them permanently removed as she does not want them to grow back at the chance of having pain or thickness  Past Medical History:  Diagnosis Date   Arthritis    Coronary artery disease    mild by CT 02/05/21   Hypertension    states under control with med., has been on med. since 2011   Immature cataract    Osteoarthritis 12/2015   right AC joint   Wears dentures    upper    Allergies  Allergen Reactions   Percocet [Oxycodone-Acetaminophen] Other (See Comments)    Dysphoria    ROS: Negative except as per HPI above  Objective:  General: AAO x3, NAD  Dermatological: Significant fungal dystrophy with thickening dystrophic growth subungual debris and onycholysis with pain with palpation of bilateral hallux nails.  Vascular:  Dorsalis Pedis artery and Posterior Tibial artery pedal pulses are 2/4 bilateral.  Capillary fill time < 3 sec to all digits.   Neruologic: Grossly intact via light touch bilateral. Protective threshold intact to all sites bilateral.   Musculoskeletal: No gross boney pedal deformities bilateral. No pain, crepitus, or limitation noted with foot and ankle range of motion bilateral. Muscular strength 5/5 in all groups tested bilateral.  Gait: Unassisted, Nonantalgic.   No images are attached to the encounter.   Assessment:   1. Ingrown nail of great toe of right foot   2. Ingrown  nail of great toe of left foot      Plan:  Patient was evaluated and treated and all questions answered.    Ingrown Nail, bilaterally -Patient elects to proceed with minor surgery to remove ingrown toenail today. Consent reviewed and signed by patient. -Ingrown nail excised. See procedure note. -Educated on post-procedure care including soaking. Written instructions provided and reviewed. -Patient to follow up in 2 weeks for nail check.  Procedure: Excision of Ingrown Toenail Location: Bilateral 1st toe total nail  Anesthesia: Lidocaine 1% plain; 1.5 mL and Marcaine 0.5% plain; 1.5 mL, digital block. Skin Prep: Betadine. Dressing: Silvadene; telfa; dry, sterile, compression dressing. Technique: Following skin prep, the toe was exsanguinated and a tourniquet was secured at the base of the toe. The affected nail border was freed, split with a nail splitter, and excised. Chemical matrixectomy was then performed with phenol and irrigated out with alcohol. The tourniquet was then removed and sterile dressing applied. Disposition: Patient tolerated procedure well. Patient to return in 2 weeks for follow-up.    Return in about 2 weeks (around 02/18/2023) for f/u bilaterl hallux nail removal.          Corinna Gab, DPM [facility] / [facility]

## 2023-02-04 NOTE — Progress Notes (Signed)
  Subjective:  Patient ID: Sonya Walton, female    DOB: 03/16/1961,  MRN: [id number]  Chief Complaint  Patient presents with   Foot Problem    Patient is present for removal of great hallux on bilateral feet, nails are growing thick and patient would like them removed     62 y.o. female presents for pain in the bilateral hallux nail.  They are growing very thick and abnormally.  She is concerned about ingrown's.  She has previously seen Dr. Galloway for nail trims who referred her to me for bilateral hallux nail avulsion versus chemical matrixectomy.  She is hoping to have them permanently removed as she does not want them to grow back at the chance of having pain or thickness  Past Medical History:  Diagnosis Date   Arthritis    Coronary artery disease    mild by CT 02/05/21   Hypertension    states under control with med., has been on med. since 2011   Immature cataract    Osteoarthritis 12/2015   right AC joint   Wears dentures    upper    Allergies  Allergen Reactions   Percocet [Oxycodone-Acetaminophen] Other (See Comments)    Dysphoria    ROS: Negative except as per HPI above  Objective:  General: AAO x3, NAD  Dermatological: Significant fungal dystrophy with thickening dystrophic growth subungual debris and onycholysis with pain with palpation of bilateral hallux nails.  Vascular:  Dorsalis Pedis artery and Posterior Tibial artery pedal pulses are 2/4 bilateral.  Capillary fill time < 3 sec to all digits.   Neruologic: Grossly intact via light touch bilateral. Protective threshold intact to all sites bilateral.   Musculoskeletal: No gross boney pedal deformities bilateral. No pain, crepitus, or limitation noted with foot and ankle range of motion bilateral. Muscular strength 5/5 in all groups tested bilateral.  Gait: Unassisted, Nonantalgic.   No images are attached to the encounter.   Assessment:   1. Ingrown nail of great toe of right foot   2. Ingrown  nail of great toe of left foot      Plan:  Patient was evaluated and treated and all questions answered.    Ingrown Nail, bilaterally -Patient elects to proceed with minor surgery to remove ingrown toenail today. Consent reviewed and signed by patient. -Ingrown nail excised. See procedure note. -Educated on post-procedure care including soaking. Written instructions provided and reviewed. -Patient to follow up in 2 weeks for nail check.  Procedure: Excision of Ingrown Toenail Location: Bilateral 1st toe total nail  Anesthesia: Lidocaine 1% plain; 1.5 mL and Marcaine 0.5% plain; 1.5 mL, digital block. Skin Prep: Betadine. Dressing: Silvadene; telfa; dry, sterile, compression dressing. Technique: Following skin prep, the toe was exsanguinated and a tourniquet was secured at the base of the toe. The affected nail border was freed, split with a nail splitter, and excised. Chemical matrixectomy was then performed with phenol and irrigated out with alcohol. The tourniquet was then removed and sterile dressing applied. Disposition: Patient tolerated procedure well. Patient to return in 2 weeks for follow-up.    Return in about 2 weeks (around 02/18/2023) for f/u bilaterl hallux nail removal.          Alex F Zariyah Stephens, DPM [facility] / [facility]                      

## 2023-02-11 ENCOUNTER — Ambulatory Visit (INDEPENDENT_AMBULATORY_CARE_PROVIDER_SITE_OTHER): Payer: 59 | Admitting: Podiatry

## 2023-02-11 ENCOUNTER — Telehealth: Payer: Self-pay

## 2023-02-11 DIAGNOSIS — B353 Tinea pedis: Secondary | ICD-10-CM | POA: Diagnosis not present

## 2023-02-11 DIAGNOSIS — L259 Unspecified contact dermatitis, unspecified cause: Secondary | ICD-10-CM | POA: Diagnosis not present

## 2023-02-11 MED ORDER — KETOCONAZOLE 2 % EX CREA: 1.0000 | TOPICAL_CREAM | Freq: Every day | CUTANEOUS | 0 refills | Status: DC

## 2023-02-11 NOTE — Progress Notes (Unsigned)
  Subjective:  Patient ID: Sonya Walton, female    DOB: Mar 20, 1961,  MRN: [id number]  Chief Complaint  Patient presents with   Foot Problem    Rash on bilateral feet at the top of the foot, patient thinks rash couldve come from the new cream per dr Wallis Bamberg or sulfur that was used for ingrown procedure     62 y.o. female presents with concern for rash on the top of both feet.  She is concerned that the rash may be from Silvadene cream or new steroid cream that was given to her by Dr. Donzetta Matters.  She was given triamcinolone ointment and she was putting that on there and then had developed a red spotty rash on the top of her foot.  She also has some dry flaking skin and more diffuse rash on the bottom of both feet.  Has previously had and been treated for athlete's foot.  Past Medical History:  Diagnosis Date   Arthritis    Coronary artery disease    mild by CT 02/05/21   Hypertension    states under control with med., has been on med. since 2011   Immature cataract    Osteoarthritis 12/2015   right AC joint   Wears dentures    upper    Allergies  Allergen Reactions   Percocet [Oxycodone-Acetaminophen] Other (See Comments)    Dysphoria    ROS: Negative except as per HPI above  Objective:  General: AAO x3, NAD  Dermatological: On the aspect of both feet left worse than right there is noted to be spotty red rash consistent with possible eczema reaction versus allergic contact dermatitis.  On the bottom of both feet there is dry flaking skin xerosis and red rash in moccasin distribution consistent with tinea pedis  Vascular:  Dorsalis Pedis artery and Posterior Tibial artery pedal pulses are 2/4 bilateral.  Capillary fill time < 3 sec to all digits.   Neruologic: Grossly intact via light touch bilateral. Protective threshold intact to all sites bilateral.   Musculoskeletal: No gross boney pedal deformities bilateral. No pain, crepitus, or limitation noted with foot and ankle  range of motion bilateral. Muscular strength 5/5 in all groups tested bilateral.  Gait: Unassisted, Nonantalgic.   No images are attached to the encounter.  Assessment:   1. Tinea pedis of both feet   2. Contact dermatitis, unspecified contact dermatitis type, unspecified trigger      Plan:  Patient was evaluated and treated and all questions answered.  # Allergic contact dermatitis on the dorsal aspect of the foot -Discussed with the patient that she does have evidence of contact dermatitis possibly from Silvadene cream versus steroid ointment that was prescribed -Recommend discontinue use of both of these products. -Continue Epsom salt soaks for ingrown nail as she is continue to recover from that  # Athlete's foot bilateral foot -Recommend use of ketoconazole 2% ointment applied daily to the bottom of both feet           Corinna Gab, DPM [facility] / [facility]

## 2023-02-11 NOTE — Telephone Encounter (Signed)
Patient is aware 

## 2023-02-14 NOTE — Progress Notes (Unsigned)
    SUBJECTIVE:   CHIEF COMPLAINT / HPI:   Knot on back of neck  PERTINENT  PMH / PSH: ***  OBJECTIVE:   There were no vitals taken for this visit. ***  General: NAD, pleasant, able to participate in exam Cardiac: RRR, no murmurs. Respiratory: CTAB, normal effort, No wheezes, rales or rhonchi Abdomen: Bowel sounds present, nontender, nondistended Extremities: no edema or cyanosis. Skin: warm and dry, no rashes noted Neuro: alert, no obvious focal deficits Psych: Normal affect and mood  ASSESSMENT/PLAN:   No problem-specific Assessment & Plan notes found for this encounter.     Dr. Elberta Fortis, DO [facility]    {    This will disappear when note is signed, click to select method of visit    :1}

## 2023-02-15 ENCOUNTER — Other Ambulatory Visit (HOSPITAL_COMMUNITY): Payer: Self-pay

## 2023-02-15 ENCOUNTER — Ambulatory Visit (INDEPENDENT_AMBULATORY_CARE_PROVIDER_SITE_OTHER): Payer: 59 | Admitting: Family Medicine

## 2023-02-15 ENCOUNTER — Telehealth: Payer: Self-pay

## 2023-02-15 ENCOUNTER — Other Ambulatory Visit: Payer: Self-pay

## 2023-02-15 ENCOUNTER — Encounter: Payer: Self-pay | Admitting: Family Medicine

## 2023-02-15 VITALS — BP 123/69 | HR 72 | Ht 63.0 in | Wt 217.6 lb

## 2023-02-15 DIAGNOSIS — R221 Localized swelling, mass and lump, neck: Secondary | ICD-10-CM

## 2023-02-15 DIAGNOSIS — M7989 Other specified soft tissue disorders: Secondary | ICD-10-CM

## 2023-02-15 DIAGNOSIS — E041 Nontoxic single thyroid nodule: Secondary | ICD-10-CM | POA: Diagnosis not present

## 2023-02-15 NOTE — Telephone Encounter (Signed)
Scheduled U/S on 02/22/23 at 10am with Redge Gainer, pt is informed, gave her the number incase of need to reschedule. Sonya Walton

## 2023-02-15 NOTE — Assessment & Plan Note (Addendum)
>>  ASSESSMENT AND PLAN FOR THYROID NODULE INCIDENTALLY NOTED ON IMAGING STUDY WRITTEN ON 02/15/2023 12:07 PM BY Elberta Fortis, MD  Incidentally found 1.1cm thyroid nodule, due for [place] in 2023 but lost to follow up.  -US thyroid to assess for change  >>ASSESSMENT AND PLAN FOR NECK NODULE WRITTEN ON 02/15/2023 12:09 PM BY Tally Mckinnon, MD  Discrete 2 cm x 1 cm oblong nodule on L posterior neck. TTP but non-pruritic. Non-erythematous, non-draining. H/o lung cancer diagnosed via LN biopsy and due for surveillance CT chest 03/14/2023. -Soft tissue US of L posterior neck -Consider adding on CT neck to CT chest neck month if concerning features

## 2023-02-15 NOTE — Assessment & Plan Note (Signed)
Discrete 2 cm x 1 cm oblong nodule on L posterior neck. TTP but non-pruritic. Non-erythematous, non-draining. H/o lung cancer diagnosed via LN biopsy and due for surveillance CT chest 03/14/2023. -Soft tissue US of L posterior neck -Consider adding on CT neck to CT chest neck month if concerning features

## 2023-02-15 NOTE — Patient Instructions (Signed)
It was wonderful to see you today! Thank you for choosing [facility].   Please bring ALL of your medications with you to every visit.   Today we talked about:  We are scheduling an ultrasound of the nodule to assess it further. We will also try to do the ultrasound of your thyroid at the same time. We will call you with that appointment time.  Please follow up pending ultrasound results  Call the clinic at [phone] if your symptoms worsen or you have any concerns.  Please be sure to schedule follow up at the front desk before you leave today.   Elberta Fortis, DO Family Medicine

## 2023-02-16 ENCOUNTER — Telehealth: Payer: Self-pay

## 2023-02-16 ENCOUNTER — Other Ambulatory Visit (HOSPITAL_COMMUNITY): Payer: Self-pay

## 2023-02-16 NOTE — Telephone Encounter (Signed)
PT requesting help regarding Marcelline Deist copay at pharmacy.   Spoke with patient after running some test claims on insurance. Informed patient her current copay for a 30 day supply of Marcelline Deist is $555. The copay card only covers up to $175 per 30 day fill. Patient has a remaining insurance deductible of [phone].  Pt let me know she has medicaid as well. When attempting to run medicaid secondary, claim was denied due to medication needing a PA. Let pt know I will initiate a prior auth under the medicaid to see if it'll get covered.   Will reach back out to patient once insurance sends a decision.

## 2023-02-16 NOTE — Telephone Encounter (Signed)
A Prior Authorization was initiated for this patients FARXIGA through CoverMyMeds.   Key: [id number]

## 2023-02-18 ENCOUNTER — Ambulatory Visit (INDEPENDENT_AMBULATORY_CARE_PROVIDER_SITE_OTHER): Payer: 59 | Admitting: Podiatry

## 2023-02-18 ENCOUNTER — Other Ambulatory Visit (HOSPITAL_COMMUNITY): Payer: Self-pay

## 2023-02-18 DIAGNOSIS — L600 Ingrowing nail: Secondary | ICD-10-CM

## 2023-02-18 NOTE — Progress Notes (Signed)
Subjective: Sonya Walton is a 62 y.o.  female returns to office today for follow up evaluation after having bilateral Hallux total nail ingrown removal with phenol and alcohol matrixectomy approximately 2 weeks ago. Patient has been soaking using epsom salts and applying topical antibiotic covered with bandaid daily. Patient denies fevers, chills, nausea, vomiting. Denies any calf pain, chest pain, SOB.   Objective:  Vitals: Reviewed  General: Well developed, nourished, in no acute distress, alert and oriented x3   Dermatology: Skin is warm, dry and supple bilateral. bilateral hallux nail bed  appears to be clean, dry, with mild granular tissue and surrounding scab. There is no surrounding erythema, edema, drainage/purulence. The remaining nails appear unremarkable at this time. There are no other lesions or other signs of infection present.  Neurovascular status: Intact. No lower extremity swelling; No pain with calf compression bilateral.  Musculoskeletal: Decreased tenderness to palpation of the bilateral hallux nail bed. Muscular strength within normal limits bilateral.   Assesement and Plan: S/p phenol and alcohol matrixectomy to the  bilateral hallux nail total, doing well.   -Continue soaking in epsom salts twice a day followed by antibiotic ointment and a band-aid. Can leave uncovered at night. Continue this until completely healed.  -If the area has not healed in 2 weeks, call the office for follow-up appointment, or sooner if any problems arise.  -Monitor for any signs/symptoms of infection. Call the office immediately if any occur or go directly to the emergency room. Call with any questions/concerns.        Corinna Gab, DPM [facility] / [facility]                   02/18/2023

## 2023-02-21 ENCOUNTER — Other Ambulatory Visit (HOSPITAL_COMMUNITY): Payer: Self-pay

## 2023-02-21 ENCOUNTER — Other Ambulatory Visit: Payer: Self-pay | Admitting: Student

## 2023-02-21 DIAGNOSIS — Z1231 Encounter for screening mammogram for malignant neoplasm of breast: Secondary | ICD-10-CM

## 2023-02-21 NOTE — Telephone Encounter (Signed)
Medication approved under Facilities manager and requires PA under medicaid.   Medicaid has deemed the PA as N/A. Pharmacy is unable to submit the correct "billing code" for medicaid to see the primary coverage paying for the medication. After multiple attempts, patient agreed to have pharmacy fill medication as generic with a copay of $25.

## 2023-02-22 ENCOUNTER — Ambulatory Visit (HOSPITAL_COMMUNITY)
Admission: RE | Admit: 2023-02-22 | Discharge: 2023-02-22 | Disposition: A | Discharge status: Home / Self Care | Payer: 59 | Source: Ambulatory Visit | Attending: Family Medicine | Admitting: Family Medicine

## 2023-02-22 DIAGNOSIS — M7989 Other specified soft tissue disorders: Secondary | ICD-10-CM | POA: Insufficient documentation

## 2023-02-22 DIAGNOSIS — E041 Nontoxic single thyroid nodule: Secondary | ICD-10-CM | POA: Diagnosis not present

## 2023-02-24 ENCOUNTER — Telehealth: Payer: Self-pay

## 2023-02-24 DIAGNOSIS — E041 Nontoxic single thyroid nodule: Secondary | ICD-10-CM

## 2023-02-24 NOTE — Telephone Encounter (Signed)
Patient calls nurse line requesting results from Thyroid US.   She reports she viewed the image in [place], however reports she does not understand the interpretation.   Will forward to provider who saw patient.

## 2023-02-24 NOTE — Telephone Encounter (Signed)
Discussed thyroid ultrasound results with patient. US showed two thyroid nodules that meet criteria for FNA and one thyroid nodule that grew in size. Need updated TSH, normal in 11/2021. Ordered TSH lab to be drawn in [place] closer to patient's home. Referred to general surgery for possible FNA of nodules. Patient in agreement with plan.  Elberta Fortis, DO

## 2023-02-28 ENCOUNTER — Other Ambulatory Visit: Payer: Self-pay

## 2023-02-28 DIAGNOSIS — R911 Solitary pulmonary nodule: Secondary | ICD-10-CM | POA: Diagnosis not present

## 2023-02-28 NOTE — Addendum Note (Signed)
Addended by: Elberta Fortis on: 02/28/2023 01:56 PM   Modules accepted: Orders

## 2023-02-28 NOTE — Telephone Encounter (Signed)
Discussion with referral coordinator, do not need General Surgery referral for FNA. Ordered FNA for thyroid nodules #5 and #6 for further evaluation.

## 2023-02-28 NOTE — Addendum Note (Signed)
Addended by: Elberta Fortis on: 02/28/2023 01:53 PM   Modules accepted: Orders

## 2023-03-01 ENCOUNTER — Telehealth: Payer: Self-pay | Admitting: Family Medicine

## 2023-03-01 DIAGNOSIS — R221 Localized swelling, mass and lump, neck: Secondary | ICD-10-CM

## 2023-03-01 MED ORDER — POTASSIUM CHLORIDE CRYS ER 20 MEQ PO TBCR: 40.0000 meq | EXTENDED_RELEASE_TABLET | Freq: Every day | ORAL | 3 refills | Status: DC

## 2023-03-01 NOTE — Telephone Encounter (Signed)
Attempted to call patient x 2, unable to leave voicemail. F/u on soft tissue US of posterior neck nodule. [place] recommended further imaging with CT neck w contrast if painful lesion. Will pursue additional imagining given painful upon exam and recent h/o lung cancer.  Spoke with [facility] Radiology department that recommended adding on CT neck to scheduled CT chest on 5/13 for lung cancer surveillance to further elucidate soft tissue mass in posterior neck. Call [facility] scheduling, images to be taken at that same time.  Elberta Fortis, DO

## 2023-03-01 NOTE — Telephone Encounter (Signed)
Called to schedule pt for FNA of nodule but Pt had already made the appointment for 03/19/23 with [facility]. Penni Bombard CMA

## 2023-03-01 NOTE — Telephone Encounter (Signed)
Patient returns call to nurse line.   She is reports she missed several phone calls. She apologizes for this.   Patient reports you can call her anytime, however if she does not answer you may leave a VM.   Will forward to provider.

## 2023-03-01 NOTE — Telephone Encounter (Signed)
Spoke with patient about soft tissue US results. Reviewed plan to have CT neck done at the same time as CT chest on 5/13.   FNA of thyroid nodules scheduled. TSH ordered but patient has not completed yet. Will get lab work done as soon as able.  Elberta Fortis, DO

## 2023-03-04 ENCOUNTER — Telehealth: Payer: Self-pay

## 2023-03-04 ENCOUNTER — Other Ambulatory Visit: Payer: Self-pay | Admitting: Family Medicine

## 2023-03-04 DIAGNOSIS — E041 Nontoxic single thyroid nodule: Secondary | ICD-10-CM | POA: Diagnosis not present

## 2023-03-04 NOTE — Telephone Encounter (Signed)
Victorino Dike with [facility] calls nurse line requesting order for TSH.   She reports patient is there now, however they are unable to pull the order.   TSH is in epic as future.   Ordered printed and faxed to [facility] at [phone].

## 2023-03-05 LAB — SPECIMEN STATUS REPORT

## 2023-03-07 ENCOUNTER — Telehealth: Payer: Self-pay

## 2023-03-07 DIAGNOSIS — E041 Nontoxic single thyroid nodule: Secondary | ICD-10-CM

## 2023-03-07 NOTE — Telephone Encounter (Signed)
Reviewed Dr. Hardie Shackleton note from 02/15/23. According to that visit patient was going to be scheduled for an thyroid U/S to assess for changes of nodule found in 2023. Also needs assessment of soft tissue of L posterior neck due to nodule on exam.   Will place order for Thyroid U/S.   [name], DO [facility], PGY-1 03/07/23 1:37 PM

## 2023-03-07 NOTE — Telephone Encounter (Signed)
Just got off the phone with [facility] imaging, they need the order put in and sent to them so they can call the patient to schedule images. Please put the order in if needed, I can fax the order to them. Thank you Penni Bombard CMA

## 2023-03-08 LAB — T4 AND TSH
T4, Total: 6.9 ug/dL (ref 4.5–12.0)
TSH: 1.27 u[IU]/mL (ref 0.450–4.500)

## 2023-03-14 ENCOUNTER — Ambulatory Visit (HOSPITAL_COMMUNITY)
Admission: RE | Admit: 2023-03-14 | Discharge: 2023-03-14 | Disposition: A | Discharge status: Home / Self Care | Payer: 59 | Source: Ambulatory Visit | Attending: Internal Medicine | Admitting: Internal Medicine

## 2023-03-14 ENCOUNTER — Encounter (HOSPITAL_COMMUNITY): Payer: Self-pay

## 2023-03-14 ENCOUNTER — Inpatient Hospital Stay: Payer: 59 | Attending: Internal Medicine

## 2023-03-14 ENCOUNTER — Other Ambulatory Visit: Payer: Self-pay

## 2023-03-14 DIAGNOSIS — R222 Localized swelling, mass and lump, trunk: Secondary | ICD-10-CM | POA: Diagnosis not present

## 2023-03-14 DIAGNOSIS — C3490 Malignant neoplasm of unspecified part of unspecified bronchus or lung: Secondary | ICD-10-CM | POA: Insufficient documentation

## 2023-03-14 DIAGNOSIS — R221 Localized swelling, mass and lump, neck: Secondary | ICD-10-CM | POA: Insufficient documentation

## 2023-03-14 DIAGNOSIS — K7460 Unspecified cirrhosis of liver: Secondary | ICD-10-CM | POA: Diagnosis not present

## 2023-03-14 DIAGNOSIS — R911 Solitary pulmonary nodule: Secondary | ICD-10-CM | POA: Diagnosis not present

## 2023-03-14 DIAGNOSIS — I2510 Atherosclerotic heart disease of native coronary artery without angina pectoris: Secondary | ICD-10-CM | POA: Diagnosis not present

## 2023-03-14 DIAGNOSIS — Z902 Acquired absence of lung [part of]: Secondary | ICD-10-CM | POA: Diagnosis not present

## 2023-03-14 DIAGNOSIS — E049 Nontoxic goiter, unspecified: Secondary | ICD-10-CM | POA: Diagnosis not present

## 2023-03-14 DIAGNOSIS — C3411 Malignant neoplasm of upper lobe, right bronchus or lung: Secondary | ICD-10-CM | POA: Diagnosis not present

## 2023-03-14 DIAGNOSIS — M479 Spondylosis, unspecified: Secondary | ICD-10-CM | POA: Insufficient documentation

## 2023-03-14 DIAGNOSIS — Z9049 Acquired absence of other specified parts of digestive tract: Secondary | ICD-10-CM | POA: Diagnosis not present

## 2023-03-14 DIAGNOSIS — I700 Atherosclerosis of aorta: Secondary | ICD-10-CM | POA: Insufficient documentation

## 2023-03-14 LAB — CMP (CANCER CENTER ONLY)
ALT: 39 U/L (ref 0–44)
AST: 34 U/L (ref 15–41)
Albumin: 4.1 g/dL (ref 3.5–5.0)
Alkaline Phosphatase: 76 U/L (ref 38–126)
Anion gap: 7 (ref 5–15)
BUN: 12 mg/dL (ref 8–23)
CO2: 29 mmol/L (ref 22–32)
Calcium: 9.4 mg/dL (ref 8.9–10.3)
Chloride: 105 mmol/L (ref 98–111)
Creatinine: 0.63 mg/dL (ref 0.44–1.00)
GFR, Estimated: >60 mL/min (ref >60)
Glucose, Bld: 124 mg/dL — ABNORMAL HIGH (ref 70–99)
Potassium: 3.6 mmol/L (ref 3.5–5.1)
Sodium: 141 mmol/L (ref 135–145)
Total Bilirubin: 0.7 mg/dL (ref 0.3–1.2)
Total Protein: 7.4 g/dL (ref 6.5–8.1)

## 2023-03-14 LAB — CBC WITH DIFFERENTIAL (CANCER CENTER ONLY)
Abs Immature Granulocytes: 0.01 10*3/uL (ref 0.00–0.07)
Basophils Absolute: 0.1 10*3/uL (ref 0.0–0.1)
Basophils Relative: 1 %
Eosinophils Absolute: 0.3 10*3/uL (ref 0.0–0.5)
Eosinophils Relative: 4 %
HCT: 48.7 % — ABNORMAL HIGH (ref 36.0–46.0)
Hemoglobin: 16.4 g/dL — ABNORMAL HIGH (ref 12.0–15.0)
Immature Granulocytes: 0 %
Lymphocytes Relative: 41 %
Lymphs Abs: 3.2 10*3/uL (ref 0.7–4.0)
MCH: 28.8 pg (ref 26.0–34.0)
MCHC: 33.7 g/dL (ref 30.0–36.0)
MCV: 85.4 fL (ref 80.0–100.0)
Monocytes Absolute: 0.7 10*3/uL (ref 0.1–1.0)
Monocytes Relative: 9 %
Neutro Abs: 3.6 10*3/uL (ref 1.7–7.7)
Neutrophils Relative %: 45 %
Platelet Count: 210 10*3/uL (ref 150–400)
RBC: 5.7 MIL/uL — ABNORMAL HIGH (ref 3.87–5.11)
RDW: 15.6 % — ABNORMAL HIGH (ref 11.5–15.5)
WBC Count: 7.8 10*3/uL (ref 4.0–10.5)
nRBC: 0 % (ref 0.0–0.2)

## 2023-03-14 MED ORDER — SODIUM CHLORIDE (PF) 0.9 % IJ SOLN
INTRAMUSCULAR | Status: AC
  Filled 2023-03-14: qty 50

## 2023-03-14 MED ORDER — IOHEXOL 300 MG/ML  SOLN
75.0000 mL | Freq: Once | INTRAMUSCULAR | Status: AC | PRN
  Administered 2023-03-14: 75 mL via INTRAVENOUS

## 2023-03-16 ENCOUNTER — Inpatient Hospital Stay (HOSPITAL_BASED_OUTPATIENT_CLINIC_OR_DEPARTMENT_OTHER): Payer: 59 | Admitting: Internal Medicine

## 2023-03-16 ENCOUNTER — Other Ambulatory Visit: Payer: Self-pay

## 2023-03-16 VITALS — BP 137/83 | HR 74 | Temp 98.1°F | Resp 18 | Wt 218.7 lb

## 2023-03-16 DIAGNOSIS — M479 Spondylosis, unspecified: Secondary | ICD-10-CM | POA: Diagnosis not present

## 2023-03-16 DIAGNOSIS — C3490 Malignant neoplasm of unspecified part of unspecified bronchus or lung: Secondary | ICD-10-CM

## 2023-03-16 DIAGNOSIS — E049 Nontoxic goiter, unspecified: Secondary | ICD-10-CM | POA: Diagnosis not present

## 2023-03-16 DIAGNOSIS — I2510 Atherosclerotic heart disease of native coronary artery without angina pectoris: Secondary | ICD-10-CM | POA: Diagnosis not present

## 2023-03-16 DIAGNOSIS — I700 Atherosclerosis of aorta: Secondary | ICD-10-CM | POA: Diagnosis not present

## 2023-03-16 DIAGNOSIS — R222 Localized swelling, mass and lump, trunk: Secondary | ICD-10-CM | POA: Diagnosis not present

## 2023-03-16 DIAGNOSIS — K7460 Unspecified cirrhosis of liver: Secondary | ICD-10-CM | POA: Diagnosis not present

## 2023-03-16 DIAGNOSIS — C3411 Malignant neoplasm of upper lobe, right bronchus or lung: Secondary | ICD-10-CM | POA: Diagnosis not present

## 2023-03-16 DIAGNOSIS — Z902 Acquired absence of lung [part of]: Secondary | ICD-10-CM | POA: Diagnosis not present

## 2023-03-16 DIAGNOSIS — Z9049 Acquired absence of other specified parts of digestive tract: Secondary | ICD-10-CM | POA: Diagnosis not present

## 2023-03-16 NOTE — Progress Notes (Signed)
[name] [phone]   [phone]  OFFICE PROGRESS NOTE  Sonya Chard, DO [address] [place] [address]  DIAGNOSIS:  Stage Ia (T1b, N0, M0) non-small cell lung cancer, adenocarcinoma presented with right upper lobe lung nodule diagnosed in October 2022.  PRIOR THERAPY: Status post  right upper lobectomy and lymph node dissection under the care of Dr. Dorris Fetch on 08/14/2021.  CURRENT THERAPY: Observation.  INTERVAL HISTORY: Sonya Walton 63 y.o. female returns to the clinic today for 6 months follow-up visit.  The patient is feeling fine today with no concerning complaints.  She denied having any current chest pain, shortness of breath, cough or hemoptysis.  She has no nausea, vomiting, diarrhea or constipation.  She has no headache or visual changes.  She was found to have a lump on the back of her neck and she had ultrasound performed that was nonspecific.  Her primary care physician added CT of the neck with contrast to her current restaging scan of the chest which was performed few days ago but the final report is still pending.  The patient is here today for evaluation and recommendation regarding her condition.  MEDICAL HISTORY: Past Medical History:  Diagnosis Date   Arthritis    Coronary artery disease    mild by CT 02/05/21   Hypertension    states under control with med., has been on med. since 2011   Immature cataract    Osteoarthritis 12/2015   right AC joint   Wears dentures    upper    ALLERGIES:  is allergic to percocet [oxycodone-acetaminophen] and clotrimazole.  MEDICATIONS:  Current Outpatient Medications  Medication Sig Dispense Refill   acetaminophen (TYLENOL) 500 MG tablet Take 500 mg by mouth every 6 (six) hours as needed for mild pain or moderate pain.     aspirin EC 81 MG tablet Take 1 tablet (81 mg total) by mouth daily. Swallow whole. 90 tablet 3   atenolol-chlorthalidone (TENORETIC) 50-25 MG  tablet Take 1 tablet by mouth daily. 90 tablet 2   blood glucose meter kit and supplies KIT Inject 1 each into the skin as directed. Use meter once daily to measure blood glucose. 1 each 0   cetirizine (ZYRTEC) 10 MG tablet Take 10 mg by mouth daily as needed for allergies.     Cholecalciferol (VITAMIN D3) 2000 UNITS TABS Take 2,000 Units by mouth every morning.     clotrimazole (LOTRIMIN) 1 % cream Apply to both feet and between toes twice daily for 6 weeks. 60 g 1   dapagliflozin propanediol (FARXIGA) 5 MG TABS tablet Take 1 tablet by mouth once daily 90 tablet 3   glucose blood ([employer] ADVANCED GLUCOSE TEST) test strip Please specify directions, refills and quantity\ Once daily, [id number], 3 refills, 100 quantity Dr Towanda Octave MD 100 each 0   Insulin Glargine (BASAGLAR KWIKPEN) 100 UNIT/ML Inject 18 Units into the skin daily. 15 mL 3   Insulin Pen Needle (RELION PEN NEEDLES) 32G X 4 MM MISC USE 1 NEEDLE DAILY AS DIRECTED 1000 each 0   ketoconazole (NIZORAL) 2 % cream Apply to both fett and between toes once daily for 6 weeks. 60 g 0   ketoconazole (NIZORAL) 2 % cream Apply 1 Application topically daily. 30 g 0   nitroGLYCERIN (NITROSTAT) 0.4 MG SL tablet Place 1 tablet (0.4 mg total) under the tongue every 5 (five) minutes as needed for chest pain. 30 tablet 12  potassium chloride SA (KLOR-CON M) 20 MEQ tablet Take 2 tablets (40 mEq total) by mouth daily. 180 tablet 3   rosuvastatin (CRESTOR) 10 MG tablet Take 1 tablet (10 mg total) by mouth daily. 90 tablet 3   triamcinolone ointment (KENALOG) 0.5 % Apply 1 Application topically 2 (two) times daily. 30 g 0   No current facility-administered medications for this visit.    SURGICAL HISTORY:  Past Surgical History:  Procedure Laterality Date   BREAST BIOPSY Left    BRONCHIAL BIOPSY  03/09/2021   Procedure: BRONCHIAL BIOPSIES;  Surgeon: Leslye Peer, MD;  Location: [facility] ENDOSCOPY;  Service: Pulmonary;;   BRONCHIAL BRUSHINGS  03/09/2021    Procedure: BRONCHIAL BRUSHINGS;  Surgeon: Leslye Peer, MD;  Location: [facility] ENDOSCOPY;  Service: Pulmonary;;   BRONCHIAL NEEDLE ASPIRATION BIOPSY  03/09/2021   Procedure: BRONCHIAL NEEDLE ASPIRATION BIOPSIES;  Surgeon: Leslye Peer, MD;  Location: [facility] ENDOSCOPY;  Service: Pulmonary;;   BUNIONECTOMY Bilateral 1994   CHOLECYSTECTOMY  01/17/2012   Procedure: LAPAROSCOPIC CHOLECYSTECTOMY;  Surgeon: Shelly Rubenstein, MD;  Location: [facility] OR;  Service: General;  Laterality: N/A;   FIDUCIAL MARKER PLACEMENT  03/09/2021   Procedure: FIDUCIAL MARKER PLACEMENT;  Surgeon: Leslye Peer, MD;  Location: [facility] ENDOSCOPY;  Service: Pulmonary;;   INTERCOSTAL NERVE BLOCK Right 08/14/2021   Procedure: INTERCOSTAL NERVE BLOCK;  Surgeon: Loreli Slot, MD;  Location: [facility] OR;  Service: Thoracic;  Laterality: Right;   NODE DISSECTION Right 08/14/2021   Procedure: NODE DISSECTION;  Surgeon: Loreli Slot, MD;  Location: [facility] OR;  Service: Thoracic;  Laterality: Right;   PUBOVAGINAL SLING  03/05/2003   SHOULDER ARTHROSCOPY WITH DISTAL CLAVICLE RESECTION Right 01/26/2016   Procedure: SHOULDER ARTHROSCOPY WITH DISTAL CLAVICLE RESECTION;  Surgeon: Jones Broom, MD;  Location: [facility];  Service: Orthopedics;  Laterality: Right;   SHOULDER ARTHROSCOPY WITH SUBACROMIAL DECOMPRESSION Right 01/26/2016   Procedure: SHOULDER ARTHROSCOPY WITH SUBACROMIAL DECOMPRESSION DEBRIDEMENT;  Surgeon: Jones Broom, MD;  Location: [facility];  Service: Orthopedics;  Laterality: Right;   TONSILLECTOMY  1975   TOTAL VAGINAL HYSTERECTOMY  03/05/2003   TUBAL LIGATION  1981   VAGINAL HYSTERECTOMY  2004   partial    VIDEO BRONCHOSCOPY WITH ENDOBRONCHIAL NAVIGATION N/A 03/09/2021   Procedure: VIDEO BRONCHOSCOPY WITH ENDOBRONCHIAL NAVIGATION;  Surgeon: Leslye Peer, MD;  Location: [facility] ENDOSCOPY;  Service: Pulmonary;  Laterality: N/A;    REVIEW OF SYSTEMS:  A comprehensive review of systems was  negative.   PHYSICAL EXAMINATION: General appearance: alert, cooperative, and no distress Head: Normocephalic, without obvious abnormality, atraumatic Neck: no adenopathy, no JVD, supple, symmetrical, trachea midline, and thyroid not enlarged, symmetric, no tenderness/mass/nodules Lymph nodes: Cervical, supraclavicular, and axillary nodes normal. Resp: clear to auscultation bilaterally Back: symmetric, no curvature. ROM normal. No CVA tenderness. Cardio: regular rate and rhythm, S1, S2 normal, no murmur, click, rub or gallop GI: soft, non-tender; bowel sounds normal; no masses,  no organomegaly Extremities: extremities normal, atraumatic, no cyanosis or edema  ECOG PERFORMANCE STATUS: 1 - Symptomatic but completely ambulatory  Blood pressure 137/83, pulse 74, temperature 98.1 F (36.7 C), temperature source Oral, resp. rate 18, weight 218 lb 11.2 oz (99.2 kg), SpO2 91 %.  LABORATORY DATA: Lab Results  Component Value Date   WBC 7.8 03/14/2023   HGB 16.4 (H) 03/14/2023   HCT 48.7 (H) 03/14/2023   MCV 85.4 03/14/2023   PLT 210 03/14/2023      Chemistry  Component Value Date/Time   NA 141 03/14/2023 0857   NA 139 06/18/2021 1006   K 3.6 03/14/2023 0857   CL 105 03/14/2023 0857   CO2 29 03/14/2023 0857   BUN 12 03/14/2023 0857   BUN 9 06/18/2021 1006   CREATININE 0.63 03/14/2023 0857   CREATININE 0.69 04/12/2016 0953   GLU 160 (H) 06/29/2021 1022      Component Value Date/Time   CALCIUM 9.4 03/14/2023 0857   ALKPHOS 76 03/14/2023 0857   AST 34 03/14/2023 0857   ALT 39 03/14/2023 0857   BILITOT 0.7 03/14/2023 0857       RADIOGRAPHIC STUDIES: US Soft Tissue Head/Neck (NON-THYROID)  Result Date: 02/27/2023 CLINICAL DATA:  62 year old female with a posterior left neck palpable abnormality reportedly discovered 3 weeks ago. EXAM: ULTRASOUND OF HEAD/NECK SOFT TISSUES TECHNIQUE: Ultrasound examination of the head and neck soft tissues was performed in the area of  clinical concern. COMPARISON:  Thyroid ultrasound the same day reported separately. FINDINGS: Left posterior neck (reportedly near the angle of the mandible) grayscale and color/power Doppler images in the palpable area of concern. Contralateral right neck images provided for comparison. Asymmetry in the palpable area corresponds to a superficial (epicenter only 2-3 mm below the skin surface) elongated hypoechoic soft tissue area of 9 x 3 x 12 mm. This has some increased through transmission, and appears to be overlying, near the interface of the subcutaneous layer and platysma. Spectral Doppler suggests some internal vascularity (image 14). Other surrounding fibromuscular architecture is preserved. The lesion is asymmetric when compared to the right neck. IMPRESSION: Ultrasound identifies a small, fairly superficial (possibly subdermal) asymmetric elongated 1.2 cm solid soft tissue nodule at the palpable area of concern. Given the superficial nature and nonspecific Ultrasound appearance the etiology and significance are unclear. Recommend follow-up by clinical exam, with Neck CT (IV contrast preferred) if the area enlarges or becomes painful. Electronically Signed   By: Odessa Fleming M.D.   On: 02/27/2023 10:52   US THYROID  Result Date: 02/23/2023 CLINICAL DATA:  Goiter. EXAM: THYROID ULTRASOUND TECHNIQUE: Ultrasound examination of the thyroid gland and adjacent soft tissues was performed. COMPARISON:  Prior thyroid ultrasound 10/08/2021 FINDINGS: Parenchymal Echotexture: Moderately heterogenous Isthmus: 0.7 cm Right lobe: 6.1 x 2.1 x 3.0 cm Left lobe: 7.4 x 4.2 x 3.4 cm _________________________________________________________ Estimated total number of nodules >/= 1 cm: 4 Number of spongiform nodules >/=  2 cm not described below (TR1): 0 Number of mixed cystic and solid nodules >/= 1.5 cm not described below (TR2): 0 _________________________________________________________ Diffusely enlarged, heterogeneous and  multinodular thyroid gland. Nodule # 1: Ill-defined isoechoic solid nodule versus pseudo nodule in the right lower gland measures grossly 2.2 x 1.9 x 1.4 cm. Consistent with TI-RADS category 3. *Given size (>/= 1.5 - 2.4 cm) and appearance, a follow-up ultrasound in 1 year should be considered based on TI-RADS criteria. Nodule # 5: Enlarging isoechoic solid mass at the lower pole of the left gland measures grossly 4.4 x 4.1 x 3.5 cm. TI-RADS category 3 nodule versus pseudo nodule. If a true nodule, this meets criteria to consider fine-needle aspiration biopsy. Nodule # 6: Similarly, an ill-defined nodule versus pseudo nodule in the left upper gland measures 3.5 x 2.6 x 2.6 cm. Solid, isoechoic and consistent with TI-RADS category 3 if a true nodule. This lesion also meets criteria to consider biopsy. IMPRESSION: 1. Diffusely enlarged, heterogeneous thyroid gland with multiple nodules versus pseudo nodules. 2. Nodules # 5 and # 6  would both meet criteria to consider fine-needle aspiration biopsy if true nodules. At the time of biopsy, if these are deemed pseudo nodules, biopsy may be deferred. 3. Nodule # 1 in the right lower gland is similar to slightly larger than seen previously and continues to meet criteria for imaging surveillance. Recommend follow-up ultrasound in 1 year. The above is in keeping with the ACR TI-RADS recommendations - J Am Coll Radiol 2017;14:587-595. Electronically Signed   By: Malachy Moan M.D.   On: 02/23/2023 16:59    ASSESSMENT AND PLAN: This is a very pleasant 62 years old white female diagnosed with a stage Ia (T1b, N0, M0) non-small cell lung cancer, adenocarcinoma presented with right upper lobe lung nodule diagnosed in October 2022 status post right upper lobectomy with lymph node dissection under the care of Dr. Dorris Fetch on August 14, 2021. The patient is currently on observation and she is feeling fine. She had CT scan of the neck and chest performed recently.  The  final report is still pending I personally and independently reviewed the scan of the chest and I do not see any clear evidence for disease recurrence or metastasis but I will wait for the final report for confirmation. I recommended for her to continue on observation with repeat CT scan of the chest in 1 year if there is no concerning findings for disease recurrence or metastasis. The patient was advised to call immediately if she has any concerning symptoms in the interval. The patient voices understanding of current disease status and treatment options and is in agreement with the current care plan.  All questions were answered. The patient knows to call the clinic with any problems, questions or concerns. We can certainly see the patient much sooner if necessary.  The total time spent in the appointment was 20 minutes.  Disclaimer: This note was dictated with voice recognition software. Similar sounding words can inadvertently be transcribed and may not be corrected upon review.

## 2023-03-17 ENCOUNTER — Other Ambulatory Visit: Payer: Self-pay | Admitting: Internal Medicine

## 2023-03-17 DIAGNOSIS — C3490 Malignant neoplasm of unspecified part of unspecified bronchus or lung: Secondary | ICD-10-CM

## 2023-03-29 ENCOUNTER — Ambulatory Visit
Admission: RE | Admit: 2023-03-29 | Discharge: 2023-03-29 | Disposition: A | Discharge status: Home / Self Care | Payer: 59 | Source: Ambulatory Visit | Attending: Family Medicine | Admitting: Family Medicine

## 2023-03-29 ENCOUNTER — Other Ambulatory Visit (HOSPITAL_COMMUNITY)
Admission: RE | Admit: 2023-03-29 | Discharge: 2023-03-29 | Disposition: A | Discharge status: Home / Self Care | Payer: 59 | Source: Ambulatory Visit | Attending: Radiology | Admitting: Radiology

## 2023-03-29 DIAGNOSIS — R911 Solitary pulmonary nodule: Secondary | ICD-10-CM | POA: Diagnosis not present

## 2023-03-29 DIAGNOSIS — E041 Nontoxic single thyroid nodule: Secondary | ICD-10-CM

## 2023-03-31 ENCOUNTER — Telehealth: Payer: Self-pay | Admitting: Internal Medicine

## 2023-03-31 LAB — CYTOLOGY - NON PAP

## 2023-03-31 NOTE — Telephone Encounter (Signed)
Called patient regarding August appointment, patient is notified.

## 2023-04-01 ENCOUNTER — Ambulatory Visit
Admission: RE | Admit: 2023-04-01 | Discharge: 2023-04-01 | Disposition: A | Discharge status: Home / Self Care | Payer: 59 | Source: Ambulatory Visit | Attending: Pediatrics | Admitting: Pediatrics

## 2023-04-01 DIAGNOSIS — Z1231 Encounter for screening mammogram for malignant neoplasm of breast: Secondary | ICD-10-CM | POA: Diagnosis not present

## 2023-04-06 ENCOUNTER — Telehealth: Payer: Self-pay | Admitting: Internal Medicine

## 2023-04-06 NOTE — Telephone Encounter (Signed)
Scheduled per 05/16 scheduling message, patient has been called and notified.

## 2023-04-21 DIAGNOSIS — L03032 Cellulitis of left toe: Secondary | ICD-10-CM | POA: Diagnosis not present

## 2023-04-21 DIAGNOSIS — A499 Bacterial infection, unspecified: Secondary | ICD-10-CM | POA: Diagnosis not present

## 2023-05-03 ENCOUNTER — Ambulatory Visit: Payer: 59 | Admitting: Podiatry

## 2023-05-09 ENCOUNTER — Ambulatory Visit (INDEPENDENT_AMBULATORY_CARE_PROVIDER_SITE_OTHER): Payer: 59 | Admitting: Podiatry

## 2023-05-09 DIAGNOSIS — L84 Corns and callosities: Secondary | ICD-10-CM | POA: Diagnosis not present

## 2023-05-09 DIAGNOSIS — B351 Tinea unguium: Secondary | ICD-10-CM

## 2023-05-09 DIAGNOSIS — E1142 Type 2 diabetes mellitus with diabetic polyneuropathy: Secondary | ICD-10-CM

## 2023-05-09 DIAGNOSIS — M79674 Pain in right toe(s): Secondary | ICD-10-CM | POA: Diagnosis not present

## 2023-05-09 DIAGNOSIS — M79675 Pain in left toe(s): Secondary | ICD-10-CM

## 2023-05-09 NOTE — Progress Notes (Signed)
  Subjective:  Patient ID: Sonya Walton, female    DOB: 1961/07/12,  MRN: [id number]  Chief Complaint  Patient presents with   Nail Problem    Diabetic Foot Care- Nail trim     62 y.o. female presents with the above complaint. History confirmed with patient. Patient presenting with pain related to dystrophic thickened elongated nails. Patient is unable to trim own nails related to nail dystrophy and/or mobility issues. Patient does have a history of T2DM. Patient does have callus present located at the right plantar 5th met head causing pain.   Objective:  Physical Exam: warm, good capillary refill nail exam onychomycosis of the toenails, onycholysis, and dystrophic nails DP pulses palpable, PT pulses palpable, and protective sensation intact Left Foot:  Pain with palpation of nails due to elongation and dystrophic growth.  Right Foot: Pain with palpation of nails due to elongation and dystrophic growth.   Assessment:   1. Pain due to onychomycosis of toenails of both feet   2. Diabetic peripheral neuropathy associated with type 2 diabetes mellitus (HCC)   3. Pre-ulcerative calluses      Plan:  Patient was evaluated and treated and all questions answered.  #Hyperkeratotic lesions/pre ulcerative calluses present subfifth metatarsal head right foot All symptomatic hyperkeratoses x 1 separate lesions were safely debrided with a sterile #10 blade to patient's level of comfort without incident. We discussed preventative and palliative care of these lesions including supportive and accommodative shoegear, padding, prefabricated and custom molded accommodative orthoses, use of a pumice stone and lotions/creams daily.  #Onychomycosis with pain  -Nails palliatively debrided as below. -Educated on self-care  Procedure: Nail Debridement Rationale: Pain Type of Debridement: manual, sharp debridement. Instrumentation: Nail nipper, rotary burr. Number of Nails: 8 - bilateral hallux nails  previously removed  Return in about 3 months (around 08/09/2023) for [facility].         Corinna Gab, DPM [facility] / [facility]

## 2023-05-13 ENCOUNTER — Ambulatory Visit: Payer: 59 | Admitting: Podiatry

## 2023-05-18 ENCOUNTER — Other Ambulatory Visit: Payer: Self-pay

## 2023-05-18 ENCOUNTER — Ambulatory Visit (INDEPENDENT_AMBULATORY_CARE_PROVIDER_SITE_OTHER): Payer: 59 | Admitting: Family Medicine

## 2023-05-18 ENCOUNTER — Encounter: Payer: Self-pay | Admitting: Family Medicine

## 2023-05-18 VITALS — BP 138/79 | HR 76 | Ht 62.0 in | Wt 217.2 lb

## 2023-05-18 DIAGNOSIS — Z794 Long term (current) use of insulin: Secondary | ICD-10-CM

## 2023-05-18 DIAGNOSIS — M25562 Pain in left knee: Secondary | ICD-10-CM | POA: Diagnosis not present

## 2023-05-18 DIAGNOSIS — E119 Type 2 diabetes mellitus without complications: Secondary | ICD-10-CM | POA: Diagnosis not present

## 2023-05-18 LAB — POCT GLYCOSYLATED HEMOGLOBIN (HGB A1C): HbA1c, POC (controlled diabetic range): 7.2 % — AB (ref 0.0–7.0)

## 2023-05-18 NOTE — Progress Notes (Signed)
    SUBJECTIVE:   CHIEF COMPLAINT / HPI:   Sonya [place] is a 62yo F w/ hx of T2DM, HTN, obesity, HLD that p/f L knee pain. - 1 month hx of L anterior knee pain and calf soreness - Was walking around [employer] and felt like her L leg was about to "give out" and then these symptoms started. - Has been using tylenol and voltaren with some benefit - Has been elevating leg at night.  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   OBJECTIVE:   BP 138/79   Pulse 76   Ht 5\' 2"  (1.575 m)   Wt 217 lb 3.2 oz (98.5 kg)   SpO2 96%   BMI 39.73 kg/m   General: Alert, pleasant woman. NAD. HEENT: NCAT. MMM. CV: RRR, no murmurs.  Resp: CTAB, no wheezing or crackles. Normal WOB on RA.  Abm: Soft, nontender, nondistended. BS present. Skin: Warm, well perfused  Ext: - No pain with palpation of knee or calf - No swelling or deformity - Able to bear weight and stand up without pain - Pain when rapidly extending knee. No pain with flexion.  ASSESSMENT/PLAN:   Knee pain, left 1 month hx of left anterior knee pain and calf soreness after an incident where her left knee felt like it was going to give out while walking.  Patient does help take care of her elderly mother and has to help lift her.  Has tried Tylenol and Voltaren with some benefit.  Presentation is most consistent with MSK etiology, with likely component of OA, given preceding trauma, patient body habitus, and needing physical therapy previously for her right knee.  Less likely infectious or inflammatory etiologies given lack of knee swelling, deformity, warmth, fevers.   - Offered physical therapy, however patient reports that it is difficult to get to PT appointments given her distance.  She does have exercises that she got for her right knee physical therapy.  Discussed slowly incorporating these knee exercises, along with continuing Tylenol and Voltaren.  Also encouraged to elevate and ice knee.   - Patient has follow-up  with her PCP soon, can follow-up on progress of her knee pain at that visit.  If it is still uncontrolled, can offer patient to go to PT.  Type 2 diabetes mellitus without complications (HCC) A1c stably elevated today.  Has follow-up with PCP, can discuss potential medication changes at that time.   F/u Recs: - consider starting metformin and/or GLP1 at follow-up. Could consider weaning insulin.  - f/u on knee pain. Can offer PT if still uncontrolled.  Lincoln Brigham, MD [facility]

## 2023-05-18 NOTE — Assessment & Plan Note (Signed)
A1c stably elevated today.  Has follow-up with PCP, can discuss potential medication changes at that time.

## 2023-05-18 NOTE — Patient Instructions (Signed)
Good to see you today - Thank you for coming in  Things we discussed today:  1) For your Left Knee pain, you most likely strained a muscle - Start doing the knee exercises you learned from Physical Therapy. Start slowly and work yourself up to doing the exercises. Take breaks to prevent overworking the knee.  - Continue taking tylenol and applying diclofenac gel to the knee - Keep the knee elevated while sleeping.

## 2023-05-18 NOTE — Assessment & Plan Note (Addendum)
1 month hx of left anterior knee pain and calf soreness after an incident where her left knee felt like it was going to give out while walking.  Patient does help take care of her elderly mother and has to help lift her.  Has tried Tylenol and Voltaren with some benefit.  Presentation is most consistent with MSK etiology, with likely component of OA, given preceding trauma, patient body habitus, and needing physical therapy previously for her right knee.  Less likely infectious or inflammatory etiologies given lack of knee swelling, deformity, warmth, fevers.   - Offered physical therapy, however patient reports that it is difficult to get to PT appointments given her distance.  She does have exercises that she got for her right knee physical therapy.  Discussed slowly incorporating these knee exercises, along with continuing Tylenol and Voltaren.  Also encouraged to elevate and ice knee.   - Patient has follow-up with her PCP soon, can follow-up on progress of her knee pain at that visit.  If it is still uncontrolled, can offer patient to go to PT.

## 2023-05-19 ENCOUNTER — Telehealth: Payer: Self-pay

## 2023-05-19 NOTE — Telephone Encounter (Signed)
Patient calls nurse line regarding yesterday's visit.   She was seen yesterday for knee pain. She reports that she was unable to find her handout on exercises that she could do at home.   She is asking if our office could mail her some beneficial PT exercises to help with knee pain.   Will forward to Dr. Sherrilee Gilles.   Veronda Prude, RN

## 2023-05-23 ENCOUNTER — Other Ambulatory Visit: Payer: Self-pay

## 2023-05-23 MED ORDER — ROSUVASTATIN CALCIUM 10 MG PO TABS: 10.0000 mg | ORAL_TABLET | Freq: Every day | ORAL | 3 refills | Status: DC

## 2023-05-23 NOTE — Telephone Encounter (Signed)
Printed PDF and placed in outgoing mail.   Veronda Prude, RN

## 2023-06-13 ENCOUNTER — Encounter (HOSPITAL_COMMUNITY): Payer: Self-pay

## 2023-06-13 ENCOUNTER — Ambulatory Visit (HOSPITAL_COMMUNITY)
Admission: RE | Admit: 2023-06-13 | Discharge: 2023-06-13 | Disposition: A | Discharge status: Home / Self Care | Payer: 59 | Source: Ambulatory Visit | Attending: Internal Medicine | Admitting: Internal Medicine

## 2023-06-13 ENCOUNTER — Other Ambulatory Visit: Payer: Self-pay | Admitting: Student

## 2023-06-13 ENCOUNTER — Inpatient Hospital Stay: Payer: 59 | Attending: Internal Medicine

## 2023-06-13 ENCOUNTER — Other Ambulatory Visit: Payer: Self-pay

## 2023-06-13 DIAGNOSIS — C3411 Malignant neoplasm of upper lobe, right bronchus or lung: Secondary | ICD-10-CM | POA: Insufficient documentation

## 2023-06-13 DIAGNOSIS — Z902 Acquired absence of lung [part of]: Secondary | ICD-10-CM | POA: Diagnosis not present

## 2023-06-13 DIAGNOSIS — C3490 Malignant neoplasm of unspecified part of unspecified bronchus or lung: Secondary | ICD-10-CM | POA: Diagnosis not present

## 2023-06-13 DIAGNOSIS — J439 Emphysema, unspecified: Secondary | ICD-10-CM | POA: Diagnosis not present

## 2023-06-13 DIAGNOSIS — I10 Essential (primary) hypertension: Secondary | ICD-10-CM

## 2023-06-13 DIAGNOSIS — R918 Other nonspecific abnormal finding of lung field: Secondary | ICD-10-CM | POA: Diagnosis not present

## 2023-06-13 DIAGNOSIS — E049 Nontoxic goiter, unspecified: Secondary | ICD-10-CM | POA: Diagnosis not present

## 2023-06-13 LAB — CMP (CANCER CENTER ONLY)
ALT: 35 U/L (ref 0–44)
AST: 26 U/L (ref 15–41)
Albumin: 4.1 g/dL (ref 3.5–5.0)
Alkaline Phosphatase: 70 U/L (ref 38–126)
Anion gap: 7 (ref 5–15)
BUN: 12 mg/dL (ref 8–23)
CO2: 33 mmol/L — ABNORMAL HIGH (ref 22–32)
Calcium: 9.4 mg/dL (ref 8.9–10.3)
Chloride: 103 mmol/L (ref 98–111)
Creatinine: 0.65 mg/dL (ref 0.44–1.00)
GFR, Estimated: >60 mL/min (ref >60)
Glucose, Bld: 135 mg/dL — ABNORMAL HIGH (ref 70–99)
Potassium: 3.6 mmol/L (ref 3.5–5.1)
Sodium: 143 mmol/L (ref 135–145)
Total Bilirubin: 0.8 mg/dL (ref 0.3–1.2)
Total Protein: 7.3 g/dL (ref 6.5–8.1)

## 2023-06-13 LAB — CBC WITH DIFFERENTIAL (CANCER CENTER ONLY)
Abs Immature Granulocytes: 0.02 10*3/uL (ref 0.00–0.07)
Basophils Absolute: 0 10*3/uL (ref 0.0–0.1)
Basophils Relative: 0 %
Eosinophils Absolute: 0.3 10*3/uL (ref 0.0–0.5)
Eosinophils Relative: 3 %
HCT: 50.4 % — ABNORMAL HIGH (ref 36.0–46.0)
Hemoglobin: 16.5 g/dL — ABNORMAL HIGH (ref 12.0–15.0)
Immature Granulocytes: 0 %
Lymphocytes Relative: 32 %
Lymphs Abs: 2.5 10*3/uL (ref 0.7–4.0)
MCH: 28.3 pg (ref 26.0–34.0)
MCHC: 32.7 g/dL (ref 30.0–36.0)
MCV: 86.3 fL (ref 80.0–100.0)
Monocytes Absolute: 0.7 10*3/uL (ref 0.1–1.0)
Monocytes Relative: 9 %
Neutro Abs: 4.3 10*3/uL (ref 1.7–7.7)
Neutrophils Relative %: 56 %
Platelet Count: 214 10*3/uL (ref 150–400)
RBC: 5.84 MIL/uL — ABNORMAL HIGH (ref 3.87–5.11)
RDW: 15.2 % (ref 11.5–15.5)
WBC Count: 7.8 10*3/uL (ref 4.0–10.5)
nRBC: 0 % (ref 0.0–0.2)

## 2023-06-13 MED ORDER — SODIUM CHLORIDE (PF) 0.9 % IJ SOLN
INTRAMUSCULAR | Status: AC
  Filled 2023-06-13: qty 50

## 2023-06-13 MED ORDER — IOHEXOL 300 MG/ML  SOLN
75.0000 mL | Freq: Once | INTRAMUSCULAR | Status: AC | PRN
  Administered 2023-06-13: 75 mL via INTRAVENOUS

## 2023-06-16 ENCOUNTER — Telehealth: Payer: Self-pay | Admitting: Medical Oncology

## 2023-06-16 NOTE — Telephone Encounter (Signed)
Dtr will relay August appt information to Katonya .

## 2023-06-23 ENCOUNTER — Inpatient Hospital Stay (HOSPITAL_BASED_OUTPATIENT_CLINIC_OR_DEPARTMENT_OTHER): Payer: 59 | Admitting: Internal Medicine

## 2023-06-23 VITALS — BP 135/72 | HR 72 | Temp 98.2°F | Resp 17 | Ht 62.0 in | Wt 218.0 lb

## 2023-06-23 DIAGNOSIS — C3411 Malignant neoplasm of upper lobe, right bronchus or lung: Secondary | ICD-10-CM | POA: Diagnosis not present

## 2023-06-23 DIAGNOSIS — C3490 Malignant neoplasm of unspecified part of unspecified bronchus or lung: Secondary | ICD-10-CM

## 2023-06-23 NOTE — Progress Notes (Signed)
Sonya [place] Telephone:([phone]) [phone]   Fax:(336) [phone]  OFFICE PROGRESS NOTE  Sonya Chard, DO [address] [place] [address]  DIAGNOSIS:  Stage Ia (T1b, N0, M0) non-small cell lung cancer, adenocarcinoma presented with right upper lobe lung nodule diagnosed in October 2022.  PRIOR THERAPY: Status post  right upper lobectomy and lymph node dissection under the care of Dr. Dorris Fetch on 08/14/2021.  CURRENT THERAPY: Observation.  INTERVAL HISTORY: Sonya [place] 62 y.o. female returns to the clinic today for annual follow-up visit.  The patient is feeling fine today with no concerning complaints.  She denied having any current chest pain, shortness of breath, cough or hemoptysis.  She has no nausea, vomiting, diarrhea or constipation.  She has no headache or visual changes.  She has no recent weight loss or night sweats.  She is here today for evaluation with repeat CT scan of the chest for restaging of her disease.  MEDICAL HISTORY: Past Medical History:  Diagnosis Date   Arthritis    Coronary artery disease    mild by CT 02/05/21   Hypertension    states under control with med., has been on med. since 2011   Immature cataract    Osteoarthritis 12/2015   right AC joint   Wears dentures    upper    ALLERGIES:  is allergic to percocet [oxycodone-acetaminophen] and clotrimazole.  MEDICATIONS:  Current Outpatient Medications  Medication Sig Dispense Refill   acetaminophen (TYLENOL) 500 MG tablet Take 500 mg by mouth every 6 (six) hours as needed for mild pain or moderate pain.     aspirin EC 81 MG tablet Take 1 tablet (81 mg total) by mouth daily. Swallow whole. 90 tablet 3   atenolol-chlorthalidone (TENORETIC) 50-25 MG tablet Take 1 tablet by mouth once daily 90 tablet 0   blood glucose meter kit and supplies KIT Inject 1 each into the skin as directed. Use meter once daily to measure blood glucose. 1 each 0   cetirizine (ZYRTEC) 10 MG tablet Take  10 mg by mouth daily as needed for allergies.     Cholecalciferol (VITAMIN D3) 2000 UNITS TABS Take 2,000 Units by mouth every morning.     clotrimazole (LOTRIMIN) 1 % cream Apply to both feet and between toes twice daily for 6 weeks. 60 g 1   dapagliflozin propanediol (FARXIGA) 5 MG TABS tablet Take 1 tablet by mouth once daily 90 tablet 3   glucose blood ([employer] ADVANCED GLUCOSE TEST) test strip Please specify directions, refills and quantity\ Once daily, [id number], 3 refills, 100 quantity Dr Towanda Octave MD 100 each 0   Insulin Glargine (BASAGLAR KWIKPEN) 100 UNIT/ML Inject 18 Units into the skin daily. 15 mL 3   Insulin Pen Needle (RELION PEN NEEDLES) 32G X 4 MM MISC USE 1 NEEDLE DAILY AS DIRECTED 1000 each 0   ketoconazole (NIZORAL) 2 % cream Apply to both fett and between toes once daily for 6 weeks. 60 g 0   ketoconazole (NIZORAL) 2 % cream Apply 1 Application topically daily. 30 g 0   nitroGLYCERIN (NITROSTAT) 0.4 MG SL tablet Place 1 tablet (0.4 mg total) under the tongue every 5 (five) minutes as needed for chest pain. 30 tablet 12   potassium chloride SA (KLOR-CON M) 20 MEQ tablet Take 2 tablets (40 mEq total) by mouth daily. 180 tablet 3   rosuvastatin (CRESTOR) 10 MG tablet Take 1 tablet (10 mg total) by mouth daily. 90 tablet  3   triamcinolone ointment (KENALOG) 0.5 % Apply 1 Application topically 2 (two) times daily. 30 g 0   No current facility-administered medications for this visit.    SURGICAL HISTORY:  Past Surgical History:  Procedure Laterality Date   BREAST BIOPSY Left    BRONCHIAL BIOPSY  03/09/2021   Procedure: BRONCHIAL BIOPSIES;  Surgeon: Leslye Peer, MD;  Location: [facility] ENDOSCOPY;  Service: Pulmonary;;   BRONCHIAL BRUSHINGS  03/09/2021   Procedure: BRONCHIAL BRUSHINGS;  Surgeon: Leslye Peer, MD;  Location: [facility] [place] ENDOSCOPY;  Service: Pulmonary;;   BRONCHIAL NEEDLE ASPIRATION BIOPSY  03/09/2021   Procedure: BRONCHIAL NEEDLE ASPIRATION BIOPSIES;  Surgeon: Leslye Peer, MD;  Location: [facility] ENDOSCOPY;  Service: Pulmonary;;   BUNIONECTOMY Bilateral 1994   CHOLECYSTECTOMY  01/17/2012   Procedure: LAPAROSCOPIC CHOLECYSTECTOMY;  Surgeon: Shelly Rubenstein, MD;  Location: [facility] OR;  Service: General;  Laterality: N/A;   FIDUCIAL MARKER PLACEMENT  03/09/2021   Procedure: FIDUCIAL MARKER PLACEMENT;  Surgeon: Leslye Peer, MD;  Location: [facility] ENDOSCOPY;  Service: Pulmonary;;   INTERCOSTAL NERVE BLOCK Right 08/14/2021   Procedure: INTERCOSTAL NERVE BLOCK;  Surgeon: Loreli Slot, MD;  Location: [facility] [place] OR;  Service: Thoracic;  Laterality: Right;   NODE DISSECTION Right 08/14/2021   Procedure: NODE DISSECTION;  Surgeon: Loreli Slot, MD;  Location: [facility] [place] OR;  Service: Thoracic;  Laterality: Right;   PUBOVAGINAL SLING  03/05/2003   SHOULDER ARTHROSCOPY WITH DISTAL CLAVICLE RESECTION Right 01/26/2016   Procedure: SHOULDER ARTHROSCOPY WITH DISTAL CLAVICLE RESECTION;  Surgeon: Jones Broom, MD;  Location: [facility] [place];  Service: Orthopedics;  Laterality: Right;   SHOULDER ARTHROSCOPY WITH SUBACROMIAL DECOMPRESSION Right 01/26/2016   Procedure: SHOULDER ARTHROSCOPY WITH SUBACROMIAL DECOMPRESSION DEBRIDEMENT;  Surgeon: Jones Broom, MD;  Location: [facility] [place];  Service: Orthopedics;  Laterality: Right;   TONSILLECTOMY  1975   TOTAL VAGINAL HYSTERECTOMY  03/05/2003   TUBAL LIGATION  1981   VAGINAL HYSTERECTOMY  2004   partial    VIDEO BRONCHOSCOPY WITH ENDOBRONCHIAL NAVIGATION N/A 03/09/2021   Procedure: VIDEO BRONCHOSCOPY WITH ENDOBRONCHIAL NAVIGATION;  Surgeon: Leslye Peer, MD;  Location: [facility] ENDOSCOPY;  Service: Pulmonary;  Laterality: N/A;    REVIEW OF SYSTEMS:  A comprehensive review of systems was negative.   PHYSICAL EXAMINATION: General appearance: alert, cooperative, and no distress Head: Normocephalic, without obvious abnormality, atraumatic Neck: no adenopathy, no JVD, supple, symmetrical, trachea midline, and thyroid  not enlarged, symmetric, no tenderness/mass/nodules Lymph nodes: Cervical, supraclavicular, and axillary nodes normal. Resp: clear to auscultation bilaterally Back: symmetric, no curvature. ROM normal. No CVA tenderness. Cardio: regular rate and rhythm, S1, S2 normal, no murmur, click, rub or gallop GI: soft, non-tender; bowel sounds normal; no masses,  no organomegaly Extremities: extremities normal, atraumatic, no cyanosis or edema  ECOG PERFORMANCE STATUS: 1 - Symptomatic but completely ambulatory  Blood pressure 135/72, pulse 72, temperature 98.2 F (36.8 C), resp. rate 17, height 5\' 2"  (1.575 m), weight 218 lb (98.9 kg), SpO2 95%.  LABORATORY DATA: Lab Results  Component Value Date   WBC 7.8 06/13/2023   HGB 16.5 (H) 06/13/2023   HCT 50.4 (H) 06/13/2023   MCV 86.3 06/13/2023   PLT 214 06/13/2023      Chemistry      Component Value Date/Time   NA 143 06/13/2023 0903   NA 139 06/18/2021 1006   K 3.6 06/13/2023 0903   CL 103 06/13/2023 0903   CO2 33 (H) 06/13/2023 0903   BUN 12 06/13/2023  0903   BUN 9 06/18/2021 1006   CREATININE 0.65 06/13/2023 0903   CREATININE 0.69 04/12/2016 0953   GLU 160 (H) 06/29/2021 1022      Component Value Date/Time   CALCIUM 9.4 06/13/2023 0903   ALKPHOS 70 06/13/2023 0903   AST 26 06/13/2023 0903   ALT 35 06/13/2023 0903   BILITOT 0.8 06/13/2023 0903       RADIOGRAPHIC STUDIES: CT Chest W Contrast  Result Date: 06/17/2023 CLINICAL DATA:  Non-small-cell lung cancer diagnosed in 2021. Right upper lobectomy. Asymptomatic. * Tracking Code: BO * EXAM: CT CHEST WITH CONTRAST TECHNIQUE: Multidetector CT imaging of the chest was performed during intravenous contrast administration. RADIATION DOSE REDUCTION: This exam was performed according to the departmental dose-optimization program which includes automated exposure control, adjustment of the mA and/or kV according to patient size and/or use of iterative reconstruction technique. CONTRAST:   75mL OMNIPAQUE IOHEXOL 300 MG/ML  SOLN COMPARISON:  03/14/2023 FINDINGS: Cardiovascular: Aortic atherosclerosis. Normal heart size, without pericardial effusion. Three vessel coronary artery calcification. No central pulmonary embolism, on this non-dedicated study. Mediastinum/Nodes: Similar left-greater-than-right thyroid enlargement and heterogeneity consistent with multinodular goiter. No supraclavicular adenopathy. No mediastinal or hilar adenopathy. Lungs/Pleura: Minimal right pleural thickening is unchanged. Tracheal deviation right secondary to thyroid enlargement. Right upper lobectomy. Chronic right middle lobe collapse with similar right middle lobe endobronchial narrowing. Right lower lobe ground-glass nodule is similar at 7 mm on 67/8. The posterior left upper lobe 2 mm pulmonary nodule is decreased to resolved, with only a faint linear density remaining on 45/8. Similar thickening of the lateral left major fissure including on 82/8. Upper Abdomen: Hepatic steatosis. Advanced cirrhosis. Normal imaged portions of the spleen, stomach, pancreas, kidneys, right adrenal gland. Mild left adrenal thickening. Musculoskeletal: Medial left breast nodule of 1.2 cm on 76/2 is present and relatively similar back to 01/30/2021 (negative mammogram 04/01/2023). No acute osseous abnormality. Right hemidiaphragm elevation. IMPRESSION: 1. Status post right upper lobectomy with similar right middle lobe collapse. No findings of recurrent or metastatic disease. 2. The tiny left upper lobe pulmonary nodule is decreased to resolved and does not warrant imaging follow-up. 3. Similar 7 mm right lower lobe ground-glass nodule. 4. Medial left breast nodule is present and relatively similar back to 01/30/2021. Recommend attention on follow-up. 5. Incidental findings, including: Aortic atherosclerosis (ICD10-I70.0), coronary artery atherosclerosis and emphysema (ICD10-J43.9). Cirrhosis. Hepatic steatosis. Electronically Signed   By:  Jeronimo Greaves M.D.   On: 06/17/2023 09:15    ASSESSMENT AND PLAN: This is a very pleasant 62 years old white female diagnosed with a stage Ia (T1b, N0, M0) non-small cell lung cancer, adenocarcinoma presented with right upper lobe lung nodule diagnosed in October 2022 status post right upper lobectomy with lymph node dissection under the care of Dr. Dorris Fetch on August 14, 2021. The patient is currently on observation and she is feeling fine with no concerning complaints. She had repeat CT scan of the chest performed recently.  I personally and independently reviewed the scan and discussed the result with the patient today. Her scan showed no concerning findings for disease recurrence or metastasis. I recommended for her to continue on observation with repeat CT scan of the chest in 1 year. She was advised to call immediately if she has any other concerning symptoms in the interval. The patient voices understanding of current disease status and treatment options and is in agreement with the current care plan.  All questions were answered. The patient knows to call the  clinic with any problems, questions or concerns. We can certainly see the patient much sooner if necessary.  The total time spent in the appointment was 20 minutes.  Disclaimer: This note was dictated with voice recognition software. Similar sounding words can inadvertently be transcribed and may not be corrected upon review.

## 2023-06-28 ENCOUNTER — Encounter: Payer: Self-pay | Admitting: Student

## 2023-06-28 ENCOUNTER — Ambulatory Visit: Payer: Medicaid Other | Admitting: Student

## 2023-06-28 VITALS — BP 114/74 | HR 65 | Ht 62.0 in | Wt 218.2 lb

## 2023-06-28 DIAGNOSIS — H5711 Ocular pain, right eye: Secondary | ICD-10-CM

## 2023-06-28 DIAGNOSIS — Z794 Long term (current) use of insulin: Secondary | ICD-10-CM

## 2023-06-28 DIAGNOSIS — L723 Sebaceous cyst: Secondary | ICD-10-CM

## 2023-06-28 DIAGNOSIS — E119 Type 2 diabetes mellitus without complications: Secondary | ICD-10-CM | POA: Diagnosis not present

## 2023-06-28 DIAGNOSIS — H5710 Ocular pain, unspecified eye: Secondary | ICD-10-CM | POA: Insufficient documentation

## 2023-06-28 DIAGNOSIS — I10 Essential (primary) hypertension: Secondary | ICD-10-CM | POA: Diagnosis not present

## 2023-06-28 MED ORDER — BASAGLAR KWIKPEN 100 UNIT/ML ~~LOC~~ SOPN: 10.0000 [IU] | PEN_INJECTOR | Freq: Every day | SUBCUTANEOUS | 3 refills | Status: DC

## 2023-06-28 MED ORDER — ROSUVASTATIN CALCIUM 10 MG PO TABS: 10.0000 mg | ORAL_TABLET | Freq: Every day | ORAL | 3 refills | Status: DC

## 2023-06-28 MED ORDER — ATENOLOL-CHLORTHALIDONE 50-25 MG PO TABS
1.0000 | ORAL_TABLET | Freq: Every day | ORAL | 3 refills | Status: DC
Start: 2023-06-28 — End: 2024-06-21

## 2023-06-28 MED ORDER — NITROGLYCERIN 0.4 MG SL SUBL: 0.4000 mg | SUBLINGUAL_TABLET | SUBLINGUAL | 12 refills | Status: DC | PRN

## 2023-06-28 MED ORDER — POTASSIUM CHLORIDE CRYS ER 20 MEQ PO TBCR: 40.0000 meq | EXTENDED_RELEASE_TABLET | Freq: Every day | ORAL | 3 refills | Status: DC

## 2023-06-28 MED ORDER — ZOSTER VAC RECOMB ADJUVANTED 50 MCG/0.5ML IM SUSR: 0.5000 mL | Freq: Once | INTRAMUSCULAR | 0 refills | Status: AC

## 2023-06-28 NOTE — Assessment & Plan Note (Signed)
Scheduled for cyst removal.

## 2023-06-28 NOTE — Assessment & Plan Note (Addendum)
Differential includes pterygium v conjunctival cyst. Reassuringly vision intact with normal extraocular motion. Will refer to ophthalmology for evaluation.

## 2023-06-28 NOTE — Progress Notes (Signed)
    SUBJECTIVE:   CHIEF COMPLAINT / HPI:   Sonya [place] is a 62 y.o. female  presenting for follow up of diabetes, cyst on her scalp, and eye pain.   T2DM:  Last A1c 7.2 1 month ago  Farxiga 5 mg, 10 units Basaglar daily in AM Morning fasting blood sugar: 104-122; she has had a few readings in the high 100s and low 200s several months ago. Most recently her sugars have been <110 fasting.   Cyst:  Present on her scalp for several months. She has had several removed prior in the clinic. Would like to see about getting this one removed.   Eye pain: endorses pain in her right eye with lateral gaze. She denies changes in vision. Her last eye exam was one year ago at [facility]. She has noticed several nodules in the conjunctiva of her eye on both sides.   PERTINENT  PMH / PSH: Reviewed and updated   OBJECTIVE:   BP 114/74   Pulse 65   Ht 5\' 2"  (1.575 m)   Wt 218 lb 3.2 oz (99 kg)   SpO2 95%   BMI 39.91 kg/m   Well-appearing, no acute distress Cardio: Regular rate, regular rhythm, no murmurs on exam. Eye: multiple cysts located under the upper eyelid near the medial canthus and lower eye lid on the left. Extraocular motion intact. Normal white sclera without injection, pupils equal and reactive to light  Pulm: Clear, no wheezing, no crackles. No increased work of breathing Abdominal: bowel sounds present, soft, non-tender, non-distended Extremities: no peripheral edema  Neuro: alert and oriented x3, speech normal in content, no facial asymmetry Psych:  Cognition and judgment appear intact. Alert, communicative  and cooperative with normal attention span and concentration. No apparent delusions, illusions, hallucinations      06/28/2023   10:03 AM 05/18/2023   10:53 AM 02/15/2023   10:33 AM  PHQ9 SCORE ONLY  PHQ-9 Total Score 2 1 1       ASSESSMENT/PLAN:   Type 2 diabetes mellitus without complications (HCC) With lower blood sugars and A1c 7.2 will continue current dose of  insulin. Instructed patient to keep blood glucose log with fasting morning sugars. Continue Farxiga 5 mg and 10 units Basaglar.  Patient needs diabetic eye exam.   Eye pain Differential includes pterygium v conjunctival cyst. Reassuringly vision intact with normal extraocular motion. Will refer to ophthalmology for evaluation.     Sebaceous cyst Scheduled for cyst removal.    Health Maintenance:  - gave prescription for shingles vaccination   [name], DO [facility]

## 2023-06-28 NOTE — Assessment & Plan Note (Addendum)
With lower blood sugars and A1c 7.2 will continue current dose of insulin. Instructed patient to keep blood glucose log with fasting morning sugars. Continue Farxiga 5 mg and 10 units Basaglar.  Patient needs diabetic eye exam.

## 2023-06-28 NOTE — Patient Instructions (Signed)
It was great to see you today!   Today we addressed: Eye pain: I am sending a referral to ophthalmology. You should hear to call back within 1-2 weeks. If you have not heard back please give our office a call and let us know.  You are scheduled to come in for a cyst removal on 10/2 with me. Please look at your upcoming appointments as listed below.   Future Appointments  Date Time Provider Department Center  08/03/2023  9:50 AM [name], DO [facility]  08/16/2023  1:45 PM Standiford, Jenelle Mages, DPM [facility]  08/22/2023 10:40 AM Nahser, Deloris Ping, MD [facility]  06/13/2024 10:00 AM [facility] None  06/14/2024  2:00 PM Si Gaul, MD [facility] None  06/19/2024  2:00 PM Si Gaul, MD [facility] None    Please arrive 15 minutes before your appointment to ensure smooth check in process.    Please call the clinic at [phone] if your symptoms worsen or you have any concerns.  Thank you for allowing me to participate in your care, Dr. [name] [facility]

## 2023-07-21 ENCOUNTER — Other Ambulatory Visit (HOSPITAL_COMMUNITY): Payer: Self-pay

## 2023-07-21 ENCOUNTER — Telehealth: Payer: Self-pay

## 2023-07-21 DIAGNOSIS — E119 Type 2 diabetes mellitus without complications: Secondary | ICD-10-CM

## 2023-07-21 DIAGNOSIS — Z794 Long term (current) use of insulin: Secondary | ICD-10-CM

## 2023-07-21 NOTE — Telephone Encounter (Signed)
Pharmacy Patient Advocate Encounter   Received notification from CoverMyMeds that prior authorization for [facility] is required/requested.   Insurance verification completed.   The patient is insured through [employer] .   Per test claim: PA required; PA started via CoverMyMeds. KEY [id number]. . Waiting for clinical questions to populate.

## 2023-07-21 NOTE — Telephone Encounter (Signed)
Pharmacy Patient Advocate Encounter   Received notification from CoverMyMeds that prior authorization for [facility] is required/requested.   Insurance verification completed.   The patient is insured through [employer] .   Per test claim:  LANTUS SOLOSTAR & LEVEMIR is preferred by the insurance.  If suggested medication is appropriate, Please send in a new RX and discontinue this one. If not, please advise as to why it's not appropriate so that we may request a Prior Authorization.

## 2023-07-22 MED ORDER — INSULIN GLARGINE 100 UNIT/ML SOLOSTAR PEN: 10.0000 [IU] | PEN_INJECTOR | Freq: Every day | SUBCUTANEOUS | 11 refills | Status: DC

## 2023-07-22 NOTE — Telephone Encounter (Signed)
Per insurance coverage will change LAI insulin to Lantus. New prescription sent to pharmacy.   [name], DO [facility], PGY-2 07/22/23 10:12 AM

## 2023-07-25 NOTE — Telephone Encounter (Signed)
Pharmacy Patient Advocate Encounter   PA required; PA submitted to [facility] Medicaid via CoverMyMeds Key/confirmation #/[id number]. Status is pending

## 2023-07-26 NOTE — Telephone Encounter (Signed)
Patient calls nurse line regarding Farixga.   Checked status of PA via Cover my meds. PA denied. They did not give additional reasoning in denial. They should be faxing our office with this information.   Will forward to PCP.   Veronda Prude, RN

## 2023-07-26 NOTE — Telephone Encounter (Signed)
Denial Reason from Medicaid ([employer]):    Letter scanned to patients media as well

## 2023-08-03 ENCOUNTER — Encounter: Payer: Self-pay | Admitting: Student

## 2023-08-03 ENCOUNTER — Ambulatory Visit: Payer: Medicaid Other | Admitting: Student

## 2023-08-03 ENCOUNTER — Ambulatory Visit: Payer: Self-pay | Admitting: Student

## 2023-08-03 VITALS — BP 131/76 | HR 69 | Ht 62.0 in | Wt 217.4 lb

## 2023-08-03 DIAGNOSIS — E119 Type 2 diabetes mellitus without complications: Secondary | ICD-10-CM | POA: Diagnosis not present

## 2023-08-03 DIAGNOSIS — L723 Sebaceous cyst: Secondary | ICD-10-CM

## 2023-08-03 DIAGNOSIS — Z794 Long term (current) use of insulin: Secondary | ICD-10-CM

## 2023-08-03 NOTE — Assessment & Plan Note (Signed)
Patient needs SGLT2i - Farxiga 5 mg due to being unable to tolerate metformin in the past and having comorbid conditions including HTN and HLD. She is also a former smoker (quit 2019) which puts her at higher risk for heart disease.  - continue Farxiga 5 mg  - continue Lantus 10 units

## 2023-08-03 NOTE — Progress Notes (Signed)
    SUBJECTIVE:   CHIEF COMPLAINT / HPI:   Sonya Walton is a 62 y.o. female  presenting for diabetes follow up and cyst removal.   T2DM:  Medications: Farxiga 5 mg and Lantus 10 units  Last A1c 7.7 2 months ago  Tried metformin but was unable to tolerate in the past   PERTINENT  PMH / PSH: Reviewed and updated   OBJECTIVE:   BP 131/76   Pulse 69   Ht 5\' 2"  (1.575 m)   Wt 217 lb 6.4 oz (98.6 kg)   SpO2 96%   BMI 39.76 kg/m   Well-appearing, no acute distress Cardio: Regular rate, regular rhythm, no murmurs on exam. Pulm: Clear, no wheezing, no crackles. No increased work of breathing Abdominal: bowel sounds present, soft, non-tender, non-distended Extremities: no peripheral edema   Derm: 1-2 cm hard superficial cyst located on right scalp        08/03/2023   10:18 AM 06/28/2023   10:03 AM 05/18/2023   10:53 AM  PHQ9 SCORE ONLY  PHQ-9 Total Score 1 2 1       ASSESSMENT/PLAN:   Type 2 diabetes mellitus without complications (HCC) Patient needs SGLT2i - Farxiga 5 mg due to being unable to tolerate metformin in the past and having comorbid conditions including HTN and HLD. She is also a former smoker (quit 2019) which puts her at higher risk for heart disease.  - continue Farxiga 5 mg  - continue Lantus 10 units   Sebaceous cyst Patient consented for procedure.  Prepped with alcohol use lidocaine cold spray prior to injecting 1 to 2 cc of lidocaine with epinephrine around the cyst.  Patient was then swabbed with iodine.  Donned sterile gloves.  Sterile field used scalpel to remove excess hair from cyst.  Made 5 mm incision on top of cyst used pickups and scissors to excavate around cystic area.  Removed top of cyst and then was able to remove partial contents.  Used scissors and pickups to remove cystic sac.  Sac was not inspected and appeared to be removed in its entirety.  130 stitch placed for closure.  Patient tolerated procedure well with minimal blood loss.   Instructed to follow-up with nurse clinic in 5 to 7 days for suture removal.   [name], DO [facility]

## 2023-08-03 NOTE — Assessment & Plan Note (Signed)
Patient consented for procedure.  Prepped with alcohol use lidocaine cold spray prior to injecting 1 to 2 cc of lidocaine with epinephrine around the cyst.  Patient was then swabbed with iodine.  Donned sterile gloves.  Sterile field used scalpel to remove excess hair from cyst.  Made 5 mm incision on top of cyst used pickups and scissors to excavate around cystic area.  Removed top of cyst and then was able to remove partial contents.  Used scissors and pickups to remove cystic sac.  Sac was not inspected and appeared to be removed in its entirety.  130 stitch placed for closure.  Patient tolerated procedure well with minimal blood loss.  Instructed to follow-up with nurse clinic in 5 to 7 days for suture removal.

## 2023-08-03 NOTE — Patient Instructions (Signed)
It was great to see you today!   For your cyst, please schedule a nurse visit at check out to have your stitch removed in 5-7 days.   Be gentle with the area, you can wash with neutral soap and pat dry.   Future Appointments  Date Time Provider Department Center  08/16/2023  1:45 PM Tracie Harrier [facility]  08/22/2023 10:40 AM Nahser, Deloris Ping, MD [facility]  06/13/2024 10:00 AM [facility] LAB [facility] None  06/14/2024  2:00 PM Si Gaul, MD [facility] None  06/19/2024  2:00 PM Si Gaul, MD [facility] None    Please arrive 15 minutes before your appointment to ensure smooth check in process.    Please call the clinic at [phone] if your symptoms worsen or you have any concerns.  Thank you for allowing me to participate in your care, Dr. [name] [facility]

## 2023-08-04 ENCOUNTER — Telehealth: Payer: Self-pay

## 2023-08-04 ENCOUNTER — Other Ambulatory Visit (HOSPITAL_COMMUNITY): Payer: Self-pay

## 2023-08-04 NOTE — Telephone Encounter (Signed)
-----   Message from [facility] Jasmine December S sent at 08/03/2023 11:58 AM EDT ----- Hi!  Green Team, will you call and follow up for an appointment for this patient?  [facility] [address] [place], [place] [address] [phone]  Thanks! Melvenia Beam ----- Message ----- From: [name], DO Sent: 08/03/2023  11:15 AM EDT To: Sunday Spillers, CMA  Hetty Ely,   I sent a referral to ophthalmology for this patient's eye cysts. Are you able to follow up for an appointment?   [name], DO [facility], PGY-2 08/03/23 11:15 AM

## 2023-08-04 NOTE — Telephone Encounter (Signed)
Called [place] for patient to set an appointment. Was informed that patient would need to be referral to the center from the PCP stating what is the reason for patient to be seen. Did give me the fax number to send over paperwork. [phone]  Thanks,  Drusilla Kanner, CMA

## 2023-08-05 ENCOUNTER — Telehealth: Payer: Self-pay

## 2023-08-05 NOTE — Telephone Encounter (Signed)
-----   Message from [facility] Jasmine December S sent at 08/05/2023 10:31 AM EDT ----- Dr Hyacinth Meeker has put in a referral that has been sent to [facility]. If she hasn't been seen by them, I would say she needs to call and make an appt with them.  If she has been to see them and they did not ask her to schedule another appt she can either call them and ask or we can ask Dr Hyacinth Meeker???  Sorry, I know that is not much of an answer. Pecolia Ades ----- Message ----- From: Glennie Hawk, CMA Sent: 08/05/2023  10:25 AM EDT To: Sunday Spillers, CMA  Good morning,  Just need clarification.  Does the patient need a follow up at [facility] or at the Retina clinic?  Thanks Shelly ----- Message ----- From: Sunday Spillers, CMA Sent: 08/03/2023  12:02 PM EDT To: [name], DO; [facility]  Hi!  Green Team, will you call and follow up for an appointment for this patient?  [facility] [address] [place], [place] [address] [phone]  Thanks! Melvenia Beam ----- Message ----- From: [name], DO Sent: 08/03/2023  11:15 AM EDT To: Sunday Spillers, CMA  Hetty Ely,   I sent a referral to ophthalmology for this patient's eye cysts. Are you able to follow up for an appointment?   [name], DO [facility], PGY-2 08/03/23 11:15 AM

## 2023-08-05 NOTE — Telephone Encounter (Signed)
Called patient and provided her with the number and address to [facility].  Patient has not been there before and will call and make an appointment if she does not hear from them first.  .Glennie Hawk, CMA

## 2023-08-08 ENCOUNTER — Ambulatory Visit: Payer: Medicaid Other

## 2023-08-08 DIAGNOSIS — Z4802 Encounter for removal of sutures: Secondary | ICD-10-CM

## 2023-08-08 NOTE — Progress Notes (Signed)
Patient presents to nurse clinic for suture removal.   Upon assessment, area has dried blood/ scabbing, causing difficulty in viewing suture.   Requested assistance from Dr. Jennette Kettle for removal.   Applied gauze with saline to assist with visualization. Dr. Jennette Kettle was able to remove the one suture that was placed on 08/03/23. Site showed no signs of infection.   Provided patient with return precautions.   Veronda Prude, RN

## 2023-08-11 MED ORDER — DAPAGLIFLOZIN PROPANEDIOL 5 MG PO TABS
5.0000 mg | ORAL_TABLET | Freq: Every day | ORAL | 3 refills | Status: DC
Start: 2023-08-11 — End: 2023-11-10

## 2023-08-11 NOTE — Telephone Encounter (Signed)
.  Pharmacy Patient Advocate Encounter   PA required; PA submitted to [facility] Medicaid via CoverMyMeds Key/confirmation #/EOC [id number]. Status is pending

## 2023-08-11 NOTE — Telephone Encounter (Cosign Needed)
Sent refill for Marcelline Deist to patient's pharmacy.   [name], DO [facility], PGY-2 08/11/23 2:04 PM

## 2023-08-11 NOTE — Telephone Encounter (Signed)
Pharmacy Patient Advocate Encounter  Received notification from [facility] that Prior Authorization for Marcelline Deist has been APPROVED from 08/11/23 to 08/10/24

## 2023-08-11 NOTE — Addendum Note (Signed)
Addended by: [name] on: 08/11/2023 02:04 PM   Modules accepted: Orders

## 2023-08-16 ENCOUNTER — Ambulatory Visit (INDEPENDENT_AMBULATORY_CARE_PROVIDER_SITE_OTHER): Payer: Medicaid Other | Admitting: Podiatry

## 2023-08-16 ENCOUNTER — Ambulatory Visit: Payer: 59 | Admitting: Podiatry

## 2023-08-16 DIAGNOSIS — L84 Corns and callosities: Secondary | ICD-10-CM | POA: Diagnosis not present

## 2023-08-16 DIAGNOSIS — M79674 Pain in right toe(s): Secondary | ICD-10-CM | POA: Diagnosis not present

## 2023-08-16 DIAGNOSIS — M79675 Pain in left toe(s): Secondary | ICD-10-CM

## 2023-08-16 DIAGNOSIS — E1142 Type 2 diabetes mellitus with diabetic polyneuropathy: Secondary | ICD-10-CM | POA: Diagnosis not present

## 2023-08-16 DIAGNOSIS — B351 Tinea unguium: Secondary | ICD-10-CM

## 2023-08-16 NOTE — Progress Notes (Signed)
Subjective:  Patient ID: Sonya Walton, female    DOB: 12/28/1960,  MRN: [id number]  Chief Complaint  Patient presents with   Nail Problem    Rm 1-  nail trim.   -medial corner of right great toe where nail was removed- sore and flared up- saw some pus about 2 days ago.  Used peroxide and antibiotic -Left heel is dry and crusty. -Corn right foot fifth metatarsal head.  Primary care physician Dr. Hyacinth Meeker- saw her last month    62 y.o. female presents with the above complaint. History confirmed with patient. Patient presenting with pain related to dystrophic thickened elongated nails. Patient is unable to trim own nails related to nail dystrophy and/or mobility issues. Patient does have a history of T2DM. Patient does have callus present located at the right plantar 5th met head as well as left heel causing pain.   Objective:  Physical Exam: warm, good capillary refill nail exam onychomycosis of the toenails, onycholysis, and dystrophic nails DP pulses palpable, PT pulses palpable, and protective sensation intact Left Foot:  Pain with palpation of nails due to elongation and dystrophic growth.  Hyperkeratotic lesion plantar aspect of the left heel and the medial side causing pain Right Foot: Pain with palpation of nails due to elongation and dystrophic growth.  Hyperkeratotic lesion subfifth metatarsal head causing pain  Assessment:   1. Pre-ulcerative calluses   2. Pain due to onychomycosis of toenails of both feet   3. Diabetic peripheral neuropathy associated with type 2 diabetes mellitus (HCC)       Plan:  Patient was evaluated and treated and all questions answered.  #Hyperkeratotic lesions/pre ulcerative calluses present subfifth metatarsal head right foot All symptomatic hyperkeratoses x 2 separate lesions were safely debrided with a sterile #10 blade to patient's level of comfort without incident. We discussed preventative and palliative care of these lesions including  supportive and accommodative shoegear, padding, prefabricated and custom molded accommodative orthoses, use of a pumice stone and lotions/creams daily.  #Onychomycosis with pain  -Nails palliatively debrided as below. -Educated on self-care  Procedure: Nail Debridement Rationale: Pain Type of Debridement: manual, sharp debridement. Instrumentation: Nail nipper, rotary burr. Number of Nails: 8 - bilateral hallux nails previously removed  Return in about 3 months (around 11/16/2023) for [facility].         Corinna Gab, DPM [facility]

## 2023-08-18 ENCOUNTER — Encounter: Payer: Self-pay | Admitting: Cardiovascular Disease

## 2023-08-18 NOTE — Progress Notes (Signed)
No show  This encounter was created in error - please disregard.

## 2023-08-22 ENCOUNTER — Ambulatory Visit: Payer: Medicaid Other | Admitting: Cardiovascular Disease

## 2023-08-26 ENCOUNTER — Ambulatory Visit: Payer: Medicaid Other | Admitting: Family Medicine

## 2023-08-26 VITALS — BP 118/70 | HR 67 | Temp 98.6°F | Ht 62.0 in | Wt 217.2 lb

## 2023-08-26 DIAGNOSIS — N898 Other specified noninflammatory disorders of vagina: Secondary | ICD-10-CM

## 2023-08-26 DIAGNOSIS — B3731 Acute candidiasis of vulva and vagina: Secondary | ICD-10-CM | POA: Insufficient documentation

## 2023-08-26 LAB — POCT WET PREP (WET MOUNT)
Clue Cells Wet Prep Whiff POC: NEGATIVE
Trichomonas Wet Prep HPF POC: ABSENT
WBC, Wet Prep HPF POC: NONE SEEN

## 2023-08-26 MED ORDER — FLUCONAZOLE 150 MG PO TABS: 150.0000 mg | ORAL_TABLET | Freq: Once | ORAL | 0 refills | Status: AC

## 2023-08-26 NOTE — Assessment & Plan Note (Signed)
Symptoms consistent with vaginal yeast infection, increased suspicion due to taking SGLT2 inhibitor.  Wet mount collected but inconclusive.  Sent fluconazole 150 mg to pharmacy.  Advised patient to call us if her symptoms do not improve within 2 days or completely resolved in 2 weeks. -Advised to make an appointment with PCP to discuss Marcelline Deist use if this is what contributed to her yeast infection -Advised to use Dove sensitive soap to clean her vulva, avoid douching products.

## 2023-08-26 NOTE — Patient Instructions (Signed)
It was great to see you today! Thank you for choosing [facility] for your primary care. Sonya Walton was seen for vaginal itching.  Today we addressed: I am treating you with a medicine called fluconazole.  You take it 1 time.  Your symptoms should start to improve within 24 to 48 hours, and completely resolved within 2 weeks.  If they persist past then, please make an appointment to be seen again. If your lab results are normal, I will send you a message on MyChart.  Otherwise I will call you Consider making an appointment with PCP to discuss your Marcelline Deist  If you haven't already, sign up for My Chart to have easy access to your labs results, and communication with your primary care physician.  Call the clinic at [phone] if your symptoms worsen or you have any concerns.  Please arrive 15 minutes before your appointment to ensure smooth check in process.  We appreciate your efforts in making this happen.  Thank you for allowing me to participate in your care, Para March, DO 08/26/2023, 9:07 AM PGY-1, [facility]

## 2023-08-26 NOTE — Progress Notes (Signed)
    SUBJECTIVE:   CHIEF COMPLAINT / HPI:   Patient presents with vaginal itching, burning, and pain around her vulva x 1 week.  States that it hurts when she wipes.  States that she has had yeast infection in other parts of her body before, symptoms feel similar.  Denies abdominal pain, dysuria, vaginal bleeding. Patient takes Marcelline Deist, states that she has been on this for couple months.  Was on Jardiance prior.  Denies any vaginal yeast infection since starting SGLT2.  Denies using any douching products.  No concern for STD, patient has not been sexually active since 2018.  PERTINENT  PMH / PSH: T2DM, HTN, lung cancer  OBJECTIVE:   BP 118/70   Pulse 67   Temp 98.6 F (37 C)   Ht 5\' 2"  (1.575 m)   Wt 217 lb 3.2 oz (98.5 kg)   SpO2 94%   BMI 39.73 kg/m   Pelvic - Erythematous vulva, no lesions.  White discharge with black speckles in vaginal canal.  No odor.  Normal-appearing cervix, with white discharge.  ASSESSMENT/PLAN:   Vaginal yeast infection Symptoms consistent with vaginal yeast infection, increased suspicion due to taking SGLT2 inhibitor.  Wet mount collected but inconclusive.  Sent fluconazole 150 mg to pharmacy.  Advised patient to call us if her symptoms do not improve within 2 days or completely resolved in 2 weeks. -Advised to make an appointment with PCP to discuss Marcelline Deist use if this is what contributed to her yeast infection -Advised to use Dove sensitive soap to clean her vulva, avoid douching products.     Para March, DO [facility]

## 2023-08-29 ENCOUNTER — Telehealth: Payer: Self-pay

## 2023-08-29 NOTE — Telephone Encounter (Signed)
Patient calls nurse line regarding opthalmology referral.    She reports that she attempted to schedule appointment with [facility], however, was told that they never received referral.   She was informed that our office needs to send paper referral via fax.   Melvenia Beam- What all do I need to print off when faxing the referral? Do they need the most recent office note or just the referral?   Thanks.   Veronda Prude, RN

## 2023-08-29 NOTE — Telephone Encounter (Signed)
Called and received fax number [phone].   Faxed office note and referral to this number.   Veronda Prude, RN

## 2023-09-02 NOTE — Telephone Encounter (Signed)
Patient returns call to nurse line. She states that [facility] provider reviewed her chart and feels like that she should establish with general ophthalmology.   Will forward to [facility] to update referral.   Thanks.   Veronda Prude, RN

## 2023-09-02 NOTE — Telephone Encounter (Signed)
Called patient and informed.   Naw Lasala C Leston Schueller, RN  

## 2023-09-04 NOTE — Progress Notes (Unsigned)
  Cardiology Office Note:  .   Date:  09/05/2023  ID:  Sonya Walton, DOB 1961-08-04, MRN [id number] PCP: [name], DO  [facility] Providers Cardiologist:  Meriam Sprague, MD (Inactive)    History of Present Illness: .    Nov. 4, 2024   Sonya Walton is a 62 y.o. female patient of Dr. Shari Prows.  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 No angina   No dyspnea   Takes care of her 71 yo mother  She quite smoking in 2019  Had partial pneumonectomy several years ago      ROS:   Studies Reviewed: Marland Kitchen   EKG Interpretation Date/Time:  Monday September 05 2023 11:29:12 EST Ventricular Rate:  73 PR Interval:  186 QRS Duration:  98 QT Interval:  414 QTC Calculation: 456 R Axis:   59  Text Interpretation: Normal sinus rhythm Normal ECG When compared with ECG of 12-Aug-2021 14:04, Questionable change in QRS axis Nonspecific T wave abnormality no longer evident in Anterior leads Confirmed by Kristeen Miss ([id number]) on 09/05/2023 11:38:20 AM     Risk Assessment/Calculations:             Physical Exam:   VS:  BP 120/66   Pulse 61   Ht 5' 2.5" (1.588 m)   Wt 214 lb 9.6 oz (97.3 kg)   SpO2 93%   BMI 38.63 kg/m    Wt Readings from Last 3 Encounters:  09/05/23 214 lb 9.6 oz (97.3 kg)  08/26/23 217 lb 3.2 oz (98.5 kg)  08/03/23 217 lb 6.4 oz (98.6 kg)    GEN: Well nourished, well developed in no acute distress NECK: No JVD; No carotid bruits CARDIAC: RRR, no murmurs, rubs, gallops RESPIRATORY:  Clear to auscultation without rales, wheezing or rhonchi  ABDOMEN: Soft, non-tender, non-distended EXTREMITIES:  No edema; No deformity   ASSESSMENT AND PLAN: .      Hyperlipidemia:   labs from last year look good.  Cont rosuvastatin .   Reckeck lipids today  2.  HTN:   bp looks good.   Cont meds.  Advised more exercise.   Follow up in 1 year           Dispo: 1 year    Signed, Kristeen Miss, MD

## 2023-09-05 ENCOUNTER — Telehealth: Payer: Self-pay

## 2023-09-05 ENCOUNTER — Encounter: Payer: Self-pay | Admitting: Cardiovascular Disease

## 2023-09-05 ENCOUNTER — Ambulatory Visit: Payer: Medicaid Other | Attending: Cardiovascular Disease | Admitting: Cardiovascular Disease

## 2023-09-05 VITALS — BP 120/66 | HR 61 | Ht 62.5 in | Wt 214.6 lb

## 2023-09-05 DIAGNOSIS — E785 Hyperlipidemia, unspecified: Secondary | ICD-10-CM

## 2023-09-05 DIAGNOSIS — I25118 Atherosclerotic heart disease of native coronary artery with other forms of angina pectoris: Secondary | ICD-10-CM

## 2023-09-05 NOTE — Telephone Encounter (Signed)
Patient calls nurse line requesting another round of Diflucan.   She reports she took the first pill, however feels her symptoms have not greatly improved.   She reports continued vaginal itching and dryness.   She is requesting another Diflucan and something to help with vaginal dryness.   Advised will forward to provider who saw patient.

## 2023-09-05 NOTE — Patient Instructions (Signed)
Lab Work: Lipids today If you have labs (blood work) drawn today and your tests are completely normal, you will receive your results only by: MyChart Message (if you have MyChart) OR A paper copy in the mail If you have any lab test that is abnormal or we need to change your treatment, we will call you to review the results.  Follow-Up: At [facility], you and your health needs are our priority.  As part of our continuing mission to provide you with exceptional heart care, we have created designated Provider Care Teams.  These Care Teams include your primary Cardiologist (physician) and Advanced Practice Providers (APPs -  Physician Assistants and Nurse Practitioners) who all work together to provide you with the care you need, when you need it.  Your next appointment:   1 year(s)  Provider:   Kristeen Miss, MD

## 2023-09-06 LAB — LIPID PANEL
Chol/HDL Ratio: 2.7 ratio (ref 0.0–4.4)
Cholesterol, Total: 133 mg/dL (ref 100–199)
HDL: 50 mg/dL (ref >39)
LDL Chol Calc (NIH): 60 mg/dL (ref 0–99)
Triglycerides: 132 mg/dL (ref 0–149)
VLDL Cholesterol Cal: 23 mg/dL (ref 5–40)

## 2023-09-06 MED ORDER — PREMARIN 0.625 MG/GM VA CREA: TOPICAL_CREAM | VAGINAL | 12 refills | Status: DC

## 2023-09-06 NOTE — Telephone Encounter (Signed)
Symptoms likely secondary to vaginal atrophy as patient is postmenopausal.  There was no sign of lichen sclerosis on my exam in the office.  Given the Diflucan did not help, I suspect symptoms are more related to vaginal atrophy.  Spoke with patient on the phone, would like to try Premarin cream.  Advised to give [place] a call and come back to the office if the cream does not help her symptoms.

## 2023-09-15 DIAGNOSIS — E119 Type 2 diabetes mellitus without complications: Secondary | ICD-10-CM | POA: Diagnosis not present

## 2023-09-15 DIAGNOSIS — H25813 Combined forms of age-related cataract, bilateral: Secondary | ICD-10-CM | POA: Diagnosis not present

## 2023-10-05 LAB — HM DIABETES EYE EXAM

## 2023-10-06 ENCOUNTER — Telehealth: Payer: Self-pay | Admitting: Pharmacist

## 2023-10-06 DIAGNOSIS — E119 Type 2 diabetes mellitus without complications: Secondary | ICD-10-CM

## 2023-10-06 MED ORDER — INSULIN GLARGINE 100 UNIT/ML SOLOSTAR PEN: 20.0000 [IU] | PEN_INJECTOR | Freq: Every day | SUBCUTANEOUS | Status: DC

## 2023-10-06 NOTE — Telephone Encounter (Signed)
Patient contacts our office and request follow-up for high blood sugars States she cannot reach PCP who is on maternity leave.   Patient reports yeast infection following dose reduction of insulin from 20 to 10 units of insulin glargine. She wonders if the Marcelline Deist is more likely to cause yeast infection than Jardiance which she has tolerated in the past.    Upon call back she reports no symptoms of yeast infection currently but does report having higher glucose readings.  She has self-titrated herself back from 10 to 20 units of glargine without any hypoglycemia or other symptoms.   Medication Plan: - Asked patient to HOLD the Farxiga (dapagliflozin) for now.  - Continue insulin glargine (Basaglar or Lantus) at 20 units once daily until follow-up with me in early January.  We discussed that we will likely restart SGLT-2 at that visit when we can discuss options and risk/benefits.   Total time with patient call and documentation of interaction: 18 minutes.

## 2023-10-07 NOTE — Telephone Encounter (Signed)
Reviewed and agree with Dr Koval's plan.   

## 2023-10-10 DIAGNOSIS — H5213 Myopia, bilateral: Secondary | ICD-10-CM | POA: Diagnosis not present

## 2023-11-10 ENCOUNTER — Encounter: Payer: Self-pay | Admitting: Pharmacist

## 2023-11-10 ENCOUNTER — Ambulatory Visit: Payer: Medicaid Other | Admitting: Pharmacist

## 2023-11-10 VITALS — BP 119/79 | HR 69 | Wt 220.0 lb

## 2023-11-10 DIAGNOSIS — Z794 Long term (current) use of insulin: Secondary | ICD-10-CM | POA: Diagnosis not present

## 2023-11-10 DIAGNOSIS — E119 Type 2 diabetes mellitus without complications: Secondary | ICD-10-CM | POA: Diagnosis not present

## 2023-11-10 MED ORDER — ACCU-CHEK SOFTCLIX LANCETS MISC: 12 refills | Status: AC

## 2023-11-10 MED ORDER — ACCU-CHEK GUIDE TEST VI STRP: ORAL_STRIP | 12 refills | Status: AC

## 2023-11-10 MED ORDER — ACCU-CHEK GUIDE W/DEVICE KIT: 1.0000 | PACK | Freq: Once | 1 refills | Status: AC

## 2023-11-10 MED ORDER — ACCU-CHEK SOFTCLIX LANCET DEV KIT: 1.0000 | PACK | Freq: Once | 1 refills | Status: AC

## 2023-11-10 MED ORDER — EMPAGLIFLOZIN 10 MG PO TABS: 10.0000 mg | ORAL_TABLET | Freq: Every day | ORAL | 11 refills | Status: DC

## 2023-11-10 MED ORDER — INSULIN GLARGINE 100 UNIT/ML SOLOSTAR PEN: 22.0000 [IU] | PEN_INJECTOR | Freq: Every day | SUBCUTANEOUS | Status: DC

## 2023-11-10 NOTE — Progress Notes (Signed)
 Reviewed and agree with Dr Macky Lower plan.

## 2023-11-10 NOTE — Progress Notes (Signed)
 S:     Chief Complaint  Patient presents with   Medication Management    Diabetes   63 y.o. female who presents for diabetes evaluation, education, and management. Patient arrives in good spirits and presents without any assistance.  Patient was referred and last seen by Primary Care Provider, Dr. Stoney, on 08/26/2023.   PMH is significant for recent vaginal yeast infection.  At last visit, via phone Farxiga  was HELD - she has not taken since yeast infection.   Current diabetes medications include: Basaglar /Lantus  20 units once daily in the AM. Current hypertension medications include: Atenolol /chlorthalidone  Current hyperlipidemia medications include: rosuvastatin   Patient reports adherence to taking all medications as prescribed.  Except has not taken Farxiga .  Do you feel that your medications are working for you? yes Insurance coverage: Medicaid  Patient denies hypoglycemic events.  Reported home fasting blood sugars: 150 - 250   Reports moving back to [place] - close to her sister.   O:   Review of Systems  All other systems reviewed and are negative.   Physical Exam Vitals reviewed.  Constitutional:      Appearance: Normal appearance.  Pulmonary:     Effort: Pulmonary effort is normal.  Neurological:     Mental Status: She is alert. Mental status is at baseline.  Psychiatric:        Mood and Affect: Mood normal.        Behavior: Behavior normal.        Thought Content: Thought content normal.    Lab Results  Component Value Date   HGBA1C 7.2 (A) 05/18/2023   Vitals:   11/10/23 1015  BP: 119/79  Pulse: 69  SpO2: 100%    Lipid Panel     Component Value Date/Time   CHOL 133 09/05/2023 1259   CHOL 122 06/29/2021 1022   TRIG 132 09/05/2023 1259   TRIG 107 06/29/2021 1022   HDL 50 09/05/2023 1259   CHOLHDL 2.7 09/05/2023 1259   CHOLHDL 3.6 04/12/2016 0953   VLDL 24 04/12/2016 0953   LDLCALC 60 09/05/2023 1259   LDLCALC 95 10/05/2012  1817    Clinical Atherosclerotic Cardiovascular Disease (ASCVD):  The 10-year ASCVD risk score (Arnett DK, et al., 2019) is: 8.1%   Values used to calculate the score:     Age: 31 years     Sex: Female     Is Non-Hispanic African American: No     Diabetic: Yes     Tobacco smoker: No     Systolic Blood Pressure: 119 mmHg     Is BP treated: Yes     HDL Cholesterol: 50 mg/dL     Total Cholesterol: 133 mg/dL   Patient is participating in a Managed Medicaid Plan:  Yes   A/P: Diabetes longstanding and recently held SGLT-2 Farxiga  (dapagliflozin ) due to yeast infection. Patient rports improved glucose readings recently after increasing insulin  back to ~ 20 units daily. She is able to verbalize appropriate hypoglycemia management plan. Medication adherence appears good.  -Increased dose of basal insulin  Lantus  (insulin  glargine) from 20 to 22 units once daily.   -Restarted SGLT2-I Jardiance  (empagliflozin ) 10 mg. Patient reports doing well on this agent in the past.  -Patient educated on purpose, proper use, and potential adverse effects. -Extensively discussed pathophysiology of diabetes, recommended lifestyle interventions, dietary effects on blood sugar control.  Not interested in considering CGM at this time.  Requested new glucose meter and supplies. - New prescriptions provided.  -Counseled on  s/sx of and management of hypoglycemia.   Shortness of breath - Previous smoker.  Concerned that she may need an inhaler again - has used in the past.  Will discuss with PCP and if agreeable, PFT at next Rx visit.   Written patient instructions provided. Patient verbalized understanding of treatment plan.  Total time in face to face counseling 27 minutes.    Follow-up:  Pharmacist 12/14/2023 PCP clinic visit in TBD Patient seen with Owens Cowing, PharmD Candidate.

## 2023-11-10 NOTE — Assessment & Plan Note (Signed)
 Diabetes longstanding and recently held SGLT-2 Farxiga  (dapagliflozin ) due to yeast infection. Patient rports improved glucose readings recently after increasing insulin  back to ~ 20 units daily. She is able to verbalize appropriate hypoglycemia management plan. Medication adherence appears good.  -Increased dose of basal insulin  Lantus  (insulin  glargine) from 20 to 22 units once daily.   -Restarted SGLT2-I Jardiance  (empagliflozin ) 10 mg. Patient reports doing well on this agent in the past.  -Patient educated on purpose, proper use, and potential adverse effects. -Extensively discussed pathophysiology of diabetes, recommended lifestyle interventions, dietary effects on blood sugar control.  Not interested in considering CGM at this time.  Requested new glucose meter and supplies. - New prescriptions provided.  -Counseled on s/sx of and management of hypoglycemia.

## 2023-11-10 NOTE — Patient Instructions (Signed)
 It was nice to see you today!  Your goal blood sugar is 80-130 before eating and less than 180 after eating.  Medication Changes: START  JARDIANCE  10mg  once daily  Increase Lantus  (Basaglar ) - 22 units once daily  Continue all other medication the same.   Monitor blood sugars at home and keep a log (glucometer or piece of paper) to bring with you to your next visit.  Keep up the good work with diet and exercise. Aim for a diet full of vegetables, fruit and lean meats (chicken, turkey, fish). Try to limit salt intake by eating fresh or frozen vegetables (instead of canned), rinse canned vegetables prior to cooking and do not add any additional salt to meals.

## 2023-11-17 ENCOUNTER — Telehealth: Payer: Self-pay

## 2023-11-17 ENCOUNTER — Other Ambulatory Visit (HOSPITAL_COMMUNITY): Payer: Self-pay

## 2023-11-17 NOTE — Telephone Encounter (Signed)
Pharmacy Patient Advocate Encounter   Received notification from Pt Calls Messages that prior authorization for JARDIANCE is required/requested.   Insurance verification completed.   The patient is insured through [employer] .   Per test claim: PA required; PA submitted to above mentioned insurance via CoverMyMeds Key/confirmation #/[facility]. Status is pending

## 2023-11-17 NOTE — Telephone Encounter (Signed)
PA started.   [facility]  New encounter created.

## 2023-11-17 NOTE — Telephone Encounter (Signed)
Patient calls nurse line in regards to [place].   She reports a prior authorization is needed.   Will forward to pharmacy team for assistance.

## 2023-11-18 NOTE — Telephone Encounter (Signed)
Pharmacy Patient Advocate Encounter  Received notification from [facility] that Prior Authorization for JARDIANCE has been APPROVED from 11/17/23 to 11/16/24

## 2023-11-18 NOTE — Telephone Encounter (Signed)
Patient contacted for follow-up of Jardiance - PA completion.   Patient was appreciative of the communication.  She plans to contact her pharmacy and obtain drug.    Total time with patient call and documentation of interaction: 5 minutes.

## 2023-11-22 ENCOUNTER — Ambulatory Visit: Payer: Medicaid Other | Admitting: Podiatry

## 2023-12-05 ENCOUNTER — Ambulatory Visit (INDEPENDENT_AMBULATORY_CARE_PROVIDER_SITE_OTHER): Payer: Medicaid Other | Admitting: Podiatry

## 2023-12-05 ENCOUNTER — Encounter: Payer: Self-pay | Admitting: Podiatry

## 2023-12-05 VITALS — Ht 62.5 in

## 2023-12-05 DIAGNOSIS — L84 Corns and callosities: Secondary | ICD-10-CM

## 2023-12-05 DIAGNOSIS — M79675 Pain in left toe(s): Secondary | ICD-10-CM

## 2023-12-05 DIAGNOSIS — M79674 Pain in right toe(s): Secondary | ICD-10-CM | POA: Diagnosis not present

## 2023-12-05 DIAGNOSIS — B351 Tinea unguium: Secondary | ICD-10-CM | POA: Diagnosis not present

## 2023-12-05 DIAGNOSIS — E1142 Type 2 diabetes mellitus with diabetic polyneuropathy: Secondary | ICD-10-CM | POA: Diagnosis not present

## 2023-12-05 NOTE — Progress Notes (Signed)
This patient returns to my office for at risk foot care.  This patient requires this care by a professional since this patient will be at risk due to having diabetes.  This patient is unable to cut nails herself since the patient cannot reach her nails.These nails are painful walking and wearing shoes.  She also states she has painful callus on the outside right foot. This patient presents for at risk foot care today.  General Appearance  Alert, conversant and in no acute stress.  Vascular  Dorsalis pedis and posterior tibial  pulses are palpable  bilaterally.  Capillary return is within normal limits  bilaterally. Temperature is within normal limits  bilaterally.  Neurologic  Senn-Weinstein monofilament wire test within normal limits  bilaterally. Muscle power within normal limits bilaterally.  Nails Thick disfigured discolored nails with subungual debris  from second to fifth toes bilaterally. No evidence of bacterial infection or drainage bilaterally.  Orthopedic  No limitations of motion  feet .  No crepitus or effusions noted.  No bony pathology or digital deformities noted.  Skin  normotropic skin with no porokeratosis noted bilaterally.  No signs of infections or ulcers noted.     Onychomycosis  Pain in right toes  Pain in left toes  Consent was obtained for treatment procedures.   Mechanical debridement of nails 2-5  bilaterally performed with a nail nipper.  Filed with dremel without incident.    Return office visit   4 months                   Told patient to return for periodic foot care and evaluation due to potential at risk complications.   Helane Gunther DPM

## 2023-12-14 ENCOUNTER — Encounter: Payer: Self-pay | Admitting: Pharmacist

## 2023-12-14 ENCOUNTER — Ambulatory Visit: Payer: Medicaid Other | Admitting: Pharmacist

## 2023-12-14 VITALS — BP 130/71 | HR 66 | Ht 62.75 in | Wt 220.0 lb

## 2023-12-14 DIAGNOSIS — Z794 Long term (current) use of insulin: Secondary | ICD-10-CM | POA: Diagnosis not present

## 2023-12-14 DIAGNOSIS — Z23 Encounter for immunization: Secondary | ICD-10-CM

## 2023-12-14 DIAGNOSIS — J449 Chronic obstructive pulmonary disease, unspecified: Secondary | ICD-10-CM | POA: Diagnosis not present

## 2023-12-14 DIAGNOSIS — E119 Type 2 diabetes mellitus without complications: Secondary | ICD-10-CM

## 2023-12-14 MED ORDER — SPIRIVA RESPIMAT 2.5 MCG/ACT IN AERS: 2.0000 | INHALATION_SPRAY | Freq: Every day | RESPIRATORY_TRACT | 11 refills | Status: DC

## 2023-12-14 MED ORDER — RELION PEN NEEDLES 32G X 4 MM MISC: 5 refills | Status: DC

## 2023-12-14 NOTE — Progress Notes (Signed)
Reviewed and agree with Dr Macky Lower plan.

## 2023-12-14 NOTE — Progress Notes (Signed)
   S:      Chief Complaint  Patient presents with   Medication Management    Diabetes - PFT   63 y.o. female who presents for diabetes and lung function evaluation, education, and management. Patient arrives in good spirits and presents without any assistance.   Patient was referred and last seen by Primary Care Provider, Dr. Rexene Alberts, on 08/25/2024.   PMH is significant for Diabetes, Lung Cancer and History of Tobacco Use - quit in 2019.  At last visit, patient access for restarting Jardiance (empagliflozin) was resolved with help of [employer], CPhT.   Patient reports breathing has been been more challenging over the years.  She reports her breathing is not acutely different today.  She states her breathing is at "baseline".   Medication adherence reported Good  O: Review of Systems  Respiratory:  Positive for cough, sputum production and shortness of breath.   Musculoskeletal:  Positive for joint pain (left hand - index finger pain and swelling intermittently).  All other systems reviewed and are negative.   Physical Exam Vitals reviewed.  Constitutional:      Appearance: Normal appearance.  Pulmonary:     Effort: Pulmonary effort is normal.  Neurological:     Mental Status: She is alert.  Psychiatric:        Mood and Affect: Mood normal.        Behavior: Behavior normal.        Thought Content: Thought content normal.        Judgment: Judgment normal.     Vitals:   12/14/23 0930  BP: 130/71  Pulse: 66  SpO2: 96%   See Documentation Flowsheet   See "scanned report" or Documentation Flowsheet (discrete results - PFTs) for Spirometry results. Patient provided good effort while attempting spirometry.   Lung Age = 39 Albuterol Neb  Lot# [id number]     Exp. 08/2024  Patient is participating in a Managed Medicaid Plan:  Yes   A/P: Diabetes longstanding and recently improved glucose readings after restarting Jardiance (empagliflozin) in combination with  insulin. She is able to verbalize appropriate hypoglycemia management plan. Medication adherence appears good.  -Continue basal insulin Lantus (insulin glargine) at 22 units once daily.   -Continue SGLT2-I Jardiance (empagliflozin) 10 mg. Patient reports doing well on this agent.  -Refill of insulin pen needles provided.   History of tobacco use disorder X 36 years, lung cancer and shortness of breath.  Abstinent from smoking for > 5 years.  Previous spirometry showed diminished lung function as may be expected with history of lung cancer and tobacco use. Patient has been experiencing shortness of while not taking any inhaler. Spirometry evaluation, pre-post nebulized albuterol tx, revealed moderate-severe non-reversible obstructive lung disease.  -Initiated Spiriva (tiotropium) respimat 2 inhalations daily. Patient educated on purpose, proper use and potential adverse effects of dry mouth. Following instruction patient verbalized understanding of treatment plan and was able to demonstrate appropriate technique with use of demonstration device. *patient has planned long-term follow-up for history of lung cancer - planned for 06/2024    Written patient instructions provided.   Total time in face to face counseling 47 minutes.    Follow-up:  Pharmacist None planned PCP clinic visit 01/16/2024

## 2023-12-14 NOTE — Patient Instructions (Signed)
It was nice to see you today!  Your lung function test showed obstruction and we are staring a once daily inhaler medication.   Medication Changes: START Spiriva (tiotropium) - 2 Puffs once daily  Continue all other medication the same.   Monitor blood sugars at home and keep a log (glucometer or piece of paper) to bring with you to your next visit.  Keep up the good work with diet and exercise. Aim for a diet full of vegetables, fruit and lean meats (chicken, [place], fish). Try to limit salt intake by eating fresh or frozen vegetables (instead of canned), rinse canned vegetables prior to cooking and do not add any additional salt to meals.

## 2023-12-14 NOTE — Assessment & Plan Note (Signed)
Diabetes longstanding and recently improved glucose readings after restarting Jardiance (empagliflozin) in combination with insulin. She is able to verbalize appropriate hypoglycemia management plan. Medication adherence appears good.  -Continue basal insulin Lantus (insulin glargine) at 22 units once daily.   -Continue SGLT2-I Jardiance (empagliflozin) 10 mg. Patient reports doing well on this agent.  -Refill of insulin pen needles provided.

## 2023-12-14 NOTE — Assessment & Plan Note (Signed)
History of tobacco use disorder X 36 years, lung cancer and shortness of breath.  Abstinent from smoking for > 5 years.  Previous spirometry showed diminished lung function as may be expected with history of lung cancer and tobacco use. Patient has been experiencing shortness of while not taking any inhaler. Spirometry evaluation, pre-post nebulized albuterol tx, revealed moderate-severe non-reversible obstructive lung disease.  -Initiated Spiriva (tiotropium) respimat 2 inhalations daily. Patient educated on purpose, proper use and potential adverse effects of dry mouth. Following instruction patient verbalized understanding of treatment plan and was able to demonstrate appropriate technique with use of demonstration device.

## 2023-12-19 NOTE — H&P (Signed)
  Patient: Sonya Walton  PID: [id number]  DOB: 1961/05/03  SEX: Female   Patient referred by DDS for extraction teeth 21, 22, 23, 24, 25, 26, 27, and 28.  CC: Painful teeth off and on  Past Medical History:  High Blood Pressure, Bronchitis, Hay Fever or Sinus Problems, Snoring, Lung trouble, Fatty Liver, Thyroid nodules, Diabetes, High Cholesterol, Arthritis, Tumor or Growth, CAD, Morbid Obesity    Medications: Acetaminophen, Aspirin, Atenolol, Certizine, Empagliflozin, Insulin Glargine, Nitroglycerin, Potassium Chloride, Premarin, Rosuvastatin, Vitamin D3    Allergies:     Percocet    Surgeries:   Lung removal, gallbladder removal, Foot surgery, Hysterectomy     Social History       Smoking: former            Alcohol: Drug use:                             Exam: BMI  40. Edentulous maxilla. Mandibular teeth # 21-28 with bone loss, decay. No purulence, edema, fluctuance, trismus. Oral cancer screening negative. Pharynx clear. No lymphadenopathy.  Panorex:Edentulous maxilla. Mandibular teeth # 21-28 with bone loss, decay.   Assessment: ASA 3. Non-restorable  teeth 21, 22, 23, 24, 25, 26, 27, and 28.             Plan: Extraction Teeth #   21, 22, 23, 24, 25, 26, 27, and 28, Alveoloplasty. Hospital Day surgery.                 Rx: n               Risks and complications explained. Questions answered.   [name], DMD

## 2023-12-22 ENCOUNTER — Encounter (HOSPITAL_COMMUNITY): Payer: Self-pay | Admitting: Oral Surgery

## 2023-12-22 ENCOUNTER — Other Ambulatory Visit: Payer: Self-pay

## 2023-12-22 NOTE — Progress Notes (Signed)
Anesthesia Chart Review: Same day workup  63 yo female follows with cardiology for hx of HTN, HLD, and nonobstructive CAD bt CTA 01/2021. Echo 01/2021 showed normal biventricular function, no significant valvular abnormalities. Last seen by Dr. Elease Hashimoto 09/05/23, stable, no changes to management.   History of smoking, quit 2019, with associated COPD and lung cancer s/p RULobectomy 08/2021. Seen by clinical pharmacist Madelon Lips, RPH-CPP on 12/14/23. Per note, " Spirometry evaluation, pre-post nebulized albuterol tx, revealed moderate-severe non-reversible obstructive lung disease.  -Initiated Spiriva (tiotropium) respimat 2 inhalations daily. 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  Pt will need DOS labs and eval.  EKG 09/05/23: NSR. Rate 73.  TTE 02/26/21: 1. Left ventricular ejection fraction, by estimation, is 55 to 60%. The  left ventricle has normal function. The left ventricle has no regional  wall motion abnormalities. The left ventricular internal cavity size was  mildly dilated. Left ventricular  diastolic parameters were normal.   2. Right ventricular systolic function is normal. The right ventricular  size is normal. There is normal pulmonary artery systolic pressure.   3. Left atrial size was mildly dilated.   4. The mitral valve is normal in structure. Trivial mitral valve  regurgitation. No evidence of mitral stenosis.   5. The aortic valve is tricuspid. Aortic valve regurgitation is not  visualized. No aortic stenosis is present.     Zannie Cove [facility] Short Stay Center/Anesthesiology Phone [phone] 12/22/2023 10:33 AM

## 2023-12-22 NOTE — Progress Notes (Signed)
patient voiced understanding of new arrival time of 0530 tomorrow

## 2023-12-22 NOTE — Progress Notes (Signed)
SDW CALL  Patient was given pre-op instructions over the phone. The opportunity was given for the patient to ask questions. No further questions asked. Patient verbalized understanding of instructions given.   PCP - Linda Hedges  Cardiologist - Jannette Spanner  PPM/ICD - denies Device Orders -  Rep Notified -   Chest x-ray - CT 06/22/23 EKG - 09/05/23 Stress Test - 01/11/12 ECHO - 02/26/21 Cardiac Cath - denies  Sleep Study - denies CPAP -   Fasting Blood Sugar - 130's Checks Blood Sugar once_times a day  Blood Thinner Instructions:na Aspirin Instructions:Last dose was 2/20. Do not take DOS. ERAS Protcol -no PRE-SURGERY Ensure or G2-   COVID TEST- na   Anesthesia review: yes- hx HTN,DM,rt upper lobectomy for lung cancer  Patient denies shortness of breath, fever, cough and chest pain over the phone call    Surgical Instructions    Your procedure is scheduled on February 21  Report to [facility] Main Entrance "A" at 0745 A.M., then check in with the Admitting office.  Call this number if you have problems the morning of surgery:  [phone]    Remember:  Do not eat or drink anything after midnight the night before your surgery    Take these medicines the morning of surgery with A SIP OF WATER: Crestor,Spiriva. Prn- Tylenol, Nitroglycerin-if you have to take nitroglycerin before surgery,please call [phone] to notify pre op staff.    As of today, STOP taking any Aspirin (unless otherwise instructed by your surgeon) Aleve, Naproxen, Ibuprofen, Motrin, Advil, Goody's, BC's, all herbal medications, fish oil, and all vitamins.  WHAT DO I DO ABOUT MY DIABETES MEDICATION?   Do not take oral diabetes medicines (pills) the morning of surgery. DO NOT TAKE JARDIANCE TODAY OR THE MORNING OF SURGERY.    THE MORNING OF SURGERY, take 11 units of glargine(Lantus)insulin.   Check your blood sugar the morning of your surgery when you wake up and every 2 hours until  you get to the Short Stay unit.  If your blood sugar is less than 70 mg/dL, you will need to treat for low blood sugar: Do not take insulin. Treat a low blood sugar (less than 70 mg/dL) with  cup of clear juice (cranberry or apple), 4 glucose tablets, OR glucose gel. Recheck blood sugar in 15 minutes after treatment (to make sure it is greater than 70 mg/dL). If your blood sugar is not greater than 70 mg/dL on recheck, call [phone] for further instructions. Report your blood sugar to the short stay nurse when you get to Short Stay.  [facility] is not responsible for any belongings or valuables. .   Do NOT Smoke (Tobacco/Vaping)  24 hours prior to your procedure  If you use a CPAP at night, you may bring your mask for your overnight stay.   Contacts, glasses, hearing aids, dentures or partials may not be worn into surgery, please bring cases for these belongings   Patients discharged the day of surgery will not be allowed to drive home, and someone needs to stay with them for 24 hours.   Special instructions:    Oral Hygiene is also important to reduce your risk of infection.  Remember - BRUSH YOUR TEETH THE MORNING OF SURGERY WITH YOUR REGULAR TOOTHPASTE   Day of Surgery:  Take a shower the day of or night before with antibacterial soap. Wear Clean/Comfortable clothing the morning of surgery Do not apply any deodorants/lotions.   Do not wear jewelry or  makeup Do not wear lotions, powders, perfumes/colognes, or deodorant. Do not shave 48 hours prior to surgery.  Men may shave face and neck. Do not bring valuables to the hospital. Do not wear nail polish, gel polish, artificial nails, or any other type of covering on natural nails (fingers and toes) If you have artificial nails or gel coating that need to be removed by a nail salon, please have this removed prior to surgery. Artificial nails or gel coating may interfere with anesthesia's ability to adequately monitor your vital  signs. Remember to brush your teeth WITH YOUR REGULAR TOOTHPASTE.

## 2023-12-22 NOTE — Anesthesia Preprocedure Evaluation (Addendum)
Anesthesia Evaluation  Patient identified by MRN, date of birth, ID band Patient awake    Reviewed: Allergy & Precautions, NPO status , Patient's Chart, lab work & pertinent test results, reviewed documented beta blocker date and time   History of Anesthesia Complications Negative for: history of anesthetic complications  Airway Mallampati: III  TM Distance: >3 FB Neck ROM: Full   Comment: Previous grade II view with MAC 4 Dental  (+) Dental Advisory Given   Pulmonary neg shortness of breath, neg sleep apnea, COPD (used inhaler this morning),  COPD inhaler, neg recent URI, former smoker Hx/o lung Ca S/P VATS RLLobectomy   Pulmonary exam normal breath sounds clear to auscultation       Cardiovascular hypertension (atenolol-chlorthalidone), Pt. on medications and Pt. on home beta blockers (-) angina + CAD (non-obstructive)  (-) Past MI, (-) Cardiac Stents and (-) CABG  Rhythm:Regular Rate:Normal  HLD  TTE 02/26/2021: IMPRESSIONS    1. Left ventricular ejection fraction, by estimation, is 55 to 60%. The  left ventricle has normal function. The left ventricle has no regional  wall motion abnormalities. The left ventricular internal cavity size was  mildly dilated. Left ventricular  diastolic parameters were normal.   2. Right ventricular systolic function is normal. The right ventricular  size is normal. There is normal pulmonary artery systolic pressure.   3. Left atrial size was mildly dilated.   4. The mitral valve is normal in structure. Trivial mitral valve  regurgitation. No evidence of mitral stenosis.   5. The aortic valve is tricuspid. Aortic valve regurgitation is not  visualized. No aortic stenosis is present.     Neuro/Psych negative neurological ROS  negative psych ROS   GI/Hepatic negative GI ROS, Neg liver ROS,,,Dental caries   Endo/Other  diabetes, Well Controlled, Type 2, Insulin Dependent  Class 3  obesityHyperlipidemia  Renal/GU negative Renal ROS     Musculoskeletal  (+) Arthritis , Osteoarthritis,    Abdominal  (+) + obese  Peds  Hematology negative hematology ROS (+) Lab Results      Component                Value               Date                      WBC                      7.8                 06/13/2023                HGB                      16.5 (H)            06/13/2023                HCT                      50.4 (H)            06/13/2023                MCV                      86.3  06/13/2023                PLT                      214                 06/13/2023              Anesthesia Other Findings   Reproductive/Obstetrics                             Anesthesia Physical Anesthesia Plan  ASA: 3  Anesthesia Plan: General   Post-op Pain Management: Minimal or no pain anticipated   Induction: Intravenous  PONV Risk Score and Plan: 4 or greater and Treatment may vary due to age or medical condition, Ondansetron, Scopolamine patch - Pre-op and Dexamethasone  Airway Management Planned: Nasal ETT and Video Laryngoscope Planned  Additional Equipment: None  Intra-op Plan:   Post-operative Plan: Extubation in OR  Informed Consent: I have reviewed the patients History and Physical, chart, labs and discussed the procedure including the risks, benefits and alternatives for the proposed anesthesia with the patient or authorized representative who has indicated his/her understanding and acceptance.     Dental advisory given  Plan Discussed with: CRNA and Anesthesiologist  Anesthesia Plan Comments: (Risks of general anesthesia discussed including, but not limited to, sore throat, hoarse voice, chipped/damaged teeth, injury to vocal cords, nausea and vomiting, allergic reactions, lung infection, heart attack, stroke, and death. All questions answered.  PAT note by Antionette Poles, PA-C: 63 yo female follows with  cardiology for hx of HTN, HLD, and nonobstructive CAD bt CTA 01/2021. Echo 01/2021 showed normal biventricular function, no significant valvular abnormalities. Last seen by Dr. Elease Hashimoto 09/05/23, stable, no changes to management.   History of smoking, quit 2019, with associated COPD and lung cancer s/p RULobectomy 08/2021. Seen by clinical pharmacist Madelon Lips, RPH-CPP on 12/14/23. Per note, " Spirometry evaluation, pre-post nebulized albuterol tx, revealed moderate-severe non-reversible obstructive lung disease.  -Initiated Spiriva (tiotropium) respimat 2 inhalations daily. 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  Pt will need DOS labs and eval.  EKG 09/05/23: NSR. Rate 73.  TTE 02/26/21: 1. Left ventricular ejection fraction, by estimation, is 55 to 60%. The  left ventricle has normal function. The left ventricle has no regional  wall motion abnormalities. The left ventricular internal cavity size was  mildly dilated. Left ventricular  diastolic parameters were normal.  2. Right ventricular systolic function is normal. The right ventricular  size is normal. There is normal pulmonary artery systolic pressure.  3. Left atrial size was mildly dilated.  4. The mitral valve is normal in structure. Trivial mitral valve  regurgitation. No evidence of mitral stenosis.  5. The aortic valve is tricuspid. Aortic valve regurgitation is not  visualized. No aortic stenosis is present.   )        Anesthesia Quick Evaluation

## 2023-12-23 ENCOUNTER — Ambulatory Visit (HOSPITAL_BASED_OUTPATIENT_CLINIC_OR_DEPARTMENT_OTHER): Payer: Self-pay | Admitting: Physician Assistant

## 2023-12-23 ENCOUNTER — Ambulatory Visit (HOSPITAL_COMMUNITY): Payer: Self-pay | Admitting: Physician Assistant

## 2023-12-23 ENCOUNTER — Encounter (HOSPITAL_COMMUNITY): Payer: Self-pay | Admitting: Oral Surgery

## 2023-12-23 ENCOUNTER — Other Ambulatory Visit: Payer: Self-pay

## 2023-12-23 ENCOUNTER — Encounter (HOSPITAL_COMMUNITY)
Admission: RE | Disposition: A | Discharge status: Home / Self Care | Payer: Self-pay | Source: Home / Self Care | Attending: Oral Surgery

## 2023-12-23 ENCOUNTER — Ambulatory Visit (HOSPITAL_COMMUNITY)
Admission: RE | Admit: 2023-12-23 | Discharge: 2023-12-23 | Disposition: A | Discharge status: Home / Self Care | Payer: Medicaid Other | Attending: Oral Surgery | Admitting: Oral Surgery

## 2023-12-23 DIAGNOSIS — Z794 Long term (current) use of insulin: Secondary | ICD-10-CM | POA: Insufficient documentation

## 2023-12-23 DIAGNOSIS — K029 Dental caries, unspecified: Secondary | ICD-10-CM

## 2023-12-23 DIAGNOSIS — Z79899 Other long term (current) drug therapy: Secondary | ICD-10-CM | POA: Diagnosis not present

## 2023-12-23 DIAGNOSIS — Z87891 Personal history of nicotine dependence: Secondary | ICD-10-CM | POA: Insufficient documentation

## 2023-12-23 DIAGNOSIS — I2510 Atherosclerotic heart disease of native coronary artery without angina pectoris: Secondary | ICD-10-CM

## 2023-12-23 DIAGNOSIS — E119 Type 2 diabetes mellitus without complications: Secondary | ICD-10-CM | POA: Diagnosis not present

## 2023-12-23 DIAGNOSIS — I10 Essential (primary) hypertension: Secondary | ICD-10-CM | POA: Insufficient documentation

## 2023-12-23 DIAGNOSIS — E7800 Pure hypercholesterolemia, unspecified: Secondary | ICD-10-CM | POA: Insufficient documentation

## 2023-12-23 HISTORY — DX: Malignant (primary) neoplasm, unspecified: C80.1

## 2023-12-23 HISTORY — PX: TOOTH EXTRACTION: SHX859

## 2023-12-23 LAB — CBC
HCT: 47.4 % — ABNORMAL HIGH (ref 36.0–46.0)
Hemoglobin: 15.8 g/dL — ABNORMAL HIGH (ref 12.0–15.0)
MCH: 29.4 pg (ref 26.0–34.0)
MCHC: 33.3 g/dL (ref 30.0–36.0)
MCV: 88.3 fL (ref 80.0–100.0)
Platelets: 186 10*3/uL (ref 150–400)
RBC: 5.37 MIL/uL — ABNORMAL HIGH (ref 3.87–5.11)
RDW: 14.7 % (ref 11.5–15.5)
WBC: 7.2 10*3/uL (ref 4.0–10.5)
nRBC: 0 % (ref 0.0–0.2)

## 2023-12-23 LAB — BASIC METABOLIC PANEL
Anion gap: 11 (ref 5–15)
BUN: 15 mg/dL (ref 8–23)
CO2: 26 mmol/L (ref 22–32)
Calcium: 9.7 mg/dL (ref 8.9–10.3)
Chloride: 101 mmol/L (ref 98–111)
Creatinine, Ser: 0.69 mg/dL (ref 0.44–1.00)
GFR, Estimated: >60 mL/min (ref >60)
Glucose, Bld: 150 mg/dL — ABNORMAL HIGH (ref 70–99)
Potassium: 3.2 mmol/L — ABNORMAL LOW (ref 3.5–5.1)
Sodium: 138 mmol/L (ref 135–145)

## 2023-12-23 LAB — GLUCOSE, CAPILLARY
Glucose-Capillary: 150 mg/dL — ABNORMAL HIGH (ref 70–99)
Glucose-Capillary: 181 mg/dL — ABNORMAL HIGH (ref 70–99)

## 2023-12-23 SURGERY — DENTAL RESTORATION/EXTRACTIONS
Anesthesia: General | Site: Mouth

## 2023-12-23 MED ORDER — LIDOCAINE 2% (20 MG/ML) 5 ML SYRINGE
INTRAMUSCULAR | Status: AC
  Filled 2023-12-23: qty 5

## 2023-12-23 MED ORDER — ORAL CARE MOUTH RINSE: 15.0000 mL | Freq: Once | OROMUCOSAL | Status: AC

## 2023-12-23 MED ORDER — FENTANYL CITRATE (PF) 250 MCG/5ML IJ SOLN
INTRAMUSCULAR | Status: AC
  Filled 2023-12-23: qty 5

## 2023-12-23 MED ORDER — PROPOFOL 10 MG/ML IV BOLUS
INTRAVENOUS | Status: DC | PRN
  Administered 2023-12-23: 200 mg via INTRAVENOUS

## 2023-12-23 MED ORDER — AMISULPRIDE (ANTIEMETIC) 5 MG/2ML IV SOLN: 10.0000 mg | Freq: Once | INTRAVENOUS | Status: DC | PRN

## 2023-12-23 MED ORDER — CEFAZOLIN SODIUM-DEXTROSE 2-4 GM/100ML-% IV SOLN
2.0000 g | INTRAVENOUS | Status: AC
  Administered 2023-12-23: 2 g via INTRAVENOUS
  Filled 2023-12-23: qty 100

## 2023-12-23 MED ORDER — PHENYLEPHRINE 80 MCG/ML (10ML) SYRINGE FOR IV PUSH (FOR BLOOD PRESSURE SUPPORT)
PREFILLED_SYRINGE | INTRAVENOUS | Status: DC | PRN
  Administered 2023-12-23 (×2): 80 ug via INTRAVENOUS

## 2023-12-23 MED ORDER — PROPOFOL 10 MG/ML IV BOLUS
INTRAVENOUS | Status: AC
  Filled 2023-12-23: qty 20

## 2023-12-23 MED ORDER — ONDANSETRON HCL 4 MG/2ML IJ SOLN
INTRAMUSCULAR | Status: AC
  Filled 2023-12-23: qty 2

## 2023-12-23 MED ORDER — HYDROCODONE-ACETAMINOPHEN 5-325 MG PO TABS
1.0000 | ORAL_TABLET | ORAL | 0 refills | Status: DC | PRN
Start: 2023-12-23 — End: 2024-01-16

## 2023-12-23 MED ORDER — CHLORHEXIDINE GLUCONATE 0.12 % MT SOLN
15.0000 mL | Freq: Once | OROMUCOSAL | Status: AC
  Administered 2023-12-23: 15 mL via OROMUCOSAL
  Filled 2023-12-23: qty 15

## 2023-12-23 MED ORDER — LIDOCAINE-EPINEPHRINE 2 %-1:100000 IJ SOLN
INTRAMUSCULAR | Status: DC | PRN
  Administered 2023-12-23: 13 mL

## 2023-12-23 MED ORDER — ACETAMINOPHEN 10 MG/ML IV SOLN: 1000.0000 mg | Freq: Once | INTRAVENOUS | Status: DC | PRN

## 2023-12-23 MED ORDER — SUCCINYLCHOLINE CHLORIDE 200 MG/10ML IV SOSY
PREFILLED_SYRINGE | INTRAVENOUS | Status: DC | PRN
Start: 2023-12-23 — End: 2023-12-23
  Administered 2023-12-23: 100 mg via INTRAVENOUS

## 2023-12-23 MED ORDER — DEXAMETHASONE SODIUM PHOSPHATE 10 MG/ML IJ SOLN
INTRAMUSCULAR | Status: DC | PRN
  Administered 2023-12-23: 10 mg via INTRAVENOUS

## 2023-12-23 MED ORDER — AMOXICILLIN 500 MG PO CAPS: 500.0000 mg | ORAL_CAPSULE | Freq: Three times a day (TID) | ORAL | 0 refills | Status: DC

## 2023-12-23 MED ORDER — FENTANYL CITRATE (PF) 100 MCG/2ML IJ SOLN: 25.0000 ug | INTRAMUSCULAR | Status: DC | PRN

## 2023-12-23 MED ORDER — OXYMETAZOLINE HCL 0.05 % NA SOLN
NASAL | Status: AC
  Filled 2023-12-23: qty 30

## 2023-12-23 MED ORDER — MIDAZOLAM HCL 2 MG/2ML IJ SOLN
INTRAMUSCULAR | Status: DC | PRN
  Administered 2023-12-23: 2 mg via INTRAVENOUS

## 2023-12-23 MED ORDER — 0.9 % SODIUM CHLORIDE (POUR BTL) OPTIME
TOPICAL | Status: DC | PRN
  Administered 2023-12-23 (×2): 1000 mL

## 2023-12-23 MED ORDER — LACTATED RINGERS IV SOLN: INTRAVENOUS | Status: DC

## 2023-12-23 MED ORDER — LIDOCAINE 2% (20 MG/ML) 5 ML SYRINGE
INTRAMUSCULAR | Status: DC | PRN
  Administered 2023-12-23: 50 mg via INTRAVENOUS

## 2023-12-23 MED ORDER — MIDAZOLAM HCL 2 MG/2ML IJ SOLN
INTRAMUSCULAR | Status: AC
  Filled 2023-12-23: qty 2

## 2023-12-23 MED ORDER — DEXAMETHASONE SODIUM PHOSPHATE 10 MG/ML IJ SOLN
INTRAMUSCULAR | Status: AC
  Filled 2023-12-23: qty 1

## 2023-12-23 MED ORDER — ONDANSETRON HCL 4 MG/2ML IJ SOLN
INTRAMUSCULAR | Status: DC | PRN
  Administered 2023-12-23: 4 mg via INTRAVENOUS

## 2023-12-23 MED ORDER — LIDOCAINE-EPINEPHRINE 2 %-1:100000 IJ SOLN
INTRAMUSCULAR | Status: AC
Start: 2023-12-23 — End: ?
  Filled 2023-12-23: qty 1

## 2023-12-23 MED ORDER — SUCCINYLCHOLINE CHLORIDE 200 MG/10ML IV SOSY
PREFILLED_SYRINGE | INTRAVENOUS | Status: AC
  Filled 2023-12-23: qty 10

## 2023-12-23 MED ORDER — FENTANYL CITRATE (PF) 250 MCG/5ML IJ SOLN
INTRAMUSCULAR | Status: DC | PRN
Start: 2023-12-23 — End: 2023-12-23
  Administered 2023-12-23: 50 ug via INTRAVENOUS

## 2023-12-23 SURGICAL SUPPLY — 31 items
BAG COUNTER SPONGE SURGICOUNT (BAG) IMPLANT
BLADE SURG 15 STRL LF DISP TIS (BLADE) ×2 IMPLANT
BUR CROSS CUT FISSURE 1.6 (BURR) ×2 IMPLANT
BUR EGG ELITE 4.0 (BURR) ×2 IMPLANT
BUR EGG ELITE 5.0 (BURR) IMPLANT
BUR SURG 4X8 MED (BURR) IMPLANT
BURR SURG 4X8 MED (BURR)
CANISTER SUCT 3000ML PPV (MISCELLANEOUS) ×2 IMPLANT
COVER SURGICAL LIGHT HANDLE (MISCELLANEOUS) ×2 IMPLANT
GAUZE PACKING FOLDED 2 STR (GAUZE/BANDAGES/DRESSINGS) ×2 IMPLANT
GLOVE BIO SURGEON STRL SZ8 (GLOVE) ×2 IMPLANT
GOWN STRL REUS W/ TWL LRG LVL3 (GOWN DISPOSABLE) ×2 IMPLANT
GOWN STRL REUS W/ TWL XL LVL3 (GOWN DISPOSABLE) ×2 IMPLANT
IV NS 1000ML BAXH (IV SOLUTION) ×2 IMPLANT
KIT BASIN OR (CUSTOM PROCEDURE TRAY) ×2 IMPLANT
KIT TURNOVER KIT B (KITS) ×2 IMPLANT
NDL HYPO 25GX1X1/2 BEV (NEEDLE) ×4 IMPLANT
NEEDLE HYPO 25GX1X1/2 BEV (NEEDLE) ×2 IMPLANT
NS IRRIG 1000ML POUR BTL (IV SOLUTION) ×2 IMPLANT
PAD ARMBOARD 7.5X6 YLW CONV (MISCELLANEOUS) ×2 IMPLANT
SLEEVE IRRIGATION ELITE 7 (MISCELLANEOUS) ×2 IMPLANT
SPIKE FLUID TRANSFER (MISCELLANEOUS) ×2 IMPLANT
SPONGE SURGIFOAM ABS GEL 12-7 (HEMOSTASIS) IMPLANT
SUT MNCRL 4-0 27 PS-2 XMFL (SUTURE) ×1
SUT PLAIN 3 0 PS2 27 (SUTURE) ×2 IMPLANT
SUTURE MNCRL 4-0 27XMF (SUTURE) IMPLANT
SYR BULB IRRIG 60ML STRL (SYRINGE) ×2 IMPLANT
SYR CONTROL 10ML LL (SYRINGE) ×2 IMPLANT
TRAY ENT MC OR (CUSTOM PROCEDURE TRAY) ×2 IMPLANT
TUBING IRRIGATION (MISCELLANEOUS) ×2 IMPLANT
YANKAUER SUCT BULB TIP NO VENT (SUCTIONS) ×2 IMPLANT

## 2023-12-23 NOTE — Op Note (Signed)
12/23/2023  8:15 AM  PATIENT:  Sonya Walton  63 y.o. female  PRE-OPERATIVE DIAGNOSIS:  NON-RESTORABLE TEETH # 21, 22, 23, 24, 25, 26, 27, 28 SECONDARY TO DENTAL CARIES  POST-OPERATIVE DIAGNOSIS:  SAME  PROCEDURE:  Procedure(s): EXTRACTIONS TEETH # 21, 22, 23, 24, 25, 26, 27, 28. ALVEOLOPLASTY  SURGEON:  Surgeon(s): Ocie Doyne, DMD  ANESTHESIA:   local and general  EBL:  minimal  DRAINS: none   SPECIMEN:  No Specimen  COUNTS:  YES  PLAN OF CARE: Discharge to home after PACU  PATIENT DISPOSITION:  PACU - hemodynamically stable.   PROCEDURE DETAILS: Dictation # [id number]  [name], DMD 12/23/2023 8:15 AM

## 2023-12-23 NOTE — Anesthesia Procedure Notes (Signed)
Procedure Name: Intubation Date/Time: 12/23/2023 7:47 AM  Performed by: Bartholomew Crews, CRNAPre-anesthesia Checklist: Patient identified, Emergency Drugs available, Suction available, Timeout performed and Patient being monitored Patient Re-evaluated:Patient Re-evaluated prior to induction Oxygen Delivery Method: Circle system utilized Preoxygenation: Pre-oxygenation with 100% oxygen Induction Type: IV induction Ventilation: Mask ventilation without difficulty Laryngoscope Size: Mac and 4 Grade View: Grade III Tube type: Oral Tube size: 6.5 mm Number of attempts: 1 Airway Equipment and Method: Stylet Placement Confirmation: ETT inserted through vocal cords under direct vision, positive ETCO2 and breath sounds checked- equal and bilateral Secured at: 21 cm Tube secured with: Tape Dental Injury: Teeth and Oropharynx as per pre-operative assessment

## 2023-12-23 NOTE — Anesthesia Postprocedure Evaluation (Signed)
Anesthesia Post Note  Patient: Sonya Walton  Procedure(s) Performed: DENTAL RESTORATION/EXTRACTIONS (Mouth)     Patient location during evaluation: PACU Anesthesia Type: General Level of consciousness: awake Pain management: pain level controlled Vital Signs Assessment: post-procedure vital signs reviewed and stable Respiratory status: spontaneous breathing, nonlabored ventilation and respiratory function stable Cardiovascular status: blood pressure returned to baseline and stable Postop Assessment: no apparent nausea or vomiting Anesthetic complications: no   No notable events documented.  Last Vitals:  Vitals:   12/23/23 0845 12/23/23 0857  BP: 115/61 (!) 117/58  Pulse: 93 90  Resp: 18 20  Temp:    SpO2: 92% 93%    Last Pain:  Vitals:   12/23/23 0857  TempSrc:   PainSc: 0-No pain                 Linton Rump

## 2023-12-23 NOTE — Op Note (Signed)
NAMEJANAY, Sonya Walton MEDICAL RECORD NO: [id number] ACCOUNT NO: [id number] DATE OF BIRTH: April 20, 1961 FACILITY: [facility] LOCATION: [facility] PHYSICIAN: [name], DDS  Operative Report   DATE OF PROCEDURE: 12/23/2023  PREOPERATIVE DIAGNOSES:  Nonrestorable teeth #22, 23, 24, 25, 26, 27, and 28 secondary to dental caries.  POSTOPERATIVE DIAGNOSES:  Nonrestorable teeth #22, 23, 24, 25, 26, 27, and 28 secondary to dental caries.  PROCEDURE: Extraction of teeth #21, 22, 23, 24, 25, 26, 27, and 28. Alveoloplasty, right and left mandible.  SURGEON: [name], DDS  ANESTHESIA: General. Oral intubation Dr. Freida Busman attending.  DESCRIPTION OF PROCEDURE: The patient was taken to the operating room. An oral endotracheal tube was placed and secured. The eyes were protected. The patient was draped for surgery. A timeout was performed. The posterior pharynx was suctioned and a  throat pack was placed. 2% lidocaine, 1:100,000 epinephrine was infiltrated in an inferior alveolar block on the right and left side along with buccal infiltration in the anterior mandible and a total of 13 mL was given. The left side was operated first.  A #15 blade was used to make an incision beginning with tooth #21 and the gingival sulcus and carried forward to tooth #27 in the buccal and lingual gingival sulcus. The periosteum was reflected from around these teeth. The teeth were elevated and  removed with the dental forceps after elevating with a 301 elevator. The tissue was then trimmed to allow for primary closure. The sockets were curetted, irrigated, and then closure was performed with 4-0 plain gut. The bite block was repositioned to the  outside of the mouth. Then, tooth #27 and #28 were removed in similar fashion. The sockets were curetted. The periosteal tissue was reflected to expose buccal undercuts on the alveolus. Alveoloplasty was performed using the egg burr under irrigation.  Then, the incision was closed  with 4-0 plain gut. The bite block was repositioned and the right side was operated. Teeth #27 and #28 were removed using the #15 blade, periosteal elevator, 301 elevator, and dental forceps. The tissue was reflected and  alveoloplasty was performed using the egg burr followed by the bone file. Then, the area was irrigated and closed with 3-0 chromic. The oral cavity was then irrigated and suctioned. The throat pack was removed. The patient was under care of anesthesia  for extubation, transported to recovery, and plans for discharge home through days of surgery.  ESTIMATED BLOOD LOSS: Minimal.  COMPLICATIONS: None.  SPECIMENS: None.  COUNTS: Correct.   No drains.    [email or web address] D: 12/23/2023 8:19:55 am T: 12/23/2023 8:41:00 am  JOB: [id number]/ [id number]

## 2023-12-23 NOTE — H&P (Signed)
 H&P documentation  -History and Physical Reviewed  -Patient has been re-examined  -No change in the plan of care  Sonya Walton

## 2023-12-23 NOTE — Transfer of Care (Signed)
Immediate Anesthesia Transfer of Care Note  Patient: Sonya Walton  Procedure(s) Performed: DENTAL RESTORATION/EXTRACTIONS (Mouth)  Patient Location: PACU  Anesthesia Type:General  Level of Consciousness: sedated  Airway & Oxygen Therapy: Patient Spontanous Breathing  Post-op Assessment: Report given to RN  Post vital signs: Reviewed and stable  Last Vitals:  Vitals Value Taken Time  BP 119/48 12/23/23 0825  Temp 36.9 C 12/23/23 0825  Pulse 93 12/23/23 0829  Resp 18 12/23/23 0829  SpO2 99 % 12/23/23 0829  Vitals shown include unfiled device data.  Last Pain:  Vitals:   12/23/23 0825  TempSrc:   PainSc: Asleep         Complications: No notable events documented.

## 2023-12-24 ENCOUNTER — Encounter (HOSPITAL_COMMUNITY): Payer: Self-pay | Admitting: Oral Surgery

## 2024-01-06 DIAGNOSIS — H524 Presbyopia: Secondary | ICD-10-CM | POA: Diagnosis not present

## 2024-01-16 ENCOUNTER — Encounter: Payer: Self-pay | Admitting: Student

## 2024-01-16 ENCOUNTER — Ambulatory Visit: Payer: Medicaid Other | Admitting: Student

## 2024-01-16 VITALS — BP 110/70 | HR 93 | Ht 62.0 in | Wt 218.1 lb

## 2024-01-16 DIAGNOSIS — E876 Hypokalemia: Secondary | ICD-10-CM | POA: Insufficient documentation

## 2024-01-16 DIAGNOSIS — E785 Hyperlipidemia, unspecified: Secondary | ICD-10-CM | POA: Diagnosis not present

## 2024-01-16 DIAGNOSIS — I10 Essential (primary) hypertension: Secondary | ICD-10-CM

## 2024-01-16 DIAGNOSIS — E119 Type 2 diabetes mellitus without complications: Secondary | ICD-10-CM | POA: Diagnosis not present

## 2024-01-16 DIAGNOSIS — Z794 Long term (current) use of insulin: Secondary | ICD-10-CM | POA: Diagnosis not present

## 2024-01-16 LAB — POCT GLYCOSYLATED HEMOGLOBIN (HGB A1C): HbA1c, POC (controlled diabetic range): 7.5 % — AB (ref 0.0–7.0)

## 2024-01-16 MED ORDER — ROSUVASTATIN CALCIUM 10 MG PO TABS: 10.0000 mg | ORAL_TABLET | Freq: Every day | ORAL | 3 refills | Status: DC

## 2024-01-16 MED ORDER — EMPAGLIFLOZIN 10 MG PO TABS: 10.0000 mg | ORAL_TABLET | Freq: Every day | ORAL | 11 refills | Status: DC

## 2024-01-16 MED ORDER — RELION PEN NEEDLES 32G X 4 MM MISC: 5 refills | Status: AC

## 2024-01-16 MED ORDER — POTASSIUM CHLORIDE CRYS ER 20 MEQ PO TBCR: 40.0000 meq | EXTENDED_RELEASE_TABLET | Freq: Every day | ORAL | 3 refills | Status: DC

## 2024-01-16 NOTE — Assessment & Plan Note (Signed)
 Blood pressure within goal continue current medications of atenolol-chlorthalidone 50-25 mg daily.

## 2024-01-16 NOTE — Patient Instructions (Signed)
 It was great to see you today!   Please follow-up in 3 to 6 months.  Future Appointments  Date Time Provider Department Center  03/05/2024  9:30 AM Helane Gunther, DPM [facility]  06/13/2024 10:00 AM [facility] None  06/14/2024  2:00 PM Si Gaul, MD [facility] None  06/19/2024  2:00 PM Si Gaul, MD [facility] None    Please arrive 15 minutes before your appointment to ensure smooth check in process.    Please call the clinic at [phone] if your symptoms worsen or you have any concerns.  Thank you for allowing me to participate in your care, Dr. [name] [facility]

## 2024-01-16 NOTE — Assessment & Plan Note (Signed)
 Continue Lantus 22 units daily and Jardiance 10 mg daily.  A1c slightly elevated at 7.5 from prior.  With patient's record of home blood sugar checks she is within goal of less than 120 fasting in the morning.  Will continue Lantus at current dose and instructed patient to work on her diet with reducing the amount of glucose and starches she consumes.

## 2024-01-16 NOTE — Progress Notes (Signed)
    SUBJECTIVE:   CHIEF COMPLAINT / HPI:   Sonya Walton is a 63 y.o. female  presenting for   Type 2 Diabetes: Home medications include: Lantus 22 units daily, Jardiance 10 mg. Does endorse compliance. Home glucose monitoring is performed regularly.  Blood sugars at home have been in the low 100s.  She denies episodes of hypoglycemia.  Most recent A1Cs:  Lab Results  Component Value Date   HGBA1C 7.5 (A) 01/16/2024   HGBA1C 7.2 (A) 05/18/2023   Last Microalbumin, LDL, Creatinine: Lab Results  Component Value Date   LDLCALC 60 09/05/2023   CREATININE 0.69 12/23/2023    Patient is up to date on diabetic eye. Patient is up to date on diabetic foot exam.  HLD: Well-controlled on Crestor 10 mg daily.  Patient denies side effects.  Pretension: Patient has been well-controlled on atenolol-chlorthalidone 50-25 mg daily.  She denies side effects of medication.  PERTINENT  PMH / PSH: Reviewed and updated   OBJECTIVE:   BP 110/70   Pulse 93   Ht 5\' 2"  (1.575 m)   Wt 218 lb 2 oz (98.9 kg)   SpO2 95%   BMI 39.90 kg/m   Well-appearing, no acute distress Cardio: Regular rate, regular rhythm, no murmurs on exam. Pulm: Clear, no wheezing, no crackles. No increased work of breathing Abdominal: bowel sounds present, soft, non-tender, non-distended Extremities: no peripheral edema  Neuro: alert and oriented x3, speech normal in content, no facial asymmetry, strength intact and equal bilaterally in UE and LE, pupils equal and reactive to light.  Psych:  Cognition and judgment appear intact. Alert, communicative  and cooperative with normal attention span and concentration. No apparent delusions, illusions, hallucinations      01/16/2024    9:39 AM 08/26/2023    9:18 AM 08/03/2023   10:18 AM  PHQ9 SCORE ONLY  PHQ-9 Total Score 0 0 1      ASSESSMENT/PLAN:   Hypertension Blood pressure within goal continue current medications of atenolol-chlorthalidone 50-25 mg  daily.  Hypokalemia Likely secondary to blood pressure medications.  Patient is currently on 40 mEq daily of potassium.  Last BMP potassium is 3.2.  Recheck BMP today.  HLD (hyperlipidemia) Continue Crestor 10 mg daily.  Per last lipid panel LDL is within goal.  Type 2 diabetes mellitus treated with insulin (HCC) Continue Lantus 22 units daily and Jardiance 10 mg daily.  A1c slightly elevated at 7.5 from prior.  With patient's record of home blood sugar checks she is within goal of less than 120 fasting in the morning.  Will continue Lantus at current dose and instructed patient to work on her diet with reducing the amount of glucose and starches she consumes.     [name], DO [facility]

## 2024-01-16 NOTE — Assessment & Plan Note (Signed)
 Likely secondary to blood pressure medications.  Patient is currently on 40 mEq daily of potassium.  Last BMP potassium is 3.2.  Recheck BMP today.

## 2024-01-16 NOTE — Assessment & Plan Note (Signed)
 Continue Crestor 10 mg daily.  Per last lipid panel LDL is within goal.

## 2024-01-17 ENCOUNTER — Encounter: Payer: Self-pay | Admitting: Student

## 2024-01-17 LAB — MICROALBUMIN / CREATININE URINE RATIO
Creatinine, Urine: 29.4 mg/dL
Microalb/Creat Ratio: <10 mg/g{creat} (ref 0–29)
Microalbumin, Urine: <3 ug/mL

## 2024-01-17 LAB — BASIC METABOLIC PANEL
BUN/Creatinine Ratio: 19 (ref 12–28)
BUN: 13 mg/dL (ref 8–27)
CO2: 25 mmol/L (ref 20–29)
Calcium: 9.5 mg/dL (ref 8.7–10.3)
Chloride: 100 mmol/L (ref 96–106)
Creatinine, Ser: 0.69 mg/dL (ref 0.57–1.00)
Glucose: 135 mg/dL — ABNORMAL HIGH (ref 70–99)
Potassium: 3.7 mmol/L (ref 3.5–5.2)
Sodium: 142 mmol/L (ref 134–144)
eGFR: 97 mL/min/{1.73_m2} (ref >59)

## 2024-02-24 NOTE — Progress Notes (Signed)
 02/27/2024 Sonya Walton [id number] 11/13/1960  Referring provider: Clem Currier, DO Primary GI doctor: Dr. 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 pylori gastritis recall 2 years. Not on prophylaxis  - will schedule with colon in Oct, will do follow up OV in Sept and schedule then  Hepatic encephalopathy:  No evidence on exam today Most recent HCC screening:    US  on 06/2021 without reported focal liver lesions.   Will get repeat ultrasound and AFP  -Nutrition and low sodium diet discussed with patient and information given -Continue daily multivitamin -Recommended 30 minutes of aerobic and resistance exercise 3 days/week - information given to the patient  RUQ pain  worse with sitting up/down, can have slight tingling on it Had her RU lobectomy in 2022 S/p cholecystectomy, no rash Denies nausea, vomiting, no GERD, no change in bowel habits -Try salon pas patches, possible MSK/radicular from surgery, has had shinrix vaccinations - will get AB US   History of H. pylori gastritis EGD 08/03/2021 Treated with omeprazole , Pepto, metronidazole  and doxycycline  09/2021 H. pylori eradication study negative  Personal history of tubular adenomatous polyps/Personal history of colonic AVMs, no hematochezia or changes in bowel habits With 10 subcentimeter tubular adenomatous polyps sessile serrated 05/2020 08/03/2021 colonoscopy good prep 2 polyps 3 to 4 mm, few polyps 2 to 3 mm rectum, diverticulosis torturous colon single nonbleeding colonic AVM recall 08/03/2024 Will plan  recall colon with EGD after follow up OV in sept, can schedule then  Lung cancer diagnosed 03/2021 right upper lung mass History of COPD Quit smoking 2019 Status post right upper lobectomy and lymph node dissection Dr. Luna Salinas 08/14/2021 Following Dr. Baldwin [facility] pulmonary clinic, Dr. Liam Redhead in oncology Not on oxygen  Type 2 diabetes insulin -dependent  CAD by CTA April 2022 Follows with Dr. Alroy Aspen, last seen 09/2023 2022 echocardiogram normal ejection fraction and unremarkable valves No chest pain/SOB  Patient Care Team: Clem Currier, DO as PCP - General (Family Medicine) Sonny Dust, MD (Inactive) as PCP - Cardiology (Cardiology) Dorothe Gaster, RD as Dietitian (Family Medicine) [facility]  HISTORY OF PRESENT ILLNESS: 63 y.o. female with a past medical history of diabetes, lung CA, tobacco use disorder, COPD, allergic rhinitis, HTN, osteoarthritis and others listed below presents for evaluation of left-sided abdominal pain and cirrhosis.   Patient last seen 06/2021 by Dr. Karene Oto patient was found to have nodular liver on CT and ultrasound 06/2021 confirmed cirrhotic liver morphology no focal liver lesions normal spleen. She had colonoscopy and endoscopy 08/2021.  Discussed the use of AI scribe software for clinical note transcription with the patient, who gave verbal consent to proceed.  History of Present Illness   Sonya Walton is a 63 year old female with liver cirrhosis who presents with right upper quadrant pain.  She has been experiencing right upper quadrant pain for a couple of weeks, with worsening symptoms over the past week. The pain is described as uncomfortable, similar to a 'pulled muscle', and is tender to touch. It is exacerbated by leaning back or sitting up. No tingling, rash,  nausea, vomiting, heartburn, or reflux. She is uncertain if the pain is related to previous nerve damage from robotic surgery for lung cancer.  Her medical  history includes liver cirrhosis, likely due to fatty liver, with a nodular liver noted on ultrasound in 2022. She was previously evaluated for autoimmune causes, which were negative. Small esophageal varices were identified on endoscopy in 2022, and she is due for a repeat endoscopy. No swelling in her legs or abdomen, and no changes in bowel habits, diarrhea, constipation, or dark stools. She reports no alcohol use except for occasional drinks once a year.  Her past medical history also includes large colon polyps and diverticulosis noted during a colonoscopy in 2022, with a follow-up colonoscopy due in October 2025. She had H. pylori in 2022, which was treated and confirmed eradicated in November 2022. Her gallbladder was removed in 2013.  In terms of social history, she reports minimal alcohol use, only consuming a drink once a year during special occasions. She is not on oxygen therapy but uses an inhaler in the mornings.      She  reports that she quit smoking about 6 years ago. Her smoking use included cigarettes. She started smoking about 42 years ago. She has a 37 pack-year smoking history. She has never used smokeless tobacco. She reports current alcohol use. She reports that she does not use drugs.  RELEVANT GI HISTORY, IMAGING AND LABS: Results   LABS H. pylori eradication study: Negative (09/2021)  RADIOLOGY CT Abdomen: Abnormal liver, likely fatty liver (2022) Ultrasound Abdomen: Nodular liver, cirrhosis (2022)  DIAGNOSTIC REPORTS Colonoscopy: Large polyps, AVM, diverticulosis (2022) Endoscopy: Small varices (2022)     - Colonoscopy (2008): Single polyp per patient. No report available for review - Colonoscopy (05/2020): Tortuous colon. 10 subcentimeter tubular adenomas and sessile serrated polyps. Multiple rectal and rectosigmoid hyperplastic polyps. Sigmoid diverticulosis, nodular ICV (path: Benign), Ascending colon AVM. Normal TI. Recommended repeat in 1 year  /20 06/2021  echocardiogram EF 55 to 60% unremarkable valves  CBC    Component Value Date/Time   WBC 7.2 12/23/2023 0624   RBC 5.37 (H) 12/23/2023 0624   HGB 15.8 (H) 12/23/2023 0624   HGB 16.5 (H) 06/13/2023 0903   HGB 15.4 12/04/2018 1043   HCT 47.4 (H) 12/23/2023 0624   HCT 46.0 12/04/2018 1043   PLT 186 12/23/2023 0624   PLT 214 06/13/2023 0903   PLT 206 06/29/2021 1022   MCV 88.3 12/23/2023 0624   MCV 89 12/04/2018 1043   MCH 29.4 12/23/2023 0624   MCHC 33.3 12/23/2023 0624   RDW 14.7 12/23/2023 0624   RDW 13.9 12/04/2018 1043   LYMPHSABS 2.5 06/13/2023 0903   MONOABS 0.7 06/13/2023 0903   EOSABS 0.3 06/13/2023 0903   BASOSABS 0.0 06/13/2023 0903   Recent Labs    03/14/23 0857 06/13/23 0903 12/23/23 0624  HGB 16.4* 16.5* 15.8*    CMP     Component Value Date/Time   NA 142 01/16/2024 1007   K 3.7 01/16/2024 1007   CL 100 01/16/2024 1007   CO2 25 01/16/2024 1007   GLUCOSE 135 (H) 01/16/2024 1007   GLUCOSE 150 (H) 12/23/2023 0624   BUN 13 01/16/2024 1007   CREATININE 0.69 01/16/2024 1007   CREATININE 0.65 06/13/2023 0903   CREATININE 0.69 04/12/2016 0953   CALCIUM  9.5 01/16/2024 1007   PROT 7.3 06/13/2023 0903   PROT 6.8 07/23/2019 0958   ALBUMIN 4.1 06/13/2023 0903   ALBUMIN 4.1 07/23/2019 0958  AST 26 06/13/2023 0903   ALT 35 06/13/2023 0903   ALKPHOS 70 06/13/2023 0903   BILITOT 0.8 06/13/2023 0903   GFRNONAA >60 12/23/2023 0624   GFRNONAA >60 06/13/2023 0903   GFRNONAA >89 04/12/2016 0953   GFRAA 102 07/21/2020 1602   GFRAA >89 04/12/2016 0953      Latest Ref Rng & Units 06/13/2023    9:03 AM 03/14/2023    8:57 AM 09/10/2022   12:56 PM  Hepatic Function  Total Protein 6.5 - 8.1 g/dL 7.3  7.4  7.8   Albumin 3.5 - 5.0 g/dL 4.1  4.1  4.1   AST 15 - 41 U/L 26  34  29   ALT 0 - 44 U/L 35  39  32   Alk Phosphatase 38 - 126 U/L 70  76  81   Total Bilirubin 0.3 - 1.2 mg/dL 0.8  0.7  0.7       Current Medications:   Current Outpatient Medications  (Endocrine & Metabolic):    empagliflozin  (JARDIANCE ) 10 MG TABS tablet, Take 1 tablet (10 mg total) by mouth daily.   insulin  glargine (LANTUS ) 100 UNIT/ML Solostar Pen, Inject 22 Units into the skin daily.  Current Outpatient Medications (Cardiovascular):    atenolol -chlorthalidone  (TENORETIC ) 50-25 MG tablet, Take 1 tablet by mouth daily.   nitroGLYCERIN  (NITROSTAT ) 0.4 MG SL tablet, Place 0.4 mg under the tongue every 5 (five) minutes as needed for chest pain.   rosuvastatin  (CRESTOR ) 10 MG tablet, Take 1 tablet (10 mg total) by mouth daily.  Current Outpatient Medications (Respiratory):    Tiotropium Bromide Monohydrate  (SPIRIVA  RESPIMAT) 2.5 MCG/ACT AERS, Inhale 2 puffs into the lungs daily.  Current Outpatient Medications (Analgesics):    acetaminophen  (TYLENOL ) 500 MG tablet, Take 500 mg by mouth every 6 (six) hours as needed for mild pain or moderate pain.   aspirin  EC 81 MG tablet, Take 1 tablet (81 mg total) by mouth daily. Swallow whole.   Current Outpatient Medications (Other):    Accu-Chek Softclix Lancets lancets, Use as instructed   blood glucose meter kit and supplies KIT, Inject 1 each into the skin as directed. Use meter once daily to measure blood glucose.   Cholecalciferol  (VITAMIN D3) 2000 UNITS TABS, Take 2,000 Units by mouth every morning.   glucose blood (ACCU-CHEK GUIDE TEST) test strip, Use as instructed   Insulin  Pen Needle (RELION PEN NEEDLES) 32G X 4 MM MISC, USE 1 NEEDLE DAILY AS DIRECTED   potassium chloride  SA (KLOR-CON  M) 20 MEQ tablet, Take 2 tablets (40 mEq total) by mouth daily.  Medical History:  Past Medical History:  Diagnosis Date   Arthritis    Cancer (HCC)    Coronary artery disease    mild by CT 02/05/21   Diabetes mellitus without complication (HCC)    Hypertension    states under control with med., has been on med. since 2011   Wears dentures    upper   Allergies:  Allergies  Allergen Reactions   Percocet [Oxycodone -Acetaminophen ]  Other (See Comments)    Dysphoria   Clotrimazole  Rash     Surgical History:  She  has a past surgical history that includes Tubal ligation (1981); Cholecystectomy (01/17/2012); Bunionectomy (Bilateral, 1994); Tonsillectomy (1975); Total vaginal hysterectomy (03/05/2003); Pubovaginal sling (03/05/2003); Shoulder arthroscopy with distal clavicle resection (Right, 01/26/2016); Shoulder arthroscopy with subacromial decompression (Right, 01/26/2016); Breast biopsy (Left); Vaginal hysterectomy (2004); Video bronchoscopy with endobronchial navigation (N/A, 03/09/2021); Bronchial biopsy (03/09/2021); Bronchial brushings (03/09/2021); Bronchial needle aspiration  biopsy (03/09/2021); Fiducial marker placement (03/09/2021); Node dissection (Right, 08/14/2021); Intercostal nerve block (Right, 08/14/2021); right upper lobectomy (08/14/2021); and Tooth Extraction (N/A, 12/23/2023). Family History:  Her family history includes Asthma in her brother, sister, and another family member; Breast cancer in her niece and sister; COPD in her sister; Cancer in her father, paternal aunt, and another family member; Diabetes in her brother and another family member; Diabetes type II in her brother and sister; Heart disease in her mother; Heart failure in her mother; Hyperlipidemia in her mother; Hypertension in her brother, mother, sister, and another family member; Liver cancer in her father.  REVIEW OF SYSTEMS  : All other systems reviewed and negative except where noted in the History of Present Illness.  PHYSICAL EXAM: BP 130/78   Pulse 78   Ht 5\' 2"  (1.575 m)   Wt 218 lb (98.9 kg)   BMI 39.87 kg/m  Physical Exam   GENERAL APPEARANCE: Well nourished, in no apparent distress. HEENT: No cervical lymphadenopathy, unremarkable thyroid , sclerae anicteric, conjunctiva pink. RESPIRATORY: Respiratory effort normal, breath sounds clear and equal bilaterally without rales, rhonchi, or wheezing. CARDIO: Regular rate and rhythm  with no murmurs, rubs, or gallops, peripheral pulses intact. ABDOMEN: Soft, non-distended, active bowel sounds in all four quadrants, non-tender to palpation, no rebound, no mass appreciated, no ascites. RECTAL: Declines. MUSCULOSKELETAL: Full range of motion, normal gait, without edema. SKIN: Dry, intact without rashes or lesions. No jaundice. NEURO: Alert, oriented, no focal deficits. PSYCH: Cooperative, normal mood and affect. EXTREMITIES: No edema.      Sonya Gottron, PA-C 11:16 AM

## 2024-02-27 ENCOUNTER — Ambulatory Visit (INDEPENDENT_AMBULATORY_CARE_PROVIDER_SITE_OTHER): Admitting: Physician Assistant

## 2024-02-27 ENCOUNTER — Telehealth: Payer: Self-pay

## 2024-02-27 ENCOUNTER — Other Ambulatory Visit (INDEPENDENT_AMBULATORY_CARE_PROVIDER_SITE_OTHER)

## 2024-02-27 VITALS — BP 130/78 | HR 78 | Ht 62.0 in | Wt 218.0 lb

## 2024-02-27 DIAGNOSIS — Z8619 Personal history of other infectious and parasitic diseases: Secondary | ICD-10-CM | POA: Diagnosis not present

## 2024-02-27 DIAGNOSIS — J449 Chronic obstructive pulmonary disease, unspecified: Secondary | ICD-10-CM

## 2024-02-27 DIAGNOSIS — R1011 Right upper quadrant pain: Secondary | ICD-10-CM | POA: Diagnosis not present

## 2024-02-27 DIAGNOSIS — Z902 Acquired absence of lung [part of]: Secondary | ICD-10-CM | POA: Diagnosis not present

## 2024-02-27 DIAGNOSIS — K7460 Unspecified cirrhosis of liver: Secondary | ICD-10-CM | POA: Diagnosis not present

## 2024-02-27 DIAGNOSIS — D751 Secondary polycythemia: Secondary | ICD-10-CM

## 2024-02-27 DIAGNOSIS — Z87891 Personal history of nicotine dependence: Secondary | ICD-10-CM | POA: Diagnosis not present

## 2024-02-27 DIAGNOSIS — Z794 Long term (current) use of insulin: Secondary | ICD-10-CM

## 2024-02-27 DIAGNOSIS — K7581 Nonalcoholic steatohepatitis (NASH): Secondary | ICD-10-CM

## 2024-02-27 DIAGNOSIS — Z85118 Personal history of other malignant neoplasm of bronchus and lung: Secondary | ICD-10-CM

## 2024-02-27 DIAGNOSIS — E876 Hypokalemia: Secondary | ICD-10-CM

## 2024-02-27 DIAGNOSIS — Z860101 Personal history of adenomatous and serrated colon polyps: Secondary | ICD-10-CM | POA: Diagnosis not present

## 2024-02-27 DIAGNOSIS — E119 Type 2 diabetes mellitus without complications: Secondary | ICD-10-CM

## 2024-02-27 LAB — CBC WITH DIFFERENTIAL/PLATELET
Basophils Absolute: 0.1 10*3/uL (ref 0.0–0.1)
Basophils Relative: 0.7 % (ref 0.0–3.0)
Eosinophils Absolute: 0.2 10*3/uL (ref 0.0–0.7)
Eosinophils Relative: 3 % (ref 0.0–5.0)
HCT: 49.9 % — ABNORMAL HIGH (ref 36.0–46.0)
Hemoglobin: 16.3 g/dL — ABNORMAL HIGH (ref 12.0–15.0)
Lymphocytes Relative: 34.7 % (ref 12.0–46.0)
Lymphs Abs: 2.5 10*3/uL (ref 0.7–4.0)
MCHC: 32.7 g/dL (ref 30.0–36.0)
MCV: 88.1 fl (ref 78.0–100.0)
Monocytes Absolute: 0.5 10*3/uL (ref 0.1–1.0)
Monocytes Relative: 7.3 % (ref 3.0–12.0)
Neutro Abs: 3.9 10*3/uL (ref 1.4–7.7)
Neutrophils Relative %: 54.3 % (ref 43.0–77.0)
Platelets: 220 10*3/uL (ref 150.0–400.0)
RBC: 5.66 Mil/uL — ABNORMAL HIGH (ref 3.87–5.11)
RDW: 14.8 % (ref 11.5–15.5)
WBC: 7.3 10*3/uL (ref 4.0–10.5)

## 2024-02-27 LAB — COMPREHENSIVE METABOLIC PANEL WITH GFR
ALT: 36 U/L — ABNORMAL HIGH (ref 0–35)
AST: 35 U/L (ref 0–37)
Albumin: 4.1 g/dL (ref 3.5–5.2)
Alkaline Phosphatase: 73 U/L (ref 39–117)
BUN: 11 mg/dL (ref 6–23)
CO2: 31 meq/L (ref 19–32)
Calcium: 9.5 mg/dL (ref 8.4–10.5)
Chloride: 99 meq/L (ref 96–112)
Creatinine, Ser: 0.72 mg/dL (ref 0.40–1.20)
GFR: 88.54 mL/min (ref >60.00)
Glucose, Bld: 195 mg/dL — ABNORMAL HIGH (ref 70–99)
Potassium: 3.1 meq/L — ABNORMAL LOW (ref 3.5–5.1)
Sodium: 139 meq/L (ref 135–145)
Total Bilirubin: 0.9 mg/dL (ref 0.2–1.2)
Total Protein: 7.6 g/dL (ref 6.0–8.3)

## 2024-02-27 LAB — PROTIME-INR
INR: 1.2 ratio — ABNORMAL HIGH (ref 0.8–1.0)
Prothrombin Time: 12.8 s (ref 9.6–13.1)

## 2024-02-27 NOTE — Telephone Encounter (Signed)
 Patient called into the office for clarification of lab results that were sent via MyChart. I reviewed the information with patient. She understands that we will add on IBC + Ferritin panel and Magnesium. Patient is aware that her Potassium level is low and Mylinda Asa has deferred management of that to her PCP. Pt verbalized understanding and had no concerns at the end of the call.   IBC + Ferritin and magnesium orders in epic. Lab add on sheet faxed to [facility] lab.

## 2024-02-27 NOTE — Patient Instructions (Addendum)
 Your provider has requested that you go to the basement level for lab work before leaving today. Press "B" on the elevator. The lab is located at the first door on the left as you exit the elevator.  No aleve, ibuprofen , goody powders, as these are antiinflammatories and can cause inflammation in your stomach, increase bleeding risk and cause ulcers.  You can talk with PCP about alternative pain options.  Can do tyelnol max 3000 mg a day, salon pas patches are over the counter   You should have an imaging study every 6 months to monitor for the development of hepatocellular carcinoma (liver cancer). The risk is low, but, if liver cancer is diagnosed early, there are better treatment options. Will get AFP, INR, CBC, and CMET.  Will follow up in 6 months to check labs and evaluate.   I recommend a high-protein, primarily plant-based diet. Avoid red meat. Work to maintain a health weight. Weigh yourself daily- call if you have weight gain of greater than 5 lbs in a 1-2 days, leg swelling, or new swelling in your abdomen. Minimize salt intake- VERY important. Please do not consume more than 2000 mg of sodium every day. Monitor your blood pressure at home.  Stay active. Weight-based exercise for 30 minutes at least 3 days a week is recommended. I recommend that you not drink any alcohol including beer, wine, liquor, and non-alcoholic beer.   You are at increased risk of osteopenia and osteoporosis. You should be screened for these metabolic bone diseases if you have not already had the testing performed.  Cirrhosis Cirrhosis is long-term (chronic) liver injury. The liver is the body's largest internal organ, and it performs many functions. It converts food into energy, removes toxic material from the blood, makes important proteins, and absorbs necessary vitamins from food. In cirrhosis, healthy liver cells are replaced by scar tissue. This prevents blood from flowing through the liver and makes it  difficult for the liver to complete its functions. What are the causes? Common causes of this condition are hepatitis C and long-term alcohol abuse. Other causes include: Nonalcoholic fatty liver disease (NAFLD). This happens when fat is deposited in the liver by causes other than alcohol. Hepatitis B infection. Autoimmune hepatitis. In this condition, the body's defense system (immune system) mistakenly attacks the liver cells, causing inflammation. Diseases that cause blockage of ducts inside the liver. Inherited liver diseases, such as hemochromatosis. This is one of the most common inherited liver diseases. In this disease, deposits of iron collect in the liver and other organs. Reactions to certain long-term medicines, such as amiodarone, a heart medicine. Parasitic infections. These include schistosomiasis, which is caused by a flatworm. Long-term contact to certain toxins. These toxins include certain organic solvents, such as toluene and chloroform. What increases the risk? You are more likely to develop this condition if: You have certain types of viral hepatitis. You abuse alcohol, especially if you are female. You are overweight. You use IV drugs and share needles. You have unprotected sex with someone who has viral hepatitis. What are the signs or symptoms? You may not have any signs and symptoms at first. Symptoms may not develop until the damage to your liver starts to get worse. Early symptoms may include: Weakness and tiredness (fatigue). Changes in sleep patterns or having trouble sleeping. Itchiness. Tenderness in the right-upper part of your abdomen. Weight loss and muscle loss. Nausea. Loss of appetite. Later symptoms may include: Fatigue or weakness that is getting worse. Yellow  skin and eyes (jaundice). Buildup of fluid in the abdomen (ascites). You may notice that your clothes are tight around your waist. Weight gain and swelling of the feet and ankles  (edema). Trouble breathing. Easy bruising and bleeding. Vomiting blood, or black or bloody stool. Mental confusion. How is this diagnosed? Your health care provider may suspect cirrhosis based on your symptoms and medical history, especially if you have other medical conditions or a history of alcohol abuse. Your health care provider will do a physical exam to feel your liver and to check for signs of cirrhosis. Tests may include: Blood tests to check: For hepatitis B or C. Kidney function. Liver function. Imaging tests such as: MRI or CT scan to look for changes seen in advanced cirrhosis. Ultrasound to see if normal liver tissue is being replaced by scar tissue. A procedure in which a long needle is used to take a sample of liver tissue to be checked in a lab (biopsy). Liver biopsy can confirm the diagnosis of cirrhosis. How is this treated? Treatment for this condition depends on how damaged your liver is and what caused the damage. It may include treating the symptoms of cirrhosis, or treating the underlying causes to slow the damage. Treatment may include: Making lifestyle changes, such as: Eating a healthy diet. You may need to work with your health care provider or a dietitian to develop an eating plan. Restricting salt intake. Maintaining a healthy weight. Not abusing drugs or alcohol. Taking medicines to: Treat liver infections or other infections. Control itching. Reduce fluid buildup. Reduce certain blood toxins. Reduce risk of bleeding from enlarged blood vessels in the stomach or esophagus (varices). Liver transplant. In this procedure, a liver from a donor is used to replace your diseased liver. This is done if cirrhosis has caused liver failure. Other treatments and procedures may be done depending on the problems that you get from cirrhosis. Common problems include liver-related kidney failure (hepatorenal syndrome). Follow these instructions at home:  Take medicines  only as told by your health care provider. Do not use medicines that are toxic to your liver. Ask your health care provider before taking any new medicines, including over-the-counter medicines such as NSAIDs. Rest as needed. Eat a well-balanced diet. Limit your salt or water  intake, if your health care provider asks you to do this. Do not drink alcohol. This is especially important if you routinely take acetaminophen . Keep all follow-up visits. This is important. Contact a health care provider if you: Have fatigue or weakness that is getting worse. Develop swelling of the hands, feet, or legs, or a buildup of fluid in the abdomen (ascites). Have a fever or chills. Develop loss of appetite. Have nausea or vomiting. Develop jaundice. Develop easy bruising or bleeding. Get help right away if you: Vomit bright red blood or a material that looks like coffee grounds. Have blood in your stools. Notice that your stools appear black and tarry. Become confused. Have chest pain or trouble breathing. These symptoms may represent a serious problem that is an emergency. Do not wait to see if the symptoms will go away. Get medical help right away. Call your local emergency services (911 in the [place] Do not drive yourself to the hospital. Summary Cirrhosis is chronic liver injury. Common causes are hepatitis C and long-term alcohol abuse. Tests used to diagnose cirrhosis include blood tests, imaging tests, and liver biopsy. Treatment for this condition involves treating the underlying cause. Avoid alcohol, drugs, salt, and medicines that  may damage your liver. Get help right away if you vomit bright red blood or a material that looks like coffee grounds. This information is not intended to replace advice given to you by your health care provider. Make sure you discuss any questions you have with your health care provider. Document Revised: 07/31/2020 Document Reviewed: 07/31/2020 Elsevier Patient  Education  2022 [employer].  Due to recent changes in healthcare laws, you may see the results of your imaging and laboratory studies on MyChart before your provider has had a chance to review them.  We understand that in some cases there may be results that are confusing or concerning to you. Not all laboratory results come back in the same time frame and the provider may be waiting for multiple results in order to interpret others.  Please give us  48 hours in order for your provider to thoroughly review all the results before contacting the office for clarification of your results.

## 2024-02-28 ENCOUNTER — Ambulatory Visit (INDEPENDENT_AMBULATORY_CARE_PROVIDER_SITE_OTHER)

## 2024-02-28 DIAGNOSIS — E876 Hypokalemia: Secondary | ICD-10-CM | POA: Diagnosis not present

## 2024-02-28 DIAGNOSIS — D751 Secondary polycythemia: Secondary | ICD-10-CM | POA: Diagnosis not present

## 2024-02-28 LAB — IBC + FERRITIN
Ferritin: 164.1 ng/mL (ref 10.0–291.0)
Iron: 96 ug/dL (ref 42–145)
Saturation Ratios: 29.1 % (ref 20.0–50.0)
TIBC: 330.4 ug/dL (ref 250.0–450.0)
Transferrin: 236 mg/dL (ref 212.0–360.0)

## 2024-02-28 LAB — MAGNESIUM: Magnesium: 2.1 mg/dL (ref 1.5–2.5)

## 2024-02-28 LAB — AFP TUMOR MARKER: AFP-Tumor Marker: 5 ng/mL

## 2024-02-29 NOTE — Progress Notes (Signed)
 Agree with the assessment and plan as outlined by Quentin Mulling, PA-C. ? ?Keron Neenan, DO, FACG ? ?

## 2024-03-02 ENCOUNTER — Ambulatory Visit (HOSPITAL_COMMUNITY)
Admission: RE | Admit: 2024-03-02 | Discharge: 2024-03-02 | Disposition: A | Discharge status: Home / Self Care | Source: Ambulatory Visit | Attending: Physician Assistant | Admitting: Physician Assistant

## 2024-03-02 DIAGNOSIS — Z9049 Acquired absence of other specified parts of digestive tract: Secondary | ICD-10-CM | POA: Diagnosis not present

## 2024-03-02 DIAGNOSIS — K7460 Unspecified cirrhosis of liver: Secondary | ICD-10-CM | POA: Diagnosis not present

## 2024-03-05 ENCOUNTER — Ambulatory Visit: Payer: Medicaid Other | Admitting: Podiatry

## 2024-03-07 ENCOUNTER — Other Ambulatory Visit: Payer: Self-pay | Admitting: Student

## 2024-03-07 DIAGNOSIS — Z1231 Encounter for screening mammogram for malignant neoplasm of breast: Secondary | ICD-10-CM

## 2024-03-12 ENCOUNTER — Ambulatory Visit: Admitting: Student

## 2024-03-16 ENCOUNTER — Telehealth: Payer: Self-pay | Admitting: Internal Medicine

## 2024-03-16 NOTE — Telephone Encounter (Signed)
 Attempted to leave a voicemail but was unable to do so. Rescheduled appointment and the patient will be mailed an appointment reminder.

## 2024-03-20 ENCOUNTER — Ambulatory Visit: Admitting: Student

## 2024-03-20 VITALS — BP 104/68 | HR 73 | Ht 62.0 in | Wt 219.6 lb

## 2024-03-20 DIAGNOSIS — D751 Secondary polycythemia: Secondary | ICD-10-CM

## 2024-03-20 DIAGNOSIS — E876 Hypokalemia: Secondary | ICD-10-CM | POA: Diagnosis not present

## 2024-03-20 DIAGNOSIS — K7460 Unspecified cirrhosis of liver: Secondary | ICD-10-CM | POA: Diagnosis not present

## 2024-03-20 DIAGNOSIS — L918 Other hypertrophic disorders of the skin: Secondary | ICD-10-CM | POA: Diagnosis not present

## 2024-03-20 NOTE — Patient Instructions (Addendum)
 It was great to see you today!   Today we addressed: Keep the area clean and dry, you can put Vaseline on for healing.  I will send a referral over to derm clinic for you to come back for removal of other mole  Future Appointments  Date Time Provider Department Center  04/03/2024  9:50 AM [facility]  04/09/2024  9:15 AM Ruffin Cotton, DPM [facility]  06/13/2024 10:00 AM [facility] None  06/20/2024  2:00 PM Marlene Simas, MD [facility] None    Please arrive 15 minutes before your appointment to ensure smooth check in process.    Please call the clinic at [phone] if your symptoms worsen or you have any concerns.  Thank you for allowing me to participate in your care, Dr. Clem Currier [facility]

## 2024-03-20 NOTE — Assessment & Plan Note (Signed)
 Patient to continue taking daily potassium supplementation.  Will also recheck CMP tomorrow as patient left before labs were drawn.  Called patient and scheduled a lab appointment for tomorrow morning.  Future lab orders placed.

## 2024-03-20 NOTE — Progress Notes (Signed)
    SUBJECTIVE:   CHIEF COMPLAINT / HPI:   Sonya Walton is a 63 y.o. female presenting for follow-up of ultrasound results and lab work obtained by gastroenterology in April.  The ultrasound results are within normal limits.  Her lab work is significant for chronic polycythemia and hypokalemia.  She has been compliant on 20 mEq of potassium twice daily.  She reports she is also compliant with Lasix.  She would also like for us  to look at several skin tags and moles.  They have been bothering her due to their location and she would like to have them removed today.  PERTINENT  PMH / PSH: reviewed and updated.  OBJECTIVE:   BP 104/68   Pulse 73   Wt 219 lb 9.6 oz (99.6 kg)   SpO2 96%   BMI 40.17 kg/m   Will-appearing, no acute distress Cardio: Regular rate, regular rhythm, no murmurs on exam. Pulm: Clear, no wheezing, no crackles. No increased work of breathing Abdominal: bowel sounds present, soft, non-tender, non-distended Extremities: no peripheral edema  Neuro: alert and oriented x3, speech normal in content, no facial asymmetry, strength intact and equal bilaterally in UE and LE, pupils equal and reactive to light.  Psych:  Cognition and judgment appear intact. Alert, communicative  and cooperative with normal attention span and concentration. No apparent delusions, illusions, hallucinations    ASSESSMENT/PLAN:   Assessment & Plan Polycythemia Hemoglobin chronically elevated to 16.3.  Discussed possible referral to hematology but will obtain JAK2 exon 12 and EPO.  Most likely this is due to her smoking, chronic lung disease and OSA. Cirrhosis of liver without ascites, unspecified hepatic cirrhosis type (HCC) Reviewed right upper quadrant ultrasound.  Patient is following with gastroenterology.  She will be scheduled to have a colonoscopy and EGD in October of this year. Hypokalemia Patient to continue taking daily potassium supplementation.  Will also recheck CMP tomorrow as  patient left before labs were drawn.  Called patient and scheduled a lab appointment for tomorrow morning.  Future lab orders placed.   Skin tags: Patient consented verbally to procedure.  Dr. Bevin Bucks assessed patient and agreed with plan for removal.  Skin was prepped with alcohol wipe.  Lidocaine  numbing spray was applied to the area.  Pickups were used to elevate the skin tag and scissors were used to cut the base.  This was repeated 10 times in her right axilla and around her neck.  Patient tolerated the procedure well.  Bleeding was controlled and Band-Aids were applied.  Clem Currier, DO [facility]

## 2024-03-21 ENCOUNTER — Other Ambulatory Visit

## 2024-03-21 DIAGNOSIS — K7460 Unspecified cirrhosis of liver: Secondary | ICD-10-CM | POA: Diagnosis not present

## 2024-03-21 DIAGNOSIS — D751 Secondary polycythemia: Secondary | ICD-10-CM | POA: Diagnosis not present

## 2024-03-21 NOTE — Addendum Note (Signed)
 Addended by: Bevin Bucks, Elsey Holts on: 03/21/2024 05:37 AM   Modules accepted: Orders

## 2024-03-22 ENCOUNTER — Telehealth: Payer: Self-pay

## 2024-03-22 NOTE — Telephone Encounter (Signed)
 Patient calls nurse line regarding speaking with provider regarding lab results. Advised patient that results are still pending for two of her labs and that our office will reach out once we have all of her results.   Patient appreciative.   Sonya Halo, RN

## 2024-03-23 LAB — JAK2 V617F, REFLEX TO E12-15

## 2024-03-28 LAB — COMPREHENSIVE METABOLIC PANEL WITH GFR
ALT: 39 IU/L — ABNORMAL HIGH (ref 0–32)
AST: 34 IU/L (ref 0–40)
Albumin: 3.9 g/dL (ref 3.9–4.9)
Alkaline Phosphatase: 106 IU/L (ref 44–121)
BUN/Creatinine Ratio: 16 (ref 12–28)
BUN: 10 mg/dL (ref 8–27)
Bilirubin Total: 0.6 mg/dL (ref 0.0–1.2)
CO2: 23 mmol/L (ref 20–29)
Calcium: 9.2 mg/dL (ref 8.7–10.3)
Chloride: 102 mmol/L (ref 96–106)
Creatinine, Ser: 0.64 mg/dL (ref 0.57–1.00)
Globulin, Total: 2.7 g/dL (ref 1.5–4.5)
Glucose: 183 mg/dL — ABNORMAL HIGH (ref 70–99)
Potassium: 4 mmol/L (ref 3.5–5.2)
Sodium: 141 mmol/L (ref 134–144)
Total Protein: 6.6 g/dL (ref 6.0–8.5)
eGFR: 99 mL/min/{1.73_m2} (ref >59)

## 2024-03-28 LAB — CBC WITH DIFFERENTIAL/PLATELET
Basophils Absolute: 0 10*3/uL (ref 0.0–0.2)
Basos: 0 %
EOS (ABSOLUTE): 0.2 10*3/uL (ref 0.0–0.4)
Eos: 3 %
Hematocrit: 51.3 % — ABNORMAL HIGH (ref 34.0–46.6)
Hemoglobin: 16.4 g/dL — ABNORMAL HIGH (ref 11.1–15.9)
Immature Grans (Abs): 0 10*3/uL (ref 0.0–0.1)
Immature Granulocytes: 0 %
Lymphocytes Absolute: 2.8 10*3/uL (ref 0.7–3.1)
Lymphs: 39 %
MCH: 28.4 pg (ref 26.6–33.0)
MCHC: 32 g/dL (ref 31.5–35.7)
MCV: 89 fL (ref 79–97)
Monocytes Absolute: 0.6 10*3/uL (ref 0.1–0.9)
Monocytes: 9 %
Neutrophils Absolute: 3.5 10*3/uL (ref 1.4–7.0)
Neutrophils: 49 %
Platelets: 205 10*3/uL (ref 150–450)
RBC: 5.77 x10E6/uL — ABNORMAL HIGH (ref 3.77–5.28)
RDW: 14.5 % (ref 11.7–15.4)
WBC: 7.3 10*3/uL (ref 3.4–10.8)

## 2024-03-28 LAB — JAK2 V617F, REFLEX TO E12-15

## 2024-03-28 LAB — E12-E15

## 2024-03-28 LAB — ERYTHROPOIETIN: Erythropoietin: 12.7 m[IU]/mL (ref 2.6–18.5)

## 2024-03-29 ENCOUNTER — Telehealth: Payer: Self-pay | Admitting: Student

## 2024-03-29 ENCOUNTER — Ambulatory Visit: Payer: Self-pay | Admitting: Student

## 2024-03-29 NOTE — Telephone Encounter (Signed)
 Patient called saying she just spoke with you about bloodwork she said she forgot to ask you something. I told her I would take a message and send it to you.   She is wanting you to please call her back, it is questions about a rare gene in her family.   Thanks!

## 2024-03-29 NOTE — Telephone Encounter (Signed)
 Returning patient call.   She reports a family history of a rare gene. 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 She reports this runs in her family with a history ectodermal dysplasia related to this gene.   Her great granddaughter, daughter and great niece have all tested positive for this.   Clem Currier, DO [facility], PGY-2 03/29/24 4:24 PM

## 2024-03-29 NOTE — Telephone Encounter (Signed)
 Called to discuss labs with patient. Overall everything looks normal. Will monitor CBC every 3-6 months and watch hematocrit.   Clem Currier, DO [facility], PGY-2 03/29/24 3:24 PM

## 2024-04-03 ENCOUNTER — Ambulatory Visit
Admission: RE | Admit: 2024-04-03 | Discharge: 2024-04-03 | Disposition: A | Discharge status: Home / Self Care | Source: Ambulatory Visit

## 2024-04-03 DIAGNOSIS — Z1231 Encounter for screening mammogram for malignant neoplasm of breast: Secondary | ICD-10-CM

## 2024-04-05 ENCOUNTER — Ambulatory Visit: Admitting: Family Medicine

## 2024-04-05 VITALS — BP 106/68 | HR 74 | Wt 219.0 lb

## 2024-04-05 DIAGNOSIS — L989 Disorder of the skin and subcutaneous tissue, unspecified: Secondary | ICD-10-CM

## 2024-04-05 DIAGNOSIS — L918 Other hypertrophic disorders of the skin: Secondary | ICD-10-CM | POA: Diagnosis not present

## 2024-04-05 NOTE — Patient Instructions (Signed)
Skin Tag Removal Wound Care instructions  . Sometimes bandages are not necessary.  If a bandage is used, leave the original bandage on for 24 hours if possible.  If the bandage becomes soaked or soiled before that time, it is OK to remove it and examine the wound.  A small amount of post-operative bleeding is normal.  If excessive bleeding occurs, remove the bandage, place gauze over the site and apply continuous pressure (no peeking) over the area for 20-30 minutes.  If this does not stop the bleeding, try again for 40 minutes.  If this does not work, please call our clinic as soon as possible (even if after hours).    . Once a day, cleanse the wound with soap and water.  If a thick crust develops you may use a Q-tip dipped into dilute hydrogen peroxide (mix 1:1 with water) to dissolve it.  Hydrogen peroxide can slow the healing process, so use it only as needed.  After washing, apply Vaseline jelly or Polysporin ointment.  For best healing, the wound should be covered with a layer of ointment at all times.  This may mean re-applying the ointment several times a day.  For open wounds, continue until it has healed.    . If you have any swelling, keep the area elevated.  . Some redness, tenderness and white or yellow material in the wound is normal healing.  If the area becomes very sore and red, or develops a thick yellow-green material (pus), it may be infected; please notify us.    . Wound healing continues for up to one year following surgery.  It is not unusual to experience pain in the scar from time to time during the interval.  If the pain becomes severe or the scar thickens, you should notify the office.  A slight amount of redness in a scar is expected for the first six months.  After six months, the redness subsides and the scar will soften and fade.  The color difference becomes less noticeable with time.  If there are any problems, return for a post-op surgery check at your earliest  convenience.  . It is important to wear sunscreen daily with an SPF of 30 or higher over the treated sites and to wear sun-protective clothing.  . Please call our office for any questions or concerns.  

## 2024-04-05 NOTE — Progress Notes (Signed)
    SUBJECTIVE:   CHIEF COMPLAINT / HPI:   Skin tags: Has multiple skin tags on her neck and will like to get them off. This has been present for many years.  Scalp lesion: Has one lesion on her scalp and wants to get it looked at. It has been there for many months. She can't tell if it has changed in color, but a bit bigger than when it started.  Face lesions: She has multiple facial lesions. One on her left chin has been there for many months. Denies any symptoms. She is not interested in taking it out now since she recently had dental work, and her jaw is sore a little.  Back lesions: She has multiple lesions on her back, which have been present for many months to years. No symptoms.   PERTINENT  PMH / PSH: PMHx reviewed  OBJECTIVE:   BP 106/68   Pulse 74   Wt 219 lb (99.3 kg)   SpO2 97%   BMI 40.06 kg/m   Physical Exam Vitals and nursing note reviewed.  Constitutional:      Appearance: Normal appearance.  Skin:    Comments: Soft fleshy lesion on her left jaw. Multiple, smaller, but similar lesions on the face with a few sebaceous lesions.  Soft, raised, mildly hyperpigmented lesion on the left side of her chest, about 0.5 - 1 cm circumferentially.  Multiple hyperpigmented, raised, round lesions on her back with varying size and color. One on her left upper back has atypical discoloration.  Raised, round, soft, mildly hyperpigmented lesion on her scalp - temporal area  Multiple benign looking skin tags around her neck  Neurological:     Mental Status: She is alert.    Left Chin lesion   Anterior chest - left   Back lesion   Left upper back lesion   Temporal scalp lesion     ASSESSMENT/PLAN:   Assessment & Plan Skin tag She wants her skin tags removed She declined intralesional anesthetic administration and requested cold spray instead Informed verbal and written consent obtained  Excision of Benign Skin Lesion Procedure Note  PRE-OP  DIAGNOSIS: Multiple skin tags of the neck  POST-OP DIAGNOSIS: Same   PROCEDURE: skin tags excision  Performing Physician: Penni Bowman, MD, MPH   PROCEDURE:  Scissors   excision   The area surrounding the skin lesion was prepared and draped in the  usual sterile manner.  Cold spray anesthetics applied to each 12 lesions. The lesion was removed in the usual manner by the  biopsy method noted above. Twelve skin tags were excised. Hemostasis was assured.  Closure:  round brown bandage applied to most of the bigger lesions  Followup: The patient tolerated the procedure well without  complications.  Standard post-procedure care is explained and return  precautions are given.  Scalp lesion Looks like SK She will return for cryotherapy  Back skin lesion Mostly AKs vs SK One on her left upper back has mildly uneven coloration Discussed need for punch or excisional biopsy in the future She will schedule f/u due to multiple skin tags removal today She agreed with the plan  Chest skin lesion Likely SK Return for cryotherapy vs excision She agreed with the plan Face lesion Likely skin tag Removal offered today but she declined She will reschedule appointment for removal in the future     Penni Bowman, MD [facility]

## 2024-04-09 ENCOUNTER — Ambulatory Visit (INDEPENDENT_AMBULATORY_CARE_PROVIDER_SITE_OTHER): Admitting: Podiatry

## 2024-04-09 ENCOUNTER — Encounter: Payer: Self-pay | Admitting: Podiatry

## 2024-04-09 ENCOUNTER — Ambulatory Visit: Payer: Self-pay | Admitting: Student

## 2024-04-09 DIAGNOSIS — M79674 Pain in right toe(s): Secondary | ICD-10-CM | POA: Diagnosis not present

## 2024-04-09 DIAGNOSIS — M79675 Pain in left toe(s): Secondary | ICD-10-CM

## 2024-04-09 DIAGNOSIS — L84 Corns and callosities: Secondary | ICD-10-CM | POA: Diagnosis not present

## 2024-04-09 DIAGNOSIS — B351 Tinea unguium: Secondary | ICD-10-CM

## 2024-04-09 DIAGNOSIS — E1142 Type 2 diabetes mellitus with diabetic polyneuropathy: Secondary | ICD-10-CM

## 2024-04-09 NOTE — Progress Notes (Signed)
 This patient returns to my office for at risk foot care.  This patient requires this care by a professional since this patient will be at risk due to having diabetes.  This patient is unable to cut nails herself since the patient cannot reach her nails.These nails are painful walking and wearing shoes.  She also states she has painful callus on the outside right foot. This patient presents for at risk foot care today.  General Appearance  Alert, conversant and in no acute stress.  Vascular  Dorsalis pedis and posterior tibial  pulses are palpable  bilaterally.  Capillary return is within normal limits  bilaterally. Temperature is within normal limits  bilaterally.  Neurologic  Senn-Weinstein monofilament wire test within normal limits  bilaterally. Muscle power within normal limits bilaterally.  Nails Thick disfigured discolored nails with subungual debris  from second to fifth toes bilaterally. No evidence of bacterial infection or drainage bilaterally.  Orthopedic  No limitations of motion  feet .  No crepitus or effusions noted.  No bony pathology or digital deformities noted.  Skin  normotropic skin with no porokeratosis noted bilaterally.  No signs of infections or ulcers noted.     Onychomycosis  Pain in right toes  Pain in left toes  Consent was obtained for treatment procedures.   Mechanical debridement of nails 2-5  bilaterally performed with a nail nipper.  Filed with dremel without incident.    Return office visit   4 months                   Told patient to return for periodic foot care and evaluation due to potential at risk complications.   Helane Gunther DPM

## 2024-06-05 ENCOUNTER — Ambulatory Visit (INDEPENDENT_AMBULATORY_CARE_PROVIDER_SITE_OTHER): Admitting: Family Medicine

## 2024-06-05 VITALS — BP 138/76 | HR 81 | Ht 62.0 in | Wt 222.0 lb

## 2024-06-05 DIAGNOSIS — M549 Dorsalgia, unspecified: Secondary | ICD-10-CM

## 2024-06-05 MED ORDER — MELOXICAM 10 MG PO CAPS: 10.0000 mg | ORAL_CAPSULE | Freq: Every day | ORAL | 0 refills | Status: DC

## 2024-06-05 NOTE — Progress Notes (Addendum)
    SUBJECTIVE:   CHIEF COMPLAINT / HPI:   Back Pain  Recently started last Friday (4 days ago) while getting up from the commode. She has had back pain flares more often in the last month. She has had chronic back pain on and off since an accident 30 years ago. No radiation down the legs, but hurts at the lower right back going into the buttocks area. No weakness, numbness, or tingling in the legs. No bowel or bladder incontinence. No perineal numbness.   PERTINENT  PMH / PSH: History of lung cancer status post VATS, COPD  OBJECTIVE:   BP 138/76   Pulse 81   Ht 5' 2 (1.575 m)   Wt 222 lb (100.7 kg)   SpO2 94%   BMI 40.60 kg/m   General: well appearing, in no acute distress CV: Peers well-perfused Resp: Normal work of breathing on room air Abd: non distended  Neuro: Alert & Oriented x 4, upper extremity strength 5 out of 5, lower extremity strength 5 out of 5, patellar reflexes 2 out of 4 bilaterally, lower extremities sensation intact throughout bilaterally MSK: No tenderness over the midline spine and thoracic, lumbar, or sacral regions, paravertebral tenderness to palpation on the right side of lumbar spine with contracted muscle, no erythema, no warmth   ASSESSMENT/PLAN:   Assessment & Plan Musculoskeletal back pain Most likely patient had a sprain of her right paravertebral lumbar musculature.  Reassuring that there are no red flag neurological symptoms and no pain in the midline area. - Meloxicam  10 mg daily for 7 days - Continue heating pad use and lidocaine  patches as needed - Offered patient some exercises/stretches for her lumbar back pain that 1 can do sitting. - Counseled patient that core strengthening can help prevent lumbar back strain in the future.  Patient will consider physical therapy in the future. - If back pain continues or does not improve can consider spinal x-rays for compression fracture given age and comorbidities.     Areta Saliva, MD [facility]

## 2024-06-05 NOTE — Patient Instructions (Addendum)
 It was wonderful to see you today.  Please bring ALL of your medications with you to every visit.   Today we talked about:  Back Pain - This is due to a tightening of one of your lower back muscles. I have given you a medication called meloxicam  it is an NSAID this is a medicine that is anti-inflammatory and helps with pain relief. Use this with food and drink lots of water . You can also use heat and lidocaine  patches as needed. Keep moving. Moving will help relax the muscle.  You can also consider physical therapy to help these flares happen less often.    Thank you for choosing [facility].   Please call [phone] with any questions about today's appointment.  Areta Saliva, MD  Family Medicine

## 2024-06-05 NOTE — Assessment & Plan Note (Signed)
 Most likely patient had a sprain of her right paravertebral lumbar musculature.  Reassuring that there are no red flag neurological symptoms and no pain in the midline area. - Meloxicam  10 mg daily for 7 days - Continue heating pad use and lidocaine  patches as needed - Offered patient some exercises/stretches for her lumbar back pain that 1 can do sitting. - Counseled patient that core strengthening can help prevent lumbar back strain in the future.  Patient will consider physical therapy in the future. - If back pain continues or does not improve can consider spinal x-rays for compression fracture given age and comorbidities.

## 2024-06-07 ENCOUNTER — Telehealth: Payer: Self-pay

## 2024-06-07 ENCOUNTER — Telehealth: Payer: Self-pay | Admitting: Family Medicine

## 2024-06-07 ENCOUNTER — Other Ambulatory Visit (HOSPITAL_COMMUNITY): Payer: Self-pay

## 2024-06-07 DIAGNOSIS — M549 Dorsalgia, unspecified: Secondary | ICD-10-CM

## 2024-06-07 NOTE — Telephone Encounter (Signed)
 Patient calls nurse line regarding meloxicam  dosage.   She reports that 10 mg is not covered, however, pharmacist told her that 7.5 or 15 mg would be covered.   She is asking if alternative dosage can be sent to pharmacy.   I also advised patient of message from Dr. Nicholas regarding Spine X-ray. Patient is agreeable to imaging.   Chiquita JAYSON English, RN

## 2024-06-07 NOTE — Telephone Encounter (Signed)
 Called patient x 2 to ask if patient would like to have a lumbar spine xray to evaluate for compression fracture given age and smoking history.  Patient did not answer the phone.

## 2024-06-07 NOTE — Telephone Encounter (Signed)
 Pharmacy Patient Advocate Encounter   Received notification from CoverMyMeds that prior authorization for MELOXICAM  10MG  CAPSULES is required/requested.   Insurance verification completed.   The patient is insured through [facility] .   PA required; PA submitted to above mentioned insurance via CoverMyMeds Key/confirmation #/EOC [id number]. Status is pending   (Tablets preferred)

## 2024-06-08 ENCOUNTER — Ambulatory Visit (HOSPITAL_COMMUNITY)
Admission: RE | Admit: 2024-06-08 | Discharge: 2024-06-08 | Disposition: A | Discharge status: Home / Self Care | Source: Ambulatory Visit | Attending: Family Medicine | Admitting: Family Medicine

## 2024-06-08 DIAGNOSIS — I700 Atherosclerosis of aorta: Secondary | ICD-10-CM | POA: Diagnosis not present

## 2024-06-08 DIAGNOSIS — M47816 Spondylosis without myelopathy or radiculopathy, lumbar region: Secondary | ICD-10-CM | POA: Diagnosis not present

## 2024-06-08 DIAGNOSIS — M48061 Spinal stenosis, lumbar region without neurogenic claudication: Secondary | ICD-10-CM | POA: Diagnosis not present

## 2024-06-08 DIAGNOSIS — M549 Dorsalgia, unspecified: Secondary | ICD-10-CM | POA: Insufficient documentation

## 2024-06-08 DIAGNOSIS — M5136 Other intervertebral disc degeneration, lumbar region with discogenic back pain only: Secondary | ICD-10-CM | POA: Diagnosis not present

## 2024-06-08 MED ORDER — MELOXICAM 7.5 MG PO TABS: 7.5000 mg | ORAL_TABLET | Freq: Every day | ORAL | 0 refills | Status: DC

## 2024-06-08 NOTE — Telephone Encounter (Signed)
 Called patient and informed of message per Dr. Nicholas.   All questions answered.   Chiquita JAYSON English, RN

## 2024-06-11 ENCOUNTER — Other Ambulatory Visit (HOSPITAL_COMMUNITY): Payer: Self-pay

## 2024-06-11 NOTE — Telephone Encounter (Signed)
 Pharmacy Patient Advocate Encounter  Received notification from [facility] that Prior Authorization for MELOXICAM  CAPSULES has been DENIED.  Full denial letter will be uploaded to the media tab. See denial reason below.    Tablets preferred  PA #/Case ID/Reference #: [id number]

## 2024-06-13 ENCOUNTER — Encounter (HOSPITAL_COMMUNITY): Payer: Self-pay

## 2024-06-13 ENCOUNTER — Ambulatory Visit (HOSPITAL_COMMUNITY)
Admission: RE | Admit: 2024-06-13 | Discharge: 2024-06-13 | Disposition: A | Discharge status: Home / Self Care | Source: Ambulatory Visit | Attending: Internal Medicine | Admitting: Internal Medicine

## 2024-06-13 ENCOUNTER — Inpatient Hospital Stay: Payer: 59 | Attending: Internal Medicine

## 2024-06-13 DIAGNOSIS — Z08 Encounter for follow-up examination after completed treatment for malignant neoplasm: Secondary | ICD-10-CM | POA: Insufficient documentation

## 2024-06-13 DIAGNOSIS — C3490 Malignant neoplasm of unspecified part of unspecified bronchus or lung: Secondary | ICD-10-CM | POA: Diagnosis not present

## 2024-06-13 DIAGNOSIS — R161 Splenomegaly, not elsewhere classified: Secondary | ICD-10-CM | POA: Insufficient documentation

## 2024-06-13 DIAGNOSIS — J449 Chronic obstructive pulmonary disease, unspecified: Secondary | ICD-10-CM | POA: Diagnosis not present

## 2024-06-13 DIAGNOSIS — Z902 Acquired absence of lung [part of]: Secondary | ICD-10-CM | POA: Insufficient documentation

## 2024-06-13 DIAGNOSIS — D751 Secondary polycythemia: Secondary | ICD-10-CM | POA: Diagnosis not present

## 2024-06-13 DIAGNOSIS — Z85118 Personal history of other malignant neoplasm of bronchus and lung: Secondary | ICD-10-CM | POA: Diagnosis not present

## 2024-06-13 LAB — CBC WITH DIFFERENTIAL (CANCER CENTER ONLY)
Abs Immature Granulocytes: 0.01 K/uL (ref 0.00–0.07)
Basophils Absolute: 0.1 K/uL (ref 0.0–0.1)
Basophils Relative: 1 %
Eosinophils Absolute: 0.2 K/uL (ref 0.0–0.5)
Eosinophils Relative: 3 %
HCT: 50 % — ABNORMAL HIGH (ref 36.0–46.0)
Hemoglobin: 16.7 g/dL — ABNORMAL HIGH (ref 12.0–15.0)
Immature Granulocytes: 0 %
Lymphocytes Relative: 46 %
Lymphs Abs: 2.9 K/uL (ref 0.7–4.0)
MCH: 28 pg (ref 26.0–34.0)
MCHC: 33.4 g/dL (ref 30.0–36.0)
MCV: 83.8 fL (ref 80.0–100.0)
Monocytes Absolute: 0.7 K/uL (ref 0.1–1.0)
Monocytes Relative: 11 %
Neutro Abs: 2.4 K/uL (ref 1.7–7.7)
Neutrophils Relative %: 39 %
Platelet Count: 179 K/uL (ref 150–400)
RBC: 5.97 MIL/uL — ABNORMAL HIGH (ref 3.87–5.11)
RDW: 15.4 % (ref 11.5–15.5)
WBC Count: 6.2 K/uL (ref 4.0–10.5)
nRBC: 0 % (ref 0.0–0.2)

## 2024-06-13 LAB — CMP (CANCER CENTER ONLY)
ALT: 46 U/L — ABNORMAL HIGH (ref 0–44)
AST: 39 U/L (ref 15–41)
Albumin: 4.1 g/dL (ref 3.5–5.0)
Alkaline Phosphatase: 88 U/L (ref 38–126)
Anion gap: 7 (ref 5–15)
BUN: 10 mg/dL (ref 8–23)
CO2: 30 mmol/L (ref 22–32)
Calcium: 9.4 mg/dL (ref 8.9–10.3)
Chloride: 102 mmol/L (ref 98–111)
Creatinine: 0.58 mg/dL (ref 0.44–1.00)
GFR, Estimated: >60 mL/min (ref >60)
Glucose, Bld: 142 mg/dL — ABNORMAL HIGH (ref 70–99)
Potassium: 3.3 mmol/L — ABNORMAL LOW (ref 3.5–5.1)
Sodium: 139 mmol/L (ref 135–145)
Total Bilirubin: 0.7 mg/dL (ref 0.0–1.2)
Total Protein: 7.2 g/dL (ref 6.5–8.1)

## 2024-06-13 MED ORDER — SODIUM CHLORIDE (PF) 0.9 % IJ SOLN
INTRAMUSCULAR | Status: AC
  Filled 2024-06-13: qty 50

## 2024-06-13 MED ORDER — IOHEXOL 300 MG/ML  SOLN
75.0000 mL | Freq: Once | INTRAMUSCULAR | Status: AC | PRN
  Administered 2024-06-13 (×2): 75 mL via INTRAVENOUS

## 2024-06-14 ENCOUNTER — Ambulatory Visit: Payer: 59 | Admitting: Internal Medicine

## 2024-06-19 ENCOUNTER — Ambulatory Visit: Payer: 59 | Admitting: Internal Medicine

## 2024-06-20 ENCOUNTER — Inpatient Hospital Stay (HOSPITAL_BASED_OUTPATIENT_CLINIC_OR_DEPARTMENT_OTHER): Payer: Self-pay | Admitting: Internal Medicine

## 2024-06-20 ENCOUNTER — Inpatient Hospital Stay

## 2024-06-20 VITALS — BP 120/65 | HR 75 | Temp 97.8°F | Resp 17 | Ht 62.0 in | Wt 223.0 lb

## 2024-06-20 DIAGNOSIS — C3490 Malignant neoplasm of unspecified part of unspecified bronchus or lung: Secondary | ICD-10-CM | POA: Diagnosis not present

## 2024-06-20 DIAGNOSIS — D45 Polycythemia vera: Secondary | ICD-10-CM | POA: Diagnosis not present

## 2024-06-20 DIAGNOSIS — Z08 Encounter for follow-up examination after completed treatment for malignant neoplasm: Secondary | ICD-10-CM | POA: Diagnosis not present

## 2024-06-20 LAB — IRON AND IRON BINDING CAPACITY (CC-WL,HP ONLY)
Iron: 96 ug/dL (ref 28–170)
Saturation Ratios: 30 % (ref 10.4–31.8)
TIBC: 323 ug/dL (ref 250–450)
UIBC: 227 ug/dL (ref 148–442)

## 2024-06-20 LAB — FERRITIN: Ferritin: 289 ng/mL (ref 11–307)

## 2024-06-20 NOTE — Progress Notes (Signed)
 [facility] Telephone:([phone]) [phone]   Fax:([phone]  OFFICE PROGRESS NOTE  Sonya Perkins, DO [address] [place] [address]  DIAGNOSIS:   1) Stage IA (T1b, N0, M0) non-small cell lung cancer, adenocarcinoma presented with right upper lobe lung nodule diagnosed in October 2022. 2) polycythemia suspicious for polycythemia vera  PRIOR THERAPY: Status post  right upper lobectomy and lymph node dissection under the care of Dr. Kerrin on 08/14/2021.  CURRENT THERAPY: Observation.  INTERVAL HISTORY: Sonya Walton 63 y.o. female returns to the clinic today for annual follow-up visit.Discussed the use of AI scribe software for clinical note transcription with the patient, who gave verbal consent to proceed.  History of Present Illness Sonya Walton is a 63 year old female with stage one small cell lung cancer who presents for evaluation with repeat CT scan for restaging of her disease.  She has a history of stage one small cell lung cancer, adenocarcinoma, and underwent a right upper lobectomy with lymph node dissection in October 2022. Since then, she has been on observation. Recent CT scan of the chest showed no new spots or growths.  She is on Spiriva  for breathing issues related to her history of COPD. No new chest pain, increased shortness of breath, hemoptysis, nausea, vomiting, or changes in vision. She experiences occasional headaches, which she attributes to sinus pressure.  Her current medications include Spiriva  and aspirin  81 mg. She has abnormal hemoglobin and hematocrit levels, with a consistently high red blood cell count and an enlarged spleen noted on the scan.    MEDICAL HISTORY: Past Medical History:  Diagnosis Date   Arthritis    Cancer (HCC)    Coronary artery disease    mild by CT 02/05/21   Diabetes mellitus without complication (HCC)    Hypertension    states under control with med., has been on med. since 2011   Wears  dentures    upper    ALLERGIES:  is allergic to percocet [oxycodone -acetaminophen ] and clotrimazole .  MEDICATIONS:  Current Outpatient Medications  Medication Sig Dispense Refill   Accu-Chek Softclix Lancets lancets Use as instructed 100 each 12   acetaminophen  (TYLENOL ) 500 MG tablet Take 500 mg by mouth every 6 (six) hours as needed for mild pain or moderate pain.     aspirin  EC 81 MG tablet Take 1 tablet (81 mg total) by mouth daily. Swallow whole. 90 tablet 3   atenolol -chlorthalidone  (TENORETIC ) 50-25 MG tablet Take 1 tablet by mouth daily. 90 tablet 3   blood glucose meter kit and supplies KIT Inject 1 each into the skin as directed. Use meter once daily to measure blood glucose. 1 each 0   Cholecalciferol  (VITAMIN D3) 2000 UNITS TABS Take 2,000 Units by mouth every morning.     empagliflozin  (JARDIANCE ) 10 MG TABS tablet Take 1 tablet (10 mg total) by mouth daily. 30 tablet 11   glucose blood (ACCU-CHEK GUIDE TEST) test strip Use as instructed 100 each 12   insulin  glargine (LANTUS ) 100 UNIT/ML Solostar Pen Inject 22 Units into the skin daily.     Insulin  Pen Needle (RELION PEN NEEDLES) 32G X 4 MM MISC USE 1 NEEDLE DAILY AS DIRECTED 1000 each 5   meloxicam  (MOBIC ) 7.5 MG tablet Take 1 tablet (7.5 mg total) by mouth daily. 7 tablet 0   nitroGLYCERIN  (NITROSTAT ) 0.4 MG SL tablet Place 0.4 mg under the tongue every 5 (five) minutes as needed for chest pain.  potassium chloride  SA (KLOR-CON  M) 20 MEQ tablet Take 2 tablets (40 mEq total) by mouth daily. 180 tablet 3   rosuvastatin  (CRESTOR ) 10 MG tablet Take 1 tablet (10 mg total) by mouth daily. 90 tablet 3   Tiotropium Bromide Monohydrate  (SPIRIVA  RESPIMAT) 2.5 MCG/ACT AERS Inhale 2 puffs into the lungs daily. 1 each 11   No current facility-administered medications for this visit.    SURGICAL HISTORY:  Past Surgical History:  Procedure Laterality Date   BREAST BIOPSY Left    BRONCHIAL BIOPSY  03/09/2021   Procedure: BRONCHIAL  BIOPSIES;  Surgeon: Shelah Lamar RAMAN, MD;  Location: [facility] ENDOSCOPY;  Service: Pulmonary;;   BRONCHIAL BRUSHINGS  03/09/2021   Procedure: BRONCHIAL BRUSHINGS;  Surgeon: Shelah Lamar RAMAN, MD;  Location: [facility] ENDOSCOPY;  Service: Pulmonary;;   BRONCHIAL NEEDLE ASPIRATION BIOPSY  03/09/2021   Procedure: BRONCHIAL NEEDLE ASPIRATION BIOPSIES;  Surgeon: Shelah Lamar RAMAN, MD;  Location: [facility] ENDOSCOPY;  Service: Pulmonary;;   BUNIONECTOMY Bilateral 1994   CHOLECYSTECTOMY  01/17/2012   Procedure: LAPAROSCOPIC CHOLECYSTECTOMY;  Surgeon: Vicenta DELENA Poli, MD;  Location: [facility] OR;  Service: General;  Laterality: N/A;   FIDUCIAL MARKER PLACEMENT  03/09/2021   Procedure: FIDUCIAL MARKER PLACEMENT;  Surgeon: Shelah Lamar RAMAN, MD;  Location: [facility] ENDOSCOPY;  Service: Pulmonary;;   INTERCOSTAL NERVE BLOCK Right 08/14/2021   Procedure: INTERCOSTAL NERVE BLOCK;  Surgeon: Kerrin Elspeth BROCKS, MD;  Location: [facility] OR;  Service: Thoracic;  Laterality: Right;   NODE DISSECTION Right 08/14/2021   Procedure: NODE DISSECTION;  Surgeon: Kerrin Elspeth BROCKS, MD;  Location: [facility] OR;  Service: Thoracic;  Laterality: Right;   PUBOVAGINAL SLING  03/05/2003   right upper lobectomy  08/14/2021   SHOULDER ARTHROSCOPY WITH DISTAL CLAVICLE RESECTION Right 01/26/2016   Procedure: SHOULDER ARTHROSCOPY WITH DISTAL CLAVICLE RESECTION;  Surgeon: Eva Herring, MD;  Location: [facility];  Service: Orthopedics;  Laterality: Right;   SHOULDER ARTHROSCOPY WITH SUBACROMIAL DECOMPRESSION Right 01/26/2016   Procedure: SHOULDER ARTHROSCOPY WITH SUBACROMIAL DECOMPRESSION DEBRIDEMENT;  Surgeon: Eva Herring, MD;  Location: [facility];  Service: Orthopedics;  Laterality: Right;   TONSILLECTOMY  1975   TOOTH EXTRACTION N/A 12/23/2023   Procedure: DENTAL RESTORATION/EXTRACTIONS;  Surgeon: Sheryle Hamilton, DMD;  Location: [facility] OR;  Service: Oral Surgery;  Laterality: N/A;   TOTAL VAGINAL HYSTERECTOMY  03/05/2003   TUBAL LIGATION   1981   VAGINAL HYSTERECTOMY  2004   partial    VIDEO BRONCHOSCOPY WITH ENDOBRONCHIAL NAVIGATION N/A 03/09/2021   Procedure: VIDEO BRONCHOSCOPY WITH ENDOBRONCHIAL NAVIGATION;  Surgeon: Shelah Lamar RAMAN, MD;  Location: [facility] ENDOSCOPY;  Service: Pulmonary;  Laterality: N/A;    REVIEW OF SYSTEMS:  Constitutional: positive for fatigue Eyes: negative Ears, nose, mouth, throat, and face: negative Respiratory: positive for dyspnea on exertion Cardiovascular: negative Gastrointestinal: negative Genitourinary:negative Integument/breast: negative Hematologic/lymphatic: negative Musculoskeletal:negative Neurological: negative Behavioral/Psych: negative Endocrine: negative Allergic/Immunologic: negative   PHYSICAL EXAMINATION: General appearance: alert, cooperative, and no distress Head: Normocephalic, without obvious abnormality, atraumatic Neck: no adenopathy, no JVD, supple, symmetrical, trachea midline, and thyroid  not enlarged, symmetric, no tenderness/mass/nodules Lymph nodes: Cervical, supraclavicular, and axillary nodes normal. Resp: clear to auscultation bilaterally Back: symmetric, no curvature. ROM normal. No CVA tenderness. Cardio: regular rate and rhythm, S1, S2 normal, no murmur, click, rub or gallop GI: soft, non-tender; bowel sounds normal; no masses,  no organomegaly Extremities: extremities normal, atraumatic, no cyanosis or edema Neurologic: Alert and oriented X 3, normal strength and tone. Normal symmetric reflexes. Normal coordination and gait  ECOG PERFORMANCE STATUS: 1 - Symptomatic but completely ambulatory  Blood pressure 120/65, pulse 75, temperature 97.8 F (36.6 C), temperature source Temporal, resp. rate 17, height 5' 2 (1.575 m), weight 223 lb (101.2 kg), SpO2 95%.  LABORATORY DATA: Lab Results  Component Value Date   WBC 6.2 06/13/2024   HGB 16.7 (H) 06/13/2024   HCT 50.0 (H) 06/13/2024   MCV 83.8 06/13/2024   PLT 179 06/13/2024      Chemistry       Component Value Date/Time   NA 139 06/13/2024 1019   NA 141 03/21/2024 1109   K 3.3 (L) 06/13/2024 1019   CL 102 06/13/2024 1019   CO2 30 06/13/2024 1019   BUN 10 06/13/2024 1019   BUN 10 03/21/2024 1109   CREATININE 0.58 06/13/2024 1019   CREATININE 0.69 04/12/2016 0953   GLU 160 (H) 06/29/2021 1022      Component Value Date/Time   CALCIUM  9.4 06/13/2024 1019   ALKPHOS 88 06/13/2024 1019   AST 39 06/13/2024 1019   ALT 46 (H) 06/13/2024 1019   BILITOT 0.7 06/13/2024 1019       RADIOGRAPHIC STUDIES: CT Chest W Contrast Result Date: 06/16/2024 CLINICAL DATA:  Non-small-cell lung cancer restaging, status post right upper lobectomy * Tracking Code: BO * EXAM: CT CHEST WITH CONTRAST TECHNIQUE: Multidetector CT imaging of the chest was performed during intravenous contrast administration. RADIATION DOSE REDUCTION: This exam was performed according to the departmental dose-optimization program which includes automated exposure control, adjustment of the mA and/or kV according to patient size and/or use of iterative reconstruction technique. CONTRAST:  75mL OMNIPAQUE  IOHEXOL  300 MG/ML  SOLN COMPARISON:  06/13/2023 FINDINGS: Cardiovascular: Aortic atherosclerosis. Normal heart size. Three-vessel coronary artery calcifications. No pericardial effusion. Mediastinum/Nodes: No enlarged mediastinal, hilar, or axillary lymph nodes. Multinodular thyroid  goiter, previously biopsied, no specific imaging follow-up required. Trachea, and esophagus demonstrate no significant findings. Lungs/Pleura: Status post right upper lobectomy. Unchanged complete atelectasis of the right middle lobe. Unchanged 0.7 cm ground-glass nodule of the peripheral posterior right lower lobe (series 8, image 63). No pleural effusion or pneumothorax. Upper Abdomen: No acute abnormality. Hepatic steatosis. Coarse contour of the liver. Cholecystectomy. Partially imaged splenomegaly. Musculoskeletal: Unchanged nodule in the medial left  breast (series 2, image 71). No acute osseous findings. IMPRESSION: 1. Status post right upper lobectomy. 2. Unchanged 0.7 cm ground-glass nodule of the peripheral posterior right lower lobe. Attention on follow-up. 3. No evidence of lymphadenopathy or metastatic disease in the chest. 4. Unchanged nodule in the medial left breast. Correlate with mammography. 5. Hepatic steatosis and cirrhosis.  Splenomegaly. 6. Coronary artery disease. Aortic Atherosclerosis (ICD10-I70.0). Electronically Signed   By: Marolyn JONETTA Jaksch M.D.   On: 06/16/2024 21:30    ASSESSMENT AND PLAN: This is a very pleasant 63 years old white female diagnosed with a stage IA (T1b, N0, M0) non-small cell lung cancer, adenocarcinoma presented with right upper lobe lung nodule diagnosed in October 2022 status post right upper lobectomy with lymph node dissection under the care of Dr. Kerrin on August 14, 2021. The patient is currently on observation and she is feeling fine with no concerning complaints except for the baseline fatigue and shortness of breath with exertion. She had repeat CT scan of the chest performed recently.  I personally and independently reviewed the scan and discussed the result with the patient today. Her scan showed no concerning findings for disease recurrence or metastasis. Assessment and Plan Assessment & Plan History of right upper lobe  lung adenocarcinoma, post-lobectomy, under surveillance Stage 1 N small cell lung cancer, adenocarcinoma, status post right upper lobectomy with lymph node dissection in October 2022. Currently under observation with no new growths or recurrence on recent CT scan. - Continue surveillance with regular CT scans.  Erythrocytosis and splenomegaly, evaluation for polycythemia vera Elevated red blood cells and splenomegaly. Differential diagnosis includes polycythemia vera versus secondary erythrocytosis due to COPD. JAK2 mutation test is necessary to differentiate between these  conditions. - Order JAK2 mutation test. - Advise reduction in red meat and iron-rich foods. - If JAK2 mutation test is positive, plan for phlebotomy to reduce red blood cell count. - If JAK2 mutation test is negative, follow up in one year with regular scan.  Chronic obstructive pulmonary disease (COPD) COPD managed with Spiriva . No new symptoms such as increased shortness of breath, chest pain, or hemoptysis reported. - Continue current management with Spiriva . She was advised to call immediately if she has any other concerning symptoms in the interval.  The patient voices understanding of current disease status and treatment options and is in agreement with the current care plan.  All questions were answered. The patient knows to call the clinic with any problems, questions or concerns. We can certainly see the patient much sooner if necessary.  The total time spent in the appointment was 30 minutes.  Disclaimer: This note was dictated with voice recognition software. Similar sounding words can inadvertently be transcribed and may not be corrected upon review.

## 2024-06-21 ENCOUNTER — Other Ambulatory Visit: Payer: Self-pay | Admitting: Student

## 2024-06-21 ENCOUNTER — Telehealth: Payer: Self-pay | Admitting: Internal Medicine

## 2024-06-21 ENCOUNTER — Telehealth: Payer: Self-pay | Admitting: Medical Oncology

## 2024-06-21 DIAGNOSIS — I10 Essential (primary) hypertension: Secondary | ICD-10-CM

## 2024-06-21 NOTE — Telephone Encounter (Signed)
 Reviewed anemia labs with pt and told her the JAK-2 blood test result will take 10-14 days and someone will call her with the result.

## 2024-06-21 NOTE — Telephone Encounter (Signed)
 Scheduled appointments with the patient per LOS notes.

## 2024-06-22 ENCOUNTER — Ambulatory Visit: Payer: Self-pay | Admitting: Family Medicine

## 2024-07-01 LAB — NGS JAK2 E12-15/CALR/MPL

## 2024-07-04 ENCOUNTER — Telehealth: Payer: Self-pay | Admitting: Medical Oncology

## 2024-07-04 NOTE — Telephone Encounter (Signed)
 Pt notified of Jak -2 mutation is negative. Next appt 1 year.

## 2024-07-17 ENCOUNTER — Other Ambulatory Visit: Payer: Self-pay | Admitting: Family Medicine

## 2024-07-17 DIAGNOSIS — M549 Dorsalgia, unspecified: Secondary | ICD-10-CM

## 2024-07-24 ENCOUNTER — Other Ambulatory Visit: Payer: Self-pay | Admitting: Student

## 2024-07-24 DIAGNOSIS — E119 Type 2 diabetes mellitus without complications: Secondary | ICD-10-CM

## 2024-08-06 ENCOUNTER — Telehealth: Payer: Self-pay

## 2024-08-06 DIAGNOSIS — N952 Postmenopausal atrophic vaginitis: Secondary | ICD-10-CM

## 2024-08-06 MED ORDER — ESTROGENS CONJUGATED 0.625 MG/GM VA CREA: 1.0000 | TOPICAL_CREAM | Freq: Every day | VAGINAL | 12 refills | Status: AC

## 2024-08-06 NOTE — Telephone Encounter (Signed)
 Patient calls nurse line requesting a refill on Premarin .   She reports this was given to her earlier this year to help with vaginal symptoms.   She reports vaginal itching and dryness.   Advised will send to PCP to review.   Please send to [facility] on [facility] and [address].

## 2024-08-06 NOTE — Telephone Encounter (Signed)
 Cream rx sent to pharmacy   Damien Pinal, DO [facility], PGY-3 08/06/24 7:28 PM

## 2024-08-09 ENCOUNTER — Encounter: Payer: Self-pay | Admitting: Podiatry

## 2024-08-09 ENCOUNTER — Ambulatory Visit: Admitting: Podiatry

## 2024-08-09 DIAGNOSIS — E1142 Type 2 diabetes mellitus with diabetic polyneuropathy: Secondary | ICD-10-CM

## 2024-08-09 DIAGNOSIS — B351 Tinea unguium: Secondary | ICD-10-CM

## 2024-08-09 DIAGNOSIS — M79674 Pain in right toe(s): Secondary | ICD-10-CM

## 2024-08-09 DIAGNOSIS — M79675 Pain in left toe(s): Secondary | ICD-10-CM

## 2024-08-09 DIAGNOSIS — L84 Corns and callosities: Secondary | ICD-10-CM

## 2024-08-09 NOTE — Progress Notes (Signed)
 This patient returns to my office for at risk foot care.  This patient requires this care by a professional since this patient will be at risk due to having diabetes.  This patient is unable to cut nails herself since the patient cannot reach her nails.These nails are painful walking and wearing shoes.  She also states she has painful callus on the outside right foot. This patient presents for at risk foot care today.  General Appearance  Alert, conversant and in no acute stress.  Vascular  Dorsalis pedis and posterior tibial  pulses are palpable  bilaterally.  Capillary return is within normal limits  bilaterally. Temperature is within normal limits  bilaterally.  Neurologic  Senn-Weinstein monofilament wire test within normal limits  bilaterally. Muscle power within normal limits bilaterally.  Nails Thick disfigured discolored nails with subungual debris  from second to fifth toes bilaterally. No evidence of bacterial infection or drainage bilaterally.  Orthopedic  No limitations of motion  feet .  No crepitus or effusions noted.  No bony pathology or digital deformities noted.  Skin  normotropic skin with no porokeratosis noted bilaterally.  No signs of infections or ulcers noted.     Onychomycosis  Pain in right toes  Pain in left toes  Consent was obtained for treatment procedures.   Mechanical debridement of nails 2-5  bilaterally performed with a nail nipper.  Filed with dremel without incident.    Return office visit   4 months                   Told patient to return for periodic foot care and evaluation due to potential at risk complications.   Helane Gunther DPM

## 2024-09-07 ENCOUNTER — Encounter: Payer: Self-pay | Admitting: Internal Medicine

## 2024-09-07 ENCOUNTER — Ambulatory Visit: Attending: Internal Medicine | Admitting: Internal Medicine

## 2024-09-07 VITALS — BP 113/74 | HR 68 | Ht 62.0 in | Wt 224.0 lb

## 2024-09-07 DIAGNOSIS — E785 Hyperlipidemia, unspecified: Secondary | ICD-10-CM | POA: Insufficient documentation

## 2024-09-07 DIAGNOSIS — I25118 Atherosclerotic heart disease of native coronary artery with other forms of angina pectoris: Secondary | ICD-10-CM | POA: Diagnosis not present

## 2024-09-07 DIAGNOSIS — R0609 Other forms of dyspnea: Secondary | ICD-10-CM | POA: Insufficient documentation

## 2024-09-07 DIAGNOSIS — Z794 Long term (current) use of insulin: Secondary | ICD-10-CM | POA: Insufficient documentation

## 2024-09-07 DIAGNOSIS — E119 Type 2 diabetes mellitus without complications: Secondary | ICD-10-CM | POA: Diagnosis not present

## 2024-09-07 DIAGNOSIS — I10 Essential (primary) hypertension: Secondary | ICD-10-CM | POA: Diagnosis not present

## 2024-09-07 MED ORDER — METOPROLOL TARTRATE 50 MG PO TABS
ORAL_TABLET | ORAL | 0 refills | Status: AC
Start: 2024-09-07 — End: ?

## 2024-09-07 MED ORDER — METOPROLOL TARTRATE 100 MG PO TABS: ORAL_TABLET | ORAL | 0 refills | Status: DC

## 2024-09-07 NOTE — Progress Notes (Signed)
 Cardiology Office Note   Date:  09/07/2024  ID:  VERDIA BOLT, DOB 1961-03-22, MRN [id number] PCP: Cleotilde Perkins, DO  [facility] Providers Cardiologist:  Emeline FORBES Calender, MD     History of Present Illness Sonya Walton is a 63 y.o. female with a past medical history of type 2 diabetes, hypertension, nonobstructive CAD by CTA in 2022, aortic atherosclerosis, former tobacco use, s/p partial pneumonectomy, prior Dr. Alveta patient who presents for annual follow-up.  She was last seen on 11//24 by Dr. Alveta for hypertension and hyperlipidemia.  No changes were made.  Today, she presents with her daughter.  Patient states that since her lung surgery several years ago she has had chronic shortness of breath and because of this she has not been able to lay flat and sleeps in a recliner.  She has not been needing to increase her incline she also has moved from house to an apartment.  Daughter notes that since moving to the apartment she has seemingly become more short of breath and patient states she has also gained more weight.  Patient does not believe her symptoms have worsened over time but daughter states she has noted a change with minimal exertion.  Patient's diet consists of breakfast-eggs, grits, bacon or sausage; lunch-frequently [employer]; dinner-pork chops; snacks-chips, baked goods, etc.   She denies tobacco use and admits to very rare alcohol use    ROS:  Review of Systems  All other systems reviewed and are negative.   Physical Exam  Physical Exam Vitals and nursing note reviewed.  Constitutional:      Appearance: Normal appearance.  HENT:     Head: Normocephalic and atraumatic.  Eyes:     Conjunctiva/sclera: Conjunctivae normal.  Neck:     Vascular: No carotid bruit.  Cardiovascular:     Rate and Rhythm: Normal rate and regular rhythm.  Pulmonary:     Effort: Pulmonary effort is normal.     Breath sounds: Normal breath sounds.  Musculoskeletal:         General: No swelling or tenderness.  Skin:    Coloration: Skin is not jaundiced or pale.  Neurological:     Mental Status: She is alert.     VS:  BP 113/74   Pulse 68   Ht 5' 2 (1.575 m)   Wt 224 lb (101.6 kg)   SpO2 93%   BMI 40.97 kg/m         Wt Readings from Last 3 Encounters:  09/07/24 224 lb (101.6 kg)  06/20/24 223 lb (101.2 kg)  06/05/24 222 lb (100.7 kg)     EKG Interpretation Date/Time:  Friday September 07 2024 11:23:28 EST Ventricular Rate:  68 PR Interval:  186 QRS Duration:  102 QT Interval:  424 QTC Calculation: 450 R Axis:   76  Text Interpretation: Normal sinus rhythm Normal ECG When compared with ECG of 05-Sep-2023 11:29, No significant change was found Confirmed by Calender Emeline [phone]) on 09/07/2024 11:33:10 AM    Studies Reviewed   Echocardiogram/28/23:    1. Left ventricular ejection fraction, by estimation, is 55 to 60%. The  left ventricle has normal function. The left ventricle has no regional  wall motion abnormalities. The left ventricular internal cavity size was  mildly dilated. Left ventricular  diastolic parameters were normal.   2. Right ventricular systolic function is normal. The right ventricular  size is normal. There is normal pulmonary artery systolic pressure.   3. Left atrial size was  mildly dilated.   4. The mitral valve is normal in structure. Trivial mitral valve  regurgitation. No evidence of mitral stenosis.   5. The aortic valve is tricuspid. Aortic valve regurgitation is not  visualized. No aortic stenosis is present.    Coronary CTA/7/22:  1. Mild nonobstructive CAD, CADRADS = 2. Highest concentration of plaque noted in the transition from the distal left main into the bifurcation of the LAD/LCx, without significant obstruction.   2. Coronary calcium  score of 12. This was 99th percentile for age and sex matched control.   3.  Normal coronary origin with right dominance.   4.  Aortic  atherosclerosis  Risk Assessment/Calculations             ASCVD risk score: The 10-year ASCVD risk score (Arnett DK, et al., 2019) is: 8.3%   Values used to calculate the score:     Age: 63 years     Clincally relevant sex: Female     Is Non-Hispanic African American: No     Diabetic: Yes     Tobacco smoker: No     Systolic Blood Pressure: 113 mmHg     Is BP treated: Yes     HDL Cholesterol: 50 mg/dL     Total Cholesterol: 133 mg/dL   ASSESSMENT  Nonobstructive coronary artery disease by CTA coronary CTA in 2022 showed the highest plaque burden at the distal left main.  She has since had questionably increased dyspnea in the setting of chronic symptoms as well as increased weight gain and has a very poor diet.  Given her comorbidities and potentially progressive symptoms with poor diet as well as the high risk location of the plaque burden I think it is reasonable to update an ischemic evaluation.  Currently on aspirin  and rosuvastatin  Hypertension controlled on atenolol -chlorthalidone  Hyperlipidemia has been controlled in 2024 and on rosuvastatin  10 mg Morbid obesity we discussed weight loss medications but she does not want to be on them long-term Lung cancer s/p partial pneumonectomy Chronic orthopnea, likely related to pneumonectomy Type 2 diabetes  Plan  Repeat coronary CTA with beta-blocker prior per protocol.  If she has not had any significant increase in CAD then her symptoms are likely related to obesity, deconditioning and pneumonectomy Lipid panel Cardiac risk counseling and prevention recommendations advised: Heart healthy/Mediterranean diet with whole grains, fruits, vegetable, fish, lean meats, nuts, and olive oil. Limit salt. Moderate walking, 3-5 times/week for 30-50 minutes each session. Aim for at least 150 minutes.week. Goal should be pace of 3 miles/hour, or walking 1.5 miles in 30 minutes Avoidance of tobacco products. Avoid excess alcohol.  Follow up: 1  year or sooner pending above workup          Signed, Emeline FORBES Calender, MD

## 2024-09-07 NOTE — Patient Instructions (Addendum)
 Medication Instructions:  Metoprolol  100 mg take 1 tablet 2 hours prior to cardiac CT  *If you need a refill on your cardiac medications before your next appointment, please call your pharmacy*  Lab Work: Fasting lipid panel  CBC, BMET  Testing/Procedures:   Your cardiac CT will be scheduled at one of the below locations:   [facility] [address]  [place], [place] [address]  If scheduled at the [facility] at [address], please enter the parking lot using the [address] entrance and use the FREE valet service at the patient drop-off area. Enter the building and check-in with registration on the main floor.    Please follow these instructions carefully (unless otherwise directed):  An IV will be required for this test and Nitroglycerin  will be given.  Hold all erectile dysfunction medications at least 3 days (72 hrs) prior to test. (Ie viagra, cialis, sildenafil, tadalafil, etc)   On the Night Before the Test: Be sure to Drink plenty of water . Do not consume any caffeinated/decaffeinated beverages or chocolate 12 hours prior to your test. Do not take any antihistamines 12 hours prior to your test. On the Day of the Test: Drink plenty of water  until 1 hour prior to the test. Do not eat any food 1 hour prior to test. You may take your regular medications prior to the test.  Take metoprolol  (Lopressor ) two hours prior to test. If you take Furosemide/Hydrochlorothiazide /Spironolactone/Chlorthalidone , please HOLD on the morning of the test. Patients who wear a continuous glucose monitor MUST remove the device prior to scanning. FEMALES- please wear underwire-free bra if available, avoid dresses & tight clothing   After the Test: Drink plenty of water . After receiving IV contrast, you may experience a mild flushed feeling. This is normal. On occasion, you may experience a mild rash up to 24 hours after the test. This is not  dangerous. If this occurs, you can take Benadryl 25 mg, Zyrtec, Claritin , or Allegra and increase your fluid intake. (Patients taking Tikosyn should avoid Benadryl, and may take Zyrtec, Claritin , or Allegra) If you experience trouble breathing, this can be serious. If it is severe call 911 IMMEDIATELY. If it is mild, please call our office.  We will call to schedule your test 2-4 weeks out understanding that some insurance companies will need an authorization prior to the service being performed.   For more information and frequently asked questions, please visit our website : [email or web address]  For non-scheduling related questions, please contact the cardiac imaging nurse navigator should you have any questions/concerns: Cardiac Imaging Nurse Navigators Direct Office Dial: [phone]   For scheduling needs, including cancellations and rescheduling, please call Brittany, [phone].   Follow-Up: At [facility], you and your health needs are our priority.  As part of our continuing mission to provide you with exceptional heart care, our providers are all part of one team.  This team includes your primary Cardiologist (physician) and Advanced Practice Providers or APPs (Physician Assistants and Nurse Practitioners) who all work together to provide you with the care you need, when you need it.  Your next appointment:   1 year(s)  Provider:   Dr. Kriste  We recommend signing up for the patient portal called MyChart.  Sign up information is provided on this After Visit Summary.  MyChart is used to connect with patients for Virtual Visits (Telemedicine).  Patients are able to view lab/test results, encounter notes, upcoming appointments, etc.  Non-urgent  messages can be sent to your provider as well.   To learn more about what you can do with MyChart, go to [email or web address].   Other Instructions  Mediterranean Diet A Mediterranean diet is based on the  traditions of countries on the [employer]. It focuses on eating more: Fruits and vegetables. Whole grains, beans, nuts, and seeds. Heart-healthy fats. These are fats that are good for your heart. It involves eating less: Dairy. Meat and eggs. Processed foods with added sugar, salt, and fat. This type of diet can help prevent certain conditions. It can also improve outcomes if you have a long-term (chronic) disease, such as kidney or heart disease. What are tips for following this plan? Reading food labels Check packaged foods for: The serving size. For foods such as rice and pasta, the serving size is the amount of cooked product, not dry. The total fat. Avoid foods with saturated fat or trans fat. Added sugars, such as corn syrup. Shopping  Try to have a balanced diet. Buy a variety of foods, such as: Fresh fruits and vegetables. You may be able to get these from local farmers markets. You can also buy them frozen. Grains, beans, nuts, and seeds. Some of these can be bought in bulk. Fresh seafood. Poultry and eggs. Low-fat dairy products. Buy whole ingredients instead of foods that have already been packaged. If you can't get fresh seafood, buy precooked frozen shrimp or canned fish, such as tuna, salmon, or sardines. Stock your pantry so you always have certain foods on hand, such as olive oil, canned tuna, canned tomatoes, rice, pasta, and beans. Cooking Cook foods with extra-virgin olive oil instead of using butter or other vegetable oils. Have meat as a side dish. Have vegetables or grains as your main dish. This means having meat in small portions or adding small amounts of meat to foods like pasta or stew. Use beans or vegetables instead of meat in common dishes like chili or lasagna. Try out different cooking methods. Try roasting, broiling, steaming, and sauting vegetables. Add frozen vegetables to soups, stews, pasta, or rice. Add nuts or seeds for added healthy fats  and plant protein at each meal. You can add these to yogurt, salads, or vegetable dishes. Marinate fish or vegetables using olive oil, lemon juice, garlic, and fresh herbs. Meal planning Plan to eat a vegetarian meal one day each week. Try to work up to two vegetarian meals, if possible. Eat seafood two or more times a week. Have healthy snacks on hand. These may include: Vegetable sticks with hummus. Greek yogurt. Fruit and nut trail mix. Eat balanced meals. These should include: Fruit: 2-3 servings a day. Vegetables: 4-5 servings a day. Low-fat dairy: 2 servings a day. Fish, poultry, or lean meat: 1 serving a day. Beans and legumes: 2 or more servings a week. Nuts and seeds: 1-2 servings a day. Whole grains: 6-8 servings a day. Extra-virgin olive oil: 3-4 servings a day. Limit red meat and sweets to just a few servings a month. Lifestyle  Try to cook and eat meals with your family. Drink enough fluid to keep your pee (urine) pale yellow. Be active every day. This includes: Aerobic exercise, which is exercise that causes your heart to beat faster. Examples include running and swimming. Leisure activities like gardening, walking, or housework. Get 7-8 hours of sleep each night. Drink red wine if your provider says you can. A glass of wine is 5 oz (150 mL). You may be allowed to have:  Up to 1 glass a day if you're female and not pregnant. Up to 2 glasses a day if you're female. What foods should I eat? Fruits Apples. Apricots. Avocado. Berries. Bananas. Cherries. Dates. Figs. Grapes. Lemons. Melon. Oranges. Peaches. Plums. Pomegranate. Vegetables Artichokes. Beets. Broccoli. Cabbage. Carrots. Eggplant. Green beans. Chard. Kale. Spinach. Onions. Leeks. Peas. Squash. Tomatoes. Peppers. Radishes. Grains Whole-grain pasta. Brown rice. Bulgur wheat. Polenta. Couscous. Whole-wheat bread. [place] Madeira. Meats and other proteins Beans. Almonds. Sunflower seeds. Pine nuts. Peanuts. Cod.  Salmon. Scallops. Shrimp. Tuna. Tilapia. Clams. Oysters. Eggs. Chicken or turkey without skin. Dairy Low-fat milk. Cheese. Greek yogurt. Fats and oils Extra-virgin olive oil. Avocado oil. Grapeseed oil. Beverages Water . Red wine. Herbal tea. Sweets and desserts Greek yogurt with honey. Baked apples. Poached pears. Trail mix. Seasonings and condiments Basil. Cilantro. Coriander. Cumin. Mint. Parsley. Sage. Rosemary. Tarragon. Garlic. Oregano. Thyme. Pepper. Balsamic vinegar. Tahini. Hummus. Tomato sauce. Olives. Mushrooms. The items listed above may not be all the foods and drinks you can have. Talk to a dietitian to learn more. What foods should I limit? This is a list of foods that should be eaten rarely. Fruits Fruit canned in syrup. Vegetables Deep-fried potatoes, like French fries. Grains Packaged pasta or rice dishes. Cereal with added sugar. Snacks with added sugar. Meats and other proteins Beef. Pork. Lamb. Chicken or turkey with skin. Hot dogs. [place]. Dairy Ice cream. Sour cream. Whole milk. Fats and oils Butter. Canola oil. Vegetable oil. Beef fat (tallow). Lard. Beverages Juice. Sugar-sweetened soft drinks. Beer. Liquor and spirits. Sweets and desserts Cookies. Cakes. Pies. Candy. Seasonings and condiments Mayonnaise. Pre-made sauces and marinades. The items listed above may not be all the foods and drinks you should limit. Talk to a dietitian to learn more. Where to find more information American Heart Association (AHA): heart.org This information is not intended to replace advice given to you by your health care provider. Make sure you discuss any questions you have with your health care provider. Document Revised: 01/30/2023 Document Reviewed: 01/30/2023 Elsevier Patient Education  2024 [employer].

## 2024-09-11 DIAGNOSIS — E119 Type 2 diabetes mellitus without complications: Secondary | ICD-10-CM | POA: Diagnosis not present

## 2024-09-11 DIAGNOSIS — Z794 Long term (current) use of insulin: Secondary | ICD-10-CM | POA: Diagnosis not present

## 2024-09-11 DIAGNOSIS — I25118 Atherosclerotic heart disease of native coronary artery with other forms of angina pectoris: Secondary | ICD-10-CM | POA: Diagnosis not present

## 2024-09-11 DIAGNOSIS — R0609 Other forms of dyspnea: Secondary | ICD-10-CM | POA: Diagnosis not present

## 2024-09-11 DIAGNOSIS — E785 Hyperlipidemia, unspecified: Secondary | ICD-10-CM | POA: Diagnosis not present

## 2024-09-11 DIAGNOSIS — I10 Essential (primary) hypertension: Secondary | ICD-10-CM | POA: Diagnosis not present

## 2024-09-11 LAB — LIPID PANEL
Chol/HDL Ratio: 2.3 ratio (ref 0.0–4.4)
Cholesterol, Total: 124 mg/dL (ref 100–199)
HDL: 53 mg/dL (ref >39)
LDL Chol Calc (NIH): 53 mg/dL (ref 0–99)
Triglycerides: 93 mg/dL (ref 0–149)
VLDL Cholesterol Cal: 18 mg/dL (ref 5–40)

## 2024-09-12 ENCOUNTER — Ambulatory Visit: Payer: Self-pay | Admitting: Internal Medicine

## 2024-09-12 LAB — BASIC METABOLIC PANEL WITH GFR
BUN/Creatinine Ratio: 18 (ref 12–28)
BUN: 12 mg/dL (ref 8–27)
CO2: 24 mmol/L (ref 20–29)
Calcium: 9.3 mg/dL (ref 8.7–10.3)
Chloride: 99 mmol/L (ref 96–106)
Creatinine, Ser: 0.67 mg/dL (ref 0.57–1.00)
Glucose: 118 mg/dL — ABNORMAL HIGH (ref 70–99)
Potassium: 3.7 mmol/L (ref 3.5–5.2)
Sodium: 144 mmol/L (ref 134–144)
eGFR: 98 mL/min/1.73 (ref >59)

## 2024-09-12 LAB — CBC
Hematocrit: 52.7 % — ABNORMAL HIGH (ref 34.0–46.6)
Hemoglobin: 16.6 g/dL — ABNORMAL HIGH (ref 11.1–15.9)
MCH: 28.4 pg (ref 26.6–33.0)
MCHC: 31.5 g/dL (ref 31.5–35.7)
MCV: 90 fL (ref 79–97)
Platelets: 224 x10E3/uL (ref 150–450)
RBC: 5.85 x10E6/uL — ABNORMAL HIGH (ref 3.77–5.28)
RDW: 14.6 % (ref 11.7–15.4)
WBC: 7.4 x10E3/uL (ref 3.4–10.8)

## 2024-09-17 ENCOUNTER — Encounter: Payer: Self-pay | Admitting: Gastroenterology

## 2024-09-17 ENCOUNTER — Ambulatory Visit (INDEPENDENT_AMBULATORY_CARE_PROVIDER_SITE_OTHER): Admitting: Gastroenterology

## 2024-09-17 ENCOUNTER — Telehealth (HOSPITAL_COMMUNITY): Payer: Self-pay | Admitting: *Deleted

## 2024-09-17 VITALS — BP 118/68 | HR 76 | Ht 62.0 in | Wt 220.0 lb

## 2024-09-17 DIAGNOSIS — K7460 Unspecified cirrhosis of liver: Secondary | ICD-10-CM

## 2024-09-17 DIAGNOSIS — Z8719 Personal history of other diseases of the digestive system: Secondary | ICD-10-CM | POA: Diagnosis not present

## 2024-09-17 DIAGNOSIS — Z860100 Personal history of colon polyps, unspecified: Secondary | ICD-10-CM

## 2024-09-17 DIAGNOSIS — R10A1 Flank pain, right side: Secondary | ICD-10-CM | POA: Diagnosis not present

## 2024-09-17 DIAGNOSIS — G8929 Other chronic pain: Secondary | ICD-10-CM

## 2024-09-17 MED ORDER — NA SULFATE-K SULFATE-MG SULF 17.5-3.13-1.6 GM/177ML PO SOLN: 1.0000 | Freq: Once | ORAL | 0 refills | Status: AC

## 2024-09-17 NOTE — Patient Instructions (Addendum)
 Low sodium diet Diet with liver disease [email or web address]  You have been scheduled for an abdominal ultrasound at [facility] (1st floor of hospital) Entrance A (FROM [address] on 09/25/24 at 9:00am. Please arrive 30 minutes prior to your appointment for registration. Make certain not to have anything to eat or drink after midnight prior to your appointment. Should you need to reschedule your appointment, please contact radiology at [phone]. This test typically takes about 30 minutes to perform.  You have been scheduled for an endoscopy and colonoscopy. Please follow the written instructions given to you at your visit today.  If you use inhalers (even only as needed), please bring them with you on the day of your procedure.  DO NOT TAKE 7 DAYS PRIOR TO TEST- Trulicity  (dulaglutide ) Ozempic, Wegovy (semaglutide) Mounjaro, Zepbound (tirzepatide) Bydureon Bcise (exanatide extended release)  DO NOT TAKE 1 DAY PRIOR TO YOUR TEST Rybelsus (semaglutide) Adlyxin (lixisenatide) Victoza (liraglutide) Byetta (exanatide) ___________________________________________________________________________ Due to recent changes in healthcare laws, you may see the results of your imaging and laboratory studies on MyChart before your provider has had a chance to review them.  We understand that in some cases there may be results that are confusing or concerning to you. Not all laboratory results come back in the same time frame and the provider may be waiting for multiple results in order to interpret others.  Please give us  48 hours in order for your provider to thoroughly review all the results before contacting the office for clarification of your results.   _______________________________________________________  If your blood pressure at your visit was 140/90 or greater, please contact your primary care physician to follow  up on this.  _______________________________________________________  If you are age 71 or older, your body mass index should be between 23-30. Your Body mass index is 40.24 kg/m. If this is out of the aforementioned range listed, please consider follow up with your Primary Care Provider.  If you are age 77 or younger, your body mass index should be between 19-25. Your Body mass index is 40.24 kg/m. If this is out of the aformentioned range listed, please consider follow up with your Primary Care Provider.   ________________________________________________________  The [facility] providers would like to encourage you to use MYCHART to communicate with providers for non-urgent requests or questions.  Due to long hold times on the telephone, sending your provider a message by [facility] may be a faster and more efficient way to get a response.  Please allow 48 business hours for a response.  Please remember that this is for non-urgent requests.  _______________________________________________________  [facility] is using a team-based approach to care.  Your team is made up of your doctor and two to three APPS. Our APPS (Nurse Practitioners and Physician Assistants) work with your physician to ensure care continuity for you. They are fully qualified to address your health concerns and develop a treatment plan. They communicate directly with your gastroenterologist to care for you. Seeing the Advanced Practice Practitioners on your physician's team can help you by facilitating care more promptly, often allowing for earlier appointments, access to diagnostic testing, procedures, and other specialty referrals.   Thank you for trusting me with your gastrointestinal care. Deanna May, FNP-C

## 2024-09-17 NOTE — Progress Notes (Addendum)
 Chief Complaint:follow-up cirrhosis Primary GI Doctor: Dr. San  HPI: 63 y.o. female with a past medical history of diabetes, lung CA, tobacco use disorder, COPD, allergic rhinitis, HTN, osteoarthritis and others listed below presents for follow-up on cirrhosis.  02/27/24 Patient last seen in GI office by Alan, [place] for evaluation of left-sided abdominal pain and cirrhosis. Abd u/s unremarkable.  09/07/2024 seen by cardiology with c/o chronic shortness of breath. Plan: Repeat coronary CTA with beta-blocker prior per protocol.  If she has not had any significant increase in CAD then her symptoms are likely related to obesity, deconditioning and pneumonectomy   Interval History  Patient presents for follow-up for cirrhosis. Overall, doing well. Her only complaint is of right flank pain and flatulence. The pain radiates to her back.  She takes OTC tylenol  prn and warm compress which helps. She also notes low back issues, but has not seen anyone for this in a few years. Also notes RU lobectomy in 2022 and she feels Simmie Camerer be related. Not affected with bowel movements or eating.  Patient denies GERD or dysphagia. Appetite good. No nausea or vomiting. Weight stable.  Trace edema lower extremities after standing during day.  Patient has regular BM everyday. No blood in stool. She does note a lot of flatulence. She eats a lot of leafy greens.   She reports her cardiologist wants her to start Mediterranean diet which she plans to do after the holidays.   Stopped smoking in 2019.  She takes 1 shot maybe once to twice a year for special occassion.  Patient on baby ASA 81mg  po daily.  History of COPD. Not on oxygen.   RELEVANT GI HISTORY, IMAGING AND LABS: Results   LABS H. pylori eradication study: Negative (09/2021)   RADIOLOGY CT Abdomen: Abnormal liver, likely fatty liver (2022) Ultrasound Abdomen: Nodular liver, cirrhosis (2022) US  ABD: No acute abnormality identified. Increased  echotexture of the liver. (03/2024)   DIAGNOSTIC REPORTS Colonoscopy: Large polyps, AVM, diverticulosis (2022) Endoscopy: Small varices (2022)     - Colonoscopy (2008): Single polyp per patient. No report available for review - Colonoscopy (05/2020): Tortuous colon. 10 subcentimeter tubular adenomas and sessile serrated polyps. Multiple rectal and rectosigmoid hyperplastic polyps. Sigmoid diverticulosis, nodular ICV (path: Benign), Ascending colon AVM. Normal TI. Recommended repeat in 1 year  01/27/2021 echocardiogram EF 55 to 60% unremarkable valves   Wt Readings from Last 3 Encounters:  09/17/24 220 lb (99.8 kg)  09/07/24 224 lb (101.6 kg)  06/20/24 223 lb (101.2 kg)    Past Medical History:  Diagnosis Date   Arthritis    Cancer (HCC)    Coronary artery disease    mild by CT 02/05/21   Diabetes mellitus without complication (HCC)    Hypertension    states under control with med., has been on med. since 2011   Wears dentures    upper    Past Surgical History:  Procedure Laterality Date   BREAST BIOPSY Left    BRONCHIAL BIOPSY  03/09/2021   Procedure: BRONCHIAL BIOPSIES;  Surgeon: Shelah Lamar RAMAN, MD;  Location: [facility] ENDOSCOPY;  Service: Pulmonary;;   BRONCHIAL BRUSHINGS  03/09/2021   Procedure: BRONCHIAL BRUSHINGS;  Surgeon: Shelah Lamar RAMAN, MD;  Location: [facility] ENDOSCOPY;  Service: Pulmonary;;   BRONCHIAL NEEDLE ASPIRATION BIOPSY  03/09/2021   Procedure: BRONCHIAL NEEDLE ASPIRATION BIOPSIES;  Surgeon: Shelah Lamar RAMAN, MD;  Location: [facility] ENDOSCOPY;  Service: Pulmonary;;   BUNIONECTOMY Bilateral 1994   CHOLECYSTECTOMY  01/17/2012   Procedure: LAPAROSCOPIC  CHOLECYSTECTOMY;  Surgeon: Vicenta DELENA Poli, MD;  Location: [facility] OR;  Service: General;  Laterality: N/A;   FIDUCIAL MARKER PLACEMENT  03/09/2021   Procedure: FIDUCIAL MARKER PLACEMENT;  Surgeon: Shelah Lamar RAMAN, MD;  Location: [facility] ENDOSCOPY;  Service: Pulmonary;;   INTERCOSTAL NERVE BLOCK Right 08/14/2021   Procedure: INTERCOSTAL  NERVE BLOCK;  Surgeon: Kerrin Elspeth BROCKS, MD;  Location: [facility] OR;  Service: Thoracic;  Laterality: Right;   NODE DISSECTION Right 08/14/2021   Procedure: NODE DISSECTION;  Surgeon: Kerrin Elspeth BROCKS, MD;  Location: [facility] OR;  Service: Thoracic;  Laterality: Right;   PUBOVAGINAL SLING  03/05/2003   right upper lobectomy  08/14/2021   SHOULDER ARTHROSCOPY WITH DISTAL CLAVICLE RESECTION Right 01/26/2016   Procedure: SHOULDER ARTHROSCOPY WITH DISTAL CLAVICLE RESECTION;  Surgeon: Eva Herring, MD;  Location: [facility];  Service: Orthopedics;  Laterality: Right;   SHOULDER ARTHROSCOPY WITH SUBACROMIAL DECOMPRESSION Right 01/26/2016   Procedure: SHOULDER ARTHROSCOPY WITH SUBACROMIAL DECOMPRESSION DEBRIDEMENT;  Surgeon: Eva Herring, MD;  Location: [facility];  Service: Orthopedics;  Laterality: Right;   TONSILLECTOMY  1975   TOOTH EXTRACTION N/A 12/23/2023   Procedure: DENTAL RESTORATION/EXTRACTIONS;  Surgeon: Sheryle Hamilton, DMD;  Location: [facility] OR;  Service: Oral Surgery;  Laterality: N/A;   TOTAL VAGINAL HYSTERECTOMY  03/05/2003   TUBAL LIGATION  1981   VAGINAL HYSTERECTOMY  2004   partial    VIDEO BRONCHOSCOPY WITH ENDOBRONCHIAL NAVIGATION N/A 03/09/2021   Procedure: VIDEO BRONCHOSCOPY WITH ENDOBRONCHIAL NAVIGATION;  Surgeon: Shelah Lamar RAMAN, MD;  Location: [facility] ENDOSCOPY;  Service: Pulmonary;  Laterality: N/A;    Current Outpatient Medications  Medication Sig Dispense Refill   Accu-Chek Softclix Lancets lancets Use as instructed 100 each 12   acetaminophen  (TYLENOL ) 500 MG tablet Take 500 mg by mouth every 6 (six) hours as needed for mild pain or moderate pain.     aspirin  EC 81 MG tablet Take 1 tablet (81 mg total) by mouth daily. Swallow whole. 90 tablet 3   atenolol -chlorthalidone  (TENORETIC ) 50-25 MG tablet TAKE 1 TABLET BY MOUTH ONCE DAILY 90 tablet 3   blood glucose meter kit and supplies KIT Inject 1 each into the skin as directed. Use meter once daily  to measure blood glucose. 1 each 0   Cholecalciferol  (VITAMIN D3) 2000 UNITS TABS Take 2,000 Units by mouth every morning.     conjugated estrogens  (PREMARIN ) vaginal cream Place 1 Applicatorful vaginally daily. 42.5 g 12   empagliflozin  (JARDIANCE ) 10 MG TABS tablet Take 1 tablet (10 mg total) by mouth daily. 30 tablet 11   glucose blood (ACCU-CHEK GUIDE TEST) test strip Use as instructed 100 each 12   insulin  glargine (LANTUS  SOLOSTAR) 100 UNIT/ML Solostar Pen INJECT 10 UNITS SUBCUTANEOUSLY ONCE DAILY 15 mL 3   Insulin  Pen Needle (RELION PEN NEEDLES) 32G X 4 MM MISC USE 1 NEEDLE DAILY AS DIRECTED 1000 each 5   metoprolol  tartrate (LOPRESSOR ) 50 MG tablet Take 1 tablet 2 hours prior to cardiac ct 1 tablet 0   Na Sulfate-K Sulfate-Mg Sulfate concentrate (SUPREP) 17.5-3.13-1.6 GM/177ML SOLN Take 1 kit (354 mLs total) by mouth once for 1 dose. 354 mL 0   nitroGLYCERIN  (NITROSTAT ) 0.4 MG SL tablet Place 0.4 mg under the tongue every 5 (five) minutes as needed for chest pain.     potassium chloride  SA (KLOR-CON  M) 20 MEQ tablet Take 2 tablets (40 mEq total) by mouth daily. 180 tablet 3   rosuvastatin  (CRESTOR ) 10 MG tablet Take 1 tablet (10  mg total) by mouth daily. 90 tablet 3   Tiotropium Bromide Monohydrate  (SPIRIVA  RESPIMAT) 2.5 MCG/ACT AERS Inhale 2 puffs into the lungs daily. 1 each 11   No current facility-administered medications for this visit.    Allergies as of 09/17/2024 - Review Complete 09/17/2024  Allergen Reaction Noted   Percocet [oxycodone -acetaminophen ] Other (See Comments) 01/08/2019   Clotrimazole  Rash 02/15/2023    Family History  Problem Relation Age of Onset   Heart disease Mother    Hyperlipidemia Mother    Hypertension Mother    Heart failure Mother    Liver cancer Father    Cancer Father        lung and liver   Diabetes type II Sister    Breast cancer Sister    Asthma Sister    COPD Sister    Hypertension Sister    Cancer Paternal Aunt        lung    Diabetes type II Brother    Hypertension Brother    Diabetes Brother    Asthma Brother    Hypertension Other    Diabetes Other    Asthma Other    Cancer Other        lung   Breast cancer Niece    Colon polyps Neg Hx    Esophageal cancer Neg Hx    Rectal cancer Neg Hx    Stomach cancer Neg Hx     Review of Systems:    Constitutional: No weight loss, fever, chills, weakness or fatigue HEENT: Eyes: No change in vision               Ears, Nose, Throat:  No change in hearing or congestion Skin: No rash or itching Cardiovascular: No chest pain, chest pressure or palpitations   Respiratory: No SOB or cough Gastrointestinal: See HPI and otherwise negative Genitourinary: No dysuria or change in urinary frequency Neurological: No headache, dizziness or syncope Musculoskeletal: No new muscle or joint pain Hematologic: No bleeding or bruising Psychiatric: No history of depression or anxiety    Physical Exam:  Vital signs: BP 118/68   Pulse 76   Ht 5' 2 (1.575 m)   Wt 220 lb (99.8 kg)   SpO2 94%   BMI 40.24 kg/m   Constitutional:   Pleasant female appears to be in NAD, Well developed, Well nourished, alert and cooperative Eyes:   PEERL, EOMI. No icterus. Conjunctiva pink. Neck:  Supple Throat: Oral cavity and pharynx without inflammation, swelling or lesion.  Respiratory: Respirations even and unlabored. Wheezes noted in bilateral lower lobes. No crackles, or rhonchi.  Cardiovascular: Normal S1, S2. Regular rate and rhythm. No peripheral edema, cyanosis or pallor.  Gastrointestinal:  Soft, nondistended, nontender. No rebound or guarding. Normal bowel sounds. No appreciable masses or hepatomegaly. Rectal:  Not performed.  Msk:  Symmetrical without gross deformities. Without edema, no deformity or joint abnormality.  Neurologic:  Alert and  oriented x4;  grossly normal neurologically. No asterixis.  Skin:   Dry and intact without significant lesions or rashes.  RELEVANT LABS  AND IMAGING: CBC    Latest Ref Rng & Units 09/11/2024    8:36 AM 06/13/2024   10:19 AM 03/21/2024   11:09 AM  CBC  WBC 3.4 - 10.8 x10E3/uL 7.4  6.2  7.3   Hemoglobin 11.1 - 15.9 g/dL 83.3  83.2  83.5   Hematocrit 34.0 - 46.6 % 52.7  50.0  51.3   Platelets 150 - 450 x10E3/uL 224  179  205      CMP     Latest Ref Rng & Units 09/11/2024    8:36 AM 06/13/2024   10:19 AM 03/21/2024   11:09 AM  CMP  Glucose 70 - 99 mg/dL 881  857  816   BUN 8 - 27 mg/dL 12  10  10    Creatinine 0.57 - 1.00 mg/dL 9.32  9.41  9.35   Sodium 134 - 144 mmol/L 144  139  141   Potassium 3.5 - 5.2 mmol/L 3.7  3.3  4.0   Chloride 96 - 106 mmol/L 99  102  102   CO2 20 - 29 mmol/L 24  30  23    Calcium  8.7 - 10.3 mg/dL 9.3  9.4  9.2   Total Protein 6.5 - 8.1 g/dL  7.2  6.6   Total Bilirubin 0.0 - 1.2 mg/dL  0.7  0.6   Alkaline Phos 38 - 126 U/L  88  106   AST 15 - 41 U/L  39  34   ALT 0 - 44 U/L  46  39      Lab Results  Component Value Date   TSH 1.270 03/04/2023   Lab Results  Component Value Date   INR 1.2 (H) 02/27/2024   INR 1.2 08/12/2021   INR 1.2 (H) 06/26/2021     MELD 3.0: 9 at 02/27/2024 11:20 AM MELD-Na: 8 at 02/27/2024 11:20 AM Calculated from: Serum Creatinine: 0.72 mg/dL (Using min of 1 mg/dL) at 5/71/7974 88:79 AM Serum Sodium: 139 mEq/L (Using max of 137 mEq/L) at 02/27/2024 11:20 AM Total Bilirubin: 0.9 mg/dL (Using min of 1 mg/dL) at 5/71/7974 88:79 AM Serum Albumin: 4.1 g/dL (Using max of 3.5 g/dL) at 5/71/7974 88:79 AM INR(ratio): 1.2 ratio at 02/27/2024 11:20 AM Age at listing (hypothetical): 63 years Sex: Female at 02/27/2024 11:20 AM    Assessment/Plan: Encounter Diagnoses  Name Primary?   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 Bili 0.7, INR 1.2 US  ABD: No acute  abnormality identified. Increased echotexture of the liver. (03/2024) Serologic workup: 07/2021 unremarkable Ascites:   no history Varices screening / surveillance EGD:    Last EGD 10/22 grade 1 varices found in lower third of the esophagus small in size, mild portal hypertensive gastropathy, positive H. pylori gastritis recall 2 years. Not on prophylaxis   --Nutrition and low sodium diet discussed with patient and information given. Can refer to dietitian if needed. -INR/PT to update MELD -US  Abd complete for HCC screening  -Recommended 30 minutes of aerobic and resistance exercise 3 days/week  -Schedule EGD for varices screening in [facility] with Dr. San. The risks and benefits of EGD with possible biopsies and esophageal dilation were discussed with the patient who agrees to proceed.  Personal history of tubular adenomatous polyps/Personal history of colonic AVMs, no hematochezia or changes in bowel habits  -Schedule for a colonoscopy in [facility] with Dr. San. The risks and benefits of colonoscopy with possible polypectomy / biopsies were discussed and the patient agrees to proceed.   Lung cancer diagnosed 03/2021 right upper lung mass History of COPD Quit smoking 2019 Status post right upper lobectomy and lymph node dissection Dr. Kerrin 08/14/2021 Following Dr. Shelah pulmonary clinic, Dr. Gatha in oncology Not  on oxygen  Type 2 diabetes insulin -dependent   CAD by CTA April 2022 Follows with Dr. Alveta, last seen 09/2024 2022 echocardiogram normal ejection fraction and unremarkable valves C/o chronic SOB CTA ordered for tomorrow, if abnormal Aune Adami need to reschedule procedures  ADDENDUM: MELD 3.0: 9 at 02/27/2024 11:20 AM MELD-Na: 8 at 02/27/2024 11:20 AM Calculated from: Serum Creatinine: 0.72 mg/dL (Using min of 1 mg/dL) at 5/71/7974 88:79 AM Serum Sodium: 139 mEq/L (Using max of 137 mEq/L) at 02/27/2024 11:20 AM Total Bilirubin: 0.9 mg/dL (Using min of 1 mg/dL) at  5/71/7974 88:79 AM Serum Albumin: 4.1 g/dL (Using max of 3.5 g/dL) at 5/71/7974 88:79 AM INR(ratio): 1.2 ratio at 02/27/2024 11:20 AM Age at listing (hypothetical): 63 years Sex: Female at 02/27/2024 11:20 AM     Thank you for the courtesy of this consult. Please call me with any questions or concerns.   Tiwanna Tuch, FNP-C [facility] 09/17/2024, 10:34 AM  Cc: Cleotilde Perkins, DO

## 2024-09-17 NOTE — Telephone Encounter (Signed)
 Reaching out to patient to offer assistance regarding upcoming cardiac imaging study; pt verbalizes understanding of appt date/time, parking situation and where to check in, pre-test NPO status and medications ordered, and verified current allergies; name and call back number provided for further questions should they arise Sid Seats RN Navigator Cardiac Imaging [facility] [phone] office [phone] cell

## 2024-09-18 ENCOUNTER — Ambulatory Visit (HOSPITAL_COMMUNITY)
Admission: RE | Admit: 2024-09-18 | Discharge: 2024-09-18 | Disposition: A | Discharge status: Home / Self Care | Source: Ambulatory Visit | Attending: Internal Medicine | Admitting: Internal Medicine

## 2024-09-18 DIAGNOSIS — I25118 Atherosclerotic heart disease of native coronary artery with other forms of angina pectoris: Secondary | ICD-10-CM | POA: Insufficient documentation

## 2024-09-18 DIAGNOSIS — R0609 Other forms of dyspnea: Secondary | ICD-10-CM | POA: Insufficient documentation

## 2024-09-18 MED ORDER — NITROGLYCERIN 0.4 MG SL SUBL
0.8000 mg | SUBLINGUAL_TABLET | Freq: Once | SUBLINGUAL | Status: AC
  Administered 2024-09-18: 0.8 mg via SUBLINGUAL

## 2024-09-18 MED ORDER — IOHEXOL 350 MG/ML SOLN
100.0000 mL | Freq: Once | INTRAVENOUS | Status: AC | PRN
  Administered 2024-09-18: 100 mL via INTRAVENOUS

## 2024-09-18 MED ORDER — DILTIAZEM HCL 25 MG/5ML IV SOLN
10.0000 mg | INTRAVENOUS | Status: AC | PRN
  Administered 2024-09-18 (×2): 10 mg via INTRAVENOUS

## 2024-09-19 ENCOUNTER — Other Ambulatory Visit: Payer: Self-pay | Admitting: Cardiology

## 2024-09-19 ENCOUNTER — Ambulatory Visit (HOSPITAL_COMMUNITY)
Admission: RE | Admit: 2024-09-19 | Discharge: 2024-09-19 | Disposition: A | Discharge status: Home / Self Care | Source: Ambulatory Visit | Attending: Cardiology | Admitting: Cardiology

## 2024-09-19 DIAGNOSIS — R931 Abnormal findings on diagnostic imaging of heart and coronary circulation: Secondary | ICD-10-CM | POA: Diagnosis not present

## 2024-09-20 ENCOUNTER — Telehealth: Payer: Self-pay | Admitting: Internal Medicine

## 2024-09-20 ENCOUNTER — Ambulatory Visit: Payer: Self-pay | Admitting: Internal Medicine

## 2024-09-20 NOTE — Telephone Encounter (Signed)
 The patient has been notified of the result and verbalized understanding.  All questions (if any) were answered. Brittlyn Cloe Chauvigne, RN 09/20/2024 4:46 PM

## 2024-09-20 NOTE — Telephone Encounter (Signed)
 Patient is requesting to speak with a nurse in regard to recent CT results. Patient has been made aware that the provider has not read the results yet. Please advise.

## 2024-09-23 NOTE — Progress Notes (Signed)
 Agree with the assessment and plan as outlined by [facility], FNP-C.  Carlitos Bottino, DO, [facility]

## 2024-09-24 DIAGNOSIS — E119 Type 2 diabetes mellitus without complications: Secondary | ICD-10-CM | POA: Diagnosis not present

## 2024-09-24 DIAGNOSIS — H25813 Combined forms of age-related cataract, bilateral: Secondary | ICD-10-CM | POA: Diagnosis not present

## 2024-09-24 DIAGNOSIS — H04123 Dry eye syndrome of bilateral lacrimal glands: Secondary | ICD-10-CM | POA: Diagnosis not present

## 2024-09-24 LAB — OPHTHALMOLOGY REPORT-SCANNED

## 2024-09-25 ENCOUNTER — Telehealth: Payer: Self-pay | Admitting: Gastroenterology

## 2024-09-25 ENCOUNTER — Ambulatory Visit (HOSPITAL_COMMUNITY)
Admission: RE | Admit: 2024-09-25 | Discharge: 2024-09-25 | Disposition: A | Discharge status: Home / Self Care | Source: Ambulatory Visit | Attending: Internal Medicine | Admitting: Internal Medicine

## 2024-09-25 DIAGNOSIS — Z860100 Personal history of colon polyps, unspecified: Secondary | ICD-10-CM | POA: Diagnosis present

## 2024-09-25 DIAGNOSIS — Z8719 Personal history of other diseases of the digestive system: Secondary | ICD-10-CM | POA: Diagnosis present

## 2024-09-25 DIAGNOSIS — K7460 Unspecified cirrhosis of liver: Secondary | ICD-10-CM | POA: Insufficient documentation

## 2024-09-25 NOTE — Telephone Encounter (Signed)
 Reviewed CTA- Cardiovascular: There are no significant extracardiac vascular findings.

## 2024-09-26 ENCOUNTER — Other Ambulatory Visit: Payer: Self-pay

## 2024-09-26 ENCOUNTER — Telehealth: Payer: Self-pay

## 2024-09-26 DIAGNOSIS — K7460 Unspecified cirrhosis of liver: Secondary | ICD-10-CM

## 2024-09-26 NOTE — Telephone Encounter (Signed)
 I called the patient and was able to give her the CT results with Dr. Ali recommendations. Per Dr. Kriste, Diffuse coronary disease with moderate stenosis, CADRADS=3. FFR did not show any significant obstruction. Coronary calcium  score of 670. This was 97th percentile for age-, sex, and race-matched controls. Normal coronary origin with right dominance. The radiologist also read your chest CT which shows a stable lung nodule. I will defer to your primary care physician for further follow-up and management for this. Symptoms are likely noncardiac. Please continue aspirin  and statin.. Patient demonstrated understanding.

## 2024-09-26 NOTE — Telephone Encounter (Signed)
 Patient returned called. Advised patient lab orders are in.

## 2024-09-26 NOTE — Telephone Encounter (Signed)
-----   Message from Cathryne PARAS May sent at 09/25/2024  5:01 PM EST ----- Karna- We need to add pt/inr to her labs for cirrhosis. She can do when available or in area  [place], NP

## 2024-10-01 ENCOUNTER — Other Ambulatory Visit

## 2024-10-01 ENCOUNTER — Ambulatory Visit: Payer: Self-pay | Admitting: Gastroenterology

## 2024-10-01 DIAGNOSIS — K7460 Unspecified cirrhosis of liver: Secondary | ICD-10-CM

## 2024-10-01 LAB — PROTIME-INR
INR: 1.2 ratio — ABNORMAL HIGH (ref 0.8–1.0)
Prothrombin Time: 12.6 s (ref 9.6–13.1)

## 2024-10-08 ENCOUNTER — Ambulatory Visit: Admitting: Student

## 2024-10-09 ENCOUNTER — Encounter: Payer: Self-pay | Admitting: Student

## 2024-10-09 ENCOUNTER — Other Ambulatory Visit: Payer: Self-pay | Admitting: Student

## 2024-10-09 ENCOUNTER — Ambulatory Visit: Admitting: Student

## 2024-10-09 VITALS — BP 118/66 | HR 81 | Wt 219.0 lb

## 2024-10-09 DIAGNOSIS — E041 Nontoxic single thyroid nodule: Secondary | ICD-10-CM

## 2024-10-09 DIAGNOSIS — Z23 Encounter for immunization: Secondary | ICD-10-CM | POA: Diagnosis not present

## 2024-10-09 DIAGNOSIS — E119 Type 2 diabetes mellitus without complications: Secondary | ICD-10-CM

## 2024-10-09 DIAGNOSIS — C3411 Malignant neoplasm of upper lobe, right bronchus or lung: Secondary | ICD-10-CM | POA: Diagnosis not present

## 2024-10-09 DIAGNOSIS — E785 Hyperlipidemia, unspecified: Secondary | ICD-10-CM

## 2024-10-09 DIAGNOSIS — B3731 Acute candidiasis of vulva and vagina: Secondary | ICD-10-CM | POA: Diagnosis not present

## 2024-10-09 DIAGNOSIS — Z794 Long term (current) use of insulin: Secondary | ICD-10-CM | POA: Diagnosis not present

## 2024-10-09 DIAGNOSIS — E876 Hypokalemia: Secondary | ICD-10-CM

## 2024-10-09 LAB — POCT GLYCOSYLATED HEMOGLOBIN (HGB A1C): HbA1c, POC (controlled diabetic range): 7.2 % — AB (ref 0.0–7.0)

## 2024-10-09 MED ORDER — FLUCONAZOLE 150 MG PO TABS: 150.0000 mg | ORAL_TABLET | Freq: Once | ORAL | 0 refills | Status: AC

## 2024-10-09 MED ORDER — NYSTATIN 100000 UNIT/GM EX POWD: 1.0000 | Freq: Three times a day (TID) | CUTANEOUS | 0 refills | Status: AC

## 2024-10-09 MED ORDER — NYSTATIN-TRIAMCINOLONE 100000-0.1 UNIT/GM-% EX OINT: 1.0000 | TOPICAL_OINTMENT | Freq: Two times a day (BID) | CUTANEOUS | 0 refills | Status: AC

## 2024-10-09 NOTE — Assessment & Plan Note (Addendum)
 Prescribed one time dose of Diflucan   Refilled nystatin  ointment and powder If candidiasis persists may need to discuss discontinuing SGLT2i

## 2024-10-09 NOTE — Assessment & Plan Note (Addendum)
 Needs repeat CT scan of chest in 6 months (May 2026) to monitor 6 mm nodule

## 2024-10-09 NOTE — Patient Instructions (Addendum)
 It was great to see you today!   I have sent in a one time pill called Diflucan  and refilled the ointment and powder.   I have ordered a thyroid  ultrasound to monitor the nodules.   Follow up in Feb, 2026 to repeat A1c  Future Appointments  Date Time Provider Department Center  10/09/2024 11:30 AM Cleotilde Perkins, DO [facility]  10/18/2024 12:30 PM San Sandor GAILS, DO [facility]  12/10/2024 10:15 AM Loreda Hacker, DPM [facility]  06/11/2025 10:45 AM [facility] None  06/18/2025 10:45 AM Sherrod Sherrod, MD [facility] None    Please arrive 15 minutes before your appointment to ensure smooth check in process.  If you are more than 15 minutes late, you may be asked to reschedule.   Please bring a list of your medications with you to all appointments.   Please call the clinic at [phone] if your symptoms worsen or you have any concerns.  Thank you for allowing me to participate in your care, Dr. Perkins Cleotilde [facility]

## 2024-10-09 NOTE — Progress Notes (Signed)
    SUBJECTIVE:   CHIEF COMPLAINT / HPI:   Sonya Walton is a 63 y.o. female presenting for rash and result follow up.   Elevated liver enzymes - Recent gastroenterology evaluation for elevated liver enzymes - Liver ultrasound completed and negative for HCC - Implementing dietary modifications, including reducing fried foods and carbohydrates  Diabetes mellitus - Recent hemoglobin A1c of 7.2% - Home blood glucose readings typically range from 114 to 120 mg/dL - Performs regular blood glucose monitoring  Cutaneous candidiasis - Persistent skin yeast infection for approximately three months - Involvement of groin and panis  - Partial relief with prior use of niacinamide, topical creams, and powders - Continued significant pruritus, worse at night - Current use of antifungal creams and Neosporin for symptom management - Affected skin appears dark brownish in color  PERTINENT  PMH / PSH: reviewed and updated.  OBJECTIVE:   BP 118/66   Pulse 81   Wt 219 lb (99.3 kg)   SpO2 98%   BMI 40.06 kg/m   Well-appearing, no acute distress Cardio: Regular rate, regular rhythm, no murmurs on exam. Pulm: Clear, no wheezing, no crackles. No increased work of breathing Abdominal: bowel sounds present, soft, non-tender, non-distended Skin: patches of hyperpigmentation with excoriations extending in the upper inner thighs and under patient's panis   Foot Exam: No signs of infection, ulceration, calluses, skin breakdown, nail disorders.  Dorsalis pedis and posterior tibial pulses +2.   ASSESSMENT/PLAN:   Assessment & Plan Type 2 diabetes mellitus treated with insulin  (HCC) A1c today 7.2 Continue current medications  Patient is starting Mediterranean diet in the New year, sh eis interested in decreasing her insulin  if possible  Follow up in 3 months s/p lifestyle interventions.  Vaginal yeast infection Prescribed one time dose of Diflucan   Refilled nystatin  ointment and powder If  candidiasis persists may need to discuss discontinuing SGLT2i Thyroid  nodule incidentally noted on imaging study Ordered annual thyroid  ultrasound for surveillance  Encounter for immunization Received Flu shot  Malignant neoplasm of upper lobe of right lung [facility]) Needs repeat CT scan of chest in 6 months (May 2026) to monitor 6 mm nodule      Damien Pinal, DO [facility]

## 2024-10-09 NOTE — Assessment & Plan Note (Signed)
 A1c today 7.2 Continue current medications  Patient is starting Mediterranean diet in the New year, sh eis interested in decreasing her insulin  if possible  Follow up in 3 months s/p lifestyle interventions.

## 2024-10-09 NOTE — Assessment & Plan Note (Signed)
 Ordered annual thyroid  ultrasound for surveillance

## 2024-10-11 ENCOUNTER — Other Ambulatory Visit: Payer: Self-pay

## 2024-10-11 MED ORDER — NITROGLYCERIN 0.4 MG SL SUBL: 0.4000 mg | SUBLINGUAL_TABLET | SUBLINGUAL | 2 refills | Status: AC | PRN

## 2024-10-11 NOTE — Telephone Encounter (Signed)
 Patient calls nurse line requesting a refill on Nitroglycerin .   She reports no active symptoms, however forgot to ask Dr. Cleotilde at recent PCP visit.   Will forward to PCP.

## 2024-10-18 ENCOUNTER — Ambulatory Visit: Admitting: Gastroenterology

## 2024-10-18 VITALS — BP 121/76 | HR 89 | Temp 98.1°F | Resp 18 | Ht 62.0 in | Wt 220.0 lb

## 2024-10-18 DIAGNOSIS — Z8719 Personal history of other diseases of the digestive system: Secondary | ICD-10-CM

## 2024-10-18 DIAGNOSIS — K5730 Diverticulosis of large intestine without perforation or abscess without bleeding: Secondary | ICD-10-CM

## 2024-10-18 DIAGNOSIS — Z1211 Encounter for screening for malignant neoplasm of colon: Secondary | ICD-10-CM

## 2024-10-18 DIAGNOSIS — Z860101 Personal history of adenomatous and serrated colon polyps: Secondary | ICD-10-CM | POA: Diagnosis not present

## 2024-10-18 DIAGNOSIS — I8500 Esophageal varices without bleeding: Secondary | ICD-10-CM

## 2024-10-18 DIAGNOSIS — K621 Rectal polyp: Secondary | ICD-10-CM

## 2024-10-18 DIAGNOSIS — K2970 Gastritis, unspecified, without bleeding: Secondary | ICD-10-CM

## 2024-10-18 DIAGNOSIS — D129 Benign neoplasm of anus and anal canal: Secondary | ICD-10-CM

## 2024-10-18 DIAGNOSIS — D120 Benign neoplasm of cecum: Secondary | ICD-10-CM

## 2024-10-18 DIAGNOSIS — K635 Polyp of colon: Secondary | ICD-10-CM

## 2024-10-18 DIAGNOSIS — K7460 Unspecified cirrhosis of liver: Secondary | ICD-10-CM

## 2024-10-18 DIAGNOSIS — I8510 Secondary esophageal varices without bleeding: Secondary | ICD-10-CM

## 2024-10-18 DIAGNOSIS — Z860100 Personal history of colon polyps, unspecified: Secondary | ICD-10-CM

## 2024-10-18 MED ORDER — SODIUM CHLORIDE 0.9 % IV SOLN: 500.0000 mL | Freq: Once | INTRAVENOUS | Status: DC

## 2024-10-18 NOTE — Progress Notes (Signed)
 Report given to PACU, vss

## 2024-10-18 NOTE — Progress Notes (Signed)
 Pt's states no medical or surgical changes since previsit or office visit.

## 2024-10-18 NOTE — Op Note (Signed)
 [facility] Patient Name: Sonya Walton Procedure Date: 10/18/2024 11:20 AM MRN: [id number] Endoscopist: Sandor Flatter , MD, [id number] Age: 63 Referring MD:  Date of Birth: 23-Jul-1961 Gender: Female Account #: [id number] Procedure:                Colonoscopy Indications:              Surveillance: Personal history of adenomatous                            polyps on last colonoscopy 3 years ago                           Last Colonoscopy was 08/2021 and notable for 2                            small 3-4 mm sigmoid adenomas, benign rectal                            hyperplastic polyps, pandiverticulosis, sigmoid                            colon was hypertrophied with significant tortuosity                            and acute angulation single small AVM in the                            ascending colon, normal TI. Repeat in 3 years Medicines:                Monitored Anesthesia Care Procedure:                Pre-Anesthesia Assessment:                           - Prior to the procedure, a History and Physical                            was performed, and patient medications and                            allergies were reviewed. The patient's tolerance of                            previous anesthesia was also reviewed. The risks                            and benefits of the procedure and the sedation                            options and risks were discussed with the patient.                            All questions were answered, and informed consent  was obtained. Prior Anticoagulants: The patient has                            taken no anticoagulant or antiplatelet agents. ASA                            Grade Assessment: III - A patient with severe                            systemic disease. After reviewing the risks and                            benefits, the patient was deemed in satisfactory                            condition to  undergo the procedure.                           After obtaining informed consent, the colonoscope                            was passed under direct vision. Throughout the                            procedure, the patient's blood pressure, pulse, and                            oxygen saturations were monitored continuously. The                            PCF-H190TL Slim SN [id number] was introduced through                            the anus and advanced to the the cecum, identified                            by appendiceal orifice and ileocecal valve. The                            colonoscopy was performed without difficulty. The                            patient tolerated the procedure well. The quality                            of the bowel preparation was good. The ileocecal                            valve, appendiceal orifice, and rectum were                            photographed. Scope In: 11:41:52 AM Scope Out: 12:01:38 PM Scope Withdrawal Time: 0 hours 14 minutes 48 seconds  Total Procedure Duration: 0 hours  19 minutes 46 seconds  Findings:                 The perianal and digital rectal examinations were                            normal.                           A 5 mm polyp was found in the cecum. The polyp was                            sessile. The polyp was removed with a cold snare.                            Resection and retrieval were complete (jar 2).                            Estimated blood loss was minimal.                           Two sessile polyps were found in the distal rectum.                            The polyps were 3 to 4 mm in size. These polyps                            were removed with a cold snare. Resection and                            retrieval were complete (jar 3). Estimated blood                            loss was minimal.                           Four additional smaller sessile polyps were found                            in the  rectum and recto-sigmoid colon. These polyps                            were 1 to 2 mm in size. These polyps were removed                            with a cold biopsy forceps. Resection and retrieval                            were complete (jar 4). Estimated blood loss was                            minimal.  Multiple small-mouthed diverticula were found in                            the entire colon.                           The retroflexed view of the distal rectum and anal                            verge was normal and showed no anal or rectal                            abnormalities. Complications:            No immediate complications. Estimated Blood Loss:     Estimated blood loss was minimal. Impression:               - One 5 mm polyp in the cecum, removed with a cold                            snare. Resected and retrieved.                           - Two 3 to 4 mm polyps in the distal rectum,                            removed with a cold snare. Resected and retrieved.                           - Four 1 to 2 mm polyps in the rectum and at the                            recto-sigmoid colon, removed with a cold biopsy                            forceps. Resected and retrieved.                           - Diverticulosis in the entire examined colon.                           - The distal rectum and anal verge are normal on                            retroflexion view. Recommendation:           - Patient has a contact number available for                            emergencies. The signs and symptoms of potential                            delayed complications were discussed with the  patient. Return to normal activities tomorrow.                            Written discharge instructions were provided to the                            patient.                           - Resume previous diet.                           -  Continue present medications.                           - Await pathology results.                           - Repeat colonoscopy for surveillance based on                            pathology results.                           - Return to GI office PRN. Sandor Flatter, MD 10/18/2024 12:12:19 PM

## 2024-10-18 NOTE — Op Note (Signed)
 [facility] Patient Name: Sonya Walton Procedure Date: 10/18/2024 11:22 AM MRN: [id number] Endoscopist: Sandor Flatter , MD, [id number] Age: 63 Referring MD:  Date of Birth: [birth year] Gender: Female Account #: [id number] Procedure:                Upper GI endoscopy Indications:              Follow-up of esophageal varices, History of H                            pylori gastritis Medicines:                Monitored Anesthesia Care Procedure:                Pre-Anesthesia Assessment:                           - Prior to the procedure, a History and Physical                            was performed, and patient medications and                            allergies were reviewed. The patient's tolerance of                            previous anesthesia was also reviewed. The risks                            and benefits of the procedure and the sedation                            options and risks were discussed with the patient.                            All questions were answered, and informed consent                            was obtained. Prior Anticoagulants: The patient has                            taken no anticoagulant or antiplatelet agents. ASA                            Grade Assessment: III - A patient with severe                            systemic disease. After reviewing the risks and                            benefits, the patient was deemed in satisfactory                            condition to undergo the procedure.  After obtaining informed consent, the endoscope was                            passed under direct vision. Throughout the                            procedure, the patient's blood pressure, pulse, and                            oxygen saturations were monitored continuously. The                            Olympus Scope [id number] was introduced through the                            mouth, and advanced to the  second part of duodenum.                            The upper GI endoscopy was accomplished without                            difficulty. The patient tolerated the procedure                            well. Scope In: Scope Out: Findings:                 Grade I varices were found in the lower third of                            the esophagus. They were small in size.                           Diffuse mild inflammation characterized by                            congestion (edema) and erythema was found in the                            gastric body and in the gastric antrum. Biopsies                            were taken with a cold forceps for Helicobacter                            pylori testing. Estimated blood loss was minimal.                           The examined duodenum was normal. Complications:            No immediate complications. Estimated Blood Loss:     Estimated blood loss was minimal. Impression:               - Grade I esophageal varices.                           -  Gastritis. Biopsied.                           - Normal examined duodenum. Recommendation:           - Patient has a contact number available for                            emergencies. The signs and symptoms of potential                            delayed complications were discussed with the                            patient. Return to normal activities tomorrow.                            Written discharge instructions were provided to the                            patient.                           - Resume previous diet.                           - Continue present medications.                           - Await pathology results.                           - Repeat upper endoscopy in 2 years for contineud                            variceal surveillance. Sandor Flatter, MD 10/18/2024 12:06:44 PM

## 2024-10-18 NOTE — Patient Instructions (Signed)

## 2024-10-18 NOTE — Progress Notes (Signed)
 Called to room to assist during endoscopic procedure.  Patient ID and intended procedure confirmed with present staff. Received instructions for my participation in the procedure from the performing physician.

## 2024-10-18 NOTE — Progress Notes (Signed)
 [address]7 [address]  0.1 mg IV given due large amount of secretions upon assessment.  MD made aware, vss

## 2024-10-18 NOTE — Progress Notes (Signed)
 GASTROENTEROLOGY PROCEDURE H&P NOTE   Primary Care Physician: Cleotilde Perkins, DO    Reason for Procedure:  Esophageal varices screening, cirrhosis, colon polyp surveillance  Plan:    EGD, colonoscopy  Patient is appropriate for endoscopic procedure(s) in the ambulatory ([facility]) setting.  The nature of the procedure, as well as the risks, benefits, and alternatives were carefully and thoroughly reviewed with the patient. Ample time for discussion and questions allowed. The patient understood, was satisfied, and agreed to proceed. I personally addressed all patient questions and concerns.     HPI: Sonya Walton is a 63 y.o. female who presents for EGD for ongoing esophageal varices surveillance along with colonoscopy for continued polyp surveillance.  Patient was most recently seen in the Gastroenterology Clinic on 09/17/2024.  No interval change in medical history since that appointment. Please refer to that note for full details regarding GI history and clinical presentation.      - Colonoscopy (2008): Single polyp per patient. No report available for review - Colonoscopy (05/2020): Tortuous colon. 10 subcentimeter tubular adenomas and sessile serrated polyps. Multiple rectal and rectosigmoid hyperplastic polyps. Sigmoid diverticulosis, nodular ICV (path: Benign), Ascending colon AVM. Normal TI. Recommended repeat in 1 year  - EGD (08/2021): grade 1 varices found in lower third of the esophagus small in size, mild portal hypertensive gastropathy, positive H. pylori gastritis recall 2 years.  Treated with quadruple therapy - Colonoscopy (08/2021): 2 small 3-4 mm sigmoid adenomas, benign rectal hyperplastic polyps, pandiverticulosis, sigmoid colon was hypertrophied with significant tortuosity and acute angulation single small AVM in the ascending colon, normal TI.  Repeat in 3 years  Past Medical History:  Diagnosis Date   Arthritis    Cancer (HCC)    Coronary artery disease    mild  by CT 02/05/21   Diabetes mellitus without complication (HCC)    Hypertension    states under control with med., has been on med. since 2011   Wears dentures    upper    Past Surgical History:  Procedure Laterality Date   BREAST BIOPSY Left    BRONCHIAL BIOPSY  03/09/2021   Procedure: BRONCHIAL BIOPSIES;  Surgeon: Shelah Lamar RAMAN, MD;  Location: [facility] ENDOSCOPY;  Service: Pulmonary;;   BRONCHIAL BRUSHINGS  03/09/2021   Procedure: BRONCHIAL BRUSHINGS;  Surgeon: Shelah Lamar RAMAN, MD;  Location: [facility] ENDOSCOPY;  Service: Pulmonary;;   BRONCHIAL NEEDLE ASPIRATION BIOPSY  03/09/2021   Procedure: BRONCHIAL NEEDLE ASPIRATION BIOPSIES;  Surgeon: Shelah Lamar RAMAN, MD;  Location: [facility] ENDOSCOPY;  Service: Pulmonary;;   BUNIONECTOMY Bilateral 1994   CHOLECYSTECTOMY  01/17/2012   Procedure: LAPAROSCOPIC CHOLECYSTECTOMY;  Surgeon: Vicenta DELENA Poli, MD;  Location: [facility] OR;  Service: General;  Laterality: N/A;   FIDUCIAL MARKER PLACEMENT  03/09/2021   Procedure: FIDUCIAL MARKER PLACEMENT;  Surgeon: Shelah Lamar RAMAN, MD;  Location: [facility] ENDOSCOPY;  Service: Pulmonary;;   INTERCOSTAL NERVE BLOCK Right 08/14/2021   Procedure: INTERCOSTAL NERVE BLOCK;  Surgeon: Kerrin Elspeth JAYSON, MD;  Location: [facility] OR;  Service: Thoracic;  Laterality: Right;   NODE DISSECTION Right 08/14/2021   Procedure: NODE DISSECTION;  Surgeon: Kerrin Elspeth JAYSON, MD;  Location: [facility] OR;  Service: Thoracic;  Laterality: Right;   PUBOVAGINAL SLING  03/05/2003   right upper lobectomy  08/14/2021   SHOULDER ARTHROSCOPY WITH DISTAL CLAVICLE RESECTION Right 01/26/2016   Procedure: SHOULDER ARTHROSCOPY WITH DISTAL CLAVICLE RESECTION;  Surgeon: Eva Herring, MD;  Location: [facility];  Service: Orthopedics;  Laterality: Right;   SHOULDER ARTHROSCOPY  WITH SUBACROMIAL DECOMPRESSION Right 01/26/2016   Procedure: SHOULDER ARTHROSCOPY WITH SUBACROMIAL DECOMPRESSION DEBRIDEMENT;  Surgeon: Eva Herring, MD;  Location: [facility];  Service: Orthopedics;  Laterality: Right;   TONSILLECTOMY  1975   TOOTH EXTRACTION N/A 12/23/2023   Procedure: DENTAL RESTORATION/EXTRACTIONS;  Surgeon: Sheryle Hamilton, DMD;  Location: [facility] OR;  Service: Oral Surgery;  Laterality: N/A;   TOTAL VAGINAL HYSTERECTOMY  03/05/2003   TUBAL LIGATION  1981   VAGINAL HYSTERECTOMY  2004   partial    VIDEO BRONCHOSCOPY WITH ENDOBRONCHIAL NAVIGATION N/A 03/09/2021   Procedure: VIDEO BRONCHOSCOPY WITH ENDOBRONCHIAL NAVIGATION;  Surgeon: Shelah Lamar RAMAN, MD;  Location: [facility] ENDOSCOPY;  Service: Pulmonary;  Laterality: N/A;    Prior to Admission medications  Medication Sig Start Date End Date Taking? Authorizing Provider  Accu-Chek Softclix Lancets lancets Use as instructed 11/10/23   McDiarmid, Krystal BIRCH, MD  acetaminophen  (TYLENOL ) 500 MG tablet Take 500 mg by mouth every 6 (six) hours as needed for mild pain or moderate pain.    [provider]  aspirin  EC 81 MG tablet Take 1 tablet (81 mg total) by mouth daily. Swallow whole. 07/03/21   Walker, Caitlin S, NP  atenolol -chlorthalidone  (TENORETIC ) 50-25 MG tablet TAKE 1 TABLET BY MOUTH ONCE DAILY 06/21/24   Cleotilde Perkins, DO  blood glucose meter kit and supplies KIT Inject 1 each into the skin as directed. Use meter once daily to measure blood glucose. 07/20/19   Luke Vernell BRAVO, MD  Cholecalciferol  (VITAMIN D3) 2000 UNITS TABS Take 2,000 Units by mouth every morning.    [provider]  conjugated estrogens  (PREMARIN ) vaginal cream Place 1 Applicatorful vaginally daily. 08/06/24   Cleotilde Perkins, DO  empagliflozin  (JARDIANCE ) 10 MG TABS tablet Take 1 tablet (10 mg total) by mouth daily. 01/16/24   Cleotilde Perkins, DO  glucose blood (ACCU-CHEK GUIDE TEST) test strip Use as instructed 11/10/23   McDiarmid, Krystal BIRCH, MD  insulin  glargine (LANTUS  SOLOSTAR) 100 UNIT/ML Solostar Pen INJECT 10 UNITS SUBCUTANEOUSLY ONCE DAILY 07/24/24   Cleotilde Perkins, DO  Insulin  Pen Needle (RELION PEN NEEDLES) 32G X 4 MM MISC  USE 1 NEEDLE DAILY AS DIRECTED 01/16/24   Cleotilde Perkins, DO  metoprolol  tartrate (LOPRESSOR ) 50 MG tablet Take 1 tablet 2 hours prior to cardiac ct 09/07/24   Segal, Jared E, DO  nitroGLYCERIN  (NITROSTAT ) 0.4 MG SL tablet Place 1 tablet (0.4 mg total) under the tongue every 5 (five) minutes as needed for chest pain. 10/11/24   Cleotilde Perkins, DO  nystatin  (MYCOSTATIN /NYSTOP ) powder Apply 1 Application topically 3 (three) times daily. 10/09/24   Cleotilde Perkins, DO  nystatin -triamcinolone  ointment (MYCOLOG) Apply 1 Application topically 2 (two) times daily. 10/09/24   Cleotilde Perkins, DO  potassium chloride  SA (KLOR-CON  M) 20 MEQ tablet TAKE 2 TABLETS(40 MEQ) BY MOUTH DAILY 10/09/24   Cleotilde Perkins, DO  rosuvastatin  (CRESTOR ) 10 MG tablet TAKE 1 TABLET(10 MG) BY MOUTH DAILY 10/09/24   Cleotilde Perkins, DO  Tiotropium Bromide Monohydrate  (SPIRIVA  RESPIMAT) 2.5 MCG/ACT AERS Inhale 2 puffs into the lungs daily. 12/14/23   McDiarmid, Krystal BIRCH, MD  pantoprazole  (PROTONIX ) 40 MG tablet Take 1 tablet (40 mg total) by mouth daily at 12 noon. Patient not taking: Reported on 12/14/2023 01/01/12 01/17/12  Odell Celinda Balo, MD    Current Outpatient Medications  Medication Sig Dispense Refill   Accu-Chek Softclix Lancets lancets Use as instructed 100 each 12   acetaminophen  (TYLENOL ) 500 MG tablet Take 500 mg by mouth every 6 (  six) hours as needed for mild pain or moderate pain.     aspirin  EC 81 MG tablet Take 1 tablet (81 mg total) by mouth daily. Swallow whole. 90 tablet 3   atenolol -chlorthalidone  (TENORETIC ) 50-25 MG tablet TAKE 1 TABLET BY MOUTH ONCE DAILY 90 tablet 3   blood glucose meter kit and supplies KIT Inject 1 each into the skin as directed. Use meter once daily to measure blood glucose. 1 each 0   Cholecalciferol  (VITAMIN D3) 2000 UNITS TABS Take 2,000 Units by mouth every morning.     conjugated estrogens  (PREMARIN ) vaginal cream Place 1 Applicatorful vaginally daily. 42.5 g 12   empagliflozin  (JARDIANCE ) 10  MG TABS tablet Take 1 tablet (10 mg total) by mouth daily. 30 tablet 11   glucose blood (ACCU-CHEK GUIDE TEST) test strip Use as instructed 100 each 12   insulin  glargine (LANTUS  SOLOSTAR) 100 UNIT/ML Solostar Pen INJECT 10 UNITS SUBCUTANEOUSLY ONCE DAILY 15 mL 3   Insulin  Pen Needle (RELION PEN NEEDLES) 32G X 4 MM MISC USE 1 NEEDLE DAILY AS DIRECTED 1000 each 5   metoprolol  tartrate (LOPRESSOR ) 50 MG tablet Take 1 tablet 2 hours prior to cardiac ct 1 tablet 0   nitroGLYCERIN  (NITROSTAT ) 0.4 MG SL tablet Place 1 tablet (0.4 mg total) under the tongue every 5 (five) minutes as needed for chest pain. 30 tablet 2   nystatin  (MYCOSTATIN /NYSTOP ) powder Apply 1 Application topically 3 (three) times daily. 15 g 0   nystatin -triamcinolone  ointment (MYCOLOG) Apply 1 Application topically 2 (two) times daily. 30 g 0   potassium chloride  SA (KLOR-CON  M) 20 MEQ tablet TAKE 2 TABLETS(40 MEQ) BY MOUTH DAILY 180 tablet 3   rosuvastatin  (CRESTOR ) 10 MG tablet TAKE 1 TABLET(10 MG) BY MOUTH DAILY 90 tablet 3   Tiotropium Bromide Monohydrate  (SPIRIVA  RESPIMAT) 2.5 MCG/ACT AERS Inhale 2 puffs into the lungs daily. 1 each 11   Current Facility-Administered Medications  Medication Dose Route Frequency Provider Last Rate Last Admin   0.9 %  sodium chloride  infusion  500 mL Intravenous Once Euriah Matlack V, DO        Allergies as of 10/18/2024 - Review Complete 10/18/2024  Allergen Reaction Noted   Percocet [oxycodone -acetaminophen ] Other (See Comments) 01/08/2019   Clotrimazole  Rash 02/15/2023    Family History  Problem Relation Age of Onset   Heart disease Mother    Hyperlipidemia Mother    Hypertension Mother    Heart failure Mother    Liver cancer Father    Cancer Father        lung and liver   Diabetes type II Sister    Breast cancer Sister    Asthma Sister    COPD Sister    Hypertension Sister    Cancer Paternal Aunt        lung   Diabetes type II Brother    Hypertension Brother    Diabetes  Brother    Asthma Brother    Hypertension Other    Diabetes Other    Asthma Other    Cancer Other        lung   Breast cancer Niece    Colon polyps Neg Hx    Esophageal cancer Neg Hx    Rectal cancer Neg Hx    Stomach cancer Neg Hx     Social History   Socioeconomic History   Marital status: Widowed    Spouse name: Not on file   Number of children: Not on file   Years  of education: Not on file   Highest education level: 10th grade  Occupational History   Not on file  Tobacco Use   Smoking status: Former    Current packs/day: 0.00    Average packs/day: 1 pack/day for 37.0 years (37.0 ttl pk-yrs)    Types: Cigarettes    Start date: 11/01/1981    Quit date: 01/09/2018    Years since quitting: 6.7    Passive exposure: Past   Smokeless tobacco: Never   Tobacco comments:    1 ppd max use.  Remains quit since 2019  Vaping Use   Vaping status: Never Used  Substance and Sexual Activity   Alcohol use: Yes    Alcohol/week: 0.0 standard drinks of alcohol    Comment: rarely   Drug use: No   Sexual activity: Not Currently    Partners: Male    Birth control/protection: Surgical  Other Topics Concern   Not on file  Social History Narrative   Previous [facility] patient, last seen 05/15/12      Works fulltime at [employer] at [employer]).  Completed some high school   Currently separated   Lives with her ssiter, Lucienne, [place]            Social Drivers of Health   Tobacco Use: Medium Risk (10/09/2024)   Patient History    Smoking Tobacco Use: Former    Smokeless Tobacco Use: Never    Passive Exposure: Past  Programmer, Applications: Not on [employer] Insecurity: Not on file  Transportation Needs: Not on file  Physical Activity: Not on file  Stress: Not on file  Social Connections: Not on file  Intimate Partner Violence: Not on file  Depression (PHQ2-9): Low Risk (10/09/2024)   Depression (PHQ2-9)    PHQ-2 Score: 0  Alcohol Screen: Not on file   Housing: Not on file  Utilities: Not on file  Health Literacy: Not on file    Physical Exam: Vital signs in last 24 hours: @BP  (!) 160/86   Pulse 84   Temp 98.1 F (36.7 C) (Temporal)   Ht 5' 2 (1.575 m)   Wt 220 lb (99.8 kg)   SpO2 96%   BMI 40.24 kg/m  GEN: NAD EYE: Sclerae anicteric ENT: MMM CV: Non-tachycardic Pulm: CTA b/l GI: Soft, NT/ND NEURO:  Alert & Oriented x 3   Sandor Flatter, DO [facility]   10/18/2024 10:44 AM

## 2024-10-19 ENCOUNTER — Telehealth: Payer: Self-pay

## 2024-10-19 NOTE — Telephone Encounter (Signed)
" °  Follow up Call-     10/18/2024   10:32 AM  Call back number  Post procedure Call Back phone  # [phone]  Permission to leave phone message Yes     Patient questions:  Do you have a fever, pain , or abdominal swelling? No. Pain Score  0 *  Have you tolerated food without any problems? Yes.    Have you been able to return to your normal activities? Yes.    Do you have any questions about your discharge instructions: Diet   No. Medications  No. Follow up visit  No.  Do you have questions or concerns about your Care? No.  Actions: * If pain score is 4 or above: No action needed, pain <4.   "

## 2024-10-22 ENCOUNTER — Inpatient Hospital Stay: Admission: RE | Admit: 2024-10-22 | Discharge: 2024-10-22 | Attending: Family Medicine | Admitting: Family Medicine

## 2024-10-22 DIAGNOSIS — E041 Nontoxic single thyroid nodule: Secondary | ICD-10-CM

## 2024-10-22 LAB — SURGICAL PATHOLOGY

## 2024-10-26 ENCOUNTER — Ambulatory Visit: Payer: Self-pay | Admitting: Family Medicine

## 2024-10-31 ENCOUNTER — Ambulatory Visit: Payer: Self-pay | Admitting: Gastroenterology

## 2024-11-08 ENCOUNTER — Ambulatory Visit

## 2024-11-08 ENCOUNTER — Encounter: Payer: Self-pay | Admitting: Podiatry

## 2024-11-08 ENCOUNTER — Ambulatory Visit: Admitting: Podiatry

## 2024-11-08 DIAGNOSIS — M722 Plantar fascial fibromatosis: Secondary | ICD-10-CM | POA: Diagnosis not present

## 2024-11-08 DIAGNOSIS — M25572 Pain in left ankle and joints of left foot: Secondary | ICD-10-CM

## 2024-11-08 DIAGNOSIS — M7732 Calcaneal spur, left foot: Secondary | ICD-10-CM

## 2024-11-08 DIAGNOSIS — M7731 Calcaneal spur, right foot: Secondary | ICD-10-CM

## 2024-11-08 NOTE — Progress Notes (Signed)
 Patient presents complaining of pain at the dorsal lateral foot and ankle left and over the medial ankle along the medial malleolus right.  Does not recall any injury says she has had some chronic swelling over the medial malleolus for a while.  Some tenderness at the heels.  Has a history of plantar fasciitis.  Physical exam:  General appearance: Pleasant, and in no acute distress. AOx3.  Vascular: Pedal pulses: DP 2/4 bilaterally, PT 2/4 bilaterally. Mild edema lower legs bilaterally. Capillary fill time immediate bilaterally.  Neurological: Light touch intact feet bilaterally.  Normal Achilles reflex bilaterally.  No clonus or spasticity noted.  Negative Tinel sign tarsal tunnel and porta pedis bilaterally  Dermatologic:   Skin normal temperature bilaterally.  Skin normal color, tone, and texture bilaterally.   Musculoskeletal: Tenderness in the sinus tarsi with palpation and range of motion of the subtalar joint left.  Tenderness on posterior tibial tendon along the medial malleolus right.  Normal muscle strength lower extremity bilaterally.  Some soreness at the plantar medial aspect calcaneus at the medial plantar calcaneal tubercle bilaterally.  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  Notes any fractures or dislocations.  Kager's triangle intact bilaterally.  Normal bone density.  No evidence of bone tumors.  Diagnosis: 1.  Arthralgia subtalar joint left 2.  Calcaneal spur bilaterally  Plan: -Discussed with her the pain in the subtalar joint left and etiology and treatment.  Discussed the right foot with posterior tibial tenosynovitis.  Discussed etiology and treatment of this.  Worst pain right now and is the sinus tarsi.  Will inject the subtalar joint today.  Also discussed plantar fasciitis.  Wear good stable supportive shoes.  Recommended icing and stretching.. -injected 3cc 2:1 mixture 0.5 cc  Marcaine : Triamcinolone  40mg /82ml at subtalar joint sinus tarsi left.    Return 2 weeks follow-up injection STJ left

## 2024-11-20 ENCOUNTER — Telehealth: Payer: Self-pay

## 2024-11-20 DIAGNOSIS — E119 Type 2 diabetes mellitus without complications: Secondary | ICD-10-CM

## 2024-11-20 NOTE — Telephone Encounter (Signed)
 Patient calls nurse line in regards to Jardiance .   She reports the medication is needing a prior authorization.   Advised will forward to pharmacy team for assistance.

## 2024-11-20 NOTE — Telephone Encounter (Signed)
 Pharmacy Patient Advocate Encounter   Received notification from Pt Calls Messages that prior authorization for JARDIANCE  is required/requested.   Insurance verification completed.   The patient is insured through [facility] MEDICAID.   Per test claim: PA required; PA submitted to above mentioned insurance via Latent Key/confirmation #[id number]. Status is pending

## 2024-11-22 ENCOUNTER — Ambulatory Visit: Admitting: Podiatry

## 2024-11-22 MED ORDER — EMPAGLIFLOZIN 10 MG PO TABS: 10.0000 mg | ORAL_TABLET | Freq: Every day | ORAL | 11 refills | Status: AC

## 2024-11-22 NOTE — Telephone Encounter (Signed)
 Pharmacy Patient Advocate Encounter  Received notification from [facility] MEDICAID that Prior Authorization for JARDIANCE  10MG  has been APPROVED from 11/20/24 to 11/20/25   PA #/Case ID/Reference #: [id number]

## 2024-11-22 NOTE — Telephone Encounter (Signed)
 Called [facility].   Medication is going through insurance now, however [facility] [place] DEA is not.   Will forward to afternoon preceptor to send in for insurance coverage.

## 2024-11-24 ENCOUNTER — Other Ambulatory Visit: Payer: Self-pay | Admitting: Family Medicine

## 2024-11-24 DIAGNOSIS — J449 Chronic obstructive pulmonary disease, unspecified: Secondary | ICD-10-CM

## 2024-12-10 ENCOUNTER — Ambulatory Visit: Admitting: Podiatry

## 2025-06-11 ENCOUNTER — Other Ambulatory Visit

## 2025-06-18 ENCOUNTER — Ambulatory Visit: Admitting: Internal Medicine
